# Patient Record
Sex: Female | Born: 1940 | Race: White | Hispanic: No | Marital: Married | Smoking: Never smoker
Health system: Southern US, Community
[De-identification: ages and names within clinical notes are randomized; demographics above are authoritative.]

## PROBLEM LIST (undated history)

## (undated) DIAGNOSIS — R519 Headache, unspecified: Secondary | ICD-10-CM

## (undated) DIAGNOSIS — E7800 Pure hypercholesterolemia, unspecified: Secondary | ICD-10-CM

## (undated) DIAGNOSIS — K219 Gastro-esophageal reflux disease without esophagitis: Secondary | ICD-10-CM

## (undated) DIAGNOSIS — M1990 Unspecified osteoarthritis, unspecified site: Secondary | ICD-10-CM

## (undated) DIAGNOSIS — E039 Hypothyroidism, unspecified: Secondary | ICD-10-CM

## (undated) DIAGNOSIS — E041 Nontoxic single thyroid nodule: Secondary | ICD-10-CM

## (undated) DIAGNOSIS — F419 Anxiety disorder, unspecified: Secondary | ICD-10-CM

## (undated) DIAGNOSIS — I82409 Acute embolism and thrombosis of unspecified deep veins of unspecified lower extremity: Secondary | ICD-10-CM

## (undated) DIAGNOSIS — IMO0001 Reserved for inherently not codable concepts without codable children: Secondary | ICD-10-CM

## (undated) DIAGNOSIS — R51 Headache: Secondary | ICD-10-CM

## (undated) DIAGNOSIS — J45909 Unspecified asthma, uncomplicated: Secondary | ICD-10-CM

## (undated) DIAGNOSIS — I10 Essential (primary) hypertension: Secondary | ICD-10-CM

## (undated) DIAGNOSIS — J439 Emphysema, unspecified: Secondary | ICD-10-CM

## (undated) DIAGNOSIS — G43909 Migraine, unspecified, not intractable, without status migrainosus: Secondary | ICD-10-CM

## (undated) DIAGNOSIS — G4730 Sleep apnea, unspecified: Secondary | ICD-10-CM

## (undated) DIAGNOSIS — R609 Edema, unspecified: Secondary | ICD-10-CM

## (undated) DIAGNOSIS — Z87898 Personal history of other specified conditions: Secondary | ICD-10-CM

## (undated) DIAGNOSIS — I890 Lymphedema, not elsewhere classified: Secondary | ICD-10-CM

## (undated) HISTORY — PX: OTHER SURGICAL HISTORY: SHX169

## (undated) HISTORY — DX: Emphysema, unspecified: J43.9

## (undated) HISTORY — PX: FRACTURE SURGERY: SHX138

## (undated) HISTORY — PX: ABDOMINAL HYSTERECTOMY: SHX81

## (undated) HISTORY — PX: CARDIAC CATHETERIZATION: SHX172

## (undated) HISTORY — DX: Lymphedema, not elsewhere classified: I89.0

## (undated) HISTORY — PX: OOPHORECTOMY: SHX86

## (undated) HISTORY — PX: KNEE ARTHROSCOPY: SHX127

## (undated) HISTORY — PX: BREAST BIOPSY: SHX20

---

## 2004-06-07 HISTORY — PX: ROTATOR CUFF REPAIR: SHX139

## 2004-08-05 ENCOUNTER — Inpatient Hospital Stay: Payer: Self-pay | Admitting: Unknown Physician Specialty

## 2004-08-05 ENCOUNTER — Other Ambulatory Visit: Payer: Self-pay

## 2004-10-19 ENCOUNTER — Ambulatory Visit: Payer: Self-pay | Admitting: Internal Medicine

## 2004-12-28 ENCOUNTER — Ambulatory Visit: Payer: Self-pay | Admitting: Physician Assistant

## 2005-02-01 ENCOUNTER — Ambulatory Visit: Payer: Self-pay | Admitting: Unknown Physician Specialty

## 2005-11-03 ENCOUNTER — Ambulatory Visit: Payer: Self-pay | Admitting: Internal Medicine

## 2005-11-29 ENCOUNTER — Encounter: Payer: Self-pay | Admitting: General Practice

## 2005-12-05 ENCOUNTER — Encounter: Payer: Self-pay | Admitting: General Practice

## 2006-01-05 ENCOUNTER — Encounter: Payer: Self-pay | Admitting: General Practice

## 2006-02-05 ENCOUNTER — Encounter: Payer: Self-pay | Admitting: General Practice

## 2006-03-07 ENCOUNTER — Encounter: Payer: Self-pay | Admitting: General Practice

## 2006-05-19 ENCOUNTER — Ambulatory Visit: Payer: Self-pay | Admitting: Unknown Physician Specialty

## 2006-10-14 ENCOUNTER — Encounter: Payer: Self-pay | Admitting: Internal Medicine

## 2006-11-06 ENCOUNTER — Encounter: Payer: Self-pay | Admitting: Internal Medicine

## 2006-11-22 ENCOUNTER — Ambulatory Visit: Payer: Self-pay | Admitting: Internal Medicine

## 2006-12-06 ENCOUNTER — Encounter: Payer: Self-pay | Admitting: Internal Medicine

## 2007-04-13 ENCOUNTER — Ambulatory Visit: Payer: Self-pay | Admitting: Internal Medicine

## 2007-11-23 ENCOUNTER — Ambulatory Visit: Payer: Self-pay | Admitting: Internal Medicine

## 2007-11-27 ENCOUNTER — Ambulatory Visit: Payer: Self-pay | Admitting: Unknown Physician Specialty

## 2008-03-16 ENCOUNTER — Emergency Department: Payer: Self-pay | Admitting: Internal Medicine

## 2008-03-19 ENCOUNTER — Ambulatory Visit: Payer: Self-pay | Admitting: Orthopedic Surgery

## 2008-03-21 ENCOUNTER — Ambulatory Visit: Payer: Self-pay | Admitting: Orthopedic Surgery

## 2008-03-22 ENCOUNTER — Ambulatory Visit: Payer: Self-pay | Admitting: Orthopedic Surgery

## 2008-10-31 ENCOUNTER — Ambulatory Visit: Payer: Self-pay | Admitting: Internal Medicine

## 2008-11-26 ENCOUNTER — Ambulatory Visit: Payer: Self-pay | Admitting: Internal Medicine

## 2009-11-27 ENCOUNTER — Ambulatory Visit: Payer: Self-pay | Admitting: Internal Medicine

## 2010-11-11 ENCOUNTER — Ambulatory Visit: Payer: Self-pay | Admitting: Internal Medicine

## 2010-12-17 ENCOUNTER — Ambulatory Visit: Payer: Self-pay | Admitting: Internal Medicine

## 2011-05-19 ENCOUNTER — Ambulatory Visit: Payer: Self-pay | Admitting: Internal Medicine

## 2011-07-21 ENCOUNTER — Emergency Department: Payer: Self-pay | Admitting: *Deleted

## 2011-07-21 LAB — COMPREHENSIVE METABOLIC PANEL
Albumin: 3.4 g/dL (ref 3.4–5.0)
Anion Gap: 9 (ref 7–16)
BUN: 14 mg/dL (ref 7–18)
Bilirubin,Total: 0.6 mg/dL (ref 0.2–1.0)
Co2: 28 mmol/L (ref 21–32)
Creatinine: 0.81 mg/dL (ref 0.60–1.30)
EGFR (African American): >60
EGFR (Non-African Amer.): >60
Potassium: 3.8 mmol/L (ref 3.5–5.1)
SGOT(AST): 20 U/L (ref 15–37)
Sodium: 140 mmol/L (ref 136–145)
Total Protein: 6.7 g/dL (ref 6.4–8.2)

## 2011-07-21 LAB — CBC
HGB: 14.8 g/dL (ref 12.0–16.0)
MCV: 89 fL (ref 80–100)
RBC: 4.98 10*6/uL (ref 3.80–5.20)
RDW: 13.5 % (ref 11.5–14.5)
WBC: 3.2 10*3/uL — ABNORMAL LOW (ref 3.6–11.0)

## 2011-07-21 LAB — TROPONIN I: Troponin-I: <0.02 ng/mL

## 2011-07-22 LAB — URINALYSIS, COMPLETE
Bacteria: NONE SEEN
Bilirubin,UR: NEGATIVE
Blood: NEGATIVE
Glucose,UR: NEGATIVE mg/dL (ref 0–75)
Ketone: NEGATIVE
Nitrite: NEGATIVE
Ph: 5 (ref 4.5–8.0)
Protein: NEGATIVE
RBC,UR: 2 /HPF (ref 0–5)
Specific Gravity: 1.016 (ref 1.003–1.030)
Squamous Epithelial: 1
WBC UR: 26 /HPF (ref 0–5)

## 2011-07-23 LAB — URINE CULTURE

## 2011-08-12 ENCOUNTER — Ambulatory Visit: Payer: Self-pay | Admitting: Internal Medicine

## 2011-12-21 ENCOUNTER — Ambulatory Visit: Payer: Self-pay | Admitting: Internal Medicine

## 2012-01-05 ENCOUNTER — Ambulatory Visit: Payer: Self-pay | Admitting: Surgery

## 2012-01-10 ENCOUNTER — Ambulatory Visit: Payer: Self-pay | Admitting: Surgery

## 2012-01-13 LAB — PATHOLOGY REPORT

## 2012-01-21 ENCOUNTER — Ambulatory Visit: Payer: Self-pay | Admitting: Internal Medicine

## 2012-08-01 ENCOUNTER — Ambulatory Visit: Payer: Self-pay | Admitting: Internal Medicine

## 2012-08-01 LAB — CREATININE, SERUM
Creatinine: 0.64 mg/dL (ref 0.60–1.30)
EGFR (African American): >60

## 2012-10-16 ENCOUNTER — Emergency Department: Payer: Self-pay | Admitting: Emergency Medicine

## 2012-10-16 LAB — COMPREHENSIVE METABOLIC PANEL
Albumin: 3.4 g/dL (ref 3.4–5.0)
Alkaline Phosphatase: 111 U/L (ref 50–136)
BUN: 14 mg/dL (ref 7–18)
Co2: 23 mmol/L (ref 21–32)
Creatinine: 0.85 mg/dL (ref 0.60–1.30)
Potassium: 3.6 mmol/L (ref 3.5–5.1)
SGOT(AST): 25 U/L (ref 15–37)
SGPT (ALT): 20 U/L (ref 12–78)
Total Protein: 6.4 g/dL (ref 6.4–8.2)

## 2012-10-16 LAB — TSH: Thyroid Stimulating Horm: 11.8 u[IU]/mL — ABNORMAL HIGH

## 2012-10-16 LAB — CBC
HCT: 46.1 % (ref 35.0–47.0)
MCH: 30.5 pg (ref 26.0–34.0)
MCV: 89 fL (ref 80–100)
Platelet: 210 10*3/uL (ref 150–440)
RBC: 5.21 10*6/uL — ABNORMAL HIGH (ref 3.80–5.20)
WBC: 10.2 10*3/uL (ref 3.6–11.0)

## 2012-10-16 LAB — CK TOTAL AND CKMB (NOT AT ARMC): CK-MB: 1.4 ng/mL (ref 0.5–3.6)

## 2012-10-16 LAB — T4, FREE: Free Thyroxine: 0.98 ng/dL (ref 0.76–1.46)

## 2012-11-20 ENCOUNTER — Ambulatory Visit: Payer: Self-pay | Admitting: Cardiology

## 2012-12-21 ENCOUNTER — Ambulatory Visit: Payer: Self-pay | Admitting: Internal Medicine

## 2013-06-04 ENCOUNTER — Ambulatory Visit: Payer: Self-pay | Admitting: Unknown Physician Specialty

## 2013-09-13 DIAGNOSIS — E7800 Pure hypercholesterolemia, unspecified: Secondary | ICD-10-CM | POA: Insufficient documentation

## 2013-09-13 DIAGNOSIS — M542 Cervicalgia: Secondary | ICD-10-CM | POA: Insufficient documentation

## 2013-09-13 DIAGNOSIS — M719 Bursopathy, unspecified: Secondary | ICD-10-CM | POA: Insufficient documentation

## 2013-09-13 DIAGNOSIS — M23329 Other meniscus derangements, posterior horn of medial meniscus, unspecified knee: Secondary | ICD-10-CM | POA: Insufficient documentation

## 2013-09-25 ENCOUNTER — Ambulatory Visit: Payer: Self-pay | Admitting: Specialist

## 2013-12-24 ENCOUNTER — Ambulatory Visit: Payer: Self-pay | Admitting: Internal Medicine

## 2013-12-31 DIAGNOSIS — R4701 Aphasia: Secondary | ICD-10-CM | POA: Insufficient documentation

## 2013-12-31 DIAGNOSIS — F419 Anxiety disorder, unspecified: Secondary | ICD-10-CM | POA: Insufficient documentation

## 2013-12-31 DIAGNOSIS — E669 Obesity, unspecified: Secondary | ICD-10-CM | POA: Insufficient documentation

## 2014-06-10 DIAGNOSIS — R079 Chest pain, unspecified: Secondary | ICD-10-CM | POA: Diagnosis not present

## 2014-06-14 DIAGNOSIS — I8310 Varicose veins of unspecified lower extremity with inflammation: Secondary | ICD-10-CM | POA: Diagnosis not present

## 2014-06-14 DIAGNOSIS — M79609 Pain in unspecified limb: Secondary | ICD-10-CM | POA: Diagnosis not present

## 2014-06-14 DIAGNOSIS — M7989 Other specified soft tissue disorders: Secondary | ICD-10-CM | POA: Diagnosis not present

## 2014-06-14 DIAGNOSIS — M549 Dorsalgia, unspecified: Secondary | ICD-10-CM | POA: Diagnosis not present

## 2014-06-17 DIAGNOSIS — M65811 Other synovitis and tenosynovitis, right shoulder: Secondary | ICD-10-CM | POA: Diagnosis not present

## 2014-06-21 DIAGNOSIS — M65811 Other synovitis and tenosynovitis, right shoulder: Secondary | ICD-10-CM | POA: Diagnosis not present

## 2014-06-24 DIAGNOSIS — E780 Pure hypercholesterolemia: Secondary | ICD-10-CM | POA: Diagnosis not present

## 2014-06-24 DIAGNOSIS — Z79899 Other long term (current) drug therapy: Secondary | ICD-10-CM | POA: Diagnosis not present

## 2014-06-24 DIAGNOSIS — Z8639 Personal history of other endocrine, nutritional and metabolic disease: Secondary | ICD-10-CM | POA: Diagnosis not present

## 2014-06-24 DIAGNOSIS — E538 Deficiency of other specified B group vitamins: Secondary | ICD-10-CM | POA: Diagnosis not present

## 2014-06-24 DIAGNOSIS — I10 Essential (primary) hypertension: Secondary | ICD-10-CM | POA: Diagnosis not present

## 2014-06-25 DIAGNOSIS — M65811 Other synovitis and tenosynovitis, right shoulder: Secondary | ICD-10-CM | POA: Diagnosis not present

## 2014-06-27 DIAGNOSIS — M65811 Other synovitis and tenosynovitis, right shoulder: Secondary | ICD-10-CM | POA: Diagnosis not present

## 2014-07-04 DIAGNOSIS — M65811 Other synovitis and tenosynovitis, right shoulder: Secondary | ICD-10-CM | POA: Diagnosis not present

## 2014-07-05 DIAGNOSIS — M65811 Other synovitis and tenosynovitis, right shoulder: Secondary | ICD-10-CM | POA: Diagnosis not present

## 2014-07-05 DIAGNOSIS — M7711 Lateral epicondylitis, right elbow: Secondary | ICD-10-CM | POA: Diagnosis not present

## 2014-07-08 DIAGNOSIS — M65811 Other synovitis and tenosynovitis, right shoulder: Secondary | ICD-10-CM | POA: Diagnosis not present

## 2014-07-11 DIAGNOSIS — M65811 Other synovitis and tenosynovitis, right shoulder: Secondary | ICD-10-CM | POA: Diagnosis not present

## 2014-07-16 DIAGNOSIS — M65811 Other synovitis and tenosynovitis, right shoulder: Secondary | ICD-10-CM | POA: Diagnosis not present

## 2014-07-18 DIAGNOSIS — M65811 Other synovitis and tenosynovitis, right shoulder: Secondary | ICD-10-CM | POA: Diagnosis not present

## 2014-07-26 DIAGNOSIS — M65811 Other synovitis and tenosynovitis, right shoulder: Secondary | ICD-10-CM | POA: Diagnosis not present

## 2014-08-14 DIAGNOSIS — G4733 Obstructive sleep apnea (adult) (pediatric): Secondary | ICD-10-CM | POA: Diagnosis not present

## 2014-08-14 DIAGNOSIS — Z8709 Personal history of other diseases of the respiratory system: Secondary | ICD-10-CM | POA: Diagnosis not present

## 2014-08-15 DIAGNOSIS — M65811 Other synovitis and tenosynovitis, right shoulder: Secondary | ICD-10-CM | POA: Diagnosis not present

## 2014-09-03 DIAGNOSIS — I10 Essential (primary) hypertension: Secondary | ICD-10-CM | POA: Diagnosis not present

## 2014-09-03 DIAGNOSIS — E780 Pure hypercholesterolemia: Secondary | ICD-10-CM | POA: Diagnosis not present

## 2014-09-03 DIAGNOSIS — R002 Palpitations: Secondary | ICD-10-CM | POA: Diagnosis not present

## 2014-09-03 DIAGNOSIS — R079 Chest pain, unspecified: Secondary | ICD-10-CM | POA: Diagnosis not present

## 2014-09-12 DIAGNOSIS — R002 Palpitations: Secondary | ICD-10-CM | POA: Diagnosis not present

## 2014-09-24 NOTE — Op Note (Signed)
PATIENT NAME:  Lindsey Thomas, Lindsey Thomas MR#:  [id number] DATE OF BIRTH:  Apr 23, 1941  DATE OF PROCEDURE:  01/10/2012  PREOPERATIVE DIAGNOSIS: Ventral hernia.   POSTOPERATIVE DIAGNOSIS: Abdominal wall lipoma.   PROCEDURE: Excision of abdominal wall lipoma.  SURGEON: Loreli Dollar, MD    ANESTHESIA: General.   INDICATIONS: This 74 year old female had a history of a painful bulge in the epigastrium which has been bothering her for about a year. It particularly hurts when she bends over. She has been able to feel a tender lump in the midepigastrium. On physical exam she did have findings of a palpable mass about 2.5 cm in dimension which was tender and not reducible and appeared that it was an incarcerated ventral hernia.  DESCRIPTION OF PROCEDURE: The patient was placed on the operating table in the supine position under general anesthesia. The abdomen was prepared with ChloraPrep and draped in a sterile manner.   A longitudinally oriented incision was made in the midaspect of the epigastrium and carried down through subcutaneous tissues. This incision was approximately 3 cm in length and encountered a fatty mass which was dissected free from surrounding structures, had somewhat ill-defined margins, and was dissected away from surrounding structures. It did extend down to the deep fascia and was excised. There was no palpable or visible hernia at this site but just appeared that the mass was likely a lipoma. The wound was inspected. Several small bleeding points were cauterized. The skin was then closed with running subcuticular 5-0 Monocryl running suture and Dermabond. The patient tolerated surgery satisfactorily and was then prepared for transfer to the recovery room.   ____________________________ Lenna Sciara. Rochel Brome, MD jws:drc D: 01/10/2012 08:25:36 ET T: 01/10/2012 08:41:50 ET JOB#: [id number] Loreli Dollar MD ELECTRONICALLY SIGNED 01/12/2012 8:57

## 2014-10-14 DIAGNOSIS — I10 Essential (primary) hypertension: Secondary | ICD-10-CM | POA: Diagnosis not present

## 2014-10-14 DIAGNOSIS — E780 Pure hypercholesterolemia: Secondary | ICD-10-CM | POA: Diagnosis not present

## 2014-11-22 DIAGNOSIS — H269 Unspecified cataract: Secondary | ICD-10-CM | POA: Diagnosis not present

## 2014-12-05 ENCOUNTER — Other Ambulatory Visit: Payer: Self-pay | Admitting: Internal Medicine

## 2014-12-05 DIAGNOSIS — E780 Pure hypercholesterolemia: Secondary | ICD-10-CM | POA: Diagnosis not present

## 2014-12-05 DIAGNOSIS — Z1231 Encounter for screening mammogram for malignant neoplasm of breast: Secondary | ICD-10-CM

## 2014-12-05 DIAGNOSIS — I10 Essential (primary) hypertension: Secondary | ICD-10-CM | POA: Diagnosis not present

## 2014-12-05 DIAGNOSIS — M79671 Pain in right foot: Secondary | ICD-10-CM | POA: Diagnosis not present

## 2014-12-05 DIAGNOSIS — Z0000 Encounter for general adult medical examination without abnormal findings: Secondary | ICD-10-CM | POA: Diagnosis not present

## 2014-12-11 DIAGNOSIS — M898X9 Other specified disorders of bone, unspecified site: Secondary | ICD-10-CM | POA: Diagnosis not present

## 2014-12-11 DIAGNOSIS — M216X2 Other acquired deformities of left foot: Secondary | ICD-10-CM | POA: Diagnosis not present

## 2014-12-11 DIAGNOSIS — M65872 Other synovitis and tenosynovitis, left ankle and foot: Secondary | ICD-10-CM | POA: Diagnosis not present

## 2014-12-11 DIAGNOSIS — M2012 Hallux valgus (acquired), left foot: Secondary | ICD-10-CM | POA: Diagnosis not present

## 2014-12-24 DIAGNOSIS — Z872 Personal history of diseases of the skin and subcutaneous tissue: Secondary | ICD-10-CM | POA: Diagnosis not present

## 2014-12-24 DIAGNOSIS — L821 Other seborrheic keratosis: Secondary | ICD-10-CM | POA: Diagnosis not present

## 2014-12-24 DIAGNOSIS — M898X9 Other specified disorders of bone, unspecified site: Secondary | ICD-10-CM | POA: Diagnosis not present

## 2014-12-24 DIAGNOSIS — Z1283 Encounter for screening for malignant neoplasm of skin: Secondary | ICD-10-CM | POA: Diagnosis not present

## 2014-12-24 DIAGNOSIS — M2012 Hallux valgus (acquired), left foot: Secondary | ICD-10-CM | POA: Diagnosis not present

## 2014-12-26 ENCOUNTER — Other Ambulatory Visit: Payer: Self-pay | Admitting: Internal Medicine

## 2014-12-26 ENCOUNTER — Ambulatory Visit
Admission: RE | Admit: 2014-12-26 | Discharge: 2014-12-26 | Disposition: A | Discharge status: Home / Self Care | Payer: Commercial Managed Care - HMO | Source: Ambulatory Visit | Attending: Internal Medicine | Admitting: Internal Medicine

## 2014-12-26 DIAGNOSIS — R922 Inconclusive mammogram: Secondary | ICD-10-CM | POA: Insufficient documentation

## 2014-12-26 DIAGNOSIS — Z1231 Encounter for screening mammogram for malignant neoplasm of breast: Secondary | ICD-10-CM | POA: Diagnosis not present

## 2014-12-26 DIAGNOSIS — Z13820 Encounter for screening for osteoporosis: Secondary | ICD-10-CM | POA: Diagnosis not present

## 2014-12-27 ENCOUNTER — Other Ambulatory Visit: Payer: Self-pay | Admitting: Internal Medicine

## 2014-12-27 DIAGNOSIS — Z1329 Encounter for screening for other suspected endocrine disorder: Secondary | ICD-10-CM | POA: Diagnosis not present

## 2014-12-27 DIAGNOSIS — E538 Deficiency of other specified B group vitamins: Secondary | ICD-10-CM | POA: Diagnosis not present

## 2014-12-27 DIAGNOSIS — Z79899 Other long term (current) drug therapy: Secondary | ICD-10-CM | POA: Diagnosis not present

## 2014-12-27 DIAGNOSIS — R928 Other abnormal and inconclusive findings on diagnostic imaging of breast: Secondary | ICD-10-CM

## 2014-12-27 DIAGNOSIS — N6310 Unspecified lump in the right breast, unspecified quadrant: Secondary | ICD-10-CM

## 2014-12-27 DIAGNOSIS — I10 Essential (primary) hypertension: Secondary | ICD-10-CM | POA: Diagnosis not present

## 2014-12-27 DIAGNOSIS — E559 Vitamin D deficiency, unspecified: Secondary | ICD-10-CM | POA: Diagnosis not present

## 2014-12-27 DIAGNOSIS — E780 Pure hypercholesterolemia: Secondary | ICD-10-CM | POA: Diagnosis not present

## 2015-01-01 ENCOUNTER — Ambulatory Visit
Admission: RE | Admit: 2015-01-01 | Discharge: 2015-01-01 | Disposition: A | Discharge status: Home / Self Care | Payer: Commercial Managed Care - HMO | Source: Ambulatory Visit | Attending: Internal Medicine | Admitting: Internal Medicine

## 2015-01-01 ENCOUNTER — Ambulatory Visit: Payer: Commercial Managed Care - HMO

## 2015-01-01 DIAGNOSIS — N63 Unspecified lump in breast: Secondary | ICD-10-CM | POA: Insufficient documentation

## 2015-01-01 DIAGNOSIS — R928 Other abnormal and inconclusive findings on diagnostic imaging of breast: Secondary | ICD-10-CM

## 2015-01-01 DIAGNOSIS — N6310 Unspecified lump in the right breast, unspecified quadrant: Secondary | ICD-10-CM

## 2015-01-22 DIAGNOSIS — G4733 Obstructive sleep apnea (adult) (pediatric): Secondary | ICD-10-CM | POA: Diagnosis not present

## 2015-01-22 DIAGNOSIS — J4520 Mild intermittent asthma, uncomplicated: Secondary | ICD-10-CM | POA: Diagnosis not present

## 2015-04-14 DIAGNOSIS — E041 Nontoxic single thyroid nodule: Secondary | ICD-10-CM | POA: Diagnosis not present

## 2015-04-16 DIAGNOSIS — I10 Essential (primary) hypertension: Secondary | ICD-10-CM | POA: Diagnosis not present

## 2015-04-16 DIAGNOSIS — E7800 Pure hypercholesterolemia, unspecified: Secondary | ICD-10-CM | POA: Diagnosis not present

## 2015-04-21 DIAGNOSIS — E041 Nontoxic single thyroid nodule: Secondary | ICD-10-CM | POA: Diagnosis not present

## 2015-06-25 DIAGNOSIS — M5442 Lumbago with sciatica, left side: Secondary | ICD-10-CM | POA: Diagnosis not present

## 2015-06-25 DIAGNOSIS — E041 Nontoxic single thyroid nodule: Secondary | ICD-10-CM | POA: Insufficient documentation

## 2015-06-25 DIAGNOSIS — Z79899 Other long term (current) drug therapy: Secondary | ICD-10-CM | POA: Diagnosis not present

## 2015-06-25 DIAGNOSIS — E7800 Pure hypercholesterolemia, unspecified: Secondary | ICD-10-CM | POA: Diagnosis not present

## 2015-06-25 DIAGNOSIS — F419 Anxiety disorder, unspecified: Secondary | ICD-10-CM | POA: Diagnosis not present

## 2015-06-25 DIAGNOSIS — M545 Low back pain: Secondary | ICD-10-CM | POA: Diagnosis not present

## 2015-06-25 DIAGNOSIS — R0602 Shortness of breath: Secondary | ICD-10-CM | POA: Diagnosis not present

## 2015-06-25 DIAGNOSIS — R51 Headache: Secondary | ICD-10-CM | POA: Diagnosis not present

## 2015-06-25 DIAGNOSIS — J019 Acute sinusitis, unspecified: Secondary | ICD-10-CM | POA: Diagnosis not present

## 2015-06-25 DIAGNOSIS — I10 Essential (primary) hypertension: Secondary | ICD-10-CM | POA: Diagnosis not present

## 2015-07-03 DIAGNOSIS — R0602 Shortness of breath: Secondary | ICD-10-CM | POA: Diagnosis not present

## 2015-08-21 DIAGNOSIS — G4733 Obstructive sleep apnea (adult) (pediatric): Secondary | ICD-10-CM | POA: Diagnosis not present

## 2015-08-21 DIAGNOSIS — E669 Obesity, unspecified: Secondary | ICD-10-CM | POA: Diagnosis not present

## 2015-10-13 DIAGNOSIS — F419 Anxiety disorder, unspecified: Secondary | ICD-10-CM | POA: Diagnosis not present

## 2015-10-13 DIAGNOSIS — E7800 Pure hypercholesterolemia, unspecified: Secondary | ICD-10-CM | POA: Diagnosis not present

## 2015-10-13 DIAGNOSIS — I10 Essential (primary) hypertension: Secondary | ICD-10-CM | POA: Diagnosis not present

## 2015-10-14 ENCOUNTER — Other Ambulatory Visit: Payer: Self-pay | Admitting: Internal Medicine

## 2015-10-14 DIAGNOSIS — Z87448 Personal history of other diseases of urinary system: Secondary | ICD-10-CM | POA: Diagnosis not present

## 2015-10-14 DIAGNOSIS — M5030 Other cervical disc degeneration, unspecified cervical region: Secondary | ICD-10-CM | POA: Diagnosis not present

## 2015-10-14 DIAGNOSIS — R600 Localized edema: Secondary | ICD-10-CM | POA: Diagnosis not present

## 2015-10-14 DIAGNOSIS — R109 Unspecified abdominal pain: Secondary | ICD-10-CM

## 2015-10-14 DIAGNOSIS — Z79899 Other long term (current) drug therapy: Secondary | ICD-10-CM | POA: Diagnosis not present

## 2015-10-14 DIAGNOSIS — R200 Anesthesia of skin: Secondary | ICD-10-CM | POA: Diagnosis not present

## 2015-10-16 ENCOUNTER — Other Ambulatory Visit: Payer: Self-pay | Admitting: Internal Medicine

## 2015-10-16 DIAGNOSIS — M5030 Other cervical disc degeneration, unspecified cervical region: Secondary | ICD-10-CM

## 2015-10-23 ENCOUNTER — Ambulatory Visit
Admission: RE | Admit: 2015-10-23 | Discharge: 2015-10-23 | Disposition: A | Discharge status: Home / Self Care | Payer: Commercial Managed Care - HMO | Source: Ambulatory Visit | Attending: Internal Medicine | Admitting: Internal Medicine

## 2015-10-23 DIAGNOSIS — R109 Unspecified abdominal pain: Secondary | ICD-10-CM | POA: Insufficient documentation

## 2015-10-27 DIAGNOSIS — I6523 Occlusion and stenosis of bilateral carotid arteries: Secondary | ICD-10-CM | POA: Diagnosis not present

## 2015-10-27 DIAGNOSIS — R200 Anesthesia of skin: Secondary | ICD-10-CM | POA: Diagnosis not present

## 2015-10-29 ENCOUNTER — Ambulatory Visit
Admission: RE | Admit: 2015-10-29 | Discharge: 2015-10-29 | Disposition: A | Discharge status: Home / Self Care | Payer: Commercial Managed Care - HMO | Source: Ambulatory Visit | Attending: Internal Medicine | Admitting: Internal Medicine

## 2015-10-29 ENCOUNTER — Other Ambulatory Visit: Payer: Self-pay | Admitting: Internal Medicine

## 2015-10-29 DIAGNOSIS — M79605 Pain in left leg: Secondary | ICD-10-CM | POA: Diagnosis not present

## 2015-10-29 DIAGNOSIS — R600 Localized edema: Secondary | ICD-10-CM | POA: Diagnosis not present

## 2015-10-29 DIAGNOSIS — M179 Osteoarthritis of knee, unspecified: Secondary | ICD-10-CM | POA: Diagnosis not present

## 2015-10-29 DIAGNOSIS — M7989 Other specified soft tissue disorders: Secondary | ICD-10-CM | POA: Diagnosis not present

## 2015-11-06 DIAGNOSIS — M79605 Pain in left leg: Secondary | ICD-10-CM | POA: Diagnosis not present

## 2015-11-06 DIAGNOSIS — R600 Localized edema: Secondary | ICD-10-CM | POA: Diagnosis not present

## 2015-11-07 ENCOUNTER — Ambulatory Visit
Admission: RE | Admit: 2015-11-07 | Discharge: 2015-11-07 | Disposition: A | Discharge status: Home / Self Care | Payer: Commercial Managed Care - HMO | Source: Ambulatory Visit | Attending: Internal Medicine | Admitting: Internal Medicine

## 2015-11-07 DIAGNOSIS — M47812 Spondylosis without myelopathy or radiculopathy, cervical region: Secondary | ICD-10-CM | POA: Insufficient documentation

## 2015-11-07 DIAGNOSIS — M5030 Other cervical disc degeneration, unspecified cervical region: Secondary | ICD-10-CM | POA: Diagnosis not present

## 2015-11-07 DIAGNOSIS — M50222 Other cervical disc displacement at C5-C6 level: Secondary | ICD-10-CM | POA: Diagnosis not present

## 2015-11-07 MED ORDER — GADOBENATE DIMEGLUMINE 529 MG/ML IV SOLN
15.0000 mL | Freq: Once | INTRAVENOUS | Status: AC | PRN
  Administered 2015-11-07: 15 mL via INTRAVENOUS

## 2015-11-14 DIAGNOSIS — R600 Localized edema: Secondary | ICD-10-CM | POA: Diagnosis not present

## 2015-11-14 DIAGNOSIS — M79605 Pain in left leg: Secondary | ICD-10-CM | POA: Diagnosis not present

## 2015-11-18 DIAGNOSIS — R600 Localized edema: Secondary | ICD-10-CM | POA: Diagnosis not present

## 2015-11-18 DIAGNOSIS — M79605 Pain in left leg: Secondary | ICD-10-CM | POA: Diagnosis not present

## 2015-12-01 DIAGNOSIS — H2513 Age-related nuclear cataract, bilateral: Secondary | ICD-10-CM | POA: Diagnosis not present

## 2015-12-23 ENCOUNTER — Other Ambulatory Visit: Payer: Self-pay | Admitting: Internal Medicine

## 2015-12-23 DIAGNOSIS — I878 Other specified disorders of veins: Secondary | ICD-10-CM | POA: Insufficient documentation

## 2015-12-23 DIAGNOSIS — Z1239 Encounter for other screening for malignant neoplasm of breast: Secondary | ICD-10-CM | POA: Diagnosis not present

## 2015-12-23 DIAGNOSIS — Z131 Encounter for screening for diabetes mellitus: Secondary | ICD-10-CM | POA: Diagnosis not present

## 2015-12-23 DIAGNOSIS — E041 Nontoxic single thyroid nodule: Secondary | ICD-10-CM | POA: Diagnosis not present

## 2015-12-23 DIAGNOSIS — E6609 Other obesity due to excess calories: Secondary | ICD-10-CM | POA: Diagnosis not present

## 2015-12-23 DIAGNOSIS — Z79899 Other long term (current) drug therapy: Secondary | ICD-10-CM | POA: Diagnosis not present

## 2015-12-23 DIAGNOSIS — Z1231 Encounter for screening mammogram for malignant neoplasm of breast: Secondary | ICD-10-CM

## 2015-12-23 DIAGNOSIS — E7800 Pure hypercholesterolemia, unspecified: Secondary | ICD-10-CM | POA: Diagnosis not present

## 2015-12-23 DIAGNOSIS — I10 Essential (primary) hypertension: Secondary | ICD-10-CM | POA: Diagnosis not present

## 2015-12-23 DIAGNOSIS — Z0000 Encounter for general adult medical examination without abnormal findings: Secondary | ICD-10-CM | POA: Diagnosis not present

## 2015-12-25 DIAGNOSIS — E041 Nontoxic single thyroid nodule: Secondary | ICD-10-CM | POA: Diagnosis not present

## 2015-12-25 DIAGNOSIS — E7800 Pure hypercholesterolemia, unspecified: Secondary | ICD-10-CM | POA: Diagnosis not present

## 2015-12-25 DIAGNOSIS — I10 Essential (primary) hypertension: Secondary | ICD-10-CM | POA: Diagnosis not present

## 2015-12-25 DIAGNOSIS — Z131 Encounter for screening for diabetes mellitus: Secondary | ICD-10-CM | POA: Diagnosis not present

## 2015-12-25 DIAGNOSIS — Z79899 Other long term (current) drug therapy: Secondary | ICD-10-CM | POA: Diagnosis not present

## 2015-12-26 DIAGNOSIS — M549 Dorsalgia, unspecified: Secondary | ICD-10-CM | POA: Diagnosis not present

## 2015-12-26 DIAGNOSIS — M79609 Pain in unspecified limb: Secondary | ICD-10-CM | POA: Diagnosis not present

## 2015-12-26 DIAGNOSIS — M7989 Other specified soft tissue disorders: Secondary | ICD-10-CM | POA: Diagnosis not present

## 2015-12-26 DIAGNOSIS — I8310 Varicose veins of unspecified lower extremity with inflammation: Secondary | ICD-10-CM | POA: Diagnosis not present

## 2015-12-26 DIAGNOSIS — I890 Lymphedema, not elsewhere classified: Secondary | ICD-10-CM | POA: Diagnosis not present

## 2015-12-30 DIAGNOSIS — L821 Other seborrheic keratosis: Secondary | ICD-10-CM | POA: Diagnosis not present

## 2015-12-30 DIAGNOSIS — Z1283 Encounter for screening for malignant neoplasm of skin: Secondary | ICD-10-CM | POA: Diagnosis not present

## 2015-12-30 DIAGNOSIS — Z872 Personal history of diseases of the skin and subcutaneous tissue: Secondary | ICD-10-CM | POA: Diagnosis not present

## 2016-01-06 ENCOUNTER — Ambulatory Visit
Admission: RE | Admit: 2016-01-06 | Discharge: 2016-01-06 | Disposition: A | Discharge status: Home / Self Care | Payer: Commercial Managed Care - HMO | Source: Ambulatory Visit | Attending: Internal Medicine | Admitting: Internal Medicine

## 2016-01-06 ENCOUNTER — Other Ambulatory Visit: Payer: Self-pay | Admitting: Internal Medicine

## 2016-01-06 DIAGNOSIS — Z1231 Encounter for screening mammogram for malignant neoplasm of breast: Secondary | ICD-10-CM

## 2016-01-21 DIAGNOSIS — I872 Venous insufficiency (chronic) (peripheral): Secondary | ICD-10-CM | POA: Diagnosis not present

## 2016-01-21 DIAGNOSIS — I8310 Varicose veins of unspecified lower extremity with inflammation: Secondary | ICD-10-CM | POA: Diagnosis not present

## 2016-01-21 DIAGNOSIS — M79609 Pain in unspecified limb: Secondary | ICD-10-CM | POA: Diagnosis not present

## 2016-01-21 DIAGNOSIS — M7989 Other specified soft tissue disorders: Secondary | ICD-10-CM | POA: Diagnosis not present

## 2016-01-21 DIAGNOSIS — I890 Lymphedema, not elsewhere classified: Secondary | ICD-10-CM | POA: Diagnosis not present

## 2016-01-21 DIAGNOSIS — M549 Dorsalgia, unspecified: Secondary | ICD-10-CM | POA: Diagnosis not present

## 2016-01-23 DIAGNOSIS — H2513 Age-related nuclear cataract, bilateral: Secondary | ICD-10-CM | POA: Diagnosis not present

## 2016-02-03 ENCOUNTER — Encounter: Payer: Self-pay | Admitting: *Deleted

## 2016-02-04 DIAGNOSIS — I890 Lymphedema, not elsewhere classified: Secondary | ICD-10-CM | POA: Diagnosis not present

## 2016-02-12 ENCOUNTER — Ambulatory Visit: Payer: Commercial Managed Care - HMO | Admitting: Anesthesiology

## 2016-02-12 ENCOUNTER — Encounter: Payer: Self-pay | Admitting: Anesthesiology

## 2016-02-12 ENCOUNTER — Ambulatory Visit
Admission: RE | Admit: 2016-02-12 | Discharge: 2016-02-12 | Disposition: A | Discharge status: Home / Self Care | Payer: Commercial Managed Care - HMO | Source: Ambulatory Visit | Attending: Ophthalmology | Admitting: Ophthalmology

## 2016-02-12 ENCOUNTER — Encounter
Admission: RE | Disposition: A | Discharge status: Home / Self Care | Payer: Self-pay | Source: Ambulatory Visit | Attending: Ophthalmology

## 2016-02-12 DIAGNOSIS — F419 Anxiety disorder, unspecified: Secondary | ICD-10-CM | POA: Insufficient documentation

## 2016-02-12 DIAGNOSIS — Z888 Allergy status to other drugs, medicaments and biological substances status: Secondary | ICD-10-CM | POA: Insufficient documentation

## 2016-02-12 DIAGNOSIS — G43909 Migraine, unspecified, not intractable, without status migrainosus: Secondary | ICD-10-CM | POA: Diagnosis not present

## 2016-02-12 DIAGNOSIS — J45909 Unspecified asthma, uncomplicated: Secondary | ICD-10-CM | POA: Insufficient documentation

## 2016-02-12 DIAGNOSIS — K219 Gastro-esophageal reflux disease without esophagitis: Secondary | ICD-10-CM | POA: Insufficient documentation

## 2016-02-12 DIAGNOSIS — H2512 Age-related nuclear cataract, left eye: Secondary | ICD-10-CM | POA: Insufficient documentation

## 2016-02-12 DIAGNOSIS — R0601 Orthopnea: Secondary | ICD-10-CM | POA: Diagnosis not present

## 2016-02-12 DIAGNOSIS — Z90710 Acquired absence of both cervix and uterus: Secondary | ICD-10-CM | POA: Insufficient documentation

## 2016-02-12 DIAGNOSIS — E041 Nontoxic single thyroid nodule: Secondary | ICD-10-CM | POA: Insufficient documentation

## 2016-02-12 DIAGNOSIS — Z881 Allergy status to other antibiotic agents status: Secondary | ICD-10-CM | POA: Insufficient documentation

## 2016-02-12 DIAGNOSIS — M7989 Other specified soft tissue disorders: Secondary | ICD-10-CM | POA: Diagnosis not present

## 2016-02-12 DIAGNOSIS — M1990 Unspecified osteoarthritis, unspecified site: Secondary | ICD-10-CM | POA: Diagnosis not present

## 2016-02-12 DIAGNOSIS — I10 Essential (primary) hypertension: Secondary | ICD-10-CM | POA: Diagnosis not present

## 2016-02-12 DIAGNOSIS — E7800 Pure hypercholesterolemia, unspecified: Secondary | ICD-10-CM | POA: Diagnosis not present

## 2016-02-12 DIAGNOSIS — Z885 Allergy status to narcotic agent status: Secondary | ICD-10-CM | POA: Insufficient documentation

## 2016-02-12 DIAGNOSIS — R0681 Apnea, not elsewhere classified: Secondary | ICD-10-CM | POA: Insufficient documentation

## 2016-02-12 HISTORY — DX: Sleep apnea, unspecified: G47.30

## 2016-02-12 HISTORY — DX: Headache, unspecified: R51.9

## 2016-02-12 HISTORY — DX: Edema, unspecified: R60.9

## 2016-02-12 HISTORY — DX: Hypothyroidism, unspecified: E03.9

## 2016-02-12 HISTORY — DX: Anxiety disorder, unspecified: F41.9

## 2016-02-12 HISTORY — DX: Essential (primary) hypertension: I10

## 2016-02-12 HISTORY — PX: CATARACT EXTRACTION W/PHACO: SHX586

## 2016-02-12 HISTORY — DX: Unspecified asthma, uncomplicated: J45.909

## 2016-02-12 HISTORY — DX: Reserved for inherently not codable concepts without codable children: IMO0001

## 2016-02-12 HISTORY — DX: Gastro-esophageal reflux disease without esophagitis: K21.9

## 2016-02-12 HISTORY — DX: Headache: R51

## 2016-02-12 HISTORY — DX: Unspecified osteoarthritis, unspecified site: M19.90

## 2016-02-12 HISTORY — DX: Personal history of other specified conditions: Z87.898

## 2016-02-12 SURGERY — PHACOEMULSIFICATION, CATARACT, WITH IOL INSERTION
Anesthesia: Monitor Anesthesia Care | Site: Eye | Laterality: Left | Wound class: Clean

## 2016-02-12 MED ORDER — LIDOCAINE HCL (PF) 4 % IJ SOLN
INTRAMUSCULAR | Status: AC
  Filled 2016-02-12: qty 30

## 2016-02-12 MED ORDER — SODIUM HYALURONATE 10 MG/ML IO SOLN
INTRAOCULAR | Status: DC | PRN
  Administered 2016-02-12: .85 mL via INTRAOCULAR

## 2016-02-12 MED ORDER — LIDOCAINE HCL (PF) 4 % IJ SOLN
INTRAOCULAR | Status: DC | PRN
  Administered 2016-02-12: .5 mL via OPHTHALMIC

## 2016-02-12 MED ORDER — ARMC OPHTHALMIC DILATING GEL
OPHTHALMIC | Status: AC
  Administered 2016-02-12: 1 via OPHTHALMIC
  Filled 2016-02-12: qty 0.25

## 2016-02-12 MED ORDER — TETRACAINE HCL 0.5 % OP SOLN
OPHTHALMIC | Status: AC
  Administered 2016-02-12: 1 [drp] via OPHTHALMIC
  Filled 2016-02-12: qty 2

## 2016-02-12 MED ORDER — SODIUM HYALURONATE 23 MG/ML IO SOLN
INTRAOCULAR | Status: AC
Start: 2016-02-12 — End: 2016-02-12
  Filled 2016-02-12: qty 3.6

## 2016-02-12 MED ORDER — SODIUM CHLORIDE 0.9 % IV SOLN
INTRAVENOUS | Status: DC
  Administered 2016-02-12: 07:00:00 via INTRAVENOUS

## 2016-02-12 MED ORDER — SODIUM HYALURONATE 23 MG/ML IO SOLN
INTRAOCULAR | Status: DC | PRN
  Administered 2016-02-12: .6 mL via INTRAOCULAR

## 2016-02-12 MED ORDER — POVIDONE-IODINE 5 % OP SOLN
OPHTHALMIC | Status: AC
  Administered 2016-02-12: 1 via OPHTHALMIC
  Filled 2016-02-12: qty 30

## 2016-02-12 MED ORDER — EPINEPHRINE HCL 1 MG/ML IJ SOLN
INTRAOCULAR | Status: DC | PRN
  Administered 2016-02-12: 1 mL via OPHTHALMIC

## 2016-02-12 MED ORDER — MOXIFLOXACIN HCL 0.5 % OP SOLN
OPHTHALMIC | Status: DC | PRN
  Administered 2016-02-12: 1 [drp] via OPHTHALMIC

## 2016-02-12 MED ORDER — ARMC OPHTHALMIC DILATING GEL
1.0000 "application " | OPHTHALMIC | Status: AC | PRN
  Administered 2016-02-12 (×2): 1 via OPHTHALMIC

## 2016-02-12 MED ORDER — MOXIFLOXACIN HCL 0.5 % OP SOLN: 1.0000 [drp] | OPHTHALMIC | Status: DC | PRN

## 2016-02-12 MED ORDER — EPINEPHRINE HCL 1 MG/ML IJ SOLN
INTRAMUSCULAR | Status: AC
  Filled 2016-02-12: qty 12

## 2016-02-12 MED ORDER — MOXIFLOXACIN HCL 0.5 % OP SOLN
OPHTHALMIC | Status: DC
Start: 2016-02-12 — End: 2016-02-12
  Filled 2016-02-12: qty 3

## 2016-02-12 MED ORDER — POVIDONE-IODINE 5 % OP SOLN
1.0000 "application " | Freq: Once | OPHTHALMIC | Status: AC
  Administered 2016-02-12: 1 via OPHTHALMIC

## 2016-02-12 MED ORDER — TETRACAINE HCL 0.5 % OP SOLN
1.0000 [drp] | Freq: Once | OPHTHALMIC | Status: AC
  Administered 2016-02-12: 1 [drp] via OPHTHALMIC

## 2016-02-12 MED ORDER — CARBACHOL 0.01 % IO SOLN
INTRAOCULAR | Status: DC | PRN
  Administered 2016-02-12: .5 mL via INTRAOCULAR

## 2016-02-12 MED ORDER — MIDAZOLAM HCL 2 MG/2ML IJ SOLN
INTRAMUSCULAR | Status: DC | PRN
  Administered 2016-02-12: 1 mg via INTRAVENOUS

## 2016-02-12 MED ORDER — SODIUM HYALURONATE 10 MG/ML IO SOLN
INTRAOCULAR | Status: AC
  Filled 2016-02-12: qty 5.1

## 2016-02-12 SURGICAL SUPPLY — 22 items
CANNULA ANT/CHMB 27GA (MISCELLANEOUS) ×6 IMPLANT
CUP MEDICINE 2OZ PLAST GRAD ST (MISCELLANEOUS) ×3 IMPLANT
GLOVE BIO SURGEON STRL SZ8 (GLOVE) ×3 IMPLANT
GLOVE BIOGEL M 6.5 STRL (GLOVE) ×3 IMPLANT
GLOVE SURG LX 7.5 STRW (GLOVE) ×2
GLOVE SURG LX STRL 7.5 STRW (GLOVE) ×1 IMPLANT
GOWN STRL REUS W/ TWL LRG LVL3 (GOWN DISPOSABLE) ×2 IMPLANT
GOWN STRL REUS W/TWL LRG LVL3 (GOWN DISPOSABLE) ×4
LENS IOL ACRSF IQ PC 21.5 (Intraocular Lens) ×1 IMPLANT
LENS IOL ACRYSOF IQ POST 21.5 (Intraocular Lens) ×3 IMPLANT
PACK CATARACT (MISCELLANEOUS) ×3 IMPLANT
PACK CATARACT BRASINGTON LX (MISCELLANEOUS) ×3 IMPLANT
PACK EYE AFTER SURG (MISCELLANEOUS) ×3 IMPLANT
SOL BSS BAG (MISCELLANEOUS) ×3
SOL PREP PVP 2OZ (MISCELLANEOUS) ×3
SOLUTION BSS BAG (MISCELLANEOUS) ×1 IMPLANT
SOLUTION PREP PVP 2OZ (MISCELLANEOUS) ×1 IMPLANT
SYR 3ML LL SCALE MARK (SYRINGE) ×6 IMPLANT
SYR 5ML LL (SYRINGE) ×3 IMPLANT
SYR TB 1ML 27GX1/2 LL (SYRINGE) ×3 IMPLANT
WATER STERILE IRR 250ML POUR (IV SOLUTION) ×3 IMPLANT
WIPE NON LINTING 3.25X3.25 (MISCELLANEOUS) ×3 IMPLANT

## 2016-02-12 NOTE — Anesthesia Preprocedure Evaluation (Signed)
Anesthesia Evaluation  Patient identified by MRN, date of birth, ID band Patient awake    Reviewed: Allergy & Precautions, H&P , NPO status , Patient's Chart, lab work & pertinent test results, reviewed documented beta blocker date and time   History of Anesthesia Complications Negative for: history of anesthetic complications  Airway Mallampati: II  TM Distance: >3 FB Neck ROM: full    Dental no notable dental hx. (+) Teeth Intact 2 permanent bridges:   Pulmonary neg pulmonary ROS, shortness of breath, asthma , sleep apnea (mild) , neg COPD, neg recent URI,    Pulmonary exam normal breath sounds clear to auscultation       Cardiovascular Exercise Tolerance: Good negative cardio ROS Normal cardiovascular exam Rhythm:regular Rate:Normal     Neuro/Psych negative neurological ROS  negative psych ROS   GI/Hepatic Neg liver ROS, GERD  ,  Endo/Other  negative endocrine ROS  Renal/GU negative Renal ROS  negative genitourinary   Musculoskeletal   Abdominal   Peds  Hematology negative hematology ROS (+)   Anesthesia Other Findings Past Medical History: No date: Anxiety No date: Arthritis No date: Asthma No date: Edema     Comment: feet No date: GERD (gastroesophageal reflux disease) No date: Headache     Comment: MIGRAINES No date: History of orthopnea No date: Hypertension No date: Hypothyroidism     Comment: NODULES No date: Shortness of breath dyspnea     Comment: WITH EXERTION No date: Sleep apnea     Comment: MILD, DOES NOT USE CPAP   Reproductive/Obstetrics negative OB ROS                             Anesthesia Physical Anesthesia Plan  ASA: II  Anesthesia Plan: MAC   Post-op Pain Management:    Induction:   Airway Management Planned:   Additional Equipment:   Intra-op Plan:   Post-operative Plan:   Informed Consent: I have reviewed the patients History and  Physical, chart, labs and discussed the procedure including the risks, benefits and alternatives for the proposed anesthesia with the patient or authorized representative who has indicated his/her understanding and acceptance.   Dental Advisory Given  Plan Discussed with: Anesthesiologist, CRNA and Surgeon  Anesthesia Plan Comments:         Anesthesia Quick Evaluation

## 2016-02-12 NOTE — Discharge Instructions (Signed)
Eye Surgery Discharge Instructions  Expect mild scratchy sensation or mild soreness. DO NOT RUB YOUR EYE!  The day of surgery:  Minimal physical activity, but bed rest is not required  No reading, computer work, or close hand work  No bending, lifting, or straining.  May watch TV  For 24 hours:  No driving, legal decisions, or alcoholic beverages  Safety precautions  Eat anything you prefer: It is better to start with liquids, then soup then solid foods.  _____ Eye patch should be worn until postoperative exam tomorrow.  ____ Solar shield eyeglasses should be worn for comfort in the sunlight/patch while sleeping  Resume all regular medications including aspirin or Coumadin if these were discontinued prior to surgery. You may shower, bathe, shave, or wash your hair. Tylenol may be taken for mild discomfort.  Call your doctor if you experience significant pain, nausea, or vomiting, fever > 101 or other signs of infection. [phone] or [phone] Specific instructions:  Follow-up Information    Anjanette Clydene Laming, NP .   Specialty:  Obstetrics and Gynecology Contact information: [place] [address] [phone]        Benay Pillow, MD .   Specialty:  Ophthalmology Why:  02-13-16 at 9:20 Contact information: [address] [place] [address] [phone]          Eye Surgery Discharge Instructions  Expect mild scratchy sensation or mild soreness. DO NOT RUB YOUR EYE!  The day of surgery:  Minimal physical activity, but bed rest is not required  No reading, computer work, or close hand work  No bending, lifting, or straining.  May watch TV  For 24 hours:  No driving, legal decisions, or alcoholic beverages  Safety precautions  Eat anything you prefer: It is better to start with liquids, then soup then solid foods.  _____ Eye patch should be worn until postoperative exam tomorrow.  ____ Solar  shield eyeglasses should be worn for comfort in the sunlight/patch while sleeping  Resume all regular medications including aspirin or Coumadin if these were discontinued prior to surgery. You may shower, bathe, shave, or wash your hair. Tylenol may be taken for mild discomfort.  Call your doctor if you experience significant pain, nausea, or vomiting, fever > 101 or other signs of infection. [phone] or [phone] Specific instructions:  Follow-up Information    Anjanette Clydene Laming, NP .   Specialty:  Obstetrics and Gynecology Contact information: [place] [address] [phone]        Benay Pillow, MD .   Specialty:  Ophthalmology Why:  02-13-16 at 9:20 Contact information: [address] [place] [address] [phone]

## 2016-02-12 NOTE — Progress Notes (Signed)
No complaints of pain on discharge 

## 2016-02-12 NOTE — H&P (Signed)
The History and Physical notes are on paper, have been signed, and are to be scanned. The patient remains stable and unchanged from the H&P.   Previous H&P reviewed, patient examined, and there are no changes.  Lindsey Thomas 02/12/2016 7:23 AM

## 2016-02-12 NOTE — Transfer of Care (Signed)
Immediate Anesthesia Transfer of Care Note  Patient: Lindsey Thomas  Procedure(s) Performed: Procedure(s) with comments: CATARACT EXTRACTION PHACO AND INTRAOCULAR LENS PLACEMENT (IOC) (Left) - [place] 01:30 AP% 15.2 CDE 13.71 Fluid pack lot # [id number]  Patient Location: PACU  Anesthesia Type:MAC  Level of Consciousness: awake, alert  and oriented  Airway & Oxygen Therapy: Patient Spontanous Breathing  Post-op Assessment: Report given to RN and Post -op Vital signs reviewed and stable  Post vital signs: stable  Last Vitals:  Vitals:   02/12/16 0800 02/12/16 0805  BP: 135/70 135/60  Pulse: 79   Resp:  12  Temp: 36.9 C 36.9 C    Last Pain:  Vitals:   02/12/16 0805  TempSrc: Tympanic  PainSc:          Complications: No apparent anesthesia complications

## 2016-02-12 NOTE — Op Note (Signed)
OPERATIVE NOTE  Lindsey Thomas [id number] 02/12/2016   PREOPERATIVE DIAGNOSIS:  Nuclear sclerotic cataract left eye.  H25.12   POSTOPERATIVE DIAGNOSIS:    Nuclear sclerotic cataract left eye.     PROCEDURE:  Phacoemusification with posterior chamber intraocular lens placement of the left eye   LENS:   Implant Name Type Inv. Item Serial No. Manufacturer Lot No. LRB No. Used  IMPLANT LENS - [id number] Intraocular Lens IMPLANT LENS [id number] ALCON   Left 1       [id number] 21.5   ULTRASOUND TIME: 1 minutes 30 seconds.  CDE 13.72   SURGEON:  Benay Pillow, MD, MPH   ANESTHESIA:  Topical with tetracaine drops and 2% Xylocaine jelly, augmented with 1% preservative-free intracameral lidocaine.   COMPLICATIONS:  None.   DESCRIPTION OF PROCEDURE:  The patient was identified in the holding room and transported to the operating room and placed in the supine position under the operating microscope.  The left eye was identified as the operative eye and it was prepped and draped in the usual sterile ophthalmic fashion.   A 1.0 millimeter clear-corneal paracentesis was made at the 5:00 position. 0.5 ml of preservative-free 1% lidocaine with epinephrine was injected into the anterior chamber.  The anterior chamber was filled with Healon 5 viscoelastic.  A 2.4 millimeter keratome was used to make a near-clear corneal incision at the 2:00 position.  A curvilinear capsulorrhexis was made with a cystotome and capsulorrhexis forceps.  Balanced salt solution was used to hydrodissect and hydrodelineate the nucleus.   Phacoemulsification was then used in stop and chop fashion to remove the lens nucleus and epinucleus.  The remaining cortex was then removed using the irrigation and aspiration handpiece. Healon was then placed into the capsular bag to distend it for lens placement.  A lens was then injected into the capsular bag.  The remaining viscoelastic was aspirated.    Miostat was injected since the  iris was floppy, to prevent iris prolapse.   Wounds were hydrated with balanced salt solution.  The anterior chamber was inflated to a physiologic pressure with balanced salt solution.  Intracameral vigamox 0.1 mL undiltued was injected into the eye.  No wound leaks were noted.  Topical Vigamox drops were applied to the eye.  The patient was taken to the recovery room in stable condition without complications of anesthesia or surgery  Benay Pillow 02/12/2016, 8:01 AM

## 2016-02-12 NOTE — Anesthesia Postprocedure Evaluation (Signed)
Anesthesia Post Note  Patient: Lindsey Thomas  Procedure(s) Performed: Procedure(s) (LRB): CATARACT EXTRACTION PHACO AND INTRAOCULAR LENS PLACEMENT (IOC) (Left)  Patient location during evaluation: PACU Anesthesia Type: MAC Level of consciousness: awake and alert and oriented Pain management: pain level controlled Vital Signs Assessment: post-procedure vital signs reviewed and stable Respiratory status: spontaneous breathing Cardiovascular status: stable Anesthetic complications: no    Last Vitals:  Vitals:   02/12/16 0800 02/12/16 0805  BP: 135/70 135/60  Pulse: 79   Resp:  12  Temp: 36.9 C 36.9 C    Last Pain:  Vitals:   02/12/16 0805  TempSrc: Tympanic  PainSc:                  Estill Batten

## 2016-03-08 DIAGNOSIS — Z8709 Personal history of other diseases of the respiratory system: Secondary | ICD-10-CM | POA: Diagnosis not present

## 2016-03-08 DIAGNOSIS — R05 Cough: Secondary | ICD-10-CM | POA: Diagnosis not present

## 2016-03-08 DIAGNOSIS — Z23 Encounter for immunization: Secondary | ICD-10-CM | POA: Diagnosis not present

## 2016-03-08 DIAGNOSIS — J310 Chronic rhinitis: Secondary | ICD-10-CM | POA: Diagnosis not present

## 2016-03-12 DIAGNOSIS — H2511 Age-related nuclear cataract, right eye: Secondary | ICD-10-CM | POA: Diagnosis not present

## 2016-03-16 ENCOUNTER — Encounter: Payer: Self-pay | Admitting: *Deleted

## 2016-03-18 ENCOUNTER — Ambulatory Visit: Payer: Commercial Managed Care - HMO | Admitting: Certified Registered Nurse Anesthetist

## 2016-03-18 ENCOUNTER — Encounter
Admission: RE | Disposition: A | Discharge status: Home / Self Care | Payer: Self-pay | Source: Ambulatory Visit | Attending: Ophthalmology

## 2016-03-18 ENCOUNTER — Ambulatory Visit
Admission: RE | Admit: 2016-03-18 | Discharge: 2016-03-18 | Disposition: A | Discharge status: Home / Self Care | Payer: Commercial Managed Care - HMO | Source: Ambulatory Visit | Attending: Ophthalmology | Admitting: Ophthalmology

## 2016-03-18 DIAGNOSIS — I10 Essential (primary) hypertension: Secondary | ICD-10-CM | POA: Diagnosis not present

## 2016-03-18 DIAGNOSIS — F419 Anxiety disorder, unspecified: Secondary | ICD-10-CM | POA: Diagnosis not present

## 2016-03-18 DIAGNOSIS — E039 Hypothyroidism, unspecified: Secondary | ICD-10-CM | POA: Diagnosis not present

## 2016-03-18 DIAGNOSIS — K219 Gastro-esophageal reflux disease without esophagitis: Secondary | ICD-10-CM | POA: Diagnosis not present

## 2016-03-18 DIAGNOSIS — E7800 Pure hypercholesterolemia, unspecified: Secondary | ICD-10-CM | POA: Insufficient documentation

## 2016-03-18 DIAGNOSIS — Z7982 Long term (current) use of aspirin: Secondary | ICD-10-CM | POA: Diagnosis not present

## 2016-03-18 DIAGNOSIS — G4730 Sleep apnea, unspecified: Secondary | ICD-10-CM | POA: Diagnosis not present

## 2016-03-18 DIAGNOSIS — J45909 Unspecified asthma, uncomplicated: Secondary | ICD-10-CM | POA: Insufficient documentation

## 2016-03-18 DIAGNOSIS — Z79899 Other long term (current) drug therapy: Secondary | ICD-10-CM | POA: Insufficient documentation

## 2016-03-18 DIAGNOSIS — M1990 Unspecified osteoarthritis, unspecified site: Secondary | ICD-10-CM | POA: Diagnosis not present

## 2016-03-18 DIAGNOSIS — G43909 Migraine, unspecified, not intractable, without status migrainosus: Secondary | ICD-10-CM | POA: Insufficient documentation

## 2016-03-18 DIAGNOSIS — H2511 Age-related nuclear cataract, right eye: Secondary | ICD-10-CM | POA: Diagnosis not present

## 2016-03-18 HISTORY — DX: Nontoxic single thyroid nodule: E04.1

## 2016-03-18 HISTORY — PX: CATARACT EXTRACTION W/PHACO: SHX586

## 2016-03-18 HISTORY — DX: Migraine, unspecified, not intractable, without status migrainosus: G43.909

## 2016-03-18 HISTORY — DX: Pure hypercholesterolemia, unspecified: E78.00

## 2016-03-18 SURGERY — PHACOEMULSIFICATION, CATARACT, WITH IOL INSERTION
Anesthesia: Monitor Anesthesia Care | Site: Eye | Laterality: Right | Wound class: Clean

## 2016-03-18 MED ORDER — ARMC OPHTHALMIC DILATING DROPS
1.0000 "application " | OPHTHALMIC | Status: AC
  Administered 2016-03-18 (×3): 1 via OPHTHALMIC

## 2016-03-18 MED ORDER — SODIUM HYALURONATE 23 MG/ML IO SOLN
INTRAOCULAR | Status: DC | PRN
  Administered 2016-03-18: 0.6 mL via INTRAOCULAR

## 2016-03-18 MED ORDER — EPINEPHRINE PF 1 MG/ML IJ SOLN
INTRAMUSCULAR | Status: AC
  Filled 2016-03-18: qty 2

## 2016-03-18 MED ORDER — MOXIFLOXACIN HCL 0.5 % OP SOLN
OPHTHALMIC | Status: AC
  Filled 2016-03-18: qty 3

## 2016-03-18 MED ORDER — MOXIFLOXACIN HCL 0.5 % OP SOLN
OPHTHALMIC | Status: DC | PRN
  Administered 2016-03-18: 5 [drp] via OPHTHALMIC

## 2016-03-18 MED ORDER — EPINEPHRINE PF 1 MG/ML IJ SOLN
INTRAOCULAR | Status: DC | PRN
  Administered 2016-03-18: 250 mL via OPHTHALMIC

## 2016-03-18 MED ORDER — SODIUM HYALURONATE 23 MG/ML IO SOLN
INTRAOCULAR | Status: AC
  Filled 2016-03-18: qty 0.6

## 2016-03-18 MED ORDER — SODIUM CHLORIDE 0.9 % IV SOLN
INTRAVENOUS | Status: DC
  Administered 2016-03-18: 11:00:00 via INTRAVENOUS

## 2016-03-18 MED ORDER — LIDOCAINE HCL (PF) 4 % IJ SOLN
INTRAMUSCULAR | Status: AC
  Filled 2016-03-18: qty 5

## 2016-03-18 MED ORDER — MIDAZOLAM HCL 2 MG/2ML IJ SOLN
INTRAMUSCULAR | Status: DC | PRN
Start: 2016-03-18 — End: 2016-03-18
  Administered 2016-03-18: 1 mg via INTRAVENOUS

## 2016-03-18 MED ORDER — SODIUM HYALURONATE 10 MG/ML IO SOLN
INTRAOCULAR | Status: AC
  Filled 2016-03-18: qty 0.85

## 2016-03-18 MED ORDER — LIDOCAINE HCL (PF) 4 % IJ SOLN
INTRAMUSCULAR | Status: DC | PRN
  Administered 2016-03-18: 4 mL via OPHTHALMIC

## 2016-03-18 MED ORDER — MOXIFLOXACIN HCL 0.5 % OP SOLN: 1.0000 [drp] | OPHTHALMIC | Status: DC | PRN

## 2016-03-18 MED ORDER — ARMC OPHTHALMIC DILATING DROPS
OPHTHALMIC | Status: AC
  Administered 2016-03-18: 1 via OPHTHALMIC
  Filled 2016-03-18: qty 0.4

## 2016-03-18 MED ORDER — SODIUM HYALURONATE 10 MG/ML IO SOLN
INTRAOCULAR | Status: DC | PRN
  Administered 2016-03-18: 0.85 mL via INTRAOCULAR

## 2016-03-18 MED ORDER — POVIDONE-IODINE 5 % OP SOLN
OPHTHALMIC | Status: AC
  Filled 2016-03-18: qty 30

## 2016-03-18 SURGICAL SUPPLY — 22 items
CANNULA ANT/CHMB 27GA (MISCELLANEOUS) ×6 IMPLANT
CUP MEDICINE 2OZ PLAST GRAD ST (MISCELLANEOUS) ×3 IMPLANT
GLOVE BIO SURGEON STRL SZ8 (GLOVE) ×3 IMPLANT
GLOVE BIOGEL M 6.5 STRL (GLOVE) ×3 IMPLANT
GLOVE SURG LX 7.5 STRW (GLOVE) ×2
GLOVE SURG LX STRL 7.5 STRW (GLOVE) ×1 IMPLANT
GOWN STRL REUS W/ TWL LRG LVL3 (GOWN DISPOSABLE) ×2 IMPLANT
GOWN STRL REUS W/TWL LRG LVL3 (GOWN DISPOSABLE) ×4
LENS IOL ACRSF IQ PC 21.0 (Intraocular Lens) ×1 IMPLANT
LENS IOL ACRYSOF IQ POST 21.0 (Intraocular Lens) ×3 IMPLANT
PACK CATARACT (MISCELLANEOUS) ×3 IMPLANT
PACK CATARACT BRASINGTON LX (MISCELLANEOUS) ×3 IMPLANT
PACK EYE AFTER SURG (MISCELLANEOUS) ×3 IMPLANT
SOL BSS BAG (MISCELLANEOUS) ×3
SOL PREP PVP 2OZ (MISCELLANEOUS) ×3
SOLUTION BSS BAG (MISCELLANEOUS) ×1 IMPLANT
SOLUTION PREP PVP 2OZ (MISCELLANEOUS) ×1 IMPLANT
SYR 3ML LL SCALE MARK (SYRINGE) ×6 IMPLANT
SYR 5ML LL (SYRINGE) ×3 IMPLANT
SYR TB 1ML 27GX1/2 LL (SYRINGE) ×3 IMPLANT
WATER STERILE IRR 250ML POUR (IV SOLUTION) ×3 IMPLANT
WIPE NON LINTING 3.25X3.25 (MISCELLANEOUS) ×3 IMPLANT

## 2016-03-18 NOTE — Anesthesia Preprocedure Evaluation (Signed)
Anesthesia Evaluation  Patient identified by MRN, date of birth, ID band Patient awake    Reviewed: Allergy & Precautions, NPO status , Patient's Chart, lab work & pertinent test results  Airway Mallampati: III  TM Distance: <3 FB     Dental  (+) Teeth Intact   Pulmonary asthma , sleep apnea ,     + decreased breath sounds      Cardiovascular hypertension, Pt. on medications + Orthopnea   Rhythm:Regular     Neuro/Psych Anxiety    GI/Hepatic Neg liver ROS, GERD  ,  Endo/Other  Hypothyroidism   Renal/GU negative Renal ROS     Musculoskeletal   Abdominal Normal abdominal exam  (+)   Peds  Hematology   Anesthesia Other Findings   Reproductive/Obstetrics                             Anesthesia Physical Anesthesia Plan  ASA: II  Anesthesia Plan: MAC   Post-op Pain Management:    Induction: Intravenous  Airway Management Planned: Natural Airway and Nasal Cannula  Additional Equipment:   Intra-op Plan:   Post-operative Plan:   Informed Consent: I have reviewed the patients History and Physical, chart, labs and discussed the procedure including the risks, benefits and alternatives for the proposed anesthesia with the patient or authorized representative who has indicated his/her understanding and acceptance.     Plan Discussed with: CRNA  Anesthesia Plan Comments:         Anesthesia Quick Evaluation

## 2016-03-18 NOTE — Discharge Instructions (Signed)
Eye Surgery Discharge Instructions  Expect mild scratchy sensation or mild soreness. DO NOT RUB YOUR EYE!  The day of surgery:  Minimal physical activity, but bed rest is not required  No reading, computer work, or close hand work  No bending, lifting, or straining.  May watch TV  For 24 hours:  No driving, legal decisions, or alcoholic beverages  Safety precautions  Eat anything you prefer: It is better to start with liquids, then soup then solid foods.  _____ Eye patch should be worn until postoperative exam tomorrow.  ____ Solar shield eyeglasses should be worn for comfort in the sunlight/patch while sleeping  Resume all regular medications including aspirin or Coumadin if these were discontinued prior to surgery. You may shower, bathe, shave, or wash your hair. Tylenol may be taken for mild discomfort.  Call your doctor if you experience significant pain, nausea, or vomiting, fever > 101 or other signs of infection. [phone] or [phone] Specific instructions:  Follow-up Information    Benay Pillow, MD. Go on 03/19/2016.   Specialty:  Ophthalmology Why:  9:45am Contact information: [address] [place] [address] [phone]

## 2016-03-18 NOTE — H&P (Signed)
The History and Physical notes are on paper, have been signed, and are to be scanned. The patient remains stable and unchanged from the H&P.   Previous H&P reviewed, patient examined, and there are no changes.  Lindsey Thomas 03/18/2016 12:06 PM

## 2016-03-18 NOTE — Anesthesia Postprocedure Evaluation (Signed)
Anesthesia Post Note  Patient: Lindsey Thomas  Procedure(s) Performed: Procedure(s) (LRB): CATARACT EXTRACTION PHACO AND INTRAOCULAR LENS PLACEMENT (IOC) (Right)  Patient location during evaluation: PACU Anesthesia Type: MAC Level of consciousness: awake and alert and oriented Pain management: satisfactory to patient Vital Signs Assessment: post-procedure vital signs reviewed and stable Respiratory status: respiratory function stable Cardiovascular status: stable    Last Vitals:  Vitals:   03/18/16 1023  BP: 132/71  Pulse: 65  Resp: 16  Temp: (!) 35.8 C    Last Pain:  Vitals:   03/18/16 1023  TempSrc: Tympanic                 Blima Singer

## 2016-03-18 NOTE — Op Note (Signed)
OPERATIVE NOTE  Lindsey Thomas [id number] 03/18/2016   PREOPERATIVE DIAGNOSIS:  Nuclear sclerotic cataract right eye.  H25.11   POSTOPERATIVE DIAGNOSIS:    Nuclear sclerotic cataract right eye.     PROCEDURE:  Phacoemusification with posterior chamber intraocular lens placement of the right eye   LENS:   Implant Name Type Inv. Item Serial No. Manufacturer Lot No. LRB No. Used  IMPLANT LENS - [id number] Intraocular Lens IMPLANT LENS [id number] ALCON   Right 1       SN60WF 21.0   ULTRASOUND TIME: 1 minutes 05 seconds.  CDE 9.33   SURGEON:  Benay Pillow, MD, MPH  ANESTHESIOLOGIST: Anesthesiologist: Iver Nestle, MD CRNA: Demetrius Charity, CRNA   ANESTHESIA:  Topical with tetracaine drops and 2% Xylocaine jelly, augmented with 1% preservative-free intracameral lidocaine.  ESTIMATED BLOOD LOSS: less than 1 mL.   COMPLICATIONS:  None.   DESCRIPTION OF PROCEDURE:  The patient was identified in the holding room and transported to the operating room and placed in the supine position under the operating microscope.  The right eye was identified as the operative eye and it was prepped and draped in the usual sterile ophthalmic fashion.   A 1.0 millimeter clear-corneal paracentesis was made at the 10:30 position. 0.5 ml of preservative-free 1% lidocaine with epinephrine was injected into the anterior chamber.  The anterior chamber was filled with Healon 5 viscoelastic.  A 2.4 millimeter keratome was used to make a near-clear corneal incision at the 8:00 position.  A curvilinear capsulorrhexis was made with a cystotome and capsulorrhexis forceps.  Balanced salt solution was used to hydrodissect and hydrodelineate the nucleus.   Phacoemulsification was then used in stop and chop fashion to remove the lens nucleus and epinucleus.  The remaining cortex was then removed using the irrigation and aspiration handpiece. Healon was then placed into the capsular bag to distend it for  lens placement.  A lens was then injected into the capsular bag.  The remaining viscoelastic was aspirated.   Wounds were hydrated with balanced salt solution.  The anterior chamber was inflated to a physiologic pressure with balanced salt solution.    Intracameral vigamox 0.1 mL undiluted was injected into the eye.  Good routine case.  No wound leaks were noted.  Topical Vigamox drops were applied to the eye.  The patient was taken to the recovery room in stable condition without complications of anesthesia or surgery  Benay Pillow 03/18/2016, 12:36 PM

## 2016-03-18 NOTE — Transfer of Care (Signed)
Immediate Anesthesia Transfer of Care Note  Patient: ADDIS BENNIE  Procedure(s) Performed: Procedure(s) with comments: CATARACT EXTRACTION PHACO AND INTRAOCULAR LENS PLACEMENT ([place]) (Right) - Lot # [id number] H [place]: 01:05.5 AP%:14.3 CDE: 9.33  Patient Location: PACU  Anesthesia Type:MAC  Level of Consciousness: awake, alert  and oriented  Airway & Oxygen Therapy: Patient Spontanous Breathing  Post-op Assessment: Report given to RN and Post -op Vital signs reviewed and stable  Post vital signs: Reviewed and stable  Last Vitals:  Vitals:   03/18/16 1023  BP: 132/71  Pulse: 65  Resp: 16  Temp: (!) 35.8 C    Last Pain:  Vitals:   03/18/16 1023  TempSrc: Tympanic         Complications: No apparent anesthesia complications

## 2016-03-18 NOTE — Anesthesia Procedure Notes (Signed)
Procedure Name: MAC Performed by: Izaan Kingbird Pre-anesthesia Checklist: Patient identified, Suction available, Emergency Drugs available, Patient being monitored and Timeout performed Oxygen Delivery Method: Nasal cannula       

## 2016-03-18 NOTE — Anesthesia Procedure Notes (Deleted)
Performed by: Demetrius Charity

## 2016-04-07 DIAGNOSIS — E7800 Pure hypercholesterolemia, unspecified: Secondary | ICD-10-CM | POA: Diagnosis not present

## 2016-04-07 DIAGNOSIS — I10 Essential (primary) hypertension: Secondary | ICD-10-CM | POA: Diagnosis not present

## 2016-04-07 DIAGNOSIS — F419 Anxiety disorder, unspecified: Secondary | ICD-10-CM | POA: Diagnosis not present

## 2016-04-13 DIAGNOSIS — Z961 Presence of intraocular lens: Secondary | ICD-10-CM | POA: Diagnosis not present

## 2016-04-21 DIAGNOSIS — R079 Chest pain, unspecified: Secondary | ICD-10-CM | POA: Diagnosis not present

## 2016-04-21 DIAGNOSIS — G44009 Cluster headache syndrome, unspecified, not intractable: Secondary | ICD-10-CM | POA: Diagnosis not present

## 2016-04-21 DIAGNOSIS — R5381 Other malaise: Secondary | ICD-10-CM | POA: Diagnosis not present

## 2016-04-21 DIAGNOSIS — R0602 Shortness of breath: Secondary | ICD-10-CM | POA: Diagnosis not present

## 2016-04-21 DIAGNOSIS — Z79899 Other long term (current) drug therapy: Secondary | ICD-10-CM | POA: Diagnosis not present

## 2016-04-21 DIAGNOSIS — R5383 Other fatigue: Secondary | ICD-10-CM | POA: Diagnosis not present

## 2016-04-22 DIAGNOSIS — E7800 Pure hypercholesterolemia, unspecified: Secondary | ICD-10-CM | POA: Diagnosis not present

## 2016-04-22 DIAGNOSIS — I10 Essential (primary) hypertension: Secondary | ICD-10-CM | POA: Diagnosis not present

## 2016-04-22 DIAGNOSIS — R0789 Other chest pain: Secondary | ICD-10-CM | POA: Diagnosis not present

## 2016-04-23 ENCOUNTER — Encounter (INDEPENDENT_AMBULATORY_CARE_PROVIDER_SITE_OTHER): Payer: Self-pay | Admitting: Vascular Surgery

## 2016-04-23 ENCOUNTER — Encounter (INDEPENDENT_AMBULATORY_CARE_PROVIDER_SITE_OTHER): Payer: Self-pay

## 2016-04-23 ENCOUNTER — Ambulatory Visit (INDEPENDENT_AMBULATORY_CARE_PROVIDER_SITE_OTHER): Payer: Commercial Managed Care - HMO | Admitting: Vascular Surgery

## 2016-04-23 VITALS — BP 130/72 | HR 77 | Resp 16 | Ht <= 58 in | Wt 168.2 lb

## 2016-04-23 DIAGNOSIS — I83813 Varicose veins of bilateral lower extremities with pain: Secondary | ICD-10-CM | POA: Diagnosis not present

## 2016-04-23 DIAGNOSIS — I890 Lymphedema, not elsewhere classified: Secondary | ICD-10-CM | POA: Diagnosis not present

## 2016-04-23 DIAGNOSIS — M7989 Other specified soft tissue disorders: Secondary | ICD-10-CM

## 2016-04-23 NOTE — Assessment & Plan Note (Signed)
Improved with use of compression stockings and the lymphedema pump. Continue this as well as elevation, exercise, and other conservative measures. Return to clinic in 1 year.

## 2016-04-23 NOTE — Assessment & Plan Note (Signed)
Status post treatment several years ago. Recent venous duplex showed no severe venous disease requiring treatment.

## 2016-04-23 NOTE — Progress Notes (Signed)
MRN : [id number]  Lindsey Thomas is a 75 y.o. ([birth year]) female who presents with chief complaint of  Chief Complaint  Patient presents with  . Follow-up  .  History of Present Illness: Patient returns today in follow up of her lymphedema and leg swelling.  It is much better today with the lymphedema pump and the daily use of compression stockings.  She has no significant improvement. Her legs are less swollen, less heavy and tired. She feels well today and has no other complaints.  Current Outpatient Prescriptions  Medication Sig Dispense Refill  . aspirin EC 81 MG tablet Take 81 mg by mouth daily.    . budesonide-formoterol (SYMBICORT) 160-4.5 MCG/ACT inhaler Inhale 2 puffs into the lungs 2 (two) times daily.    . cetirizine (ZYRTEC) 10 MG tablet Take 10 mg by mouth at bedtime.    . Cholecalciferol (VITAMIN D PO) Take 5,000 Units by mouth daily.    . Cyanocobalamin (VITAMIN B-12 PO) Take 2 tablets by mouth daily.    . fluticasone (VERAMYST) 27.5 MCG/SPRAY nasal spray Place 2 sprays into the nose 2 (two) times daily.     . montelukast (SINGULAIR) 10 MG tablet Take 10 mg by mouth at bedtime.     . Multiple Vitamins-Minerals (PRESERVISION AREDS PO) Take 1 capsule by mouth 2 (two) times daily.    . Naproxen Sodium (ALEVE PO) Take 1 tablet by mouth 2 (two) times daily.    Marland Kitchen omeprazole (PRILOSEC) 20 MG capsule Take 20 mg by mouth 2 (two) times daily before a meal.     No current facility-administered medications for this visit.     Past Medical History:  Diagnosis Date  . Anxiety   . Arthritis   . Asthma   . Edema    feet  . GERD (gastroesophageal reflux disease)   . Headache    MIGRAINES  . History of orthopnea   . Hypercholesterolemia   . Hypertension   . Hypothyroidism    NODULES  . Migraine   . Shortness of breath dyspnea    WITH EXERTION  . Sleep apnea    MILD, DOES NOT USE CPAP  . Thyroid nodule     Past Surgical History:  Procedure Laterality Date  .  ABDOMINAL HYSTERECTOMY    . CARDIAC CATHETERIZATION    . CATARACT EXTRACTION W/PHACO Left 02/12/2016   Procedure: CATARACT EXTRACTION PHACO AND INTRAOCULAR LENS PLACEMENT (IOC);  Surgeon: Eulogio Bear, MD;  Location: [facility] ORS;  Service: Ophthalmology;  Laterality: Left;  [place] 01:30 AP% 15.2 CDE 13.71 Fluid pack lot # [id number]  . CATARACT EXTRACTION W/PHACO Right 03/18/2016   Procedure: CATARACT EXTRACTION PHACO AND INTRAOCULAR LENS PLACEMENT ([place]);  Surgeon: Eulogio Bear, MD;  Location: [facility] ORS;  Service: Ophthalmology;  Laterality: Right;  Lot # [id number] H [place]: 01:05.5 AP%:14.3 CDE: 9.33  . FRACTURE SURGERY Left    arm rod and screw  . KNEE ARTHROSCOPY    . OOPHORECTOMY    . RCR    . ROTATOR CUFF REPAIR Left 2006    Social History Social History  Substance Use Topics  . Smoking status: Never Smoker  . Smokeless tobacco: Never Used  . Alcohol use No     Family History No bleeding or clotting disorders  Allergies  Allergen Reactions  . Augmentin [Amoxicillin-Pot Clavulanate]   . Celecoxib   . Eryc [Erythromycin]   . Percocet [Oxycodone-Acetaminophen]      REVIEW OF SYSTEMS (Negative unless checked)  Constitutional: '[]'$   Weight loss  '[]'$ Fever  '[]'$ Chills Cardiac: '[]'$ Chest pain   '[]'$ Chest pressure   '[]'$ Palpitations   '[]'$ Shortness of breath when laying flat   '[]'$ Shortness of breath at rest   '[]'$ Shortness of breath with exertion. Vascular:  '[]'$ Pain in legs with walking   '[]'$ Pain in legs at rest   '[]'$ Pain in legs when laying flat   '[]'$ Claudication   '[]'$ Pain in feet when walking  '[]'$ Pain in feet at rest  '[]'$ Pain in feet when laying flat   '[]'$ History of DVT   '[]'$ Phlebitis   '[x]'$ Swelling in legs   '[x]'$ Varicose veins   '[]'$ Non-healing ulcers Pulmonary:   '[]'$ Uses home oxygen   '[]'$ Productive cough   '[]'$ Hemoptysis   '[]'$ Wheeze  '[]'$ COPD   '[]'$ Asthma Neurologic:  '[]'$ Dizziness  '[]'$ Blackouts   '[]'$ Seizures   '[]'$ History of stroke   '[]'$ History of TIA  '[]'$ Aphasia   '[]'$ Temporary blindness   '[]'$ Dysphagia   '[]'$ Weakness or numbness  in arms   '[]'$ Weakness or numbness in legs Musculoskeletal:  '[x]'$ Arthritis   '[]'$ Joint swelling   '[]'$ Joint pain   '[]'$ Low back pain Hematologic:  '[]'$ Easy bruising  '[]'$ Easy bleeding   '[]'$ Hypercoagulable state   '[]'$ Anemic   Gastrointestinal:  '[]'$ Blood in stool   '[]'$ Vomiting blood  '[]'$ Gastroesophageal reflux/heartburn   '[]'$ Abdominal pain Genitourinary:  '[]'$ Chronic kidney disease   '[]'$ Difficult urination  '[]'$ Frequent urination  '[]'$ Burning with urination   '[]'$ Hematuria Skin:  '[]'$ Rashes   '[]'$ Ulcers   '[]'$ Wounds Psychological:  '[]'$ History of anxiety   '[]'$  History of major depression.  Physical Examination  BP 130/72   Pulse 77   Resp 16   Ht '4\' 10"'$  (1.473 m)   Wt 168 lb 3.2 oz (76.3 kg)   BMI 35.15 kg/m  Gen:  WD/WN, NAD . Appears younger than stated age Head: [place]/AT, No temporalis wasting. Ear/Nose/Throat: Hearing grossly intact, nares w/o erythema or drainage, trachea midline Eyes: Conjunctiva clear. Sclera non-icteric Neck: Supple.  No JVD.  Pulmonary:  Good air movement, no use of accessory muscles.  Cardiac: RRR, normal S1, S2 Vascular:  Vessel Right Left  Radial Palpable Palpable  Ulnar Palpable Palpable  Brachial Palpable Palpable  Carotid Palpable, without bruit Palpable, without bruit  Aorta Not palpable N/A  Femoral Palpable Palpable  Popliteal Palpable Palpable  PT Palpable Trace Palpable  DP Palpable Palpable   Gastrointestinal: soft, non-tender/non-distended. No guarding/reflex.  Musculoskeletal: M/S 5/5 throughout.  No deformity or atrophy. Trace right lower extremity edema and 1+ left lower extremity edema. This is significantly better than her last visit Neurologic: Sensation grossly intact in extremities.  Symmetrical.  Speech is fluent.  Psychiatric: Judgment intact, Mood & affect appropriate for pt's clinical situation. Dermatologic: No rashes or ulcers noted.  No cellulitis or open wounds. Lymph : No Cervical, Axillary, or Inguinal lymphadenopathy.      Labs No results found for this  or any previous visit (from the past 2160 hour(s)).  Radiology No results found.    Assessment/Plan  Varicose veins of leg with pain, bilateral Status post treatment several years ago. Recent venous duplex showed no severe venous disease requiring treatment.  Leg swelling Improved with use of compression stockings and the lymphedema pump. Continue this as well as elevation, exercise, and other conservative measures. Return to clinic in 1 year.  Lymphedema Improved with use of compression stockings and the lymphedema pump. Continue this as well as elevation, exercise, and other conservative measures. Return to clinic in 1 year.    Leotis Pain, MD  04/23/2016 1:04 PM    This note was  created with Dragon medical transcription system.  Any errors from dictation are purely unintentional

## 2016-04-28 DIAGNOSIS — R0789 Other chest pain: Secondary | ICD-10-CM | POA: Diagnosis not present

## 2016-05-04 DIAGNOSIS — I10 Essential (primary) hypertension: Secondary | ICD-10-CM | POA: Diagnosis not present

## 2016-05-04 DIAGNOSIS — J4521 Mild intermittent asthma with (acute) exacerbation: Secondary | ICD-10-CM | POA: Diagnosis not present

## 2016-05-04 DIAGNOSIS — E7800 Pure hypercholesterolemia, unspecified: Secondary | ICD-10-CM | POA: Diagnosis not present

## 2016-05-04 DIAGNOSIS — G4734 Idiopathic sleep related nonobstructive alveolar hypoventilation: Secondary | ICD-10-CM | POA: Diagnosis not present

## 2016-05-08 ENCOUNTER — Emergency Department
Admission: EM | Admit: 2016-05-08 | Discharge: 2016-05-08 | Disposition: A | Discharge status: Home / Self Care | Payer: Medicare HMO | Attending: Emergency Medicine | Admitting: Emergency Medicine

## 2016-05-08 ENCOUNTER — Emergency Department: Payer: Medicare HMO

## 2016-05-08 DIAGNOSIS — Z79899 Other long term (current) drug therapy: Secondary | ICD-10-CM | POA: Diagnosis not present

## 2016-05-08 DIAGNOSIS — J45909 Unspecified asthma, uncomplicated: Secondary | ICD-10-CM | POA: Diagnosis not present

## 2016-05-08 DIAGNOSIS — R61 Generalized hyperhidrosis: Secondary | ICD-10-CM | POA: Diagnosis not present

## 2016-05-08 DIAGNOSIS — R079 Chest pain, unspecified: Secondary | ICD-10-CM | POA: Diagnosis not present

## 2016-05-08 DIAGNOSIS — E039 Hypothyroidism, unspecified: Secondary | ICD-10-CM | POA: Insufficient documentation

## 2016-05-08 DIAGNOSIS — I10 Essential (primary) hypertension: Secondary | ICD-10-CM | POA: Diagnosis not present

## 2016-05-08 LAB — BASIC METABOLIC PANEL
ANION GAP: 10 (ref 5–15)
BUN: 20 mg/dL (ref 6–20)
CHLORIDE: 101 mmol/L (ref 101–111)
CO2: 23 mmol/L (ref 22–32)
Calcium: 9 mg/dL (ref 8.9–10.3)
Creatinine, Ser: 0.89 mg/dL (ref 0.44–1.00)
GFR calc Af Amer: >60 mL/min (ref >60)
GFR calc non Af Amer: >60 mL/min (ref >60)
GLUCOSE: 144 mg/dL — AB (ref 65–99)
POTASSIUM: 4 mmol/L (ref 3.5–5.1)
Sodium: 134 mmol/L — ABNORMAL LOW (ref 135–145)

## 2016-05-08 LAB — CBC
HEMATOCRIT: 43.6 % (ref 35.0–47.0)
HEMOGLOBIN: 14.8 g/dL (ref 12.0–16.0)
MCH: 29.8 pg (ref 26.0–34.0)
MCHC: 34 g/dL (ref 32.0–36.0)
MCV: 87.7 fL (ref 80.0–100.0)
Platelets: 233 10*3/uL (ref 150–440)
RBC: 4.98 MIL/uL (ref 3.80–5.20)
RDW: 14.4 % (ref 11.5–14.5)
WBC: 14.6 10*3/uL — ABNORMAL HIGH (ref 3.6–11.0)

## 2016-05-08 LAB — TROPONIN I: Troponin I: <0.03 ng/mL (ref <0.03)

## 2016-05-08 NOTE — ED Notes (Signed)
Pt reports episode of left chest pain at 1830 described as an ache that has since resolved. Pt reports she had radiation to back, SOB, diaphoresis, nausea, weakness, lightheadedness/dizziness. Pt denies vomiting.  Pt currently c/o headache and general malaise, weakness.   Pt reports hx of lymphedema

## 2016-05-08 NOTE — ED Triage Notes (Signed)
Pt says she has not been feeling well all day; went to the store and was at home cooking when she became hot and diaphoretic; felt dizzy and began having pain to the center of her chest; felt tired; pt currently being followed by Dr Blanchard Mane had similar episode a few weeks ago but not as bad; EKG and nuclear stress test were negative; this week she has scheduled a pulmonary function test; pt awake and alert; talking in complete coherent sentences

## 2016-05-08 NOTE — ED Notes (Signed)
MD Joni Fears at bedside.

## 2016-05-08 NOTE — ED Provider Notes (Signed)
[facility] Emergency Department Provider Note  ____________________________________________  Time seen: Approximately 11:17 PM  I have reviewed the triage vital signs and the nursing notes.   HISTORY  Chief Complaint Chest Pain    HPI Lindsey Thomas is a 75 y.o. female who reports that today she was in the kitchen doing some cooking when she had a central chest aching that lasted for a few minutes. Radiated to her back associated with shortness of breath and diaphoresis nausea and lightheadedness. No vomiting or syncope. It quickly resolved and she returned back to baseline. She's had a history of this occurring over the past month. She's had extensive previous workup including a nuclear medicine stress test which was unremarkable. She followed up with Dr. Ubaldo Glassing who is considering a cardiac catheterization versus obtaining PFTs this week. Currently she is asymptomatic. Pain occurred about 6:30 PM today and has not reoccurred since then. No other acute complaints.  No aggravating or alleviating factors. Pain was nonexertional. Not pleuritic. Past Medical History:  Diagnosis Date  . Anxiety   . Arthritis   . Asthma   . Edema    feet  . GERD (gastroesophageal reflux disease)   . Headache    MIGRAINES  . History of orthopnea   . Hypercholesterolemia   . Hypertension   . Hypothyroidism    NODULES  . Migraine   . Shortness of breath dyspnea    WITH EXERTION  . Sleep apnea    MILD, DOES NOT USE CPAP  . Thyroid nodule      Patient Active Problem List   Diagnosis Date Noted  . 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 Leg swelling 04/23/2016  . Lymphedema 04/23/2016     Past Surgical History:  Procedure Laterality Date  . ABDOMINAL HYSTERECTOMY    . CARDIAC CATHETERIZATION    . CATARACT EXTRACTION W/PHACO Left 02/12/2016   Procedure: CATARACT EXTRACTION PHACO AND INTRAOCULAR LENS PLACEMENT (IOC);  Surgeon: Eulogio Bear, MD;   Location: [facility] ORS;  Service: Ophthalmology;  Laterality: Left;  [place] 01:30 AP% 15.2 CDE 13.71 Fluid pack lot # [id number]  . CATARACT EXTRACTION W/PHACO Right 03/18/2016   Procedure: CATARACT EXTRACTION PHACO AND INTRAOCULAR LENS PLACEMENT ([place]);  Surgeon: Eulogio Bear, MD;  Location: [facility] ORS;  Service: Ophthalmology;  Laterality: Right;  Lot # [id number] H [place]: 01:05.5 AP%:14.3 CDE: 9.33  . FRACTURE SURGERY Left    arm rod and screw  . KNEE ARTHROSCOPY    . OOPHORECTOMY    . RCR    . ROTATOR CUFF REPAIR Left 2006     Prior to Admission medications   Medication Sig Start Date End Date Taking? Authorizing Provider  aspirin EC 81 MG tablet Take 81 mg by mouth daily.   Yes Historical Provider, MD  budesonide-formoterol (SYMBICORT) 160-4.5 MCG/ACT inhaler Inhale 2 puffs into the lungs 2 (two) times daily.   Yes Historical Provider, MD  Cholecalciferol (VITAMIN D PO) Take 5,000 Units by mouth daily.   Yes Historical Provider, MD  Cyanocobalamin (VITAMIN B-12 PO) Take 2 tablets by mouth daily.   Yes Historical Provider, MD  fluticasone (FLONASE) 50 MCG/ACT nasal spray Place 1 spray into both nostrils daily.   Yes Historical Provider, MD  loratadine (CLARITIN) 10 MG tablet Take 10 mg by mouth daily.   Yes Historical Provider, MD  montelukast (SINGULAIR) 10 MG tablet Take 10 mg by mouth at bedtime.    Yes Historical Provider, MD  Multiple Vitamins-Minerals (PRESERVISION  AREDS PO) Take 1 capsule by mouth 2 (two) times daily.   Yes Historical Provider, MD  Naproxen Sodium (ALEVE PO) Take 1 tablet by mouth 2 (two) times daily.   Yes Historical Provider, MD  omeprazole (PRILOSEC) 20 MG capsule Take 20 mg by mouth 2 (two) times daily before a meal.   Yes Historical Provider, MD     Allergies Augmentin [amoxicillin-pot clavulanate]; Celecoxib; Eryc [erythromycin]; and Percocet [oxycodone-acetaminophen]   No family history on file.  Social History Social History  Substance Use Topics  .  Smoking status: Never Smoker  . Smokeless tobacco: Never Used  . Alcohol use No    Review of Systems  Constitutional:   No fever or chills.  ENT:   No sore throat. No rhinorrhea. Cardiovascular:   Positive as above chest pain. Respiratory:   No dyspnea or cough. Gastrointestinal:   Negative for abdominal pain, vomiting and diarrhea.  Genitourinary:   Negative for dysuria or difficulty urinating. Musculoskeletal:   Negative for focal pain or swelling Neurological:   Negative for headaches 10-point ROS otherwise negative.  ____________________________________________   PHYSICAL EXAM:  VITAL SIGNS: ED Triage Vitals  Enc Vitals Group     BP 05/08/16 1907 (!) 103/59     Pulse Rate 05/08/16 1907 89     Resp 05/08/16 1907 18     Temp 05/08/16 1907 97.8 F (36.6 C)     Temp Source 05/08/16 1907 Oral     SpO2 05/08/16 1907 97 %     Weight 05/08/16 1909 168 lb (76.2 kg)     Height 05/08/16 1909 '4\' 10"'$  (1.473 m)     Head Circumference --      Peak Flow --      Pain Score 05/08/16 1907 2     Pain Loc --      Pain Edu? --      Excl. in [place]? --     Vital signs reviewed, nursing assessments reviewed.   Constitutional:   Alert and oriented. Well appearing and in no distress. Eyes:   No scleral icterus. No conjunctival pallor. PERRL. EOMI.  No nystagmus. ENT   Head:   Normocephalic and atraumatic.   Nose:   No congestion/rhinnorhea. No septal hematoma   Mouth/Throat:   MMM, no pharyngeal erythema. No peritonsillar mass.    Neck:   No stridor. No SubQ emphysema. No meningismus. Hematological/Lymphatic/Immunilogical:   No cervical lymphadenopathy. Cardiovascular:   RRR. Symmetric bilateral radial and DP pulses.  No murmurs.  Respiratory:   Normal respiratory effort without tachypnea nor retractions. Breath sounds are clear and equal bilaterally. No wheezes/rales/rhonchi. Gastrointestinal:   Soft and nontender. Non distended. There is no CVA tenderness.  No rebound,  rigidity, or guarding. Genitourinary:   deferred Musculoskeletal:   Nontender with normal range of motion in all extremities. No joint effusions.  No lower extremity tenderness.  No edema. Neurologic:   Normal speech and language.  CN 2-10 normal. Motor grossly intact. No gross focal neurologic deficits are appreciated.  Skin:    Skin is warm, dry and intact. No rash noted.  No petechiae, purpura, or bullae.  ____________________________________________    LABS (pertinent positives/negatives) (all labs ordered are listed, but only abnormal results are displayed) Labs Reviewed  BASIC METABOLIC PANEL - Abnormal; Notable for the following:       Result Value   Sodium 134 (*)    Glucose, Bld 144 (*)    All other components within normal limits  CBC -  Abnormal; Notable for the following:    WBC 14.6 (*)    All other components within normal limits  TROPONIN I  TROPONIN I   ____________________________________________   EKG  Interpreted by me Normal sinus rhythm rate of 96, normal axis and intervals. Poor R-wave progression in anterior precordial leads. Voltage criteria for LVH in the high lateral leads. Normal ST segments and T waves.  ____________________________________________    RADIOLOGY  Chest x-ray unremarkable  ____________________________________________   PROCEDURES Procedures  ____________________________________________   INITIAL IMPRESSION / ASSESSMENT AND PLAN / ED COURSE  Pertinent labs & imaging results that were available during my care of the patient were reviewed by me and considered in my medical decision making (see chart for details).  Patient presents with nonspecific chest pain, brief episode that started while she was cooking, not associated with any significant exertion. She is recently had a nuclear stress test and evaluation by Dr. Ubaldo Glassing. Plans for PFTs which are to occur in 4 days. Afterward, seems that she'll be considered for repeat  cardiac catheterization to further evaluate these symptoms. At this time due to the chronicity and the nonspecific nature of her symptoms does not seem to be warranted to hospitalize the patient for further observation. Workup is negative with 2 troponins here, chest pain has not reoccurred in the ED and patient is asymptomatic. We'll discharge home to continue her outpatient workup.     Clinical Course    ____________________________________________   FINAL CLINICAL IMPRESSION(S) / ED DIAGNOSES  Final diagnoses:  Nonspecific chest pain       Portions of this note were generated with dragon dictation software. Dictation errors may occur despite best attempts at proofreading.    Carrie Mew, MD 05/08/16 [phone]

## 2016-05-09 DIAGNOSIS — I10 Essential (primary) hypertension: Secondary | ICD-10-CM | POA: Diagnosis not present

## 2016-05-09 DIAGNOSIS — R079 Chest pain, unspecified: Secondary | ICD-10-CM | POA: Diagnosis not present

## 2016-05-09 DIAGNOSIS — J449 Chronic obstructive pulmonary disease, unspecified: Secondary | ICD-10-CM | POA: Diagnosis not present

## 2016-05-09 DIAGNOSIS — R002 Palpitations: Secondary | ICD-10-CM | POA: Diagnosis not present

## 2016-05-09 NOTE — ED Notes (Signed)
Reviewed d/c instructions, follow-up care with pt. Pt verbalized understanding 

## 2016-05-11 ENCOUNTER — Other Ambulatory Visit: Payer: Self-pay | Admitting: Physician Assistant

## 2016-05-11 ENCOUNTER — Ambulatory Visit
Admission: RE | Admit: 2016-05-11 | Discharge: 2016-05-11 | Disposition: A | Discharge status: Home / Self Care | Payer: Medicare HMO | Source: Ambulatory Visit | Attending: Physician Assistant | Admitting: Physician Assistant

## 2016-05-11 DIAGNOSIS — D72829 Elevated white blood cell count, unspecified: Secondary | ICD-10-CM

## 2016-05-11 DIAGNOSIS — R519 Headache, unspecified: Secondary | ICD-10-CM

## 2016-05-11 DIAGNOSIS — R51 Headache: Principal | ICD-10-CM

## 2016-05-11 DIAGNOSIS — R200 Anesthesia of skin: Secondary | ICD-10-CM | POA: Diagnosis not present

## 2016-05-11 DIAGNOSIS — R079 Chest pain, unspecified: Secondary | ICD-10-CM | POA: Diagnosis not present

## 2016-05-12 DIAGNOSIS — R0602 Shortness of breath: Secondary | ICD-10-CM | POA: Diagnosis not present

## 2016-06-03 DIAGNOSIS — R400 Somnolence: Secondary | ICD-10-CM | POA: Diagnosis not present

## 2016-06-03 DIAGNOSIS — I10 Essential (primary) hypertension: Secondary | ICD-10-CM | POA: Diagnosis not present

## 2016-06-03 DIAGNOSIS — E7800 Pure hypercholesterolemia, unspecified: Secondary | ICD-10-CM | POA: Diagnosis not present

## 2016-06-03 DIAGNOSIS — F419 Anxiety disorder, unspecified: Secondary | ICD-10-CM | POA: Diagnosis not present

## 2016-06-22 DIAGNOSIS — I10 Essential (primary) hypertension: Secondary | ICD-10-CM | POA: Diagnosis not present

## 2016-06-22 DIAGNOSIS — E6609 Other obesity due to excess calories: Secondary | ICD-10-CM | POA: Diagnosis not present

## 2016-06-22 DIAGNOSIS — Z6834 Body mass index (BMI) 34.0-34.9, adult: Secondary | ICD-10-CM | POA: Diagnosis not present

## 2016-06-22 DIAGNOSIS — Z79899 Other long term (current) drug therapy: Secondary | ICD-10-CM | POA: Diagnosis not present

## 2016-06-22 DIAGNOSIS — E041 Nontoxic single thyroid nodule: Secondary | ICD-10-CM | POA: Diagnosis not present

## 2016-06-22 DIAGNOSIS — E7800 Pure hypercholesterolemia, unspecified: Secondary | ICD-10-CM | POA: Diagnosis not present

## 2016-07-07 DIAGNOSIS — J45909 Unspecified asthma, uncomplicated: Secondary | ICD-10-CM | POA: Diagnosis not present

## 2016-08-12 ENCOUNTER — Ambulatory Visit: Payer: Medicare HMO | Attending: Cardiology

## 2016-08-12 DIAGNOSIS — G4733 Obstructive sleep apnea (adult) (pediatric): Secondary | ICD-10-CM | POA: Insufficient documentation

## 2016-08-12 DIAGNOSIS — G4730 Sleep apnea, unspecified: Secondary | ICD-10-CM | POA: Diagnosis not present

## 2016-08-12 DIAGNOSIS — R400 Somnolence: Secondary | ICD-10-CM | POA: Insufficient documentation

## 2016-09-06 DIAGNOSIS — G4733 Obstructive sleep apnea (adult) (pediatric): Secondary | ICD-10-CM | POA: Diagnosis not present

## 2016-09-06 DIAGNOSIS — J4520 Mild intermittent asthma, uncomplicated: Secondary | ICD-10-CM | POA: Diagnosis not present

## 2016-09-15 DIAGNOSIS — E041 Nontoxic single thyroid nodule: Secondary | ICD-10-CM | POA: Diagnosis not present

## 2016-09-15 DIAGNOSIS — E7800 Pure hypercholesterolemia, unspecified: Secondary | ICD-10-CM | POA: Diagnosis not present

## 2016-09-15 DIAGNOSIS — I10 Essential (primary) hypertension: Secondary | ICD-10-CM | POA: Diagnosis not present

## 2016-09-15 DIAGNOSIS — Z79899 Other long term (current) drug therapy: Secondary | ICD-10-CM | POA: Diagnosis not present

## 2016-09-16 ENCOUNTER — Ambulatory Visit: Payer: Medicare HMO | Attending: Neurology

## 2016-09-16 DIAGNOSIS — G4733 Obstructive sleep apnea (adult) (pediatric): Secondary | ICD-10-CM | POA: Insufficient documentation

## 2016-09-22 DIAGNOSIS — E7800 Pure hypercholesterolemia, unspecified: Secondary | ICD-10-CM | POA: Diagnosis not present

## 2016-09-22 DIAGNOSIS — R42 Dizziness and giddiness: Secondary | ICD-10-CM | POA: Diagnosis not present

## 2016-09-22 DIAGNOSIS — I878 Other specified disorders of veins: Secondary | ICD-10-CM | POA: Diagnosis not present

## 2016-09-22 DIAGNOSIS — E041 Nontoxic single thyroid nodule: Secondary | ICD-10-CM | POA: Diagnosis not present

## 2016-09-22 DIAGNOSIS — I10 Essential (primary) hypertension: Secondary | ICD-10-CM | POA: Diagnosis not present

## 2016-09-22 DIAGNOSIS — Z0000 Encounter for general adult medical examination without abnormal findings: Secondary | ICD-10-CM | POA: Diagnosis not present

## 2016-09-23 DIAGNOSIS — J301 Allergic rhinitis due to pollen: Secondary | ICD-10-CM | POA: Diagnosis not present

## 2016-09-23 DIAGNOSIS — R42 Dizziness and giddiness: Secondary | ICD-10-CM | POA: Diagnosis not present

## 2016-10-06 DIAGNOSIS — E7800 Pure hypercholesterolemia, unspecified: Secondary | ICD-10-CM | POA: Diagnosis not present

## 2016-10-06 DIAGNOSIS — G4733 Obstructive sleep apnea (adult) (pediatric): Secondary | ICD-10-CM | POA: Diagnosis not present

## 2016-10-06 DIAGNOSIS — I10 Essential (primary) hypertension: Secondary | ICD-10-CM | POA: Diagnosis not present

## 2016-11-04 DIAGNOSIS — G4733 Obstructive sleep apnea (adult) (pediatric): Secondary | ICD-10-CM | POA: Diagnosis not present

## 2016-12-04 DIAGNOSIS — G4733 Obstructive sleep apnea (adult) (pediatric): Secondary | ICD-10-CM | POA: Diagnosis not present

## 2016-12-06 DIAGNOSIS — G4733 Obstructive sleep apnea (adult) (pediatric): Secondary | ICD-10-CM | POA: Diagnosis not present

## 2016-12-10 DIAGNOSIS — J069 Acute upper respiratory infection, unspecified: Secondary | ICD-10-CM | POA: Diagnosis not present

## 2016-12-22 ENCOUNTER — Other Ambulatory Visit: Payer: Self-pay | Admitting: Internal Medicine

## 2016-12-22 DIAGNOSIS — G4733 Obstructive sleep apnea (adult) (pediatric): Secondary | ICD-10-CM | POA: Diagnosis not present

## 2016-12-22 DIAGNOSIS — E7800 Pure hypercholesterolemia, unspecified: Secondary | ICD-10-CM | POA: Diagnosis not present

## 2016-12-22 DIAGNOSIS — R05 Cough: Secondary | ICD-10-CM | POA: Diagnosis not present

## 2016-12-22 DIAGNOSIS — Z1231 Encounter for screening mammogram for malignant neoplasm of breast: Secondary | ICD-10-CM | POA: Diagnosis not present

## 2016-12-22 DIAGNOSIS — Z79899 Other long term (current) drug therapy: Secondary | ICD-10-CM | POA: Diagnosis not present

## 2016-12-22 DIAGNOSIS — F419 Anxiety disorder, unspecified: Secondary | ICD-10-CM | POA: Diagnosis not present

## 2016-12-22 DIAGNOSIS — Z1239 Encounter for other screening for malignant neoplasm of breast: Secondary | ICD-10-CM

## 2016-12-22 DIAGNOSIS — E041 Nontoxic single thyroid nodule: Secondary | ICD-10-CM | POA: Diagnosis not present

## 2016-12-22 DIAGNOSIS — I10 Essential (primary) hypertension: Secondary | ICD-10-CM | POA: Diagnosis not present

## 2016-12-23 DIAGNOSIS — J439 Emphysema, unspecified: Secondary | ICD-10-CM | POA: Diagnosis not present

## 2016-12-23 DIAGNOSIS — J209 Acute bronchitis, unspecified: Secondary | ICD-10-CM | POA: Diagnosis not present

## 2016-12-23 DIAGNOSIS — G4733 Obstructive sleep apnea (adult) (pediatric): Secondary | ICD-10-CM | POA: Diagnosis not present

## 2017-01-03 DIAGNOSIS — Z86018 Personal history of other benign neoplasm: Secondary | ICD-10-CM | POA: Diagnosis not present

## 2017-01-03 DIAGNOSIS — L570 Actinic keratosis: Secondary | ICD-10-CM | POA: Diagnosis not present

## 2017-01-03 DIAGNOSIS — L578 Other skin changes due to chronic exposure to nonionizing radiation: Secondary | ICD-10-CM | POA: Diagnosis not present

## 2017-01-03 DIAGNOSIS — B372 Candidiasis of skin and nail: Secondary | ICD-10-CM | POA: Diagnosis not present

## 2017-01-04 DIAGNOSIS — G4733 Obstructive sleep apnea (adult) (pediatric): Secondary | ICD-10-CM | POA: Diagnosis not present

## 2017-01-10 ENCOUNTER — Ambulatory Visit
Admission: RE | Admit: 2017-01-10 | Discharge: 2017-01-10 | Disposition: A | Discharge status: Home / Self Care | Payer: Medicare HMO | Source: Ambulatory Visit | Attending: Internal Medicine | Admitting: Internal Medicine

## 2017-01-10 DIAGNOSIS — Z1231 Encounter for screening mammogram for malignant neoplasm of breast: Secondary | ICD-10-CM | POA: Insufficient documentation

## 2017-01-10 DIAGNOSIS — Z1239 Encounter for other screening for malignant neoplasm of breast: Secondary | ICD-10-CM

## 2017-01-13 DIAGNOSIS — E7800 Pure hypercholesterolemia, unspecified: Secondary | ICD-10-CM | POA: Diagnosis not present

## 2017-01-13 DIAGNOSIS — I10 Essential (primary) hypertension: Secondary | ICD-10-CM | POA: Diagnosis not present

## 2017-01-13 DIAGNOSIS — G4733 Obstructive sleep apnea (adult) (pediatric): Secondary | ICD-10-CM | POA: Diagnosis not present

## 2017-02-04 DIAGNOSIS — G4733 Obstructive sleep apnea (adult) (pediatric): Secondary | ICD-10-CM | POA: Diagnosis not present

## 2017-03-06 DIAGNOSIS — G4733 Obstructive sleep apnea (adult) (pediatric): Secondary | ICD-10-CM | POA: Diagnosis not present

## 2017-03-16 DIAGNOSIS — E041 Nontoxic single thyroid nodule: Secondary | ICD-10-CM | POA: Diagnosis not present

## 2017-03-16 DIAGNOSIS — E7800 Pure hypercholesterolemia, unspecified: Secondary | ICD-10-CM | POA: Diagnosis not present

## 2017-03-16 DIAGNOSIS — Z79899 Other long term (current) drug therapy: Secondary | ICD-10-CM | POA: Diagnosis not present

## 2017-03-16 DIAGNOSIS — I10 Essential (primary) hypertension: Secondary | ICD-10-CM | POA: Diagnosis not present

## 2017-03-23 ENCOUNTER — Other Ambulatory Visit: Payer: Self-pay | Admitting: Internal Medicine

## 2017-03-23 DIAGNOSIS — M5030 Other cervical disc degeneration, unspecified cervical region: Secondary | ICD-10-CM | POA: Diagnosis not present

## 2017-03-23 DIAGNOSIS — Z0000 Encounter for general adult medical examination without abnormal findings: Secondary | ICD-10-CM | POA: Diagnosis not present

## 2017-03-23 DIAGNOSIS — I10 Essential (primary) hypertension: Secondary | ICD-10-CM | POA: Diagnosis not present

## 2017-03-23 DIAGNOSIS — I878 Other specified disorders of veins: Secondary | ICD-10-CM | POA: Diagnosis not present

## 2017-03-23 DIAGNOSIS — M1611 Unilateral primary osteoarthritis, right hip: Secondary | ICD-10-CM | POA: Diagnosis not present

## 2017-03-23 DIAGNOSIS — Z23 Encounter for immunization: Secondary | ICD-10-CM | POA: Diagnosis not present

## 2017-03-23 DIAGNOSIS — M25551 Pain in right hip: Secondary | ICD-10-CM | POA: Diagnosis not present

## 2017-03-23 DIAGNOSIS — R0609 Other forms of dyspnea: Secondary | ICD-10-CM | POA: Diagnosis not present

## 2017-03-23 DIAGNOSIS — E7800 Pure hypercholesterolemia, unspecified: Secondary | ICD-10-CM | POA: Diagnosis not present

## 2017-03-23 DIAGNOSIS — G4733 Obstructive sleep apnea (adult) (pediatric): Secondary | ICD-10-CM | POA: Diagnosis not present

## 2017-03-23 DIAGNOSIS — E041 Nontoxic single thyroid nodule: Secondary | ICD-10-CM | POA: Diagnosis not present

## 2017-03-29 DIAGNOSIS — E041 Nontoxic single thyroid nodule: Secondary | ICD-10-CM | POA: Diagnosis not present

## 2017-03-29 DIAGNOSIS — R0609 Other forms of dyspnea: Secondary | ICD-10-CM | POA: Diagnosis not present

## 2017-03-30 ENCOUNTER — Ambulatory Visit
Admission: RE | Admit: 2017-03-30 | Discharge: 2017-03-30 | Disposition: A | Discharge status: Home / Self Care | Payer: Medicare HMO | Source: Ambulatory Visit | Attending: Internal Medicine | Admitting: Internal Medicine

## 2017-03-30 DIAGNOSIS — M50322 Other cervical disc degeneration at C5-C6 level: Secondary | ICD-10-CM | POA: Diagnosis not present

## 2017-03-30 DIAGNOSIS — M4802 Spinal stenosis, cervical region: Secondary | ICD-10-CM | POA: Diagnosis not present

## 2017-03-30 DIAGNOSIS — M5030 Other cervical disc degeneration, unspecified cervical region: Secondary | ICD-10-CM

## 2017-03-30 DIAGNOSIS — M542 Cervicalgia: Secondary | ICD-10-CM | POA: Diagnosis not present

## 2017-04-06 DIAGNOSIS — G4733 Obstructive sleep apnea (adult) (pediatric): Secondary | ICD-10-CM | POA: Diagnosis not present

## 2017-04-12 DIAGNOSIS — M47812 Spondylosis without myelopathy or radiculopathy, cervical region: Secondary | ICD-10-CM | POA: Diagnosis not present

## 2017-04-15 DIAGNOSIS — M62838 Other muscle spasm: Secondary | ICD-10-CM | POA: Diagnosis not present

## 2017-04-15 DIAGNOSIS — M5030 Other cervical disc degeneration, unspecified cervical region: Secondary | ICD-10-CM | POA: Diagnosis not present

## 2017-04-15 DIAGNOSIS — M5412 Radiculopathy, cervical region: Secondary | ICD-10-CM | POA: Diagnosis not present

## 2017-04-26 ENCOUNTER — Encounter (INDEPENDENT_AMBULATORY_CARE_PROVIDER_SITE_OTHER): Payer: Self-pay | Admitting: Vascular Surgery

## 2017-04-26 ENCOUNTER — Ambulatory Visit (INDEPENDENT_AMBULATORY_CARE_PROVIDER_SITE_OTHER): Payer: Commercial Managed Care - HMO | Admitting: Vascular Surgery

## 2017-04-26 VITALS — BP 121/67 | HR 62 | Resp 16 | Ht <= 58 in | Wt 165.0 lb

## 2017-04-26 DIAGNOSIS — I10 Essential (primary) hypertension: Secondary | ICD-10-CM

## 2017-04-26 DIAGNOSIS — I890 Lymphedema, not elsewhere classified: Secondary | ICD-10-CM | POA: Diagnosis not present

## 2017-04-26 DIAGNOSIS — M7989 Other specified soft tissue disorders: Secondary | ICD-10-CM | POA: Diagnosis not present

## 2017-04-26 NOTE — Progress Notes (Signed)
MRN : [id number]  Lindsey Thomas is a 76 y.o. (10-14-1940) female who presents with chief complaint of  Chief Complaint  Patient presents with  . Follow-up    5yr follow up  .  History of Present Illness: Patient returns today in follow up of leg swelling and lymphedema.  She has been using compression stockings daily and uses a lymphedema pump pretty much every day on the left leg.  She has not really needed to use the lymphedema pump on the right leg as her swelling is minimal on that side at this point.  Her left leg swelling is mild and has remained well controlled now for several years.  No new ulceration or infection.  Current Outpatient Medications  Medication Sig Dispense Refill  . aspirin EC 81 MG tablet Take 81 mg by mouth daily.    . Azelastine HCl 137 MCG/SPRAY SOLN     . budesonide-formoterol (SYMBICORT) 160-4.5 MCG/ACT inhaler Inhale 2 puffs into the lungs 2 (two) times daily.    . Cholecalciferol (VITAMIN D PO) Take 5,000 Units by mouth daily.    . Cyanocobalamin (VITAMIN B-12 PO) Take 2 tablets by mouth daily.    . fluticasone (FLONASE) 50 MCG/ACT nasal spray Place 1 spray into both nostrils daily.    . metoprolol succinate (TOPROL-XL) 25 MG 24 hr tablet     . montelukast (SINGULAIR) 10 MG tablet Take 10 mg by mouth at bedtime.     . Multiple Vitamins-Minerals (PRESERVISION AREDS PO) Take 1 capsule by mouth 2 (two) times daily.    . Naproxen Sodium (ALEVE PO) Take 1 tablet by mouth as needed.     Marland Kitchen omeprazole (PRILOSEC) 20 MG capsule Take 20 mg by mouth 2 (two) times daily before a meal.    . loratadine (CLARITIN) 10 MG tablet Take 10 mg by mouth daily.     No current facility-administered medications for this visit.     Past Medical History:  Diagnosis Date  . Anxiety   . Arthritis   . Asthma   . Edema    feet  . GERD (gastroesophageal reflux disease)   . Headache    MIGRAINES  . History of orthopnea   . Hypercholesterolemia   . Hypertension   .  Hypothyroidism    NODULES  . Migraine   . Shortness of breath dyspnea    WITH EXERTION  . Sleep apnea    MILD, DOES NOT USE CPAP  . Thyroid nodule     Past Surgical History:  Procedure Laterality Date  . ABDOMINAL HYSTERECTOMY    . CARDIAC CATHETERIZATION    . CATARACT EXTRACTION W/PHACO Left 02/12/2016   Procedure: CATARACT EXTRACTION PHACO AND INTRAOCULAR LENS PLACEMENT (IOC);  Surgeon: Eulogio Bear, MD;  Location: [facility];  Service: Ophthalmology;  Laterality: Left;  [place] 01:30 AP% 15.2 CDE 13.71 Fluid pack lot # [id number]  . CATARACT EXTRACTION W/PHACO Right 03/18/2016   Procedure: CATARACT EXTRACTION PHACO AND INTRAOCULAR LENS PLACEMENT ([place]);  Surgeon: Eulogio Bear, MD;  Location: [facility];  Service: Ophthalmology;  Laterality: Right;  Lot # [id number] H [place]: 01:05.5 AP%:14.3 CDE: 9.33  . FRACTURE SURGERY Left    arm rod and screw  . KNEE ARTHROSCOPY    . OOPHORECTOMY    . RCR    . ROTATOR CUFF REPAIR Left 2006    Social History Social History   Tobacco Use  . Smoking status: Never Smoker  . Smokeless tobacco: Never Used  Substance Use  Topics  . Alcohol use: No  . Drug use: Not on file     Family History Family History  Problem Relation Age of Onset  . Breast cancer Neg Hx      Allergies  Allergen Reactions  . Augmentin [Amoxicillin-Pot Clavulanate]   . Celecoxib   . Eryc [Erythromycin]   . Percocet [Oxycodone-Acetaminophen]      REVIEW OF SYSTEMS (Negative unless checked)  Constitutional: [] Weight loss  [] Fever  [] Chills Cardiac: [] Chest pain   [] Chest pressure   [] Palpitations   [] Shortness of breath when laying flat   [] Shortness of breath at rest   [] Shortness of breath with exertion. 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   [] Non-healing ulcers Pulmonary:   [] Uses home oxygen   [] Productive cough   [] Hemoptysis   [] Wheeze  [] COPD   [] Asthma Neurologic:  [] Dizziness  [] Blackouts   [] Seizures   [] History of stroke   [] History of TIA  [] Aphasia   [] Temporary blindness   [] Dysphagia   [] Weakness or numbness in arms   [] Weakness or numbness in legs Musculoskeletal:  [x] Arthritis   [] Joint swelling   [x] Joint pain   [x] Low back pain Hematologic:  [] Easy bruising  [] Easy bleeding   [] Hypercoagulable state   [] Anemic   Gastrointestinal:  [] Blood in stool   [] Vomiting blood  [] Gastroesophageal reflux/heartburn   [] Abdominal pain Genitourinary:  [] Chronic kidney disease   [] Difficult urination  [] Frequent urination  [] Burning with urination   [] Hematuria Skin:  [] Rashes   [] Ulcers   [] Wounds Psychological:  [] History of anxiety   []  History of major depression.  Physical Examination  BP 121/67 (BP Location: Right Arm)   Pulse 62   Resp 16   Ht 4\' 10"  (1.473 m)   Wt 74.8 kg (165 lb)   BMI 34.49 kg/m  Gen:  WD/WN, NAD.  Appears younger than stated age Head: [place]/AT, No temporalis wasting. Ear/Nose/Throat: Hearing grossly intact, nares w/o erythema or drainage, trachea midline Eyes: Conjunctiva clear. Sclera non-icteric Neck: Supple.  No JVD.  Pulmonary:  Good air movement, no use of accessory muscles.  Cardiac: RRR, normal S1, S2 Vascular:  Vessel Right Left  Radial Palpable Palpable                                    Musculoskeletal: M/S 5/5 throughout.  No deformity or atrophy.  No right lower extremity edema, 1+ left lower extremity edema. Neurologic: Sensation grossly intact in extremities.  Symmetrical.  Speech is fluent.  Psychiatric: Judgment intact, Mood & affect appropriate for pt's clinical situation. Dermatologic: No rashes or ulcers noted.  No cellulitis or open wounds.       Labs No results found for this or any previous visit (from the past 2160  hour(s)).  Radiology Mr Cervical Spine Wo Contrast  Result Date: 03/30/2017 CLINICAL DATA:  Neck pain. Numbness and tingling in the right ring and small fingers. EXAM: MRI CERVICAL SPINE WITHOUT CONTRAST TECHNIQUE: Multiplanar, multisequence MR imaging of the cervical spine was performed. No intravenous contrast was administered. COMPARISON:  11/07/2015 FINDINGS: Alignment: Minimal anterolisthesis of C4 on  C5 and C7 on T1, unchanged. Vertebrae: No fracture, suspicious osseous lesion, or significant marrow edema. Unchanged focal fatty marrow or subcentimeter hemangioma in the C7 vertebral body. Cord: Normal signal and morphology. Posterior Fossa, vertebral arteries, paraspinal tissues: Unremarkable. Disc levels: C2-3: Mild left facet arthrosis without disc herniation or stenosis, unchanged. C3-4: Mild right and mild-to-moderate left facet arthrosis and minimal left uncovertebral spurring without significant stenosis, unchanged. C4-5: Mild disc space narrowing. Disc bulging, asymmetric left uncovertebral spurring, and mild-to-moderate left facet arthrosis result in mild left and borderline right neural foraminal stenosis without spinal stenosis, unchanged. C5-6: Moderate to severe disc space narrowing. Disc bulging, asymmetric right uncovertebral spurring, and mild facet arthrosis result in moderate right neural foraminal stenosis without spinal stenosis, unchanged. C6-7: Moderate disc space narrowing. Mild disc bulging and uncovertebral spurring without significant stenosis, unchanged. C7-T1: Mild left facet arthrosis without disc herniation or stenosis, unchanged. IMPRESSION: Unchanged cervical disc degeneration, greatest at C5-6 where there is moderate right neural foraminal stenosis. No spinal stenosis. Electronically Signed   By: Logan Bores M.D.   On: 03/30/2017 09:47    Assessment/Plan  Leg swelling Well-controlled.  Continue compression stockings, lymphedema pump, and  elevation.  Hypertension blood pressure control important in reducing the progression of atherosclerotic disease. On appropriate oral medications.   Lymphedema Well-controlled.  Continue compression stockings, elevation, and the lymphedema pump.  At this point, she can return on an as-needed basis if her symptoms worsen.    Leotis Pain, MD  04/26/2017 11:56 AM    This note was created with Dragon medical transcription system.  Any errors from dictation are purely unintentional

## 2017-04-26 NOTE — Assessment & Plan Note (Signed)
blood pressure control important in reducing the progression of atherosclerotic disease. On appropriate oral medications.  

## 2017-04-26 NOTE — Assessment & Plan Note (Signed)
Well-controlled.  Continue compression stockings, elevation, and the lymphedema pump.  At this point, she can return on an as-needed basis if her symptoms worsen.

## 2017-04-26 NOTE — Patient Instructions (Signed)

## 2017-04-26 NOTE — Assessment & Plan Note (Signed)
Well-controlled.  Continue compression stockings, lymphedema pump, and elevation.

## 2017-05-06 DIAGNOSIS — G4733 Obstructive sleep apnea (adult) (pediatric): Secondary | ICD-10-CM | POA: Diagnosis not present

## 2017-05-09 DIAGNOSIS — G4733 Obstructive sleep apnea (adult) (pediatric): Secondary | ICD-10-CM | POA: Diagnosis not present

## 2017-05-09 DIAGNOSIS — M436 Torticollis: Secondary | ICD-10-CM | POA: Diagnosis not present

## 2017-05-09 DIAGNOSIS — M6281 Muscle weakness (generalized): Secondary | ICD-10-CM | POA: Diagnosis not present

## 2017-05-09 DIAGNOSIS — M47812 Spondylosis without myelopathy or radiculopathy, cervical region: Secondary | ICD-10-CM | POA: Diagnosis not present

## 2017-05-09 DIAGNOSIS — M542 Cervicalgia: Secondary | ICD-10-CM | POA: Diagnosis not present

## 2017-05-10 DIAGNOSIS — H353131 Nonexudative age-related macular degeneration, bilateral, early dry stage: Secondary | ICD-10-CM | POA: Diagnosis not present

## 2017-05-13 DIAGNOSIS — M47812 Spondylosis without myelopathy or radiculopathy, cervical region: Secondary | ICD-10-CM | POA: Diagnosis not present

## 2017-05-18 DIAGNOSIS — R194 Change in bowel habit: Secondary | ICD-10-CM | POA: Diagnosis not present

## 2017-05-18 DIAGNOSIS — R197 Diarrhea, unspecified: Secondary | ICD-10-CM | POA: Diagnosis not present

## 2017-05-18 DIAGNOSIS — R1084 Generalized abdominal pain: Secondary | ICD-10-CM | POA: Diagnosis not present

## 2017-05-25 DIAGNOSIS — M47812 Spondylosis without myelopathy or radiculopathy, cervical region: Secondary | ICD-10-CM | POA: Diagnosis not present

## 2017-05-26 DIAGNOSIS — G4733 Obstructive sleep apnea (adult) (pediatric): Secondary | ICD-10-CM | POA: Diagnosis not present

## 2017-05-30 ENCOUNTER — Other Ambulatory Visit: Payer: Self-pay | Admitting: Internal Medicine

## 2017-05-30 DIAGNOSIS — R109 Unspecified abdominal pain: Secondary | ICD-10-CM

## 2017-06-06 DIAGNOSIS — G4733 Obstructive sleep apnea (adult) (pediatric): Secondary | ICD-10-CM | POA: Diagnosis not present

## 2017-06-08 DIAGNOSIS — E041 Nontoxic single thyroid nodule: Secondary | ICD-10-CM | POA: Diagnosis not present

## 2017-06-08 DIAGNOSIS — R600 Localized edema: Secondary | ICD-10-CM | POA: Diagnosis not present

## 2017-06-08 DIAGNOSIS — I878 Other specified disorders of veins: Secondary | ICD-10-CM | POA: Diagnosis not present

## 2017-06-08 DIAGNOSIS — R0602 Shortness of breath: Secondary | ICD-10-CM | POA: Diagnosis not present

## 2017-06-08 DIAGNOSIS — G4733 Obstructive sleep apnea (adult) (pediatric): Secondary | ICD-10-CM | POA: Diagnosis not present

## 2017-06-08 DIAGNOSIS — I10 Essential (primary) hypertension: Secondary | ICD-10-CM | POA: Diagnosis not present

## 2017-06-08 DIAGNOSIS — Z6835 Body mass index (BMI) 35.0-35.9, adult: Secondary | ICD-10-CM | POA: Diagnosis not present

## 2017-06-23 ENCOUNTER — Ambulatory Visit
Admission: RE | Admit: 2017-06-23 | Discharge: 2017-06-23 | Disposition: A | Discharge status: Home / Self Care | Payer: Medicare HMO | Source: Ambulatory Visit | Attending: Internal Medicine | Admitting: Internal Medicine

## 2017-06-23 DIAGNOSIS — Z90710 Acquired absence of both cervix and uterus: Secondary | ICD-10-CM | POA: Insufficient documentation

## 2017-06-23 DIAGNOSIS — R109 Unspecified abdominal pain: Secondary | ICD-10-CM | POA: Diagnosis not present

## 2017-06-23 DIAGNOSIS — R197 Diarrhea, unspecified: Secondary | ICD-10-CM | POA: Diagnosis not present

## 2017-06-23 DIAGNOSIS — K5730 Diverticulosis of large intestine without perforation or abscess without bleeding: Secondary | ICD-10-CM | POA: Diagnosis not present

## 2017-06-23 DIAGNOSIS — R1909 Other intra-abdominal and pelvic swelling, mass and lump: Secondary | ICD-10-CM | POA: Insufficient documentation

## 2017-06-23 DIAGNOSIS — K5732 Diverticulitis of large intestine without perforation or abscess without bleeding: Secondary | ICD-10-CM | POA: Insufficient documentation

## 2017-06-23 MED ORDER — IOPAMIDOL (ISOVUE-370) INJECTION 76%
75.0000 mL | Freq: Once | INTRAVENOUS | Status: AC | PRN
  Administered 2017-06-23: 75 mL via INTRAVENOUS

## 2017-06-24 DIAGNOSIS — I10 Essential (primary) hypertension: Secondary | ICD-10-CM | POA: Diagnosis not present

## 2017-06-24 DIAGNOSIS — E7800 Pure hypercholesterolemia, unspecified: Secondary | ICD-10-CM | POA: Diagnosis not present

## 2017-06-24 DIAGNOSIS — K5792 Diverticulitis of intestine, part unspecified, without perforation or abscess without bleeding: Secondary | ICD-10-CM | POA: Diagnosis not present

## 2017-06-24 DIAGNOSIS — Z79899 Other long term (current) drug therapy: Secondary | ICD-10-CM | POA: Diagnosis not present

## 2017-06-29 DIAGNOSIS — H26492 Other secondary cataract, left eye: Secondary | ICD-10-CM | POA: Diagnosis not present

## 2017-06-29 DIAGNOSIS — H26499 Other secondary cataract, unspecified eye: Secondary | ICD-10-CM | POA: Diagnosis not present

## 2017-07-01 DIAGNOSIS — M47812 Spondylosis without myelopathy or radiculopathy, cervical region: Secondary | ICD-10-CM | POA: Diagnosis not present

## 2017-07-01 DIAGNOSIS — M542 Cervicalgia: Secondary | ICD-10-CM | POA: Diagnosis not present

## 2017-07-01 DIAGNOSIS — M6281 Muscle weakness (generalized): Secondary | ICD-10-CM | POA: Diagnosis not present

## 2017-07-01 DIAGNOSIS — M436 Torticollis: Secondary | ICD-10-CM | POA: Diagnosis not present

## 2017-07-04 DIAGNOSIS — M47812 Spondylosis without myelopathy or radiculopathy, cervical region: Secondary | ICD-10-CM | POA: Diagnosis not present

## 2017-07-07 DIAGNOSIS — G4733 Obstructive sleep apnea (adult) (pediatric): Secondary | ICD-10-CM | POA: Diagnosis not present

## 2017-07-08 DIAGNOSIS — M47812 Spondylosis without myelopathy or radiculopathy, cervical region: Secondary | ICD-10-CM | POA: Diagnosis not present

## 2017-07-11 DIAGNOSIS — M47812 Spondylosis without myelopathy or radiculopathy, cervical region: Secondary | ICD-10-CM | POA: Diagnosis not present

## 2017-07-11 DIAGNOSIS — I10 Essential (primary) hypertension: Secondary | ICD-10-CM | POA: Diagnosis not present

## 2017-07-15 DIAGNOSIS — M47812 Spondylosis without myelopathy or radiculopathy, cervical region: Secondary | ICD-10-CM | POA: Diagnosis not present

## 2017-07-21 DIAGNOSIS — E7800 Pure hypercholesterolemia, unspecified: Secondary | ICD-10-CM | POA: Diagnosis not present

## 2017-07-21 DIAGNOSIS — G4733 Obstructive sleep apnea (adult) (pediatric): Secondary | ICD-10-CM | POA: Diagnosis not present

## 2017-07-21 DIAGNOSIS — M47812 Spondylosis without myelopathy or radiculopathy, cervical region: Secondary | ICD-10-CM | POA: Diagnosis not present

## 2017-07-27 DIAGNOSIS — M47812 Spondylosis without myelopathy or radiculopathy, cervical region: Secondary | ICD-10-CM | POA: Diagnosis not present

## 2017-08-02 DIAGNOSIS — M47812 Spondylosis without myelopathy or radiculopathy, cervical region: Secondary | ICD-10-CM | POA: Diagnosis not present

## 2017-08-04 DIAGNOSIS — M47812 Spondylosis without myelopathy or radiculopathy, cervical region: Secondary | ICD-10-CM | POA: Diagnosis not present

## 2017-08-04 DIAGNOSIS — G4733 Obstructive sleep apnea (adult) (pediatric): Secondary | ICD-10-CM | POA: Diagnosis not present

## 2017-08-08 DIAGNOSIS — G4733 Obstructive sleep apnea (adult) (pediatric): Secondary | ICD-10-CM | POA: Diagnosis not present

## 2017-08-08 DIAGNOSIS — M47812 Spondylosis without myelopathy or radiculopathy, cervical region: Secondary | ICD-10-CM | POA: Diagnosis not present

## 2017-08-11 DIAGNOSIS — M47812 Spondylosis without myelopathy or radiculopathy, cervical region: Secondary | ICD-10-CM | POA: Diagnosis not present

## 2017-08-15 DIAGNOSIS — M47812 Spondylosis without myelopathy or radiculopathy, cervical region: Secondary | ICD-10-CM | POA: Diagnosis not present

## 2017-08-17 DIAGNOSIS — M47812 Spondylosis without myelopathy or radiculopathy, cervical region: Secondary | ICD-10-CM | POA: Diagnosis not present

## 2017-08-24 DIAGNOSIS — M47812 Spondylosis without myelopathy or radiculopathy, cervical region: Secondary | ICD-10-CM | POA: Diagnosis not present

## 2017-09-02 DIAGNOSIS — M47812 Spondylosis without myelopathy or radiculopathy, cervical region: Secondary | ICD-10-CM | POA: Diagnosis not present

## 2017-09-04 DIAGNOSIS — G4733 Obstructive sleep apnea (adult) (pediatric): Secondary | ICD-10-CM | POA: Diagnosis not present

## 2017-09-09 DIAGNOSIS — M47812 Spondylosis without myelopathy or radiculopathy, cervical region: Secondary | ICD-10-CM | POA: Diagnosis not present

## 2017-09-14 DIAGNOSIS — I10 Essential (primary) hypertension: Secondary | ICD-10-CM | POA: Diagnosis not present

## 2017-09-14 DIAGNOSIS — E559 Vitamin D deficiency, unspecified: Secondary | ICD-10-CM | POA: Diagnosis not present

## 2017-09-14 DIAGNOSIS — E041 Nontoxic single thyroid nodule: Secondary | ICD-10-CM | POA: Diagnosis not present

## 2017-09-14 DIAGNOSIS — E7800 Pure hypercholesterolemia, unspecified: Secondary | ICD-10-CM | POA: Diagnosis not present

## 2017-09-14 DIAGNOSIS — Z79899 Other long term (current) drug therapy: Secondary | ICD-10-CM | POA: Diagnosis not present

## 2017-09-19 DIAGNOSIS — M47812 Spondylosis without myelopathy or radiculopathy, cervical region: Secondary | ICD-10-CM | POA: Diagnosis not present

## 2017-09-21 DIAGNOSIS — Z0000 Encounter for general adult medical examination without abnormal findings: Secondary | ICD-10-CM | POA: Diagnosis not present

## 2017-09-21 DIAGNOSIS — E7800 Pure hypercholesterolemia, unspecified: Secondary | ICD-10-CM | POA: Diagnosis not present

## 2017-09-21 DIAGNOSIS — G4733 Obstructive sleep apnea (adult) (pediatric): Secondary | ICD-10-CM | POA: Diagnosis not present

## 2017-09-21 DIAGNOSIS — E041 Nontoxic single thyroid nodule: Secondary | ICD-10-CM | POA: Diagnosis not present

## 2017-09-21 DIAGNOSIS — R0602 Shortness of breath: Secondary | ICD-10-CM | POA: Diagnosis not present

## 2017-09-21 DIAGNOSIS — I10 Essential (primary) hypertension: Secondary | ICD-10-CM | POA: Diagnosis not present

## 2017-09-26 DIAGNOSIS — M47812 Spondylosis without myelopathy or radiculopathy, cervical region: Secondary | ICD-10-CM | POA: Diagnosis not present

## 2017-10-04 DIAGNOSIS — G4733 Obstructive sleep apnea (adult) (pediatric): Secondary | ICD-10-CM | POA: Diagnosis not present

## 2017-10-10 DIAGNOSIS — I878 Other specified disorders of veins: Secondary | ICD-10-CM | POA: Diagnosis not present

## 2017-10-10 DIAGNOSIS — I10 Essential (primary) hypertension: Secondary | ICD-10-CM | POA: Diagnosis not present

## 2017-10-10 DIAGNOSIS — R0602 Shortness of breath: Secondary | ICD-10-CM | POA: Diagnosis not present

## 2017-10-12 DIAGNOSIS — M47812 Spondylosis without myelopathy or radiculopathy, cervical region: Secondary | ICD-10-CM | POA: Diagnosis not present

## 2017-10-19 DIAGNOSIS — R0602 Shortness of breath: Secondary | ICD-10-CM | POA: Diagnosis not present

## 2017-10-19 DIAGNOSIS — I878 Other specified disorders of veins: Secondary | ICD-10-CM | POA: Diagnosis not present

## 2017-10-19 DIAGNOSIS — I10 Essential (primary) hypertension: Secondary | ICD-10-CM | POA: Diagnosis not present

## 2017-10-25 DIAGNOSIS — I878 Other specified disorders of veins: Secondary | ICD-10-CM | POA: Diagnosis not present

## 2017-10-25 DIAGNOSIS — I890 Lymphedema, not elsewhere classified: Secondary | ICD-10-CM | POA: Diagnosis not present

## 2017-10-25 DIAGNOSIS — I10 Essential (primary) hypertension: Secondary | ICD-10-CM | POA: Diagnosis not present

## 2017-11-04 DIAGNOSIS — G4733 Obstructive sleep apnea (adult) (pediatric): Secondary | ICD-10-CM | POA: Diagnosis not present

## 2017-11-08 DIAGNOSIS — G4733 Obstructive sleep apnea (adult) (pediatric): Secondary | ICD-10-CM | POA: Diagnosis not present

## 2017-11-18 ENCOUNTER — Encounter (INDEPENDENT_AMBULATORY_CARE_PROVIDER_SITE_OTHER): Payer: Self-pay | Admitting: Vascular Surgery

## 2017-11-18 ENCOUNTER — Ambulatory Visit (INDEPENDENT_AMBULATORY_CARE_PROVIDER_SITE_OTHER): Payer: Medicare HMO | Admitting: Vascular Surgery

## 2017-11-18 VITALS — BP 112/70 | HR 69 | Resp 13 | Ht <= 58 in | Wt 168.0 lb

## 2017-11-18 DIAGNOSIS — I890 Lymphedema, not elsewhere classified: Secondary | ICD-10-CM | POA: Diagnosis not present

## 2017-11-18 DIAGNOSIS — I10 Essential (primary) hypertension: Secondary | ICD-10-CM | POA: Diagnosis not present

## 2017-11-18 DIAGNOSIS — M7989 Other specified soft tissue disorders: Secondary | ICD-10-CM | POA: Diagnosis not present

## 2017-11-18 NOTE — Progress Notes (Signed)
MRN : [id number]  Lindsey Thomas is a 77 y.o. ([birth year]) female who presents with chief complaint of  Chief Complaint  Patient presents with  . Follow-up    Lymphedema consult  .  History of Present Illness: Patient returns today in follow up of lymphedema with worsening left leg swelling on the request of her primary care physician.  I saw her last November and her legs are actually doing quite well.  However, in December, she developed a severe illness and had hyponatremia and multiple other issues at that time.  Following that, her leg swelling worsened.  She says they are doing better now with use of her compression device as well as increasing her Lasix.  Her left leg has the predominant swelling.  Current Outpatient Medications  Medication Sig Dispense Refill  . aspirin EC 81 MG tablet Take 81 mg by mouth daily.    . budesonide-formoterol (SYMBICORT) 160-4.5 MCG/ACT inhaler Inhale 2 puffs into the lungs 2 (two) times daily.    . Cholecalciferol (VITAMIN D PO) Take 5,000 Units by mouth daily.    . Cyanocobalamin (VITAMIN B-12 PO) Take 2 tablets by mouth daily.    . fluticasone (FLONASE) 50 MCG/ACT nasal spray Place 1 spray into both nostrils daily.    . furosemide (LASIX) 20 MG tablet Take by mouth.     . loratadine (CLARITIN) 10 MG tablet Take 10 mg by mouth daily.    . metoprolol succinate (TOPROL-XL) 25 MG 24 hr tablet     . montelukast (SINGULAIR) 10 MG tablet Take 10 mg by mouth at bedtime.     . Multiple Vitamins-Minerals (PRESERVISION AREDS PO) Take 1 capsule by mouth 2 (two) times daily.    . Azelastine HCl 137 MCG/SPRAY SOLN     . Naproxen Sodium (ALEVE PO) Take 1 tablet by mouth as needed.     Marland Kitchen omeprazole (PRILOSEC) 20 MG capsule Take 20 mg by mouth 2 (two) times daily before a meal.     No current facility-administered medications for this visit.     Past Medical History:  Diagnosis Date  . Anxiety   . Arthritis   . Asthma   . Edema    feet  . GERD  (gastroesophageal reflux disease)   . Headache    MIGRAINES  . History of orthopnea   . Hypercholesterolemia   . Hypertension   . Hypothyroidism    NODULES  . Lymphedema   . Migraine   . Shortness of breath dyspnea    WITH EXERTION  . Sleep apnea    MILD, DOES NOT USE CPAP  . Thyroid nodule     Past Surgical History:  Procedure Laterality Date  . ABDOMINAL HYSTERECTOMY    . CARDIAC CATHETERIZATION    . CATARACT EXTRACTION W/PHACO Left 02/12/2016   Procedure: CATARACT EXTRACTION PHACO AND INTRAOCULAR LENS PLACEMENT (IOC);  Surgeon: Eulogio Bear, MD;  Location: [facility];  Service: Ophthalmology;  Laterality: Left;  [place] 01:30 AP% 15.2 CDE 13.71 Fluid pack lot # [id number]  . CATARACT EXTRACTION W/PHACO Right 03/18/2016   Procedure: CATARACT EXTRACTION PHACO AND INTRAOCULAR LENS PLACEMENT ([place]);  Surgeon: Eulogio Bear, MD;  Location: [facility];  Service: Ophthalmology;  Laterality: Right;  Lot # [id number] H [place]: 01:05.5 AP%:14.3 CDE: 9.33  . FRACTURE SURGERY Left    arm rod and screw  . KNEE ARTHROSCOPY    . OOPHORECTOMY    . RCR    . ROTATOR CUFF REPAIR Left 2006  Social History       Tobacco Use  . Smoking status: Never Smoker  . Smokeless tobacco: Never Used  Substance Use Topics  . Alcohol use: No  . Drug use: Not on file     Family History      Family History  Problem Relation Age of Onset  . Breast cancer Neg Hx          Allergies  Allergen Reactions  . Augmentin [Amoxicillin-Pot Clavulanate]   . Celecoxib   . Eryc [Erythromycin]   . Percocet [Oxycodone-Acetaminophen]      REVIEW OF SYSTEMS (Negative unless checked)  Constitutional: [] Weight loss  [] Fever  [] Chills Cardiac: [] Chest pain   [] Chest pressure   [] Palpitations   [] Shortness of breath when laying flat   [] Shortness of breath at rest   [] Shortness of breath with exertion. 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   [] Non-healing ulcers Pulmonary:   [] Uses home oxygen   [] Productive cough   [] Hemoptysis   [] Wheeze  [] COPD   [] Asthma Neurologic:  [] Dizziness  [] Blackouts   [] Seizures   [] History of stroke   [] History of TIA  [] Aphasia   [] Temporary blindness   [] Dysphagia   [] Weakness or numbness in arms   [] Weakness or numbness in legs Musculoskeletal:  [x] Arthritis   [] Joint swelling   [x] Joint pain   [x] Low back pain Hematologic:  [] Easy bruising  [] Easy bleeding   [] Hypercoagulable state   [] Anemic   Gastrointestinal:  [] Blood in stool   [] Vomiting blood  [] Gastroesophageal reflux/heartburn   [] Abdominal pain Genitourinary:  [] Chronic kidney disease   [] Difficult urination  [] Frequent urination  [] Burning with urination   [] Hematuria Skin:  [] Rashes   [] Ulcers   [] Wounds Psychological:  [] History of anxiety   []  History of major depression.    Physical Examination  BP 112/70 (BP Location: Right Arm, Patient Position: Sitting)   Pulse 69   Resp 13   Ht 4\' 10"  (1.473 m)   Wt 168 lb (76.2 kg)   BMI 35.11 kg/m  Gen:  WD/WN, NAD.  Appears younger than stated age Head: [place]/AT, No temporalis wasting. Ear/Nose/Throat: Hearing grossly intact, nares w/o erythema or drainage Eyes: Conjunctiva clear. Sclera non-icteric Neck: Supple.  Trachea midline Pulmonary:  Good air movement, no use of accessory muscles.  Cardiac: RRR, no JVD Vascular:  Vessel Right Left  Radial Palpable Palpable                          PT  1+ palpable  not palpable  DP Palpable  1+ palpable    Musculoskeletal: M/S 5/5 throughout.  No deformity or atrophy.  Trace right lower extremity edema, 1-2+ left lower extremity edema. Neurologic: Sensation grossly intact in extremities.  Symmetrical.  Speech is fluent.  Psychiatric: Judgment intact,  Mood & affect appropriate for pt's clinical situation. Dermatologic: No rashes or ulcers noted.  No cellulitis or open wounds.       Labs No results found for this or any previous visit (from the past 2160 hour(s)).  Radiology No results found.  Assessment/Plan Leg swelling Worsened after an illness last winter.  Her  swelling is currently a little bit better than it was earlier this year.  Continue compression stockings, lymphedema pump, and elevation.  Hypertension blood pressure control important in reducing the progression of atherosclerotic disease. On appropriate oral medications.  Lymphedema Continue using her lymphedema pump, elevating her legs, and using compression stockings.  We discussed exercises that should be of benefit.  She will continue her Lasix for now. RTC in six months    Leotis Pain, MD  11/18/2017 12:30 PM    This note was created with Dragon medical transcription system.  Any errors from dictation are purely unintentional

## 2017-11-18 NOTE — Patient Instructions (Signed)

## 2017-11-18 NOTE — Assessment & Plan Note (Addendum)
Continue using her lymphedema pump, elevating her legs, and using compression stockings.  We discussed exercises that should be of benefit.  She will continue her Lasix for now. RTC in six months

## 2017-11-24 DIAGNOSIS — J4520 Mild intermittent asthma, uncomplicated: Secondary | ICD-10-CM | POA: Diagnosis not present

## 2017-11-24 DIAGNOSIS — R0609 Other forms of dyspnea: Secondary | ICD-10-CM | POA: Diagnosis not present

## 2017-11-24 DIAGNOSIS — G4733 Obstructive sleep apnea (adult) (pediatric): Secondary | ICD-10-CM | POA: Diagnosis not present

## 2017-11-29 ENCOUNTER — Other Ambulatory Visit: Payer: Self-pay | Admitting: Internal Medicine

## 2017-11-29 DIAGNOSIS — Z1231 Encounter for screening mammogram for malignant neoplasm of breast: Secondary | ICD-10-CM

## 2018-01-03 DIAGNOSIS — L2089 Other atopic dermatitis: Secondary | ICD-10-CM | POA: Diagnosis not present

## 2018-01-03 DIAGNOSIS — L821 Other seborrheic keratosis: Secondary | ICD-10-CM | POA: Diagnosis not present

## 2018-01-03 DIAGNOSIS — Z1283 Encounter for screening for malignant neoplasm of skin: Secondary | ICD-10-CM | POA: Diagnosis not present

## 2018-01-03 DIAGNOSIS — L578 Other skin changes due to chronic exposure to nonionizing radiation: Secondary | ICD-10-CM | POA: Diagnosis not present

## 2018-01-03 DIAGNOSIS — Z86018 Personal history of other benign neoplasm: Secondary | ICD-10-CM | POA: Diagnosis not present

## 2018-01-03 DIAGNOSIS — Z872 Personal history of diseases of the skin and subcutaneous tissue: Secondary | ICD-10-CM | POA: Diagnosis not present

## 2018-01-11 ENCOUNTER — Ambulatory Visit
Admission: RE | Admit: 2018-01-11 | Discharge: 2018-01-11 | Disposition: A | Discharge status: Home / Self Care | Payer: Medicare HMO | Source: Ambulatory Visit | Attending: Internal Medicine | Admitting: Internal Medicine

## 2018-01-11 DIAGNOSIS — Z1231 Encounter for screening mammogram for malignant neoplasm of breast: Secondary | ICD-10-CM | POA: Diagnosis not present

## 2018-01-23 DIAGNOSIS — G4733 Obstructive sleep apnea (adult) (pediatric): Secondary | ICD-10-CM | POA: Diagnosis not present

## 2018-01-23 DIAGNOSIS — Z6835 Body mass index (BMI) 35.0-35.9, adult: Secondary | ICD-10-CM | POA: Diagnosis not present

## 2018-01-23 DIAGNOSIS — I10 Essential (primary) hypertension: Secondary | ICD-10-CM | POA: Diagnosis not present

## 2018-01-23 DIAGNOSIS — E7800 Pure hypercholesterolemia, unspecified: Secondary | ICD-10-CM | POA: Diagnosis not present

## 2018-02-07 DIAGNOSIS — E041 Nontoxic single thyroid nodule: Secondary | ICD-10-CM | POA: Diagnosis not present

## 2018-02-07 DIAGNOSIS — I878 Other specified disorders of veins: Secondary | ICD-10-CM | POA: Diagnosis not present

## 2018-02-07 DIAGNOSIS — Z79899 Other long term (current) drug therapy: Secondary | ICD-10-CM | POA: Diagnosis not present

## 2018-02-07 DIAGNOSIS — I10 Essential (primary) hypertension: Secondary | ICD-10-CM | POA: Diagnosis not present

## 2018-02-07 DIAGNOSIS — E7800 Pure hypercholesterolemia, unspecified: Secondary | ICD-10-CM | POA: Diagnosis not present

## 2018-02-08 DIAGNOSIS — G4733 Obstructive sleep apnea (adult) (pediatric): Secondary | ICD-10-CM | POA: Diagnosis not present

## 2018-03-31 DIAGNOSIS — Z23 Encounter for immunization: Secondary | ICD-10-CM | POA: Diagnosis not present

## 2018-05-10 DIAGNOSIS — G4733 Obstructive sleep apnea (adult) (pediatric): Secondary | ICD-10-CM | POA: Diagnosis not present

## 2018-05-19 ENCOUNTER — Ambulatory Visit (INDEPENDENT_AMBULATORY_CARE_PROVIDER_SITE_OTHER): Payer: Medicare HMO | Admitting: Vascular Surgery

## 2018-05-19 ENCOUNTER — Encounter (INDEPENDENT_AMBULATORY_CARE_PROVIDER_SITE_OTHER): Payer: Self-pay | Admitting: Vascular Surgery

## 2018-05-19 VITALS — BP 137/75 | HR 76 | Resp 18 | Ht <= 58 in | Wt 172.2 lb

## 2018-05-19 DIAGNOSIS — I10 Essential (primary) hypertension: Secondary | ICD-10-CM

## 2018-05-19 DIAGNOSIS — I890 Lymphedema, not elsewhere classified: Secondary | ICD-10-CM

## 2018-05-19 DIAGNOSIS — M7989 Other specified soft tissue disorders: Secondary | ICD-10-CM

## 2018-05-19 DIAGNOSIS — R600 Localized edema: Secondary | ICD-10-CM

## 2018-05-19 NOTE — Assessment & Plan Note (Signed)
Overall well controlled and better.  She is actually not using her lymphedema pump as she has not needed it recently.  She is wearing compression stockings and that is working well.  She has had some knee pain, but not a lot of discomfort in her lower legs.  At this point, we will plan on just seeing her back as needed or on an annual basis.  She will contact our office with any worsening symptoms in the interim.

## 2018-05-19 NOTE — Progress Notes (Signed)
MRN : [id number]  Lindsey Thomas is a 77 y.o. (22-Dec-1940) female who presents with chief complaint of  Chief Complaint  Patient presents with  . Follow-up  .  History of Present Illness: Patient returns today in follow up of leg swelling and lymphedema.  This seems to be doing quite well.  She says she is not using her lymphedema pump currently because she has not really needed it.  She has been having some knee pain intermittently which is likely arthritic in nature.  She remains on a diuretic by her primary care physician.  No swelling in the right leg.  The swelling in the left leg remains mild.  No new ulceration or infection.  Current Outpatient Medications  Medication Sig Dispense Refill  . aspirin EC 81 MG tablet Take 81 mg by mouth daily.    . budesonide-formoterol (SYMBICORT) 160-4.5 MCG/ACT inhaler Inhale 2 puffs into the lungs 2 (two) times daily.    . Cholecalciferol (VITAMIN D PO) Take 5,000 Units by mouth daily.    . Cyanocobalamin (VITAMIN B-12 PO) Take 2 tablets by mouth daily.    . furosemide (LASIX) 20 MG tablet Take by mouth.     . loratadine (CLARITIN) 10 MG tablet Take 10 mg by mouth daily.    . montelukast (SINGULAIR) 10 MG tablet Take 10 mg by mouth at bedtime.     . Multiple Vitamins-Minerals (PRESERVISION AREDS PO) Take 1 capsule by mouth 2 (two) times daily.    . Naproxen Sodium (ALEVE PO) Take 1 tablet by mouth as needed.     Marland Kitchen omeprazole (PRILOSEC) 20 MG capsule Take 20 mg by mouth 2 (two) times daily before a meal.     No current facility-administered medications for this visit.     Past Medical History:  Diagnosis Date  . Anxiety   . Arthritis   . Asthma   . Edema    feet  . GERD (gastroesophageal reflux disease)   . Headache    MIGRAINES  . History of orthopnea   . Hypercholesterolemia   . Hypertension   . Hypothyroidism    NODULES  . Lymphedema   . Migraine   . Shortness of breath dyspnea    WITH EXERTION  . Sleep apnea    MILD,  DOES NOT USE CPAP  . Thyroid nodule     Past Surgical History:  Procedure Laterality Date  . ABDOMINAL HYSTERECTOMY    . CARDIAC CATHETERIZATION    . CATARACT EXTRACTION W/PHACO Left 02/12/2016   Procedure: CATARACT EXTRACTION PHACO AND INTRAOCULAR LENS PLACEMENT (IOC);  Surgeon: Eulogio Bear, MD;  Location: [facility];  Service: Ophthalmology;  Laterality: Left;  [place] 01:30 AP% 15.2 CDE 13.71 Fluid pack lot # [id number]  . CATARACT EXTRACTION W/PHACO Right 03/18/2016   Procedure: CATARACT EXTRACTION PHACO AND INTRAOCULAR LENS PLACEMENT ([place]);  Surgeon: Eulogio Bear, MD;  Location: [facility];  Service: Ophthalmology;  Laterality: Right;  Lot # [id number] H [place]: 01:05.5 AP%:14.3 CDE: 9.33  . FRACTURE SURGERY Left    arm rod and screw  . KNEE ARTHROSCOPY    . OOPHORECTOMY    . RCR    . ROTATOR CUFF REPAIR Left 2006    Social History       Tobacco Use  . Smoking status: Never Smoker  . Smokeless tobacco: Never Used  Substance Use Topics  . Alcohol use: No  . Drug use: Not on file     Family History  Family History  Problem Relation Age of Onset  . Breast cancer Neg Hx          Allergies  Allergen Reactions  . Augmentin [Amoxicillin-Pot Clavulanate]   . Celecoxib   . Eryc [Erythromycin]   . Percocet [Oxycodone-Acetaminophen]      REVIEW OF SYSTEMS(Negative unless checked)  Constitutional: [] ?Weight loss[] ?Fever[] ?Chills Cardiac:[] ?Chest pain[] ?Chest pressure[] ?Palpitations [] ?Shortness of breath when laying flat [] ?Shortness of breath at rest [] ?Shortness of breath with exertion. Vascular: [] ?Pain in legs with walking[] ?Pain in legsat rest[] ?Pain in legs when laying flat [] ?Claudication [] ?Pain in feet when walking [] ?Pain in feet at rest [] ?Pain in feet when laying flat [] ?History of DVT [] ?Phlebitis [x] ?Swelling in legs [x] ?Varicose veins [] ?Non-healing ulcers Pulmonary: [] ?Uses home  oxygen [] ?Productive cough[] ?Hemoptysis [] ?Wheeze [] ?COPD [] ?Asthma Neurologic: [] ?Dizziness [] ?Blackouts [] ?Seizures [] ?History of stroke [] ?History of TIA[] ?Aphasia [] ?Temporary blindness[] ?Dysphagia [] ?Weaknessor numbness in arms [] ?Weakness or numbnessin legs Musculoskeletal: [x] ?Arthritis [] ?Joint swelling [x] ?Joint pain [x] ?Low back pain Hematologic:[] ?Easy bruising[] ?Easy bleeding [] ?Hypercoagulable state [] ?Anemic  Gastrointestinal:[] ?Blood in stool[] ?Vomiting blood[] ?Gastroesophageal reflux/heartburn[] ?Abdominal pain Genitourinary: [] ?Chronic kidney disease [] ?Difficulturination [] ?Frequenturination [] ?Burning with urination[] ?Hematuria Skin: [] ?Rashes [] ?Ulcers [] ?Wounds Psychological: [] ?History of anxiety[] ?History of major depression.    Physical Examination  BP 137/75 (BP Location: Right Arm, Patient Position: Sitting)   Pulse 76   Resp 18   Ht 4\' 10"  (1.473 m)   Wt 172 lb 3.2 oz (78.1 kg)   BMI 35.99 kg/m  Gen:  WD/WN, NAD Head: [place]/AT, No temporalis wasting. Ear/Nose/Throat: Hearing grossly intact, nares w/o erythema or drainage Eyes: Conjunctiva clear. Sclera non-icteric Neck: Supple.  Trachea midline Pulmonary:  Good air movement, no use of accessory muscles.  Cardiac: RRR, no JVD Vascular:  Vessel Right Left  Radial Palpable Palpable                          PT Palpable Palpable  DP Palpable Palpable    Musculoskeletal: M/S 5/5 throughout.  No deformity or atrophy.  No right lower extremity edema.  Trace to 1+ left lower extremity edema. Neurologic: Sensation grossly intact in extremities.  Symmetrical.  Speech is fluent.  Psychiatric: Judgment intact, Mood & affect appropriate for pt's clinical situation. Dermatologic: No rashes or ulcers noted.  No cellulitis or open wounds.       Labs No results found for this or any previous visit (from the past 2160  hour(s)).  Radiology No results found.  Assessment/Plan Leg swelling Worsened after an illness last winter.  Her swelling is currently a little bit better than it was earlier this year.  Continue compression stockings, lymphedema pump, and elevation.  Hypertension blood pressure control important in reducing the progression of atherosclerotic disease. On appropriate oral medications.  Lymphedema Overall well controlled and better.  She is actually not using her lymphedema pump as she has not needed it recently.  She is wearing compression stockings and that is working well.  She has had some knee pain, but not a lot of discomfort in her lower legs.  At this point, we will plan on just seeing her back as needed or on an annual basis.  She will contact our office with any worsening symptoms in the interim.    Leotis Pain, MD  05/19/2018 11:34 AM    This note was created with Dragon medical transcription system.  Any errors from dictation are purely unintentional

## 2018-05-23 DIAGNOSIS — Z79899 Other long term (current) drug therapy: Secondary | ICD-10-CM | POA: Diagnosis not present

## 2018-05-23 DIAGNOSIS — R0609 Other forms of dyspnea: Secondary | ICD-10-CM | POA: Diagnosis not present

## 2018-05-23 DIAGNOSIS — E7800 Pure hypercholesterolemia, unspecified: Secondary | ICD-10-CM | POA: Diagnosis not present

## 2018-05-23 DIAGNOSIS — M25551 Pain in right hip: Secondary | ICD-10-CM | POA: Diagnosis not present

## 2018-05-23 DIAGNOSIS — J439 Emphysema, unspecified: Secondary | ICD-10-CM | POA: Diagnosis not present

## 2018-05-23 DIAGNOSIS — G4733 Obstructive sleep apnea (adult) (pediatric): Secondary | ICD-10-CM | POA: Diagnosis not present

## 2018-05-23 DIAGNOSIS — I878 Other specified disorders of veins: Secondary | ICD-10-CM | POA: Diagnosis not present

## 2018-05-23 DIAGNOSIS — E041 Nontoxic single thyroid nodule: Secondary | ICD-10-CM | POA: Diagnosis not present

## 2018-07-24 ENCOUNTER — Encounter: Admission: RE | Payer: Self-pay | Source: Home / Self Care

## 2018-07-24 ENCOUNTER — Ambulatory Visit
Admission: RE | Admit: 2018-07-24 | Payer: Medicare HMO | Source: Home / Self Care | Admitting: Unknown Physician Specialty

## 2018-07-24 SURGERY — COLONOSCOPY WITH PROPOFOL
Anesthesia: General

## 2018-09-26 ENCOUNTER — Other Ambulatory Visit: Payer: Self-pay | Admitting: Internal Medicine

## 2018-09-26 DIAGNOSIS — Z1231 Encounter for screening mammogram for malignant neoplasm of breast: Secondary | ICD-10-CM

## 2018-11-10 DIAGNOSIS — G4733 Obstructive sleep apnea (adult) (pediatric): Secondary | ICD-10-CM | POA: Diagnosis not present

## 2018-11-22 DIAGNOSIS — R0600 Dyspnea, unspecified: Secondary | ICD-10-CM | POA: Diagnosis not present

## 2018-11-22 DIAGNOSIS — J439 Emphysema, unspecified: Secondary | ICD-10-CM | POA: Diagnosis not present

## 2018-11-22 DIAGNOSIS — J4520 Mild intermittent asthma, uncomplicated: Secondary | ICD-10-CM | POA: Diagnosis not present

## 2018-11-22 DIAGNOSIS — G4733 Obstructive sleep apnea (adult) (pediatric): Secondary | ICD-10-CM | POA: Diagnosis not present

## 2018-12-03 DIAGNOSIS — C3490 Malignant neoplasm of unspecified part of unspecified bronchus or lung: Secondary | ICD-10-CM | POA: Insufficient documentation

## 2019-01-17 DIAGNOSIS — Z6835 Body mass index (BMI) 35.0-35.9, adult: Secondary | ICD-10-CM | POA: Diagnosis not present

## 2019-01-17 DIAGNOSIS — G4733 Obstructive sleep apnea (adult) (pediatric): Secondary | ICD-10-CM | POA: Diagnosis not present

## 2019-01-17 DIAGNOSIS — F419 Anxiety disorder, unspecified: Secondary | ICD-10-CM | POA: Diagnosis not present

## 2019-01-18 DIAGNOSIS — L578 Other skin changes due to chronic exposure to nonionizing radiation: Secondary | ICD-10-CM | POA: Diagnosis not present

## 2019-01-18 DIAGNOSIS — Z86018 Personal history of other benign neoplasm: Secondary | ICD-10-CM | POA: Diagnosis not present

## 2019-01-18 DIAGNOSIS — Z872 Personal history of diseases of the skin and subcutaneous tissue: Secondary | ICD-10-CM | POA: Diagnosis not present

## 2019-01-23 ENCOUNTER — Ambulatory Visit
Admission: RE | Admit: 2019-01-23 | Discharge: 2019-01-23 | Disposition: A | Discharge status: Home / Self Care | Payer: Medicare HMO | Source: Ambulatory Visit | Attending: Internal Medicine | Admitting: Internal Medicine

## 2019-01-23 DIAGNOSIS — Z1231 Encounter for screening mammogram for malignant neoplasm of breast: Secondary | ICD-10-CM | POA: Diagnosis not present

## 2019-02-13 DIAGNOSIS — G4733 Obstructive sleep apnea (adult) (pediatric): Secondary | ICD-10-CM | POA: Diagnosis not present

## 2019-02-16 DIAGNOSIS — H353132 Nonexudative age-related macular degeneration, bilateral, intermediate dry stage: Secondary | ICD-10-CM | POA: Diagnosis not present

## 2019-03-21 DIAGNOSIS — Z79899 Other long term (current) drug therapy: Secondary | ICD-10-CM | POA: Diagnosis not present

## 2019-03-21 DIAGNOSIS — E7800 Pure hypercholesterolemia, unspecified: Secondary | ICD-10-CM | POA: Diagnosis not present

## 2019-03-21 DIAGNOSIS — R7309 Other abnormal glucose: Secondary | ICD-10-CM | POA: Diagnosis not present

## 2019-03-28 DIAGNOSIS — G4733 Obstructive sleep apnea (adult) (pediatric): Secondary | ICD-10-CM | POA: Diagnosis not present

## 2019-03-28 DIAGNOSIS — R0600 Dyspnea, unspecified: Secondary | ICD-10-CM | POA: Diagnosis not present

## 2019-03-28 DIAGNOSIS — Z9989 Dependence on other enabling machines and devices: Secondary | ICD-10-CM | POA: Diagnosis not present

## 2019-03-28 DIAGNOSIS — R600 Localized edema: Secondary | ICD-10-CM | POA: Diagnosis not present

## 2019-03-28 DIAGNOSIS — Z23 Encounter for immunization: Secondary | ICD-10-CM | POA: Diagnosis not present

## 2019-03-28 DIAGNOSIS — E7800 Pure hypercholesterolemia, unspecified: Secondary | ICD-10-CM | POA: Diagnosis not present

## 2019-03-28 DIAGNOSIS — R079 Chest pain, unspecified: Secondary | ICD-10-CM | POA: Diagnosis not present

## 2019-03-28 DIAGNOSIS — I890 Lymphedema, not elsewhere classified: Secondary | ICD-10-CM | POA: Diagnosis not present

## 2019-04-09 DIAGNOSIS — E7800 Pure hypercholesterolemia, unspecified: Secondary | ICD-10-CM | POA: Diagnosis not present

## 2019-04-09 DIAGNOSIS — G4733 Obstructive sleep apnea (adult) (pediatric): Secondary | ICD-10-CM | POA: Diagnosis not present

## 2019-04-09 DIAGNOSIS — R918 Other nonspecific abnormal finding of lung field: Secondary | ICD-10-CM | POA: Diagnosis not present

## 2019-04-09 DIAGNOSIS — R0600 Dyspnea, unspecified: Secondary | ICD-10-CM | POA: Diagnosis not present

## 2019-04-11 ENCOUNTER — Other Ambulatory Visit: Payer: Self-pay | Admitting: Cardiology

## 2019-04-11 DIAGNOSIS — R9389 Abnormal findings on diagnostic imaging of other specified body structures: Secondary | ICD-10-CM

## 2019-04-11 DIAGNOSIS — M1711 Unilateral primary osteoarthritis, right knee: Secondary | ICD-10-CM | POA: Diagnosis not present

## 2019-04-11 DIAGNOSIS — R0602 Shortness of breath: Secondary | ICD-10-CM

## 2019-04-11 DIAGNOSIS — M25561 Pain in right knee: Secondary | ICD-10-CM | POA: Diagnosis not present

## 2019-04-16 DIAGNOSIS — G4733 Obstructive sleep apnea (adult) (pediatric): Secondary | ICD-10-CM | POA: Diagnosis not present

## 2019-04-16 DIAGNOSIS — R0600 Dyspnea, unspecified: Secondary | ICD-10-CM | POA: Diagnosis not present

## 2019-04-17 ENCOUNTER — Other Ambulatory Visit: Payer: Self-pay | Admitting: Cardiology

## 2019-04-17 ENCOUNTER — Ambulatory Visit
Admission: RE | Admit: 2019-04-17 | Discharge: 2019-04-17 | Disposition: A | Discharge status: Home / Self Care | Payer: Medicare HMO | Source: Ambulatory Visit | Attending: Cardiology | Admitting: Cardiology

## 2019-04-17 ENCOUNTER — Other Ambulatory Visit: Payer: Self-pay

## 2019-04-17 DIAGNOSIS — R9389 Abnormal findings on diagnostic imaging of other specified body structures: Secondary | ICD-10-CM | POA: Insufficient documentation

## 2019-04-17 DIAGNOSIS — R0602 Shortness of breath: Secondary | ICD-10-CM | POA: Diagnosis not present

## 2019-04-17 DIAGNOSIS — R918 Other nonspecific abnormal finding of lung field: Secondary | ICD-10-CM | POA: Diagnosis not present

## 2019-04-17 DIAGNOSIS — R911 Solitary pulmonary nodule: Secondary | ICD-10-CM

## 2019-04-17 MED ORDER — IOHEXOL 300 MG/ML  SOLN
75.0000 mL | Freq: Once | INTRAMUSCULAR | Status: AC | PRN
  Administered 2019-04-17: 09:00:00 75 mL via INTRAVENOUS

## 2019-04-18 DIAGNOSIS — M6281 Muscle weakness (generalized): Secondary | ICD-10-CM | POA: Diagnosis not present

## 2019-04-18 DIAGNOSIS — G8929 Other chronic pain: Secondary | ICD-10-CM | POA: Diagnosis not present

## 2019-04-18 DIAGNOSIS — M25561 Pain in right knee: Secondary | ICD-10-CM | POA: Diagnosis not present

## 2019-04-18 DIAGNOSIS — M25661 Stiffness of right knee, not elsewhere classified: Secondary | ICD-10-CM | POA: Diagnosis not present

## 2019-04-23 DIAGNOSIS — M25561 Pain in right knee: Secondary | ICD-10-CM | POA: Diagnosis not present

## 2019-04-23 DIAGNOSIS — G8929 Other chronic pain: Secondary | ICD-10-CM | POA: Diagnosis not present

## 2019-04-25 ENCOUNTER — Encounter: Admission: RE | Admit: 2019-04-25 | Payer: Medicare HMO | Source: Ambulatory Visit

## 2019-04-26 DIAGNOSIS — G8929 Other chronic pain: Secondary | ICD-10-CM | POA: Diagnosis not present

## 2019-04-26 DIAGNOSIS — G4733 Obstructive sleep apnea (adult) (pediatric): Secondary | ICD-10-CM | POA: Diagnosis not present

## 2019-04-26 DIAGNOSIS — R918 Other nonspecific abnormal finding of lung field: Secondary | ICD-10-CM | POA: Diagnosis not present

## 2019-04-26 DIAGNOSIS — J449 Chronic obstructive pulmonary disease, unspecified: Secondary | ICD-10-CM | POA: Diagnosis not present

## 2019-04-26 DIAGNOSIS — R0600 Dyspnea, unspecified: Secondary | ICD-10-CM | POA: Diagnosis not present

## 2019-04-26 DIAGNOSIS — M25561 Pain in right knee: Secondary | ICD-10-CM | POA: Diagnosis not present

## 2019-04-30 DIAGNOSIS — F419 Anxiety disorder, unspecified: Secondary | ICD-10-CM | POA: Diagnosis not present

## 2019-04-30 DIAGNOSIS — Z6835 Body mass index (BMI) 35.0-35.9, adult: Secondary | ICD-10-CM | POA: Diagnosis not present

## 2019-05-07 ENCOUNTER — Other Ambulatory Visit: Payer: Self-pay | Admitting: Specialist

## 2019-05-07 DIAGNOSIS — R911 Solitary pulmonary nodule: Secondary | ICD-10-CM

## 2019-05-08 DIAGNOSIS — C3491 Malignant neoplasm of unspecified part of right bronchus or lung: Secondary | ICD-10-CM

## 2019-05-08 DIAGNOSIS — R918 Other nonspecific abnormal finding of lung field: Secondary | ICD-10-CM | POA: Diagnosis not present

## 2019-05-08 DIAGNOSIS — R05 Cough: Secondary | ICD-10-CM | POA: Diagnosis not present

## 2019-05-08 DIAGNOSIS — R0981 Nasal congestion: Secondary | ICD-10-CM | POA: Diagnosis not present

## 2019-05-08 HISTORY — DX: Malignant neoplasm of unspecified part of right bronchus or lung: C34.91

## 2019-05-09 ENCOUNTER — Ambulatory Visit
Admission: RE | Admit: 2019-05-09 | Discharge: 2019-05-09 | Disposition: A | Discharge status: Home / Self Care | Payer: Medicare HMO | Source: Ambulatory Visit | Attending: Cardiology | Admitting: Cardiology

## 2019-05-09 ENCOUNTER — Other Ambulatory Visit: Payer: Self-pay

## 2019-05-09 DIAGNOSIS — E041 Nontoxic single thyroid nodule: Secondary | ICD-10-CM | POA: Insufficient documentation

## 2019-05-09 DIAGNOSIS — K5730 Diverticulosis of large intestine without perforation or abscess without bleeding: Secondary | ICD-10-CM | POA: Diagnosis not present

## 2019-05-09 DIAGNOSIS — J328 Other chronic sinusitis: Secondary | ICD-10-CM | POA: Insufficient documentation

## 2019-05-09 DIAGNOSIS — I728 Aneurysm of other specified arteries: Secondary | ICD-10-CM | POA: Insufficient documentation

## 2019-05-09 DIAGNOSIS — R911 Solitary pulmonary nodule: Secondary | ICD-10-CM

## 2019-05-09 DIAGNOSIS — R918 Other nonspecific abnormal finding of lung field: Secondary | ICD-10-CM | POA: Insufficient documentation

## 2019-05-09 DIAGNOSIS — I700 Atherosclerosis of aorta: Secondary | ICD-10-CM | POA: Insufficient documentation

## 2019-05-09 LAB — GLUCOSE, CAPILLARY: Glucose-Capillary: 91 mg/dL (ref 70–99)

## 2019-05-09 MED ORDER — FLUDEOXYGLUCOSE F - 18 (FDG) INJECTION
8.9000 | Freq: Once | INTRAVENOUS | Status: AC | PRN
  Administered 2019-05-09: 10:00:00 8.89 via INTRAVENOUS

## 2019-05-14 DIAGNOSIS — R0600 Dyspnea, unspecified: Secondary | ICD-10-CM | POA: Diagnosis not present

## 2019-05-14 DIAGNOSIS — R918 Other nonspecific abnormal finding of lung field: Secondary | ICD-10-CM | POA: Diagnosis not present

## 2019-05-14 DIAGNOSIS — J449 Chronic obstructive pulmonary disease, unspecified: Secondary | ICD-10-CM | POA: Diagnosis not present

## 2019-05-15 DIAGNOSIS — G4733 Obstructive sleep apnea (adult) (pediatric): Secondary | ICD-10-CM | POA: Diagnosis not present

## 2019-06-04 ENCOUNTER — Inpatient Hospital Stay: Payer: Medicare HMO | Attending: Oncology | Admitting: Oncology

## 2019-06-04 ENCOUNTER — Encounter: Payer: Self-pay | Admitting: Oncology

## 2019-06-04 ENCOUNTER — Other Ambulatory Visit: Payer: Self-pay

## 2019-06-04 VITALS — BP 117/60 | HR 81 | Temp 98.1°F | Ht <= 58 in | Wt 175.0 lb

## 2019-06-04 DIAGNOSIS — R911 Solitary pulmonary nodule: Secondary | ICD-10-CM | POA: Diagnosis not present

## 2019-06-04 DIAGNOSIS — E039 Hypothyroidism, unspecified: Secondary | ICD-10-CM | POA: Insufficient documentation

## 2019-06-04 DIAGNOSIS — I10 Essential (primary) hypertension: Secondary | ICD-10-CM | POA: Diagnosis not present

## 2019-06-04 DIAGNOSIS — J449 Chronic obstructive pulmonary disease, unspecified: Secondary | ICD-10-CM | POA: Diagnosis not present

## 2019-06-04 DIAGNOSIS — R918 Other nonspecific abnormal finding of lung field: Secondary | ICD-10-CM | POA: Diagnosis not present

## 2019-06-04 DIAGNOSIS — R942 Abnormal results of pulmonary function studies: Secondary | ICD-10-CM | POA: Diagnosis not present

## 2019-06-04 DIAGNOSIS — R0600 Dyspnea, unspecified: Secondary | ICD-10-CM | POA: Diagnosis not present

## 2019-06-04 DIAGNOSIS — J439 Emphysema, unspecified: Secondary | ICD-10-CM | POA: Diagnosis not present

## 2019-06-04 NOTE — Progress Notes (Signed)
Patient is here today to establish care for a lung nodule. Patient stated that she gets SOB on exertion.

## 2019-06-05 ENCOUNTER — Ambulatory Visit (INDEPENDENT_AMBULATORY_CARE_PROVIDER_SITE_OTHER): Payer: Medicare HMO | Admitting: Cardiothoracic Surgery

## 2019-06-05 ENCOUNTER — Encounter: Payer: Self-pay | Admitting: Cardiothoracic Surgery

## 2019-06-05 ENCOUNTER — Other Ambulatory Visit: Payer: Self-pay

## 2019-06-05 VITALS — BP 147/81 | HR 81 | Temp 97.5°F | Resp 14 | Ht <= 58 in | Wt 175.8 lb

## 2019-06-05 DIAGNOSIS — R918 Other nonspecific abnormal finding of lung field: Secondary | ICD-10-CM | POA: Diagnosis not present

## 2019-06-05 NOTE — Patient Instructions (Addendum)
Please keep up with your CT and MRI appointments coming up.    Please call us if you have any questions or concerns.

## 2019-06-05 NOTE — Progress Notes (Signed)
Patient ID: Lindsey Thomas, female   DOB: [birth year], 78 y.o.   MRN: [id number]  Chief Complaint  Patient presents with  . New Patient (Initial Visit)    Lung Mass    Referred By Dr. Raul Del Reason for Referral right upper lobe mass  HPI Location, Quality, Duration, Severity, Timing, Context, Modifying Factors, Associated Signs and Symptoms.  Lindsey Thomas is a 78 y.o. female. Her problems began when she had a work-up for increasing shortness of breath about a month or so ago. During that time she had a chest x-ray made which revealed a possible right upper lobe lesion. She had a CT scan done which revealed a right upper lobe mass that was most consistent with a malignancy and a subsequent PET scan was performed. The PET scan was highly suggestive of carcinoma of the lung without evidence of distant disease. The patient then saw Dr. Janese Banks in oncology who is recommended a percutaneous biopsy as well as an MRI of the brain. Patient comes in today to discuss any role of surgery in the management of her presumed lung cancer. She states that she has been getting quite short of breath with minimal activities. She is able to walk approximately 30 feet to 40 feet before she gets short of breath. She is unable to walk up a flight of stairs because of her shortness of breath and also from her osteoarthritis. She has had no weight loss but has had a few pounds of weight gain. She has no CNS symptoms. She has had no hemoptysis.   Past Medical History:  Diagnosis Date  . Anxiety   . Arthritis   . Asthma   . Edema    feet  . Emphysema of lung ([place])   . GERD (gastroesophageal reflux disease)   . Headache    MIGRAINES  . History of orthopnea   . Hypercholesterolemia   . Hypertension   . Hypothyroidism    NODULES  . Lymphedema   . Migraine   . Shortness of breath dyspnea    WITH EXERTION  . Sleep apnea    MILD, DOES NOT USE CPAP  . Thyroid nodule     Past Surgical History:  Procedure  Laterality Date  . ABDOMINAL HYSTERECTOMY    . BREAST BIOPSY    . CARDIAC CATHETERIZATION    . CATARACT EXTRACTION W/PHACO Left 02/12/2016   Procedure: CATARACT EXTRACTION PHACO AND INTRAOCULAR LENS PLACEMENT (IOC);  Surgeon: Eulogio Bear, MD;  Location: [facility] ORS;  Service: Ophthalmology;  Laterality: Left;  [place] 01:30 AP% 15.2 CDE 13.71 Fluid pack lot # [id number]  . CATARACT EXTRACTION W/PHACO Right 03/18/2016   Procedure: CATARACT EXTRACTION PHACO AND INTRAOCULAR LENS PLACEMENT ([place]);  Surgeon: Eulogio Bear, MD;  Location: [facility] ORS;  Service: Ophthalmology;  Laterality: Right;  Lot # [id number] H [place]: 01:05.5 AP%:14.3 CDE: 9.33  . FRACTURE SURGERY Left    arm rod and screw  . KNEE ARTHROSCOPY    . OOPHORECTOMY    . RCR    . ROTATOR CUFF REPAIR Left 2006    Family History  Problem Relation Age of Onset  . Emphysema Mother   . Heart Problems Mother   . Tuberculosis Father   . Brain cancer Father   . Skin cancer Father   . Breast cancer Sister   . Irritable bowel syndrome Sister   . Lung cancer Brother   . Dementia Sister   . Schizophrenia Sister     Social History Social History  Tobacco Use  . Smoking status: Never Smoker  . Smokeless tobacco: Never Used  Substance Use Topics  . Alcohol use: No  . Drug use: Never    Allergies  Allergen Reactions  . Augmentin [Amoxicillin-Pot Clavulanate]   . Celecoxib   . Eryc [Erythromycin]   . Percocet [Oxycodone-Acetaminophen]     Current Outpatient Medications  Medication Sig Dispense Refill  . aspirin EC 81 MG tablet Take 81 mg by mouth daily.    . cetirizine (ZYRTEC) 10 MG tablet Take 10 mg by mouth at bedtime.    . Cholecalciferol (VITAMIN D PO) Take 5,000 Units by mouth daily.    . Cyanocobalamin (VITAMIN B-12 PO) Take 2 tablets by mouth daily.    . fluticasone (FLONASE) 50 MCG/ACT nasal spray Place 2 sprays into both nostrils daily.    Marland Kitchen loratadine (CLARITIN) 10 MG tablet Take 10 mg by mouth daily.    .  meloxicam (MOBIC) 7.5 MG tablet Take 1 tablet by mouth daily.    . montelukast (SINGULAIR) 10 MG tablet Take 10 mg by mouth at bedtime.     . Multiple Vitamins-Minerals (PRESERVISION AREDS PO) Take 1 capsule by mouth 2 (two) times daily.    Marland Kitchen omeprazole (PRILOSEC) 20 MG capsule Take 20 mg by mouth 2 (two) times daily before a meal.    . Turmeric 500 MG TABS Take 1 tablet by mouth 1 day or 1 dose.    . VENTOLIN HFA 108 (90 Base) MCG/ACT inhaler Inhale 2 puffs into the lungs as needed.    . furosemide (LASIX) 20 MG tablet Take by mouth.      No current facility-administered medications for this visit.      Review of Systems A complete review of systems was asked and was negative except for the following positive findings increasing shortness of breath  Blood pressure (!) 147/81, pulse 81, temperature (!) 97.5 F (36.4 C), temperature source Temporal, resp. rate 14, height 4\' 10"  (1.473 m), weight 175 lb 12.8 oz (79.7 kg), SpO2 93 %.  Physical Exam CONSTITUTIONAL:  Pleasant, well-developed, well-nourished, and in no acute distress. EYES: Pupils equal and reactive to light, Sclera non-icteric EARS, NOSE, MOUTH AND THROAT:  The oropharynx was clear.  Dentition is good repair.  Oral mucosa pink and moist. LYMPH NODES:  Lymph nodes in the neck and axillae were normal RESPIRATORY:  Lungs were clear.  Normal respiratory effort without pathologic use of accessory muscles of respiration CARDIOVASCULAR: Heart was regular without murmurs.  There were no carotid bruits. GI: The abdomen was soft, nontender, and nondistended. There were no palpable masses. There was no hepatosplenomegaly. There were normal bowel sounds in all quadrants. GU:  Rectal deferred.   MUSCULOSKELETAL:  Normal muscle strength and tone.  No clubbing or cyanosis.   SKIN:  There were no pathologic skin lesions.  There were no nodules on palpation. NEUROLOGIC:  Sensation is normal.  Cranial nerves are grossly intact. PSYCH:   Oriented to person, place and time.  Mood and affect are normal.  Data Reviewed CT scans  I have personally reviewed the patient's imaging, laboratory findings and medical records.    Assessment    Right upper lobe mass most consistent with malignancy    Plan    I had a long discussion with her and her daughter regarding the options. 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 I also discussed with her  the underlying etiology of her shortness of breath. Given the fact that she is quite limited in her activities I think that consultation with our radiation therapist is warranted. She is scheduled to meet with Dr. Donella Stade next week. She will also follow-up with Dr. Janese Banks once the CT-guided needle biopsy is performed. I did give her my business card as well as her daughter. I will be happy to see her once these evaluations have taken place and surgery is considered an option for her.        Nestor Lewandowsky, MD 06/05/2019, 11:47 AM

## 2019-06-05 NOTE — Progress Notes (Signed)
Hematology/Oncology Consult note [facility] Telephone:([phone] [phone]  Patient Care Team: Idelle Crouch, MD as PCP - General (Internal Medicine) Telford Nab, RN as Registered Nurse   Name of the patient: Lindsey Thomas  [id number]  12-Apr-1941    Reason for referral-right upper lobe spiculated lung mass   Referring physician-Dr. Raul Del  Date of visit: 06/05/19   History of presenting illness-patient is a 78 year old female with a past medical history significant for COPD hypertension hyperlipidemia and hypothyroidism among other medical problems.  She presented with symptoms of shortness of breath which prompted a CT scan. CT chest on 04/17/2019 showed a spiculated peripheral right upper lobe lung lesion measuring 18 x 13 mm concerning for primary lung cancer.  No evidence of mediastinal or hilar adenopathy.  This was followed by a PET CT scan which again showed uptake with an SUV of 3.1 in the area of the right upper lobe.  Smaller subcentimeter pulmonary nodules with no significant metabolic uptake.  No metabolic adenopathy.  Patient was seen by Dr. Raul Del for possible bronchoscopy.  Patient tells me that Dr. Raul Del also got in touch with radiology who thought that CT-guided lung biopsy did carry a risk of pneumothorax and lung collapse.  As of now patient has not had a biopsy.  She will be seeing Dr. Genevive Bi tomorrow and Dr. Donella Stade shortly as well.  Patient is otherwise independent of her ADLs and IADLs.  She does report chronic fatigue and exertional shortness of breath  ECOG PS- 2  Pain scale- 0   Review of systems- Review of Systems  Constitutional: Positive for malaise/fatigue. Negative for chills, fever and weight loss.  HENT: Negative for congestion, ear discharge and nosebleeds.   Eyes: Negative for blurred vision.  Respiratory: Positive for shortness of breath. Negative for cough, hemoptysis, sputum production and wheezing.     Cardiovascular: Negative for chest pain, palpitations, orthopnea and claudication.  Gastrointestinal: Negative for abdominal pain, blood in stool, constipation, diarrhea, heartburn, melena, nausea and vomiting.  Genitourinary: Negative for dysuria, flank pain, frequency, hematuria and urgency.  Musculoskeletal: Negative for back pain, joint pain and myalgias.  Skin: Negative for rash.  Neurological: Negative for dizziness, tingling, focal weakness, seizures, weakness and headaches.  Endo/Heme/Allergies: Does not bruise/bleed easily.  Psychiatric/Behavioral: Negative for depression and suicidal ideas. The patient does not have insomnia.     Allergies  Allergen Reactions  . Augmentin [Amoxicillin-Pot Clavulanate]   . Celecoxib   . Eryc [Erythromycin]   . Percocet [Oxycodone-Acetaminophen]     Patient Active Problem List   Diagnosis Date Noted  . Hypertension 04/26/2017  . 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 Leg swelling 04/23/2016  . Lymphedema 04/23/2016     Past Medical History:  Diagnosis Date  . Anxiety   . Arthritis   . Asthma   . Edema    feet  . Emphysema of lung ([place])   . GERD (gastroesophageal reflux disease)   . Headache    MIGRAINES  . History of orthopnea   . Hypercholesterolemia   . Hypertension   . Hypothyroidism    NODULES  . Lymphedema   . Migraine   . Shortness of breath dyspnea    WITH EXERTION  . Sleep apnea    MILD, DOES NOT USE CPAP  . Thyroid nodule      Past Surgical History:  Procedure Laterality Date  . ABDOMINAL HYSTERECTOMY    . BREAST BIOPSY    .  CARDIAC CATHETERIZATION    . CATARACT EXTRACTION W/PHACO Left 02/12/2016   Procedure: CATARACT EXTRACTION PHACO AND INTRAOCULAR LENS PLACEMENT (IOC);  Surgeon: Eulogio Bear, MD;  Location: [facility] ORS;  Service: Ophthalmology;  Laterality: Left;  [place] 01:30 AP% 15.2 CDE 13.71 Fluid pack lot # [id number]  . CATARACT EXTRACTION W/PHACO Right 03/18/2016   Procedure:  CATARACT EXTRACTION PHACO AND INTRAOCULAR LENS PLACEMENT ([place]);  Surgeon: Eulogio Bear, MD;  Location: [facility] ORS;  Service: Ophthalmology;  Laterality: Right;  Lot # [id number] H [place]: 01:05.5 AP%:14.3 CDE: 9.33  . FRACTURE SURGERY Left    arm rod and screw  . KNEE ARTHROSCOPY    . OOPHORECTOMY    . RCR    . ROTATOR CUFF REPAIR Left 2006    Social History   Socioeconomic History  . Marital status: Married    Spouse name: Not on file  . Number of children: Not on file  . Years of education: Not on file  . Highest education level: Not on file  Occupational History  . Not on file  Tobacco Use  . Smoking status: Never Smoker  . Smokeless tobacco: Never Used  Substance and Sexual Activity  . Alcohol use: No  . Drug use: Never  . Sexual activity: Not on file  Other Topics Concern  . Not on file  Social History Narrative  . Not on file   Social Determinants of Health   Financial Resource Strain:   . Difficulty of Paying Living Expenses: Not on file  Food Insecurity:   . Worried About Charity fundraiser in the Last Year: Not on file  . Ran Out of Food in the Last Year: Not on file  Transportation Needs:   . Lack of Transportation (Medical): Not on file  . Lack of Transportation (Non-Medical): Not on file  Physical Activity:   . Days of Exercise per Week: Not on file  . Minutes of Exercise per Session: Not on file  Stress:   . Feeling of Stress : Not on file  Social Connections:   . Frequency of Communication with Friends and Family: Not on file  . Frequency of Social Gatherings with Friends and Family: Not on file  . Attends Religious Services: Not on file  . Active Member of Clubs or Organizations: Not on file  . Attends Archivist Meetings: Not on file  . Marital Status: Not on file  Intimate Partner Violence:   . Fear of Current or Ex-Partner: Not on file  . Emotionally Abused: Not on file  . Physically Abused: Not on file  . Sexually Abused: Not on  file     Family History  Problem Relation Age of Onset  . Emphysema Mother   . Heart Problems Mother   . Tuberculosis Father   . Brain cancer Father   . Skin cancer Father   . Breast cancer Sister   . Irritable bowel syndrome Sister   . Lung cancer Brother   . Dementia Sister   . Schizophrenia Sister      Current Outpatient Medications:  .  aspirin EC 81 MG tablet, Take 81 mg by mouth daily., Disp: , Rfl:  .  cetirizine (ZYRTEC) 10 MG tablet, Take 10 mg by mouth at bedtime., Disp: , Rfl:  .  Cholecalciferol (VITAMIN D PO), Take 5,000 Units by mouth daily., Disp: , Rfl:  .  Cyanocobalamin (VITAMIN B-12 PO), Take 2 tablets by mouth daily., Disp: , Rfl:  .  fluticasone (FLONASE)  50 MCG/ACT nasal spray, Place 2 sprays into both nostrils daily., Disp: , Rfl:  .  furosemide (LASIX) 20 MG tablet, Take by mouth. , Disp: , Rfl:  .  loratadine (CLARITIN) 10 MG tablet, Take 10 mg by mouth daily., Disp: , Rfl:  .  meloxicam (MOBIC) 7.5 MG tablet, Take 1 tablet by mouth daily., Disp: , Rfl:  .  montelukast (SINGULAIR) 10 MG tablet, Take 10 mg by mouth at bedtime. , Disp: , Rfl:  .  Multiple Vitamins-Minerals (PRESERVISION AREDS PO), Take 1 capsule by mouth 2 (two) times daily., Disp: , Rfl:  .  omeprazole (PRILOSEC) 20 MG capsule, Take 20 mg by mouth 2 (two) times daily before a meal., Disp: , Rfl:  .  Turmeric 500 MG TABS, Take 1 tablet by mouth 1 day or 1 dose., Disp: , Rfl:  .  VENTOLIN HFA 108 (90 Base) MCG/ACT inhaler, Inhale 2 puffs into the lungs as needed., Disp: , Rfl:    Physical exam:  Vitals:   06/04/19 1100  BP: 117/60  Pulse: 81  Temp: 98.1 F (36.7 C)  TempSrc: Tympanic  SpO2: 97%  Weight: 175 lb (79.4 kg)  Height: 4\' 10"  (1.473 m)   Physical Exam Constitutional:      General: She is not in acute distress. HENT:     Head: Normocephalic and atraumatic.  Eyes:     Pupils: Pupils are equal, round, and reactive to light.  Cardiovascular:     Rate and Rhythm: Normal  rate and regular rhythm.     Heart sounds: Normal heart sounds.  Pulmonary:     Effort: Pulmonary effort is normal.     Breath sounds: Normal breath sounds.  Abdominal:     General: Bowel sounds are normal.     Palpations: Abdomen is soft.  Musculoskeletal:     Cervical back: Normal range of motion.  Skin:    General: Skin is warm and dry.  Neurological:     Mental Status: She is alert and oriented to person, place, and time.        CMP Latest Ref Rng & Units 05/08/2016  Glucose 65 - 99 mg/dL 144(H)  BUN 6 - 20 mg/dL 20  Creatinine 0.44 - 1.00 mg/dL 0.89  Sodium 135 - 145 mmol/L 134(L)  Potassium 3.5 - 5.1 mmol/L 4.0  Chloride 101 - 111 mmol/L 101  CO2 22 - 32 mmol/L 23  Calcium 8.9 - 10.3 mg/dL 9.0  Total Protein 6.4 - 8.2 g/dL -  Total Bilirubin 0.2 - 1.0 mg/dL -  Alkaline Phos 50 - 136 Unit/L -  AST 15 - 37 Unit/L -  ALT 12 - 78 U/L -   CBC Latest Ref Rng & Units 05/08/2016  WBC 3.6 - 11.0 K/uL 14.6(H)  Hemoglobin 12.0 - 16.0 g/dL 14.8  Hematocrit 35.0 - 47.0 % 43.6  Platelets 150 - 440 K/uL 233    No images are attached to the encounter.  NM PET Image Initial (PI) Skull Base To Thigh  Result Date: 05/09/2019 CLINICAL DATA:  Initial treatment strategy for right upper lobe lung nodule. EXAM: NUCLEAR MEDICINE PET SKULL BASE TO THIGH TECHNIQUE: 8.9 mCi F-18 FDG was injected intravenously. Full-ring PET imaging was performed from the skull base to thigh after the radiotracer. CT data was obtained and used for attenuation correction and anatomic localization. Fasting blood glucose: 91 mg/dl COMPARISON:  Multiple exams, including chest CT from 04/17/2019 and abdomen CT from 06/23/2017 FINDINGS: Mediastinal blood pool activity: SUV  max 0.7 Liver activity: SUV max 3.3 NECK: No significant abnormal hypermetabolic activity in this region. Incidental CT findings: Chronic bilateral ethmoid and frontal sinusitis. Hypodense left inferior thyroid nodule measuring 2.3 by 1.8 cm is not  hypermetabolic and is considered benign. CHEST: The irregular right upper lobe nodule measures 1.7 by 1.1 cm and has a maximum SUV of 3.1, most compatible with malignancy. None of the smaller scattered pulmonary nodules are appreciably hypermetabolic, although these nodules are below sensitive PET-CT size thresholds. No hypermetabolic adenopathy or other hypermetabolic findings in the chest. Incidental CT findings: Atherosclerotic calcification of the thoracic aorta. ABDOMEN/PELVIS: No significant abnormal hypermetabolic activity in this region. 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 Aortoiliac atherosclerotic vascular disease. Dependent density in the gallbladder potentially sludge or gallstones. Sigmoid colon diverticulosis. SKELETON: No significant abnormal hypermetabolic activity in this region. Incidental CT findings: Grade 1 anterolisthesis at L3-4. IMPRESSION: 1. The irregular right upper lobe pulmonary nodule has a maximum SUV of 3.1, most compatible with malignancy. None of the other scattered smaller pulmonary nodules are appreciably hypermetabolic, although these are below sensitive PET-CT size thresholds. No hypermetabolic adenopathy. 2. Other imaging findings of potential clinical significance: Chronic ethmoid and frontal sinusitis. Aortic Atherosclerosis (ICD10-I70.0). 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 Sigmoid colon diverticulosis. Benign-appearing left thyroid nodule. Electronically Signed   By: Van Clines M.D.   On: 05/09/2019 14:12    Assessment and plan- Patient is a 78 y.o. female with hypermetabolic 1.8 cm spiculated right upper lobe lung nodule concerning for lung cancer  At this time it remains to be seen if patient can get a biopsy for tissue diagnosis either through bronchoscopy or CT-guided biopsy.  I will get in touch with Dr.  Gust Brooms office regarding this.  Patient will also be seen by Dr. Genevive Bi tomorrow to see if she is a surgical candidate.  Given her age and performance status she will likely best benefit from SBRT.  Dr. Donella Stade will also be seeing the patient shortly to see if he would be comfortable offering empiric SBRT if biopsy cannot be done safely  From a medical oncology standpoint patient does not require any systemic chemotherapy for what appears to be a stage I lung cancer.  Options at this time are surgery versus SBRT.  I will obtain an MRI brain to complete her staging work-up.  I will tentatively see her back in 2 months time with a CT chest prior for a video visit   Thank you for this kind referral and the opportunity to participate in the care of this patient   Visit Diagnosis 1. Abnormal PET of right lung   2. Lung nodule     Dr. Randa Evens, MD, MPH [facility] at [facility] [phone] 06/05/2019 3:17 PM

## 2019-06-06 ENCOUNTER — Other Ambulatory Visit: Payer: Self-pay | Admitting: *Deleted

## 2019-06-06 DIAGNOSIS — R942 Abnormal results of pulmonary function studies: Secondary | ICD-10-CM

## 2019-06-06 DIAGNOSIS — R911 Solitary pulmonary nodule: Secondary | ICD-10-CM

## 2019-06-12 ENCOUNTER — Other Ambulatory Visit: Payer: Self-pay | Admitting: *Deleted

## 2019-06-12 DIAGNOSIS — R942 Abnormal results of pulmonary function studies: Secondary | ICD-10-CM

## 2019-06-12 DIAGNOSIS — R911 Solitary pulmonary nodule: Secondary | ICD-10-CM

## 2019-06-13 ENCOUNTER — Other Ambulatory Visit: Payer: Self-pay

## 2019-06-13 ENCOUNTER — Ambulatory Visit
Admission: RE | Admit: 2019-06-13 | Discharge: 2019-06-13 | Disposition: A | Discharge status: Home / Self Care | Payer: Medicare HMO | Source: Ambulatory Visit | Attending: Radiation Oncology | Admitting: Radiation Oncology

## 2019-06-13 ENCOUNTER — Encounter: Payer: Self-pay | Admitting: *Deleted

## 2019-06-13 ENCOUNTER — Encounter: Payer: Self-pay | Admitting: Radiation Oncology

## 2019-06-13 VITALS — BP 144/77 | HR 95 | Temp 97.0°F | Wt 175.7 lb

## 2019-06-13 DIAGNOSIS — J439 Emphysema, unspecified: Secondary | ICD-10-CM | POA: Insufficient documentation

## 2019-06-13 DIAGNOSIS — Z800 Family history of malignant neoplasm of digestive organs: Secondary | ICD-10-CM | POA: Diagnosis not present

## 2019-06-13 DIAGNOSIS — E7800 Pure hypercholesterolemia, unspecified: Secondary | ICD-10-CM | POA: Insufficient documentation

## 2019-06-13 DIAGNOSIS — R911 Solitary pulmonary nodule: Secondary | ICD-10-CM | POA: Insufficient documentation

## 2019-06-13 DIAGNOSIS — M129 Arthropathy, unspecified: Secondary | ICD-10-CM | POA: Diagnosis not present

## 2019-06-13 DIAGNOSIS — Z79899 Other long term (current) drug therapy: Secondary | ICD-10-CM | POA: Diagnosis not present

## 2019-06-13 DIAGNOSIS — I10 Essential (primary) hypertension: Secondary | ICD-10-CM | POA: Insufficient documentation

## 2019-06-13 DIAGNOSIS — Z7982 Long term (current) use of aspirin: Secondary | ICD-10-CM | POA: Diagnosis not present

## 2019-06-13 DIAGNOSIS — Z803 Family history of malignant neoplasm of breast: Secondary | ICD-10-CM | POA: Diagnosis not present

## 2019-06-13 DIAGNOSIS — J449 Chronic obstructive pulmonary disease, unspecified: Secondary | ICD-10-CM | POA: Insufficient documentation

## 2019-06-13 DIAGNOSIS — E039 Hypothyroidism, unspecified: Secondary | ICD-10-CM | POA: Diagnosis not present

## 2019-06-13 DIAGNOSIS — F419 Anxiety disorder, unspecified: Secondary | ICD-10-CM | POA: Insufficient documentation

## 2019-06-13 NOTE — Progress Notes (Signed)
  Oncology Nurse Navigator Documentation  Navigator Location: [facility]Med Onc (06/13/19 1400) Referral Date to RadOnc/MedOnc: 05/28/19 (06/13/19 1400) )Navigator Encounter Type: Initial RadOnc (06/13/19 1400)   Abnormal Finding Date: 04/17/19 (06/13/19 1400)                   Treatment Phase: Pre-Tx/Tx Discussion (06/13/19 1400) Barriers/Navigation Needs: No Barriers At This Time;No Needs;No Questions (06/13/19 1400)   Interventions: None Required (06/13/19 1400)            Acuity: Level 1-No Barriers (06/13/19 1400)    met with patient during initial consult with Dr. Baruch Gouty to discuss radiation treatments. Pt did not have any questions during visit. Introduced to navigator services. Contact info provided and instructed to call with any questions or needs. Pt verbalized understanding.     Time Spent with Patient: 30 (06/13/19 1400)

## 2019-06-13 NOTE — Consult Note (Signed)
NEW PATIENT EVALUATION  Name: Lindsey Thomas  MRN: [id number]  Date:   06/13/2019     DOB: May 10, 1941   This 79 y.o. 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  REFERRING PHYSICIAN: Idelle Crouch, MD  CHIEF COMPLAINT: No chief complaint on file.   DIAGNOSIS: The encounter diagnosis was Lung nodule.   PREVIOUS INVESTIGATIONS:  CT scan and PET CT scan reviewed Clinical notes reviewed  HPI: Patient is an 79 year old female who presented with increasing shortness of breath prompting a chest x-ray about a month ago showing a right upper lobe lesion.  CT scan confirmed a spiculated peripheral right upper lobe lesion measuring 1.8 cm worrisome for primary lung neoplasm.  No mediastinal or hilar adenopathy was seen.  She underwent a PET CT scan in early December again showing irregular right upper lobe pulmonary nodule a maximum SUV of 3.1 most compatible with malignancy.  She did have some other pulmonary nodules none of them appreciable hypermetabolic although they were below the sensitivity of PET/CT criteria.  No mediastinal hypermetabolic adenopathy was noted.  Patient has marked COPD.  She is short of breath with minimal exertion.  She specifically denies cough hemoptysis or chest tightness.  She has been seen by Dr. Faith Rogue and has been declined for surgical intervention.  She is now referred to radiation oncology for opinion.  PLANNED TREATMENT REGIMEN: SBRT  PAST MEDICAL HISTORY:  has a past medical history of Anxiety, Arthritis, Asthma, Edema, Emphysema of lung ([place]), GERD (gastroesophageal reflux disease), Headache, History of orthopnea, Hypercholesterolemia, Hypertension, Hypothyroidism, Lymphedema, Migraine, Shortness of breath dyspnea, Sleep apnea, and Thyroid nodule.    PAST SURGICAL HISTORY:  Past Surgical History:  Procedure Laterality Date  . ABDOMINAL HYSTERECTOMY    . BREAST BIOPSY     . CARDIAC CATHETERIZATION    . CATARACT EXTRACTION W/PHACO Left 02/12/2016   Procedure: CATARACT EXTRACTION PHACO AND INTRAOCULAR LENS PLACEMENT (IOC);  Surgeon: Eulogio Bear, MD;  Location: [facility] ORS;  Service: Ophthalmology;  Laterality: Left;  [place] 01:30 AP% 15.2 CDE 13.71 Fluid pack lot # [id number]  . CATARACT EXTRACTION W/PHACO Right 03/18/2016   Procedure: CATARACT EXTRACTION PHACO AND INTRAOCULAR LENS PLACEMENT ([place]);  Surgeon: Eulogio Bear, MD;  Location: [facility] ORS;  Service: Ophthalmology;  Laterality: Right;  Lot # [id number] H [place]: 01:05.5 AP%:14.3 CDE: 9.33  . FRACTURE SURGERY Left    arm rod and screw  . KNEE ARTHROSCOPY    . OOPHORECTOMY    . RCR    . ROTATOR CUFF REPAIR Left 2006    FAMILY HISTORY: family history includes Brain cancer in her father; Breast cancer in her sister; Dementia in her sister; Emphysema in her mother; Heart Problems in her mother; Irritable bowel syndrome in her sister; Lung cancer in her brother; Schizophrenia in her sister; Skin cancer in her father; Tuberculosis in her father.  SOCIAL HISTORY:  reports that she has never smoked. She has never used smokeless tobacco. She reports that she does not drink alcohol or use drugs.  ALLERGIES: Augmentin [amoxicillin-pot clavulanate], Celecoxib, Eryc [erythromycin], and Percocet [oxycodone-acetaminophen]  MEDICATIONS:  Current Outpatient Medications  Medication Sig Dispense Refill  . aspirin EC 81 MG tablet Take 81 mg by mouth daily.    . cetirizine (ZYRTEC) 10 MG tablet Take 10 mg by mouth at bedtime.    . Cholecalciferol (VITAMIN D PO) Take 5,000 Units by mouth daily.    Marland Kitchen  Cyanocobalamin (VITAMIN B-12 PO) Take 2 tablets by mouth daily.    . fluticasone (FLONASE) 50 MCG/ACT nasal spray Place 2 sprays into both nostrils daily.    . furosemide (LASIX) 20 MG tablet Take by mouth.     . loratadine (CLARITIN) 10 MG tablet Take 10 mg by mouth daily.    . meloxicam (MOBIC) 7.5 MG tablet Take 1 tablet by  mouth daily.    . montelukast (SINGULAIR) 10 MG tablet Take 10 mg by mouth at bedtime.     . Multiple Vitamins-Minerals (PRESERVISION AREDS PO) Take 1 capsule by mouth 2 (two) times daily.    Marland Kitchen omeprazole (PRILOSEC) 20 MG capsule Take 20 mg by mouth 2 (two) times daily before a meal.    . Turmeric 500 MG TABS Take 1 tablet by mouth 1 day or 1 dose.    . VENTOLIN HFA 108 (90 Base) MCG/ACT inhaler Inhale 2 puffs into the lungs as needed.     No current facility-administered medications for this encounter.    ECOG PERFORMANCE STATUS:  0 - Asymptomatic  REVIEW OF SYSTEMS: Except for the COPD emphysema and dyspnea on exertion Patient denies any weight loss, fatigue, weakness, fever, chills or night sweats. Patient denies any loss of vision, blurred vision. Patient denies any ringing  of the ears or hearing loss. No irregular heartbeat. Patient denies heart murmur or history of fainting. Patient denies any chest pain or pain radiating to her upper extremities. Patient denies any shortness of breath, difficulty breathing at night, cough or hemoptysis. Patient denies any swelling in the lower legs. Patient denies any nausea vomiting, vomiting of blood, or coffee ground material in the vomitus. Patient denies any stomach pain. Patient states has had normal bowel movements no significant constipation or diarrhea. Patient denies any dysuria, hematuria or significant nocturia. Patient denies any problems walking, swelling in the joints or loss of balance. Patient denies any skin changes, loss of hair or loss of weight. Patient denies any excessive worrying or anxiety or significant depression. Patient denies any problems with insomnia. Patient denies excessive thirst, polyuria, polydipsia. Patient denies any swollen glands, patient denies easy bruising or easy bleeding. Patient denies any recent infections, allergies or URI. Patient "s visual fields have not changed significantly in recent time.   PHYSICAL  EXAM: BP (!) 144/77   Pulse 95   Temp (!) 97 F (36.1 C)   Wt 175 lb 11.2 oz (79.7 kg)   BMI 36.72 kg/m  Well-developed slightly obese female in NAD.  Well-developed well-nourished patient in NAD. HEENT reveals PERLA, EOMI, discs not visualized.  Oral cavity is clear. No oral mucosal lesions are identified. Neck is clear without evidence of cervical or supraclavicular adenopathy. Lungs are clear to A&P. Cardiac examination is essentially unremarkable with regular rate and rhythm without murmur rub or thrill. Abdomen is benign with no organomegaly or masses noted. Motor sensory and DTR levels are equal and symmetric in the upper and lower extremities. Cranial nerves II through XII are grossly intact. Proprioception is intact. No peripheral adenopathy or edema is identified. No motor or sensory levels are noted. Crude visual fields are within normal range.  LABORATORY DATA: Labs are reviewed    RADIOLOGY RESULTS: CT scan and PET CT scan reviewed compatible with above-stated findings   IMPRESSION: Stage I non-small cell lung cancer of the right upper lobe in 79 year old female  PLAN: At this time I have recommended SBRT.  Based on the overwhelming evidence of spiculated right upper  lobe lesion which is PET positive feel this is no doubt a stage I non-small cell lung cancer.  I believe the risks in an 79 year old with severe significant COPD emphysema to biopsy at is not necessary.  I would plan on delivering 6000 cGy in 5 fractions using SBRT.  Would use 4-dimensional treatment planning as well as motion restriction for treatment planning.  Risks and benefits of treatment including possible increased cough fatigue loss of normal lung volume all were discussed in detail with the patient and her daughter.  They both seem to comprehend my treatment plan well.  I personally set up and ordered CT simulation for later this week.  I would like to take this opportunity to thank you for allowing me to  participate in the care of your patient.Noreene Filbert, MD

## 2019-06-14 ENCOUNTER — Encounter: Payer: Self-pay | Admitting: *Deleted

## 2019-06-14 ENCOUNTER — Ambulatory Visit
Admission: RE | Admit: 2019-06-14 | Discharge: 2019-06-14 | Disposition: A | Discharge status: Home / Self Care | Payer: Medicare HMO | Source: Ambulatory Visit | Attending: Radiation Oncology | Admitting: Radiation Oncology

## 2019-06-14 ENCOUNTER — Other Ambulatory Visit: Payer: Self-pay

## 2019-06-14 DIAGNOSIS — Z510 Encounter for antineoplastic radiation therapy: Secondary | ICD-10-CM | POA: Diagnosis not present

## 2019-06-14 DIAGNOSIS — R911 Solitary pulmonary nodule: Secondary | ICD-10-CM | POA: Diagnosis not present

## 2019-06-14 DIAGNOSIS — C3411 Malignant neoplasm of upper lobe, right bronchus or lung: Secondary | ICD-10-CM | POA: Diagnosis not present

## 2019-06-18 ENCOUNTER — Other Ambulatory Visit: Payer: Self-pay

## 2019-06-18 ENCOUNTER — Ambulatory Visit
Admission: RE | Admit: 2019-06-18 | Discharge: 2019-06-18 | Disposition: A | Discharge status: Home / Self Care | Payer: Medicare HMO | Source: Ambulatory Visit | Attending: Oncology | Admitting: Oncology

## 2019-06-18 DIAGNOSIS — R911 Solitary pulmonary nodule: Secondary | ICD-10-CM | POA: Diagnosis not present

## 2019-06-18 DIAGNOSIS — Z510 Encounter for antineoplastic radiation therapy: Secondary | ICD-10-CM | POA: Diagnosis not present

## 2019-06-18 DIAGNOSIS — C3411 Malignant neoplasm of upper lobe, right bronchus or lung: Secondary | ICD-10-CM | POA: Diagnosis not present

## 2019-06-18 DIAGNOSIS — C3490 Malignant neoplasm of unspecified part of unspecified bronchus or lung: Secondary | ICD-10-CM | POA: Diagnosis not present

## 2019-06-18 DIAGNOSIS — R942 Abnormal results of pulmonary function studies: Secondary | ICD-10-CM

## 2019-06-18 LAB — POCT I-STAT CREATININE: Creatinine, Ser: 0.7 mg/dL (ref 0.44–1.00)

## 2019-06-18 MED ORDER — GADOBUTROL 1 MMOL/ML IV SOLN
7.0000 mL | Freq: Once | INTRAVENOUS | Status: AC | PRN
  Administered 2019-06-18: 7 mL via INTRAVENOUS

## 2019-06-26 ENCOUNTER — Ambulatory Visit
Admission: RE | Admit: 2019-06-26 | Discharge: 2019-06-26 | Disposition: A | Discharge status: Home / Self Care | Payer: Medicare HMO | Source: Ambulatory Visit | Attending: Radiation Oncology | Admitting: Radiation Oncology

## 2019-06-26 ENCOUNTER — Other Ambulatory Visit: Payer: Self-pay

## 2019-06-26 DIAGNOSIS — Z510 Encounter for antineoplastic radiation therapy: Secondary | ICD-10-CM | POA: Diagnosis not present

## 2019-06-26 DIAGNOSIS — C3411 Malignant neoplasm of upper lobe, right bronchus or lung: Secondary | ICD-10-CM | POA: Diagnosis not present

## 2019-06-28 ENCOUNTER — Other Ambulatory Visit: Payer: Self-pay

## 2019-06-28 ENCOUNTER — Ambulatory Visit
Admission: RE | Admit: 2019-06-28 | Discharge: 2019-06-28 | Disposition: A | Discharge status: Home / Self Care | Payer: Medicare HMO | Source: Ambulatory Visit | Attending: Radiation Oncology | Admitting: Radiation Oncology

## 2019-06-28 DIAGNOSIS — Z510 Encounter for antineoplastic radiation therapy: Secondary | ICD-10-CM | POA: Diagnosis not present

## 2019-06-28 DIAGNOSIS — C3411 Malignant neoplasm of upper lobe, right bronchus or lung: Secondary | ICD-10-CM | POA: Diagnosis not present

## 2019-07-03 ENCOUNTER — Other Ambulatory Visit: Payer: Self-pay

## 2019-07-03 ENCOUNTER — Encounter: Payer: Self-pay | Admitting: *Deleted

## 2019-07-03 ENCOUNTER — Ambulatory Visit
Admission: RE | Admit: 2019-07-03 | Discharge: 2019-07-03 | Disposition: A | Discharge status: Home / Self Care | Payer: Medicare HMO | Source: Ambulatory Visit | Attending: Radiation Oncology | Admitting: Radiation Oncology

## 2019-07-03 DIAGNOSIS — Z510 Encounter for antineoplastic radiation therapy: Secondary | ICD-10-CM | POA: Diagnosis not present

## 2019-07-03 DIAGNOSIS — C3411 Malignant neoplasm of upper lobe, right bronchus or lung: Secondary | ICD-10-CM | POA: Diagnosis not present

## 2019-07-03 NOTE — Progress Notes (Signed)
  Oncology Nurse Navigator Documentation  Navigator Location: [facility] (07/03/19 0900)   )Navigator Encounter Type: Treatment (07/03/19 0900)                     Patient Visit Type: RadOnc (07/03/19 0900) Treatment Phase: Active Tx (07/03/19 0900) Barriers/Navigation Needs: No Barriers At This Time (07/03/19 0900)   Interventions: None Required (07/03/19 0900)                 met with patient after radiation treatment today. Pt did not have any questions or needs at this time. Instructed to call if has any future questions or needs. Nothing further needed.      Time Spent with Patient: 15 (07/03/19 0900)

## 2019-07-05 ENCOUNTER — Ambulatory Visit
Admission: RE | Admit: 2019-07-05 | Discharge: 2019-07-05 | Disposition: A | Discharge status: Home / Self Care | Payer: Medicare HMO | Source: Ambulatory Visit | Attending: Radiation Oncology | Admitting: Radiation Oncology

## 2019-07-05 ENCOUNTER — Other Ambulatory Visit: Payer: Self-pay

## 2019-07-05 DIAGNOSIS — Z510 Encounter for antineoplastic radiation therapy: Secondary | ICD-10-CM | POA: Diagnosis not present

## 2019-07-05 DIAGNOSIS — C3411 Malignant neoplasm of upper lobe, right bronchus or lung: Secondary | ICD-10-CM | POA: Diagnosis not present

## 2019-07-10 ENCOUNTER — Other Ambulatory Visit: Payer: Self-pay

## 2019-07-10 ENCOUNTER — Ambulatory Visit
Admission: RE | Admit: 2019-07-10 | Discharge: 2019-07-10 | Disposition: A | Discharge status: Home / Self Care | Payer: Medicare HMO | Source: Ambulatory Visit | Attending: Radiation Oncology | Admitting: Radiation Oncology

## 2019-07-10 ENCOUNTER — Encounter: Payer: Self-pay | Admitting: *Deleted

## 2019-07-10 DIAGNOSIS — Z510 Encounter for antineoplastic radiation therapy: Secondary | ICD-10-CM | POA: Diagnosis not present

## 2019-07-10 DIAGNOSIS — C3411 Malignant neoplasm of upper lobe, right bronchus or lung: Secondary | ICD-10-CM | POA: Diagnosis not present

## 2019-07-10 DIAGNOSIS — R911 Solitary pulmonary nodule: Secondary | ICD-10-CM | POA: Diagnosis not present

## 2019-07-24 ENCOUNTER — Other Ambulatory Visit: Payer: Self-pay

## 2019-07-25 ENCOUNTER — Ambulatory Visit: Admission: RE | Admit: 2019-07-25 | Payer: Medicare HMO | Source: Ambulatory Visit

## 2019-07-25 ENCOUNTER — Other Ambulatory Visit: Payer: Self-pay

## 2019-07-25 ENCOUNTER — Inpatient Hospital Stay: Payer: Medicare HMO | Attending: Oncology

## 2019-07-25 DIAGNOSIS — C3411 Malignant neoplasm of upper lobe, right bronchus or lung: Secondary | ICD-10-CM | POA: Diagnosis not present

## 2019-07-25 DIAGNOSIS — I10 Essential (primary) hypertension: Secondary | ICD-10-CM | POA: Insufficient documentation

## 2019-07-25 DIAGNOSIS — R911 Solitary pulmonary nodule: Secondary | ICD-10-CM

## 2019-07-25 DIAGNOSIS — Z79899 Other long term (current) drug therapy: Secondary | ICD-10-CM | POA: Diagnosis not present

## 2019-07-25 DIAGNOSIS — E039 Hypothyroidism, unspecified: Secondary | ICD-10-CM | POA: Insufficient documentation

## 2019-07-25 LAB — CBC WITH DIFFERENTIAL/PLATELET
Abs Immature Granulocytes: 0.01 10*3/uL (ref 0.00–0.07)
Basophils Absolute: 0 10*3/uL (ref 0.0–0.1)
Basophils Relative: 1 %
Eosinophils Absolute: 0.3 10*3/uL (ref 0.0–0.5)
Eosinophils Relative: 7 %
HCT: 40.2 % (ref 36.0–46.0)
Hemoglobin: 13.1 g/dL (ref 12.0–15.0)
Immature Granulocytes: 0 %
Lymphocytes Relative: 17 %
Lymphs Abs: 0.6 10*3/uL — ABNORMAL LOW (ref 0.7–4.0)
MCH: 29 pg (ref 26.0–34.0)
MCHC: 32.6 g/dL (ref 30.0–36.0)
MCV: 88.9 fL (ref 80.0–100.0)
Monocytes Absolute: 0.4 10*3/uL (ref 0.1–1.0)
Monocytes Relative: 12 %
Neutro Abs: 2.3 10*3/uL (ref 1.7–7.7)
Neutrophils Relative %: 63 %
Platelets: 186 10*3/uL (ref 150–400)
RBC: 4.52 MIL/uL (ref 3.87–5.11)
RDW: 13.4 % (ref 11.5–15.5)
WBC: 3.7 10*3/uL — ABNORMAL LOW (ref 4.0–10.5)
nRBC: 0 % (ref 0.0–0.2)

## 2019-07-25 LAB — COMPREHENSIVE METABOLIC PANEL
ALT: 14 U/L (ref 0–44)
AST: 19 U/L (ref 15–41)
Albumin: 3.8 g/dL (ref 3.5–5.0)
Alkaline Phosphatase: 110 U/L (ref 38–126)
Anion gap: 9 (ref 5–15)
BUN: 11 mg/dL (ref 8–23)
CO2: 27 mmol/L (ref 22–32)
Calcium: 8.9 mg/dL (ref 8.9–10.3)
Chloride: 98 mmol/L (ref 98–111)
Creatinine, Ser: 0.54 mg/dL (ref 0.44–1.00)
GFR calc Af Amer: >60 mL/min (ref >60)
GFR calc non Af Amer: >60 mL/min (ref >60)
Glucose, Bld: 102 mg/dL — ABNORMAL HIGH (ref 70–99)
Potassium: 4.3 mmol/L (ref 3.5–5.1)
Sodium: 134 mmol/L — ABNORMAL LOW (ref 135–145)
Total Bilirubin: 0.6 mg/dL (ref 0.3–1.2)
Total Protein: 6.6 g/dL (ref 6.5–8.1)

## 2019-07-26 ENCOUNTER — Encounter: Payer: Self-pay | Admitting: Oncology

## 2019-07-26 ENCOUNTER — Other Ambulatory Visit: Payer: Self-pay

## 2019-07-26 ENCOUNTER — Ambulatory Visit
Admission: RE | Admit: 2019-07-26 | Discharge: 2019-07-26 | Disposition: A | Discharge status: Home / Self Care | Payer: Medicare HMO | Source: Ambulatory Visit | Attending: Oncology | Admitting: Oncology

## 2019-07-26 DIAGNOSIS — R911 Solitary pulmonary nodule: Secondary | ICD-10-CM | POA: Insufficient documentation

## 2019-07-26 DIAGNOSIS — C3490 Malignant neoplasm of unspecified part of unspecified bronchus or lung: Secondary | ICD-10-CM | POA: Diagnosis not present

## 2019-07-26 DIAGNOSIS — R942 Abnormal results of pulmonary function studies: Secondary | ICD-10-CM | POA: Insufficient documentation

## 2019-07-26 MED ORDER — IOHEXOL 300 MG/ML  SOLN
75.0000 mL | Freq: Once | INTRAMUSCULAR | Status: AC | PRN
  Administered 2019-07-26: 14:00:00 75 mL via INTRAVENOUS

## 2019-07-26 NOTE — Progress Notes (Signed)
Patient would like her CT Scan results. Patient stated that she had been having pain on her right arm since December but will go and see somebody about it until she has "this" taken care of. Patient stated that she stays SOB but she knows that she will stay this way due to her COPD.

## 2019-07-27 ENCOUNTER — Inpatient Hospital Stay (HOSPITAL_BASED_OUTPATIENT_CLINIC_OR_DEPARTMENT_OTHER): Payer: Medicare HMO | Admitting: Oncology

## 2019-07-27 DIAGNOSIS — R911 Solitary pulmonary nodule: Secondary | ICD-10-CM | POA: Diagnosis not present

## 2019-07-27 DIAGNOSIS — Z08 Encounter for follow-up examination after completed treatment for malignant neoplasm: Secondary | ICD-10-CM | POA: Diagnosis not present

## 2019-07-27 DIAGNOSIS — Z85118 Personal history of other malignant neoplasm of bronchus and lung: Secondary | ICD-10-CM | POA: Diagnosis not present

## 2019-07-30 NOTE — Progress Notes (Signed)
I connected with Lindsey Thomas on 07/30/19 at 11:15 AM EST by video enabled telemedicine visit and verified that I am speaking with the correct person using two identifiers.   I discussed the limitations, risks, security and privacy concerns of performing an evaluation and management service by telemedicine and the availability of in-person appointments. I also discussed with the patient that there may be a patient responsible charge related to this service. The patient expressed understanding and agreed to proceed.  Other persons participating in the visit and their role in the encounter:  Patients daughter  Patient's location:  home Provider's location:  work  Risk analyst Complaint:  Discuss ct chest results and further management  History of present illness: patient is a 79 year old female with a past medical history significant for COPD hypertension hyperlipidemia and hypothyroidism among other medical problems.  She presented with symptoms of shortness of breath which prompted a CT scan. CT chest on 04/17/2019 showed a spiculated peripheral right upper lobe lung lesion measuring 18 x 13 mm concerning for primary lung cancer.  No evidence of mediastinal or hilar adenopathy.  This was followed by a PET CT scan which again showed uptake with an SUV of 3.1 in the area of the right upper lobe.  Smaller subcentimeter pulmonary nodules with no significant metabolic uptake.  No metabolic adenopathy.  Patient was seen by Dr. Raul Del for possible bronchoscopy.  Patient tells me that Dr. Raul Del also got in touch with radiology who thought that CT-guided lung biopsy did carry a risk of pneumothorax and lung collapse.  biopsy was not possible safely and she underwent SBRT to RUL without biopsy  Interval history: she feels well. Denies any complaints   Review of Systems  Constitutional: Negative for chills, fever, malaise/fatigue and weight loss.  HENT: Negative for congestion, ear discharge and nosebleeds.    Eyes: Negative for blurred vision.  Respiratory: Negative for cough, hemoptysis, sputum production, shortness of breath and wheezing.   Cardiovascular: Negative for chest pain, palpitations, orthopnea and claudication.  Gastrointestinal: Negative for abdominal pain, blood in stool, constipation, diarrhea, heartburn, melena, nausea and vomiting.  Genitourinary: Negative for dysuria, flank pain, frequency, hematuria and urgency.  Musculoskeletal: Negative for back pain, joint pain and myalgias.  Skin: Negative for rash.  Neurological: Negative for dizziness, tingling, focal weakness, seizures, weakness and headaches.  Endo/Heme/Allergies: Does not bruise/bleed easily.  Psychiatric/Behavioral: Negative for depression and suicidal ideas. The patient does not have insomnia.     Allergies  Allergen Reactions  . Augmentin [Amoxicillin-Pot Clavulanate]   . Celecoxib   . Eryc [Erythromycin]   . Percocet [Oxycodone-Acetaminophen]     Past Medical History:  Diagnosis Date  . Anxiety   . Arthritis   . Asthma   . Edema    feet  . Emphysema of lung ([place])   . GERD (gastroesophageal reflux disease)   . Headache    MIGRAINES  . History of orthopnea   . Hypercholesterolemia   . Hypertension   . Hypothyroidism    NODULES  . Lymphedema   . Migraine   . Shortness of breath dyspnea    WITH EXERTION  . Sleep apnea    MILD, DOES NOT USE CPAP  . Thyroid nodule     Past Surgical History:  Procedure Laterality Date  . ABDOMINAL HYSTERECTOMY    . BREAST BIOPSY    . CARDIAC CATHETERIZATION    . CATARACT EXTRACTION W/PHACO Left 02/12/2016   Procedure: CATARACT EXTRACTION PHACO AND INTRAOCULAR LENS PLACEMENT (IOC);  Surgeon: Eulogio Bear, MD;  Location: [facility] ORS;  Service: Ophthalmology;  Laterality: Left;  [place] 01:30 AP% 15.2 CDE 13.71 Fluid pack lot # [id number]  . CATARACT EXTRACTION W/PHACO Right 03/18/2016   Procedure: CATARACT EXTRACTION PHACO AND INTRAOCULAR LENS PLACEMENT ([place]);   Surgeon: Eulogio Bear, MD;  Location: [facility] ORS;  Service: Ophthalmology;  Laterality: Right;  Lot # [id number] H [place]: 01:05.5 AP%:14.3 CDE: 9.33  . FRACTURE SURGERY Left    arm rod and screw  . KNEE ARTHROSCOPY    . OOPHORECTOMY    . RCR    . ROTATOR CUFF REPAIR Left 2006    Social History   Socioeconomic History  . Marital status: Married    Spouse name: Not on file  . Number of children: Not on file  . Years of education: Not on file  . Highest education level: Not on file  Occupational History  . Not on file  Tobacco Use  . Smoking status: Never Smoker  . Smokeless tobacco: Never Used  Substance and Sexual Activity  . Alcohol use: No  . Drug use: Never  . Sexual activity: Not on file  Other Topics Concern  . Not on file  Social History Narrative  . Not on file   Social Determinants of Health   Financial Resource Strain:   . Difficulty of Paying Living Expenses: Not on file  Food Insecurity:   . Worried About Charity fundraiser in the Last Year: Not on file  . Ran Out of Food in the Last Year: Not on file  Transportation Needs:   . Lack of Transportation (Medical): Not on file  . Lack of Transportation (Non-Medical): Not on file  Physical Activity:   . Days of Exercise per Week: Not on file  . Minutes of Exercise per Session: Not on file  Stress:   . Feeling of Stress : Not on file  Social Connections:   . Frequency of Communication with Friends and Family: Not on file  . Frequency of Social Gatherings with Friends and Family: Not on file  . Attends Religious Services: Not on file  . Active Member of Clubs or Organizations: Not on file  . Attends Archivist Meetings: Not on file  . Marital Status: Not on file  Intimate Partner Violence:   . Fear of Current or Ex-Partner: Not on file  . Emotionally Abused: Not on file  . Physically Abused: Not on file  . Sexually Abused: Not on file    Family History  Problem Relation Age of Onset  .  Emphysema Mother   . Heart Problems Mother   . Tuberculosis Father   . Brain cancer Father   . Skin cancer Father   . Breast cancer Sister   . Irritable bowel syndrome Sister   . Lung cancer Brother   . Dementia Sister   . Schizophrenia Sister      Current Outpatient Medications:  .  aspirin EC 81 MG tablet, Take 81 mg by mouth daily., Disp: , Rfl:  .  cetirizine (ZYRTEC) 10 MG tablet, Take 10 mg by mouth at bedtime., Disp: , Rfl:  .  Cholecalciferol (VITAMIN D PO), Take 5,000 Units by mouth daily., Disp: , Rfl:  .  Cyanocobalamin (VITAMIN B-12 PO), Take 2 tablets by mouth daily., Disp: , Rfl:  .  fluticasone (FLONASE) 50 MCG/ACT nasal spray, Place 2 sprays into both nostrils daily., Disp: , Rfl:  .  furosemide (LASIX) 20 MG tablet, Take by  mouth. , Disp: , Rfl:  .  loratadine (CLARITIN) 10 MG tablet, Take 10 mg by mouth daily., Disp: , Rfl:  .  meloxicam (MOBIC) 7.5 MG tablet, Take 1 tablet by mouth daily., Disp: , Rfl:  .  montelukast (SINGULAIR) 10 MG tablet, Take 10 mg by mouth at bedtime. , Disp: , Rfl:  .  Multiple Vitamins-Minerals (PRESERVISION AREDS PO), Take 1 capsule by mouth 2 (two) times daily., Disp: , Rfl:  .  omeprazole (PRILOSEC) 20 MG capsule, Take 20 mg by mouth 2 (two) times daily before a meal., Disp: , Rfl:  .  Turmeric 500 MG TABS, Take 1 tablet by mouth 1 day or 1 dose., Disp: , Rfl:  .  VENTOLIN HFA 108 (90 Base) MCG/ACT inhaler, Inhale 2 puffs into the lungs as needed., Disp: , Rfl:   CT Chest W Contrast  Result Date: 07/26/2019 CLINICAL DATA:  Restaging lung cancer. EXAM: CT CHEST WITH CONTRAST TECHNIQUE: Multidetector CT imaging of the chest was performed during intravenous contrast administration. CONTRAST:  68mL OMNIPAQUE IOHEXOL 300 MG/ML  SOLN COMPARISON:  05/09/2019 FINDINGS: Cardiovascular: Normal heart size. No pericardial effusion. Aortic atherosclerosis. Mediastinum/Nodes: 2.3 cm left lobe of thyroid gland nodule noted, image 21/2. The trachea  appears patent and is midline. Normal appearance of the esophagus. No mediastinal or hilar adenopathy. No axillary or supraclavicular adenopathy. Lungs/Pleura: No pleural effusion, airspace consolidation or atelectasis. Treated lesion within the lateral right upper lobe measures 1.0 x 0.7 cm, image 60/3. Previously 1.8 x 1.3 cm. Additional small scattered lung nodules are identified bilaterally which appear unchanged. This includes a left upper lobe lung nodule measuring 3 mm, image 33/3. Upper Abdomen: No acute abnormality. Musculoskeletal: No chest wall abnormality. No acute or significant osseous findings. IMPRESSION: 1. Treated lesion within the lateral right upper lobe has decreased in size in the interval. 2. No new or progressive disease identified. 3. Left lobe of thyroid gland nodule. Recommend thyroid US (ref: J Am Coll Radiol. 2015 Feb;12(2): 143-50). Aortic Atherosclerosis (ICD10-I70.0). Electronically Signed   By: Kerby Moors M.D.   On: 07/26/2019 14:44    No images are attached to the encounter.   CMP Latest Ref Rng & Units 07/25/2019  Glucose 70 - 99 mg/dL 102(H)  BUN 8 - 23 mg/dL 11  Creatinine 0.44 - 1.00 mg/dL 0.54  Sodium 135 - 145 mmol/L 134(L)  Potassium 3.5 - 5.1 mmol/L 4.3  Chloride 98 - 111 mmol/L 98  CO2 22 - 32 mmol/L 27  Calcium 8.9 - 10.3 mg/dL 8.9  Total Protein 6.5 - 8.1 g/dL 6.6  Total Bilirubin 0.3 - 1.2 mg/dL 0.6  Alkaline Phos 38 - 126 U/L 110  AST 15 - 41 U/L 19  ALT 0 - 44 U/L 14   CBC Latest Ref Rng & Units 07/25/2019  WBC 4.0 - 10.5 K/uL 3.7(L)  Hemoglobin 12.0 - 15.0 g/dL 13.1  Hematocrit 36.0 - 46.0 % 40.2  Platelets 150 - 400 K/uL 186     Observation/objective:appears in no acute distress over video visit today. Breathing is non labored  Assessment and plan: Patient is a 79 year old female with RUL hypermetabolic nodule presumptively treated as stage I lung cancer s/p SBRT  I have reviewed CT chest images independently and discussed findings  with the patient. RUL lesion appears smaller after RT. No other new lesions seen. Continue active surveillance.    Follow-up instructions:ct chest with contrast in 6 months followed by video visit  I discussed the assessment  and treatment plan with the patient. The patient was provided an opportunity to ask questions and all were answered. The patient agreed with the plan and demonstrated an understanding of the instructions.   The patient was advised to call back or seek an in-person evaluation if the symptoms worsen or if the condition fails to improve as anticipated.    Visit Diagnosis: 1. Lung nodule   2. Encounter for follow-up surveillance of lung cancer     Dr. Randa Evens, MD, MPH [facility] at [facility] Tel- [phone] 07/30/2019 12:30 PM

## 2019-08-07 DIAGNOSIS — Z6835 Body mass index (BMI) 35.0-35.9, adult: Secondary | ICD-10-CM | POA: Diagnosis not present

## 2019-08-07 DIAGNOSIS — R0602 Shortness of breath: Secondary | ICD-10-CM | POA: Diagnosis not present

## 2019-08-07 DIAGNOSIS — J439 Emphysema, unspecified: Secondary | ICD-10-CM | POA: Diagnosis not present

## 2019-08-13 ENCOUNTER — Ambulatory Visit: Payer: Medicare HMO | Admitting: Radiation Oncology

## 2019-08-13 DIAGNOSIS — G4733 Obstructive sleep apnea (adult) (pediatric): Secondary | ICD-10-CM | POA: Diagnosis not present

## 2019-08-14 ENCOUNTER — Other Ambulatory Visit: Payer: Self-pay

## 2019-08-14 ENCOUNTER — Encounter: Payer: Self-pay | Admitting: Radiation Oncology

## 2019-08-15 ENCOUNTER — Ambulatory Visit
Admission: RE | Admit: 2019-08-15 | Discharge: 2019-08-15 | Disposition: A | Discharge status: Home / Self Care | Payer: Medicare HMO | Source: Ambulatory Visit | Attending: Radiation Oncology | Admitting: Radiation Oncology

## 2019-08-15 ENCOUNTER — Other Ambulatory Visit: Payer: Self-pay | Admitting: *Deleted

## 2019-08-15 VITALS — BP 124/78 | HR 81 | Temp 98.0°F | Resp 16 | Wt 179.1 lb

## 2019-08-15 DIAGNOSIS — H353132 Nonexudative age-related macular degeneration, bilateral, intermediate dry stage: Secondary | ICD-10-CM | POA: Diagnosis not present

## 2019-08-15 DIAGNOSIS — R911 Solitary pulmonary nodule: Secondary | ICD-10-CM

## 2019-08-15 NOTE — Progress Notes (Signed)
Radiation Oncology Follow up Note  Name: Lindsey Thomas   Date:   08/15/2019 MRN:  [id number] DOB: 03-10-1941    This 79 y.o. 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  REFERRING PROVIDER: Idelle Crouch, MD  HPI: Patient is an 79 year old female now about 1 month having completed SBRT to her right upper lobe for stage I non-small cell lung cancer.  Seen today in routine follow-up she is doing well she states she feels little more fatigued than she did before treatment.  She also states she is having some mild shortness of breath although on interview she states this is pretty stable as compared to prior to her treatments.  She specifically denies cough dysphagia..  COMPLICATIONS OF TREATMENT: none  FOLLOW UP COMPLIANCE: keeps appointments   PHYSICAL EXAM:  BP 124/78   Pulse 81   Temp 98 F (36.7 C)   Resp 16   Wt 179 lb 1.6 oz (81.2 kg)   SpO2 95%   BMI 37.43 kg/m  Well-developed well-nourished patient in NAD. HEENT reveals PERLA, EOMI, discs not visualized.  Oral cavity is clear. No oral mucosal lesions are identified. Neck is clear without evidence of cervical or supraclavicular adenopathy. Lungs are clear to A&P. Cardiac examination is essentially unremarkable with regular rate and rhythm without murmur rub or thrill. Abdomen is benign with no organomegaly or masses noted. Motor sensory and DTR levels are equal and symmetric in the upper and lower extremities. Cranial nerves II through XII are grossly intact. Proprioception is intact. No peripheral adenopathy or edema is identified. No motor or sensory levels are noted. Crude visual fields are within normal range.  RADIOLOGY RESULTS: No current films for review CT scan in 3 months ordered  PLAN: Present time patient is doing well with extremely low side effect profile from SBRT.  I have asked to see her back in 3 months with a CT  scan of the chest prior to that visit.  Patient continues follow-up care with Dr. Janese Banks.  Patient knows to call with any concerns.  I would like to take this opportunity to thank you for allowing me to participate in the care of your patient.Noreene Filbert, MD

## 2019-08-20 DIAGNOSIS — M7521 Bicipital tendinitis, right shoulder: Secondary | ICD-10-CM | POA: Diagnosis not present

## 2019-08-20 DIAGNOSIS — M25511 Pain in right shoulder: Secondary | ICD-10-CM | POA: Diagnosis not present

## 2019-08-20 DIAGNOSIS — M7541 Impingement syndrome of right shoulder: Secondary | ICD-10-CM | POA: Diagnosis not present

## 2019-08-20 DIAGNOSIS — M7581 Other shoulder lesions, right shoulder: Secondary | ICD-10-CM | POA: Diagnosis not present

## 2019-09-03 DIAGNOSIS — R918 Other nonspecific abnormal finding of lung field: Secondary | ICD-10-CM | POA: Diagnosis not present

## 2019-09-03 DIAGNOSIS — G4733 Obstructive sleep apnea (adult) (pediatric): Secondary | ICD-10-CM | POA: Diagnosis not present

## 2019-09-03 DIAGNOSIS — J439 Emphysema, unspecified: Secondary | ICD-10-CM | POA: Diagnosis not present

## 2019-09-03 DIAGNOSIS — R0600 Dyspnea, unspecified: Secondary | ICD-10-CM | POA: Diagnosis not present

## 2019-09-03 DIAGNOSIS — E663 Overweight: Secondary | ICD-10-CM | POA: Diagnosis not present

## 2019-09-21 DIAGNOSIS — Z79899 Other long term (current) drug therapy: Secondary | ICD-10-CM | POA: Diagnosis not present

## 2019-09-21 DIAGNOSIS — E041 Nontoxic single thyroid nodule: Secondary | ICD-10-CM | POA: Diagnosis not present

## 2019-09-21 DIAGNOSIS — E7800 Pure hypercholesterolemia, unspecified: Secondary | ICD-10-CM | POA: Diagnosis not present

## 2019-10-08 ENCOUNTER — Other Ambulatory Visit: Payer: Self-pay | Admitting: Internal Medicine

## 2019-10-08 DIAGNOSIS — E669 Obesity, unspecified: Secondary | ICD-10-CM | POA: Diagnosis not present

## 2019-10-08 DIAGNOSIS — R911 Solitary pulmonary nodule: Secondary | ICD-10-CM | POA: Diagnosis not present

## 2019-10-08 DIAGNOSIS — Z1231 Encounter for screening mammogram for malignant neoplasm of breast: Secondary | ICD-10-CM

## 2019-10-08 DIAGNOSIS — Z6838 Body mass index (BMI) 38.0-38.9, adult: Secondary | ICD-10-CM | POA: Diagnosis not present

## 2019-10-08 DIAGNOSIS — E041 Nontoxic single thyroid nodule: Secondary | ICD-10-CM | POA: Diagnosis not present

## 2019-10-08 DIAGNOSIS — Z1211 Encounter for screening for malignant neoplasm of colon: Secondary | ICD-10-CM | POA: Diagnosis not present

## 2019-10-08 DIAGNOSIS — Z0000 Encounter for general adult medical examination without abnormal findings: Secondary | ICD-10-CM | POA: Diagnosis not present

## 2019-10-08 DIAGNOSIS — I10 Essential (primary) hypertension: Secondary | ICD-10-CM | POA: Diagnosis not present

## 2019-10-12 DIAGNOSIS — E041 Nontoxic single thyroid nodule: Secondary | ICD-10-CM | POA: Diagnosis not present

## 2019-10-12 DIAGNOSIS — I10 Essential (primary) hypertension: Secondary | ICD-10-CM | POA: Diagnosis not present

## 2019-10-12 DIAGNOSIS — R911 Solitary pulmonary nodule: Secondary | ICD-10-CM | POA: Diagnosis not present

## 2019-10-12 DIAGNOSIS — Z6838 Body mass index (BMI) 38.0-38.9, adult: Secondary | ICD-10-CM | POA: Diagnosis not present

## 2019-10-12 DIAGNOSIS — E669 Obesity, unspecified: Secondary | ICD-10-CM | POA: Diagnosis not present

## 2019-10-12 DIAGNOSIS — Z1211 Encounter for screening for malignant neoplasm of colon: Secondary | ICD-10-CM | POA: Diagnosis not present

## 2019-10-12 DIAGNOSIS — Z0000 Encounter for general adult medical examination without abnormal findings: Secondary | ICD-10-CM | POA: Diagnosis not present

## 2019-10-24 DIAGNOSIS — M79674 Pain in right toe(s): Secondary | ICD-10-CM | POA: Diagnosis not present

## 2019-10-24 DIAGNOSIS — B351 Tinea unguium: Secondary | ICD-10-CM | POA: Diagnosis not present

## 2019-10-24 DIAGNOSIS — L600 Ingrowing nail: Secondary | ICD-10-CM | POA: Diagnosis not present

## 2019-10-24 DIAGNOSIS — M79675 Pain in left toe(s): Secondary | ICD-10-CM | POA: Diagnosis not present

## 2019-10-24 DIAGNOSIS — M2012 Hallux valgus (acquired), left foot: Secondary | ICD-10-CM | POA: Diagnosis not present

## 2019-10-30 DIAGNOSIS — E041 Nontoxic single thyroid nodule: Secondary | ICD-10-CM | POA: Diagnosis not present

## 2019-11-13 DIAGNOSIS — G4733 Obstructive sleep apnea (adult) (pediatric): Secondary | ICD-10-CM | POA: Diagnosis not present

## 2019-11-15 ENCOUNTER — Other Ambulatory Visit: Payer: Self-pay

## 2019-11-15 ENCOUNTER — Ambulatory Visit
Admission: RE | Admit: 2019-11-15 | Discharge: 2019-11-15 | Disposition: A | Discharge status: Home / Self Care | Payer: Medicare HMO | Source: Ambulatory Visit | Attending: Radiation Oncology | Admitting: Radiation Oncology

## 2019-11-15 DIAGNOSIS — R911 Solitary pulmonary nodule: Secondary | ICD-10-CM | POA: Diagnosis not present

## 2019-11-15 DIAGNOSIS — C3490 Malignant neoplasm of unspecified part of unspecified bronchus or lung: Secondary | ICD-10-CM | POA: Diagnosis not present

## 2019-11-15 LAB — POCT I-STAT CREATININE: Creatinine, Ser: 0.6 mg/dL (ref 0.44–1.00)

## 2019-11-15 MED ORDER — IOHEXOL 300 MG/ML  SOLN
75.0000 mL | Freq: Once | INTRAMUSCULAR | Status: AC | PRN
  Administered 2019-11-15: 75 mL via INTRAVENOUS

## 2019-11-19 ENCOUNTER — Encounter: Payer: Self-pay | Admitting: Radiation Oncology

## 2019-11-19 ENCOUNTER — Other Ambulatory Visit: Payer: Self-pay

## 2019-11-19 DIAGNOSIS — K5790 Diverticulosis of intestine, part unspecified, without perforation or abscess without bleeding: Secondary | ICD-10-CM | POA: Insufficient documentation

## 2019-11-21 ENCOUNTER — Other Ambulatory Visit: Payer: Self-pay

## 2019-11-21 ENCOUNTER — Ambulatory Visit
Admission: RE | Admit: 2019-11-21 | Discharge: 2019-11-21 | Disposition: A | Discharge status: Home / Self Care | Payer: Medicare HMO | Source: Ambulatory Visit | Attending: Radiation Oncology | Admitting: Radiation Oncology

## 2019-11-21 ENCOUNTER — Encounter: Payer: Self-pay | Admitting: Radiation Oncology

## 2019-11-21 VITALS — BP 130/72 | HR 70 | Temp 97.9°F | Resp 12 | Wt 176.2 lb

## 2019-11-21 DIAGNOSIS — C3411 Malignant neoplasm of upper lobe, right bronchus or lung: Secondary | ICD-10-CM | POA: Diagnosis not present

## 2019-11-21 DIAGNOSIS — R911 Solitary pulmonary nodule: Secondary | ICD-10-CM

## 2019-11-21 NOTE — Progress Notes (Signed)
Radiation Oncology Follow up Note  Name: Lindsey Thomas   Date:   11/21/2019 MRN:  [id number] DOB: 1940-06-17    This 79 y.o. 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  REFERRING PROVIDER: Idelle Crouch, MD  HPI: Patient is an 79 year old female now at 4 months having completed SBRT to her right upper lobe for stage I non-small cell lung cancer seen today in routine follow-up she is doing well she specifically denies cough hemoptysis chest tightness or fatigue.  She had a recent CT scan.  Showing bandlike crescentic groundglass nodularity along the peripheral right chest most likely secondary to radiation therapy.  She has bilateral small pulmonary nodules which have been stable over time.  COMPLICATIONS OF TREATMENT: none  FOLLOW UP COMPLIANCE: keeps appointments   PHYSICAL EXAM:  BP 130/72 (BP Location: Right Arm, Patient Position: Sitting, Cuff Size: Large)   Pulse 70   Temp 97.9 F (36.6 C) (Tympanic)   Resp 12   Wt 176 lb 3.2 oz (79.9 kg)   BMI 36.83 kg/m  Well-developed well-nourished patient in NAD. HEENT reveals PERLA, EOMI, discs not visualized.  Oral cavity is clear. No oral mucosal lesions are identified. Neck is clear without evidence of cervical or supraclavicular adenopathy. Lungs are clear to A&P. Cardiac examination is essentially unremarkable with regular rate and rhythm without murmur rub or thrill. Abdomen is benign with no organomegaly or masses noted. Motor sensory and DTR levels are equal and symmetric in the upper and lower extremities. Cranial nerves II through XII are grossly intact. Proprioception is intact. No peripheral adenopathy or edema is identified. No motor or sensory levels are noted. Crude visual fields are within normal range.  RADIOLOGY RESULTS: CT scan reviewed compatible with above-stated findings  PLAN: Present time patient is doing well with  apparent excellent result by CT criteria.  I am pleased with her overall progress.  I have asked to see her back in 6 months for follow-up with a CT scan prior to that visit.  Patient knows to call with any concerns.  She is concerned that her cancer was a small cell which I have cleared up for her since her brother is recently expired from progressive small cell lung cancer.  Patient knows to call with any concerns.  I would like to take this opportunity to thank you for allowing me to participate in the care of your patient.Noreene Filbert, MD

## 2019-12-06 ENCOUNTER — Telehealth: Payer: Self-pay

## 2019-12-06 DIAGNOSIS — R911 Solitary pulmonary nodule: Secondary | ICD-10-CM

## 2019-12-06 NOTE — Telephone Encounter (Signed)
Survivorship Care Plan visit completed.  Treatment summary reviewed and mailed to patient.  ASCO answers booklet reviewed and mailed to patient.  CARE program and Cancer Transitions discussed with patient along with other resources cancer center offers to patients and caregivers.  Patient verbalized understanding.  SCP packet mailed.  Patient in agreement for APP to have a Virtual visit to introduce them to the Survivorship Clinic.  Encouraged patient to call for any questions or concerns. 

## 2019-12-13 ENCOUNTER — Inpatient Hospital Stay: Payer: Medicare HMO | Admitting: Oncology

## 2019-12-13 ENCOUNTER — Inpatient Hospital Stay: Payer: Medicare HMO | Attending: Oncology | Admitting: Oncology

## 2019-12-13 ENCOUNTER — Other Ambulatory Visit: Payer: Self-pay

## 2019-12-13 DIAGNOSIS — C3490 Malignant neoplasm of unspecified part of unspecified bronchus or lung: Secondary | ICD-10-CM | POA: Diagnosis not present

## 2019-12-13 NOTE — Progress Notes (Addendum)
Virtual Visit Progress Note  Survivorship Clinic Consult note [facility]  Telephone:([phone] [phone]  Patient Care Team: Idelle Crouch, MD as PCP - General (Internal Medicine) Noreene Filbert, MD as Referring Physician (Radiation Oncology) Sindy Guadeloupe, MD as Consulting Physician (Oncology) Erby Pian, MD as Referring Physician (Specialist) Telford Nab, RN as Registered Nurse   Name of the patient: Lindsey Thomas  [id number]  1941/02/18   Date of visit: 12/31/19  CLINIC:  Survivorship  I connected with Laureen Abrahams on 12/31/19 at 10:30 AM EDT by telephone visit and verified that I am speaking with the correct person using two identifiers.   I discussed the limitations, risks, security and privacy concerns of performing an evaluation and management service by telemedicine and the availability of in-person appointments. I also discussed with the patient that there may be a patient responsible charge related to this service. The patient expressed understanding and agreed to proceed.   Other persons participating in the visit and their role in the encounter: Magdalene Patricia, RN (review Survivorship Care Plan) and daughter Cory Roughen  Patient's location: home Provider's location: clinic  Chief Complaint: Review Survivorship Care Plan and address acute survivorship needs  REASON FOR VISIT:  Survivorship Care Plan visit & to address acute survivorship needs   BRIEF ONCOLOGY HISTORY: Oncology History   No history exists.   INTERVAL HISTORY: Patient presents to the survivorship clinic today for initial meeting to review survivorship care plan detailing treatment course for lung cancer, as well as monitoring long-term side effects of that treatment, education regarding health maintenance, screening, and overall wellness and health promotion.  Overall, she reports feeling well since completing treatment with SBRT on February 2021.   Daughter notes breathing has become worse since radiation.  She believes her COPD is also worsened.  CT chest on 11/15/2019 showed radiation-like changes.  Repeat imaging in 6 months.  Reviewed past medical, surgical, social history were reviewed and updated as below.  We also updated patient's current medications and allergies.  ADDITIONAL REVIEW OF SYSTEMS:  Review of Systems  Constitutional: Positive for malaise/fatigue. Negative for chills, fever and weight loss.  HENT: Negative for congestion, ear pain and tinnitus.   Eyes: Negative.  Negative for blurred vision and double vision.  Respiratory: Positive for shortness of breath. Negative for cough and sputum production.   Cardiovascular: Negative.  Negative for chest pain, palpitations and leg swelling.  Gastrointestinal: Negative.  Negative for abdominal pain, constipation, diarrhea, nausea and vomiting.  Genitourinary: Negative for dysuria, frequency and urgency.  Musculoskeletal: Negative for back pain and falls.  Skin: Negative.  Negative for rash.  Neurological: Positive for weakness. Negative for headaches.  Endo/Heme/Allergies: Negative.  Does not bruise/bleed easily.  Psychiatric/Behavioral: Negative.  Negative for depression. The patient is not nervous/anxious and does not have insomnia.     PAST MEDICAL & SURGICAL HISTORY:  Past Medical History:  Diagnosis Date  . Anxiety   . Arthritis   . Asthma   . Edema    feet  . Emphysema of lung ([place])   . GERD (gastroesophageal reflux disease)   . Headache    MIGRAINES  . History of orthopnea   . Hypercholesterolemia   . Hypothyroidism    NODULES  . Lymphedema   . Migraine   . Non-small cell lung cancer, right ([place]) 05/2019   Rad tx's  . Shortness of breath dyspnea    WITH EXERTION  . Sleep apnea  MILD, DOES NOT USE CPAP  . Thyroid nodule    Past Surgical History:  Procedure Laterality Date  . ABDOMINAL HYSTERECTOMY    . BREAST BIOPSY    . CARDIAC CATHETERIZATION     . CATARACT EXTRACTION W/PHACO Left 02/12/2016   Procedure: CATARACT EXTRACTION PHACO AND INTRAOCULAR LENS PLACEMENT (IOC);  Surgeon: Eulogio Bear, MD;  Location: [facility] ORS;  Service: Ophthalmology;  Laterality: Left;  [place] 01:30 AP% 15.2 CDE 13.71 Fluid pack lot # [id number]  . CATARACT EXTRACTION W/PHACO Right 03/18/2016   Procedure: CATARACT EXTRACTION PHACO AND INTRAOCULAR LENS PLACEMENT ([place]);  Surgeon: Eulogio Bear, MD;  Location: [facility] ORS;  Service: Ophthalmology;  Laterality: Right;  Lot # [id number] H [place]: 01:05.5 AP%:14.3 CDE: 9.33  . FRACTURE SURGERY Left    arm rod and screw  . KNEE ARTHROSCOPY    . OOPHORECTOMY    . RCR    . ROTATOR CUFF REPAIR Left 2006    SOCIAL HISTORY: None  CURRENT MEDICATIONS:  Current Outpatient Medications on File Prior to Visit  Medication Sig Dispense Refill  . aspirin EC 81 MG tablet Take 81 mg by mouth daily.    . cetirizine (ZYRTEC) 10 MG tablet Take 10 mg by mouth at bedtime.    . Cholecalciferol (VITAMIN D PO) Take 5,000 Units by mouth daily.    . Cyanocobalamin (VITAMIN B-12 PO) Take 2 tablets by mouth daily.    . fluticasone (FLONASE) 50 MCG/ACT nasal spray Place 2 sprays into both nostrils daily.    . furosemide (LASIX) 20 MG tablet Take by mouth.     . loratadine (CLARITIN) 10 MG tablet Take 10 mg by mouth daily.    . meloxicam (MOBIC) 7.5 MG tablet Take 1 tablet by mouth daily.    . montelukast (SINGULAIR) 10 MG tablet Take 10 mg by mouth at bedtime.     . Multiple Vitamins-Minerals (PRESERVISION AREDS PO) Take 1 capsule by mouth 2 (two) times daily.    Marland Kitchen omeprazole (PRILOSEC) 20 MG capsule Take 20 mg by mouth 2 (two) times daily before a meal.    . TRELEGY ELLIPTA 100-62.5-25 MCG/INH AEPB Inhale 1 puff into the lungs daily.    . Turmeric 500 MG TABS Take 1 tablet by mouth 1 day or 1 dose.    . VENTOLIN HFA 108 (90 Base) MCG/ACT inhaler Inhale 2 puffs into the lungs as needed.     No current facility-administered medications on  file prior to visit.    ALLERGIES: Allergies  Allergen Reactions  . Augmentin [Amoxicillin-Pot Clavulanate]   . Celecoxib   . Eryc [Erythromycin]   . Percocet [Oxycodone-Acetaminophen]      PHYSICAL EXAM:  There were no vitals filed for this visit. There were no vitals filed for this visit.  Exam limited due to telemedicine  General: well appearing patient in in no acute distress.  Respiratory: Breathing non-labored.    Neuro: Alert and oriented Psych: Normal mood and affect for situation.  LABORATORY DATA:  CBC    Component Value Date/Time   WBC 3.7 (L) 07/25/2019 1020   RBC 4.52 07/25/2019 1020   HGB 13.1 07/25/2019 1020   HGB 15.9 10/16/2012 1600   HCT 40.2 07/25/2019 1020   HCT 46.1 10/16/2012 1600   PLT 186 07/25/2019 1020   PLT 210 10/16/2012 1600   MCV 88.9 07/25/2019 1020   MCV 89 10/16/2012 1600   MCH 29.0 07/25/2019 1020   MCHC 32.6 07/25/2019 1020   RDW 13.4 07/25/2019 1020  RDW 13.7 10/16/2012 1600   LYMPHSABS 0.6 (L) 07/25/2019 1020   MONOABS 0.4 07/25/2019 1020   EOSABS 0.3 07/25/2019 1020   BASOSABS 0.0 07/25/2019 1020      Chemistry      Component Value Date/Time   NA 134 (L) 07/25/2019 1020   NA 136 10/16/2012 1600   K 4.3 07/25/2019 1020   K 3.6 10/16/2012 1600   CL 98 07/25/2019 1020   CL 107 10/16/2012 1600   CO2 27 07/25/2019 1020   CO2 23 10/16/2012 1600   BUN 11 07/25/2019 1020   BUN 14 10/16/2012 1600   CREATININE 0.60 11/15/2019 0917   CREATININE 0.85 10/16/2012 1600      Component Value Date/Time   CALCIUM 8.9 07/25/2019 1020   CALCIUM 8.6 10/16/2012 1600   ALKPHOS 110 07/25/2019 1020   ALKPHOS 111 10/16/2012 1600   AST 19 07/25/2019 1020   AST 25 10/16/2012 1600   ALT 14 07/25/2019 1020   ALT 20 10/16/2012 1600   BILITOT 0.6 07/25/2019 1020   BILITOT 0.4 10/16/2012 1600      DIAGNOSTIC IMAGING:  CT chest from 11/15/2019-  IMPRESSION: 1. Bandlike crescentic ground-glass and nodularity along the peripheral  RIGHT chest obscures the area of nodularity seen on the previous exam there is no dense consolidation. Findings are likely related to interval radiotherapy. Correlate with any respiratory symptoms. 2. Numerous small bilateral pulmonary nodules. 3. 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 Recommend thyroid US (ref: J Am Coll Radiol. 2015 Feb;12(2): 143-50). 4. Aortic atherosclerosis.  ASSESSMENT & PLAN:  Ms. Graf is a pleasant 79 y.o. 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  Treatment was completed and February 2021.  She received 5 treatments.  She presents to survivorship clinic today for her care plan and to address any acute survivorship concerns after completing her treatment.  1.  Stage I lung cancer: Today, she received a copy of his [facility]) document, which was reviewed with him in detail.  The SCP details his cancer treatment history and potential late/long-term side effects of those treatments.  We discussed the follow-up schedule he can anticipate with interval imaging for surveillance of his cancer.  I have also shared a copy of his treatment summary/SCP with his PCP.  We discussed the NCCN guidelines for surveillance after completion of definitive therapy.  We discussed that these guidelines are followed once there is no evidence of clinical or radiographic disease. Stage I-II (primary treatment included RT) for stage III or stage IV (oligometastatic with all sites treated with definitive intent)-history and physical and chest CT +/- contrast every 3-6 months for 3 years then history and physical and chest CT +/-contrast every 6 months for 2 years then history and physical and low-dose noncontrast enhanced chest CT annually thereafter.  I advised that patients with residual or new radiographic abnormalities may require more frequent imaging  We discussed that if findings suggestive of recurrence PET/CT and/or brain  MRI with contrast may be indicated.  I discussed that PET/CT is generally not warranted for routine surveillance however, many benign conditions such as atelectasis, consolidation, and radiation fibrosis are difficult to differentiate from neoplasm on standard CT imaging and therefore PET CT may be needed to differentiate true malignancy in the settings.  I did advise that areas previously treated with radiation therapy can remain FDG avid for up to 2 years and if findings concerning for recurrent or progressive disease  histopathologic confirmation is recommended.  #. Problem at visit:   1.  Weakness and fatigue: Secondary to treatment and age.  Has underlying COPD.  Is interested in the care program.  Referral sent.  2.  Exertional shortness of breath: Secondary to radiation treatment.  Most recent imaging from June shows radiation like changes.  COPD appears stable.  3.  Lymphedema and lower extremity: Left leg-has lymphedema pump and on oral diuretic.  Followed by Dr. Lucky Cowboy.  #. Smoking cessation: I commended continued efforts to remain tobacco-free.  We discussed that one of the most important risk reduction strategies in preventing cancer recurrence in lung cancer patients is smoking cessation.  Patient is committed to abstaining from tobacco.    #. Physical activity/Healthy eating: Getting adequate physical activity and maintaining a healthy diet as a cancer survivor is important for overall wellness and reduces the risk of cancer recurrence. We discussed the CARE program, which is a fitness program that is offered to cancer survivors free of charge.  We also reviewed "The Nutrition Rainbow" handout, as well as the American Cancer Society's booklet with recommendations for nutrition and physical activity.    #. Health promotion/Cancer screening:  Ms. Harpham is reportedly up-to-date on her cancer screening tests and vaccinations. I encouraged her to talk with PCP about arranging appropriate cancer  screening tests, as appropriate.   #. Support services/Counseling: It is not uncommon for this period of the patient's cancer care trajectory to be one of many emotions and stressors.  Ms. Hargett was encouraged to take advantage of our many support services programs, support groups, and/or counseling in coping with her new life as a cancer survivor after completing anti-cancer treatment. We specifically discussed the Cancer Transitions program. He was given a calendar of the cancer center's support services events, as well as brochures for our spiritual care and free counseling resources.    Non-Small Cell Lung Cancer Follow-up Schedule and Recommendations    Symptom Review and Examination 0-3 years 3 years and beyond  Stage I-II (primary treatment includes surgery +/- chemotherapy) CT scan with oncologist follow-up every 6 months CT scan with oncologist follow-up annually  Symptom Review and Examination 0-3 years 3 years and beyond  Stage I-II (primary treatment included RT) or stage III (oligometastastic with all sites treated with definitive intent) CT scan with oncologist follow-up every 3-6 months CT scan with oncologist follow-up every -6 months   Stage IV  CT scan with oncologist follow up every 3 months  *Residual or new abnormalities seen on imaging may require more frequent imaging and follow up.    Radiology Tests  PET scan or Brain MRI is not routinely indicated in routine surveillance and follow-up of patients with Non-Small Cell Lung Cancer   Please continue to see your primary care provider for all your general health care needs, including cancer screening tests.  It is recommended that if you experience anything that represents a new or persistent symptom, that you have it evaluated by your primary care provider promptly.  If you are concerned about a new or persistent symptom being related to the cancer coming back, please contact us.      General Health & Cancer Screening  Recommendations   Physical with your PCP At least annually.  This visit should include labs as indicated by your history and lifestyle.    Colon Cancer Screening  Beginning at age 13; may be done sooner, if clinically indicated.  Generally, a colonoscopy is due every 10 years (  unless prior exam warrants more frequent testing).   Pap Smear Due every 3-5 years depending on type of testing done.  Discuss with your PCP or Gynecology Specialist.      Lung Cancer Screening Recommended in people who have a 30 pack year tobacco history, are between ages 31 and 63, and who smoke now, or have quit within the past 15 years.   Skin Cancer Screening and Prevention You should have your skin examined annually either by PCP or dermatology.  Use sunscreen of at least SPF 30 and reapply per label directions, avoid tanning beds, and avoid direct sunlight during peak hours.    Vaccinations (Flu, Shingles, Pneumonia, TDaP, etc.)  Discuss with your PCP     Diet & Exercise Eat plenty of fresh fruits & vegetables.  Exercise daily per your doctor's orders. Exercise can reduce the risk of some cancers.  Tobacco & Alcohol If you use tobacco products, you should stop. Let us know how we can help you!  Limit how much alcohol you drink, if you drink at all.            Resources, Interventions, and Education:   Survivors of cancer often report some of the following needs or concerns after they have completed treatment.  Below are some suggested interventions and resources to help guide you.  Just because your cancer treatment has ended, it does not mean that we stop helping you manage your needs or concerns.  Please let us know how we can best help you in your new life post-cancer and your return to health and wellness!           Disposition/Return to clinic  -Referral sent to the care program -RTC on 01/24/2020 for CT chest -RTC on 01/25/2020 for lab work and MD assessment. -Return to survivorship clinic as needed; no  additional follow-up needed at this time.  -Consider transitioning the patient to long-term survivorship, when clinically appropriate.    I discussed the assessment and treatment plan with the patient. The patient was provided an opportunity to ask questions and all were answered. The patient agreed with the plan and demonstrated an understanding of the instructions.   The patient was advised to call back or seek an in-person evaluation if the symptoms worsen or if the condition fails to improve as anticipated.   I provided 15 minutes of non face-to-face telephone visit time during this encounter, and > 50% was spent counseling as documented under my assessment & plan.  Rulon Abide, NP, AGNP-C [place] [facility] [phone] (clinic)  CC: Dr. Janese Banks

## 2019-12-31 NOTE — Addendum Note (Signed)
Addended by: [name] E on: 12/31/2019 12:53 PM   Modules accepted: Orders, Level of Service

## 2020-01-18 DIAGNOSIS — Z20822 Contact with and (suspected) exposure to covid-19: Secondary | ICD-10-CM | POA: Diagnosis not present

## 2020-01-22 DIAGNOSIS — L578 Other skin changes due to chronic exposure to nonionizing radiation: Secondary | ICD-10-CM | POA: Diagnosis not present

## 2020-01-22 DIAGNOSIS — Z86018 Personal history of other benign neoplasm: Secondary | ICD-10-CM | POA: Diagnosis not present

## 2020-01-22 DIAGNOSIS — Z872 Personal history of diseases of the skin and subcutaneous tissue: Secondary | ICD-10-CM | POA: Diagnosis not present

## 2020-01-24 ENCOUNTER — Ambulatory Visit
Admission: RE | Admit: 2020-01-24 | Discharge: 2020-01-24 | Disposition: A | Discharge status: Home / Self Care | Payer: Medicare HMO | Source: Ambulatory Visit | Attending: Oncology | Admitting: Oncology

## 2020-01-24 ENCOUNTER — Other Ambulatory Visit: Payer: Self-pay

## 2020-01-24 DIAGNOSIS — R911 Solitary pulmonary nodule: Secondary | ICD-10-CM | POA: Insufficient documentation

## 2020-01-24 DIAGNOSIS — C3490 Malignant neoplasm of unspecified part of unspecified bronchus or lung: Secondary | ICD-10-CM | POA: Diagnosis not present

## 2020-01-24 LAB — POCT I-STAT CREATININE: Creatinine, Ser: 0.6 mg/dL (ref 0.44–1.00)

## 2020-01-24 MED ORDER — IOHEXOL 300 MG/ML  SOLN
75.0000 mL | Freq: Once | INTRAMUSCULAR | Status: AC | PRN
  Administered 2020-01-24: 75 mL via INTRAVENOUS

## 2020-01-25 ENCOUNTER — Inpatient Hospital Stay: Payer: Medicare HMO | Attending: Oncology | Admitting: Oncology

## 2020-01-25 DIAGNOSIS — C3490 Malignant neoplasm of unspecified part of unspecified bronchus or lung: Secondary | ICD-10-CM | POA: Diagnosis not present

## 2020-01-25 DIAGNOSIS — Z85118 Personal history of other malignant neoplasm of bronchus and lung: Secondary | ICD-10-CM | POA: Diagnosis not present

## 2020-01-25 DIAGNOSIS — Z08 Encounter for follow-up examination after completed treatment for malignant neoplasm: Secondary | ICD-10-CM

## 2020-01-25 NOTE — Progress Notes (Signed)
Pt complains  Of shortness of breat and is concerned about her ct results.

## 2020-01-28 DIAGNOSIS — I288 Other diseases of pulmonary vessels: Secondary | ICD-10-CM | POA: Diagnosis not present

## 2020-01-28 DIAGNOSIS — G4733 Obstructive sleep apnea (adult) (pediatric): Secondary | ICD-10-CM | POA: Diagnosis not present

## 2020-01-28 DIAGNOSIS — R0600 Dyspnea, unspecified: Secondary | ICD-10-CM | POA: Diagnosis not present

## 2020-01-28 DIAGNOSIS — R911 Solitary pulmonary nodule: Secondary | ICD-10-CM | POA: Diagnosis not present

## 2020-01-28 DIAGNOSIS — I700 Atherosclerosis of aorta: Secondary | ICD-10-CM | POA: Diagnosis not present

## 2020-01-28 NOTE — Progress Notes (Signed)
I connected with Lindsey Thomas on 01/28/20 at  1:15 PM EDT by video enabled telemedicine visit and verified that I am speaking with the correct person using two identifiers.   I discussed the limitations, risks, security and privacy concerns of performing an evaluation and management service by telemedicine and the availability of in-person appointments. I also discussed with the patient that there may be a patient responsible charge related to this service. The patient expressed understanding and agreed to proceed.  Other persons participating in the visit and their role in the encounter:  none  Patient's location:  home Provider's location:  work  Diagnosis-history of lung cancer s/p SBRT  Chief Complaint:  Discuss CT scan results and further management  History of present illness: patient is a 79 year old female with a past medical history significant for COPD hypertension hyperlipidemia and hypothyroidism among other medical problems. She presented with symptoms of shortness of breath which prompted a CT scan.CT chest on 04/17/2019 showed a spiculated peripheral right upper lobe lung lesion measuring 18 x 13 mm concerning for primary lung cancer. No evidence of mediastinal or hilar adenopathy. This was followed by a PET CT scan which again showed uptake with an SUV of 3.1 in the area of the right upper lobe. Smaller subcentimeter pulmonary nodules with no significant metabolic uptake. No metabolic adenopathy. Patient was seen by Dr. Raul Del for possible bronchoscopy. Patient tells me that Dr. Raul Del also got in touch with radiology who thought that CT-guided lung biopsy did carry a risk of pneumothorax and lung collapse. biopsy was not possible safely and she underwent SBRT to RUL without biopsy  Interval history: Patient is doing well for her age.  Denies any worsening cough fever or shortness of breath.  She has received her Covid immunizations.   Review of Systems   Constitutional: Negative for chills, fever, malaise/fatigue and weight loss.  HENT: Negative for congestion, ear discharge and nosebleeds.   Eyes: Negative for blurred vision.  Respiratory: Negative for cough, hemoptysis, sputum production, shortness of breath and wheezing.   Cardiovascular: Negative for chest pain, palpitations, orthopnea and claudication.  Gastrointestinal: Negative for abdominal pain, blood in stool, constipation, diarrhea, heartburn, melena, nausea and vomiting.  Genitourinary: Negative for dysuria, flank pain, frequency, hematuria and urgency.  Musculoskeletal: Negative for back pain, joint pain and myalgias.  Skin: Negative for rash.  Neurological: Negative for dizziness, tingling, focal weakness, seizures, weakness and headaches.  Endo/Heme/Allergies: Does not bruise/bleed easily.  Psychiatric/Behavioral: Negative for depression and suicidal ideas. The patient does not have insomnia.     Allergies  Allergen Reactions  . Augmentin [Amoxicillin-Pot Clavulanate]   . Celecoxib   . Eryc [Erythromycin]   . Percocet [Oxycodone-Acetaminophen]     Past Medical History:  Diagnosis Date  . Anxiety   . Arthritis   . Asthma   . Edema    feet  . Emphysema of lung ([place])   . GERD (gastroesophageal reflux disease)   . Headache    MIGRAINES  . History of orthopnea   . Hypercholesterolemia   . Hypothyroidism    NODULES  . Lymphedema   . Migraine   . 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 Shortness of breath dyspnea    WITH EXERTION  . Sleep apnea    MILD, DOES NOT USE CPAP  . Thyroid nodule     Past Surgical History:  Procedure Laterality Date  . ABDOMINAL HYSTERECTOMY    . BREAST BIOPSY    .  CARDIAC CATHETERIZATION    . CATARACT EXTRACTION W/PHACO Left 02/12/2016   Procedure: CATARACT EXTRACTION PHACO AND INTRAOCULAR LENS PLACEMENT (IOC);  Surgeon: Eulogio Bear, MD;  Location: [facility] ORS;  Service: Ophthalmology;  Laterality: Left;   [place] 01:30 AP% 15.2 CDE 13.71 Fluid pack lot # [id number]  . CATARACT EXTRACTION W/PHACO Right 03/18/2016   Procedure: CATARACT EXTRACTION PHACO AND INTRAOCULAR LENS PLACEMENT ([place]);  Surgeon: Eulogio Bear, MD;  Location: [facility] ORS;  Service: Ophthalmology;  Laterality: Right;  Lot # [id number] H [place]: 01:05.5 AP%:14.3 CDE: 9.33  . FRACTURE SURGERY Left    arm rod and screw  . KNEE ARTHROSCOPY    . OOPHORECTOMY    . RCR    . ROTATOR CUFF REPAIR Left 2006    Social History   Socioeconomic History  . Marital status: Married    Spouse name: Not on file  . Number of children: Not on file  . Years of education: Not on file  . Highest education level: Not on file  Occupational History  . Not on file  Tobacco Use  . Smoking status: Never Smoker  . Smokeless tobacco: Never Used  Substance and Sexual Activity  . Alcohol use: No  . Drug use: Never  . Sexual activity: Not on file  Other Topics Concern  . Not on file  Social History Narrative  . Not on file   Social Determinants of Health   Financial Resource Strain:   . Difficulty of Paying Living Expenses: Not on file  Food Insecurity:   . Worried About Charity fundraiser in the Last Year: Not on file  . Ran Out of Food in the Last Year: Not on file  Transportation Needs:   . Lack of Transportation (Medical): Not on file  . Lack of Transportation (Non-Medical): Not on file  Physical Activity:   . Days of Exercise per Week: Not on file  . Minutes of Exercise per Session: Not on file  Stress:   . Feeling of Stress : Not on file  Social Connections:   . Frequency of Communication with Friends and Family: Not on file  . Frequency of Social Gatherings with Friends and Family: Not on file  . Attends Religious Services: Not on file  . Active Member of Clubs or Organizations: Not on file  . Attends Archivist Meetings: Not on file  . Marital Status: Not on file  Intimate Partner Violence:   . Fear of Current or  Ex-Partner: Not on file  . Emotionally Abused: Not on file  . Physically Abused: Not on file  . Sexually Abused: Not on file    Family History  Problem Relation Age of Onset  . Emphysema Mother   . Heart Problems Mother   . Tuberculosis Father   . Brain cancer Father   . Skin cancer Father   . Breast cancer Sister   . Irritable bowel syndrome Sister   . Lung cancer Brother   . Dementia Sister   . Schizophrenia Sister      Current Outpatient Medications:  .  aspirin EC 81 MG tablet, Take 81 mg by mouth daily., Disp: , Rfl:  .  Cholecalciferol (VITAMIN D PO), Take 5,000 Units by mouth daily., Disp: , Rfl:  .  Cyanocobalamin (VITAMIN B-12 PO), Take 2 tablets by mouth daily., Disp: , Rfl:  .  fluticasone (FLONASE) 50 MCG/ACT nasal spray, Place 2 sprays into both nostrils daily., Disp: , Rfl:  .  loratadine (CLARITIN)  10 MG tablet, Take 10 mg by mouth daily., Disp: , Rfl:  .  meloxicam (MOBIC) 7.5 MG tablet, Take 1 tablet by mouth daily., Disp: , Rfl:  .  montelukast (SINGULAIR) 10 MG tablet, Take 10 mg by mouth at bedtime. , Disp: , Rfl:  .  Multiple Vitamins-Minerals (PRESERVISION AREDS PO), Take 1 capsule by mouth 2 (two) times daily., Disp: , Rfl:  .  omeprazole (PRILOSEC) 20 MG capsule, Take 20 mg by mouth 2 (two) times daily before a meal., Disp: , Rfl:  .  TRELEGY ELLIPTA 100-62.5-25 MCG/INH AEPB, Inhale 1 puff into the lungs daily., Disp: , Rfl:  .  Turmeric 500 MG TABS, Take 1 tablet by mouth 1 day or 1 dose., Disp: , Rfl:  .  VENTOLIN HFA 108 (90 Base) MCG/ACT inhaler, Inhale 2 puffs into the lungs as needed., Disp: , Rfl:  .  cetirizine (ZYRTEC) 10 MG tablet, Take 10 mg by mouth at bedtime. (Patient not taking: Reported on 01/25/2020), Disp: , Rfl:  .  furosemide (LASIX) 20 MG tablet, Take by mouth. , Disp: , Rfl:   CT Chest W Contrast  Result Date: 01/24/2020 CLINICAL DATA:  Non-small cell lung cancer. Radiation therapy completed February 2021. Shortness of breath on  exertion. EXAM: CT CHEST WITH CONTRAST TECHNIQUE: Multidetector CT imaging of the chest was performed during intravenous contrast administration. CONTRAST:  38mL OMNIPAQUE IOHEXOL 300 MG/ML  SOLN COMPARISON:  11/15/2019. FINDINGS: Cardiovascular: Atherosclerotic calcification of the aorta. Right and left pulmonary arteries and heart are enlarged. No pericardial effusion. Mediastinum/Nodes: Low-attenuation left thyroid nodule measures 2.3 cm. No pathologically enlarged mediastinal, hilar or axillary lymph nodes. Esophagus is unremarkable. Lungs/Pleura: Increased patchy ground-glass and consolidation in the lateral right upper lobe. Scattered pulmonary nodules measure 4 mm or less in size, stable. Image quality is degraded by respiratory motion. No pleural fluid. Airway is unremarkable. Upper Abdomen: Visualized portions of the liver, gallbladder, adrenal glands, kidneys, spleen, pancreas stomach and bowel are grossly unremarkable. Musculoskeletal: Degenerative changes in the spine. No worrisome lytic or sclerotic lesions IMPRESSION: 1. Expected evolutionary changes of radiation therapy in the lateral right upper lobe. 2. Pulmonary nodules measure 4 mm or less in size, stable. 3. 2.3 cm left thyroid nodule. Recommend thyroid US (ref: J Am Coll Radiol. 2015 Feb;12(2): 143-50). 4.  Aortic atherosclerosis (ICD10-I70.0). 5. Enlarged pulmonary arteries, indicative pulmonary arterial hypertension. Electronically Signed   By: Lorin Picket M.D.   On: 01/24/2020 13:52    No images are attached to the encounter.   CMP Latest Ref Rng & Units 01/24/2020  Glucose 70 - 99 mg/dL -  BUN 8 - 23 mg/dL -  Creatinine 0.44 - 1.00 mg/dL 0.60  Sodium 135 - 145 mmol/L -  Potassium 3.5 - 5.1 mmol/L -  Chloride 98 - 111 mmol/L -  CO2 22 - 32 mmol/L -  Calcium 8.9 - 10.3 mg/dL -  Total Protein 6.5 - 8.1 g/dL -  Total Bilirubin 0.3 - 1.2 mg/dL -  Alkaline Phos 38 - 126 U/L -  AST 15 - 41 U/L -  ALT 0 - 44 U/L -   CBC  Latest Ref Rng & Units 07/25/2019  WBC 4.0 - 10.5 K/uL 3.7(L)  Hemoglobin 12.0 - 15.0 g/dL 13.1  Hematocrit 36 - 46 % 40.2  Platelets 150 - 400 K/uL 186     Observation/objective: Appears in no acute distress over video visit today.  Breathing is non labored  Assessment and plan: Patient is a 79 year old  female with right upper lobe hypermetabolic nodule presumptively treated of stage I lung cancer s/p SBRT and this is a routine follow-up visit  CT chest with contrast done on 01/24/2020 did not reveal any evidence of recurrence or progressive disease.  Expected changes of radiation treatment involving the right upper lobe.  Patient also noted to have a 2.3 cm left thyroid nodule but she will have this at least over the last 8 years and was also seen on CT chest back in 2013.  Continue to monitor  Follow-up instructions: I will see her back in 6 months with a CT chest prior  I discussed the assessment and treatment plan with the patient. The patient was provided an opportunity to ask questions and all were answered. The patient agreed with the plan and demonstrated an understanding of the instructions.   The patient was advised to call back or seek an in-person evaluation if the symptoms worsen or if the condition fails to improve as anticipated.   Visit Diagnosis: 1. Malignant neoplasm of lung, unspecified laterality, unspecified part of lung ([place])   2. Encounter for follow-up surveillance of lung cancer     Dr. Randa Evens, MD, MPH [facility] at [facility] Tel- [phone] 01/28/2020 8:14 AM

## 2020-01-31 ENCOUNTER — Ambulatory Visit
Admission: RE | Admit: 2020-01-31 | Discharge: 2020-01-31 | Disposition: A | Discharge status: Home / Self Care | Payer: Medicare HMO | Source: Ambulatory Visit | Attending: Internal Medicine | Admitting: Internal Medicine

## 2020-01-31 ENCOUNTER — Other Ambulatory Visit: Payer: Self-pay

## 2020-01-31 DIAGNOSIS — Z1231 Encounter for screening mammogram for malignant neoplasm of breast: Secondary | ICD-10-CM | POA: Diagnosis not present

## 2020-02-13 DIAGNOSIS — G4733 Obstructive sleep apnea (adult) (pediatric): Secondary | ICD-10-CM | POA: Diagnosis not present

## 2020-02-18 DIAGNOSIS — I878 Other specified disorders of veins: Secondary | ICD-10-CM | POA: Diagnosis not present

## 2020-02-18 DIAGNOSIS — R0689 Other abnormalities of breathing: Secondary | ICD-10-CM | POA: Diagnosis not present

## 2020-02-18 DIAGNOSIS — I890 Lymphedema, not elsewhere classified: Secondary | ICD-10-CM | POA: Diagnosis not present

## 2020-02-18 DIAGNOSIS — R0600 Dyspnea, unspecified: Secondary | ICD-10-CM | POA: Insufficient documentation

## 2020-02-18 DIAGNOSIS — F419 Anxiety disorder, unspecified: Secondary | ICD-10-CM | POA: Diagnosis not present

## 2020-02-18 DIAGNOSIS — E7800 Pure hypercholesterolemia, unspecified: Secondary | ICD-10-CM | POA: Diagnosis not present

## 2020-02-19 ENCOUNTER — Ambulatory Visit: Payer: Self-pay | Attending: Internal Medicine

## 2020-02-19 DIAGNOSIS — Z23 Encounter for immunization: Secondary | ICD-10-CM

## 2020-02-19 NOTE — Progress Notes (Signed)
   Covid-19 Vaccination Clinic  Name:  ANNALICIA RENFREW    MRN: [id number] DOB: Apr 25, 1941  02/19/2020  Ms. Flury was observed post Covid-19 immunization for 15 minutes without incident. She was provided with Vaccine Information Sheet and instruction to access the V-Safe system.   Ms. Goonan was instructed to call 911 with any severe reactions post vaccine: Marland Kitchen Difficulty breathing  . Swelling of face and throat  . A fast heartbeat  . A bad rash all over body  . Dizziness and weakness

## 2020-02-21 ENCOUNTER — Other Ambulatory Visit: Payer: Self-pay

## 2020-02-21 ENCOUNTER — Encounter: Payer: Medicare HMO | Attending: Specialist

## 2020-02-21 VITALS — Ht 59.0 in | Wt 176.0 lb

## 2020-02-21 DIAGNOSIS — H353132 Nonexudative age-related macular degeneration, bilateral, intermediate dry stage: Secondary | ICD-10-CM | POA: Diagnosis not present

## 2020-02-21 DIAGNOSIS — C3490 Malignant neoplasm of unspecified part of unspecified bronchus or lung: Secondary | ICD-10-CM

## 2020-02-21 NOTE — Progress Notes (Signed)
Daily Session Note  Patient Details  Name: Lindsey Thomas MRN: [id number] Date of Birth: July 23, 1940 Referring Provider:     Pulmonary Rehab from 02/21/2020 in [facility] Cardiac and Pulmonary Rehab  Referring Provider [name], NP      Encounter Date: 02/21/2020  Check In:  Session Check In - 02/21/20 1340      Check-In   Supervising physician immediately available to respond to emergencies See telemetry face sheet for immediately available ER MD    Location [facility]-Cardiac & Pulmonary Rehab    Staff Present Coralie Keens, MS Exercise Physiologist;Amanda Oletta Darter, BA, ACSM CEP, Exercise Physiologist    Virtual Visit No    Medication changes reported     No    Fall or balance concerns reported    No    Warm-up and Cool-down Not performed (comment)   6MWT and Gym Orientation   Resistance Training Performed Yes    VAD Patient? No    PAD/SET Patient? No      Pain Assessment   Currently in Pain? No/denies             Exercise Prescription Changes - 02/21/20 1300      Response to Exercise   Blood Pressure (Admit) 120/74    Blood Pressure (Exercise) 142/74    Blood Pressure (Exit) 128/76    Heart Rate (Admit) 82 bpm    Heart Rate (Exercise) 100 bpm    Heart Rate (Exit) 80 bpm    Oxygen Saturation (Admit) 95 %    Oxygen Saturation (Exercise) 89 %    Oxygen Saturation (Exit) 96 %    Rating of Perceived Exertion (Exercise) 13    Perceived Dyspnea (Exercise) 2    Symptoms SOB, resolved with rest    Comments walk test results    Duration Progress to 30 minutes of  aerobic without signs/symptoms of physical distress      Resistance Training   Weight 3 lb    Reps 10-15           6 Minute Walk    Row Name 02/21/20 1340         6 Minute Walk   Phase Initial     Distance 905 feet     Walk Time 5.2 minutes     # of Rest Breaks 1  3:28-4:16     MPH 1.97     METS 1.16     RPE 13     Perceived Dyspnea  2     VO2 Peak 4.07     Symptoms Yes (comment)     Comments SOB,  resolved with rest     Resting HR 82 bpm     Resting BP 120/74     Resting Oxygen Saturation  95 %     Exercise Oxygen Saturation  during 6 min walk 89 %     Max Ex. HR 100 bpm     Max Ex. BP 142/74     2 Minute Post BP 128/76             Social History   Tobacco Use  Smoking Status Never Smoker  Smokeless Tobacco Never Used    Goals Met:  Proper associated with RPD/PD & O2 Sat Exercise tolerated well Queuing for purse lip breathing No report of cardiac concerns or symptoms Strength training completed today  Goals Unmet:  Not Applicable  Comments: First full day of exercise!  Patient was oriented to gym and equipment including functions, settings, policies,  and procedures.  Patient's individual exercise prescription and treatment plan were reviewed.  All starting workloads were established based on the results of the 6 minute walk test done at initial orientation visit.  The plan for exercise progression was also introduced and progression will be customized based on patient's performance and goals.   Dr. Emily Filbert is Medical Director for [place] and [facility].

## 2020-02-26 ENCOUNTER — Other Ambulatory Visit: Payer: Self-pay

## 2020-02-26 ENCOUNTER — Encounter: Payer: Medicare HMO | Admitting: *Deleted

## 2020-02-26 DIAGNOSIS — C3490 Malignant neoplasm of unspecified part of unspecified bronchus or lung: Secondary | ICD-10-CM

## 2020-02-26 NOTE — Progress Notes (Signed)
Daily Session Note  Patient Details  Name: HELENA SARDO MRN: [id number] Date of Birth: 1940/10/01 Referring Provider:     Pulmonary Rehab from 02/21/2020 in [facility] Cardiac and Pulmonary Rehab  Referring Provider [name], NP      Encounter Date: 02/26/2020  Check In:  Session Check In - 02/26/20 1226      Check-In   Supervising physician immediately available to respond to emergencies See telemetry face sheet for immediately available ER MD    Location [facility]-Cardiac & Pulmonary Rehab    Staff Present Coralie Keens, MS Exercise Physiologist;Hunner Garcon Luan Pulling, MA, RCEP, CCRP, CCET    Virtual Visit No    Medication changes reported     No    Fall or balance concerns reported    No    Warm-up and Cool-down Performed on first and last piece of equipment    VAD Patient? No    PAD/SET Patient? No      Pain Assessment   Currently in Pain? No/denies              Social History   Tobacco Use  Smoking Status Never Smoker  Smokeless Tobacco Never Used    Goals Met:  Exercise tolerated well No report of cardiac concerns or symptoms Strength training completed today  Goals Unmet:  Not Applicable  Comments: Pt able to follow exercise prescription today without complaint.  Will continue to monitor for progression.    Dr. Emily Filbert is Medical Director for [place] and [facility].

## 2020-02-27 DIAGNOSIS — R0600 Dyspnea, unspecified: Secondary | ICD-10-CM | POA: Diagnosis not present

## 2020-02-27 DIAGNOSIS — R0689 Other abnormalities of breathing: Secondary | ICD-10-CM | POA: Diagnosis not present

## 2020-02-28 ENCOUNTER — Other Ambulatory Visit: Payer: Self-pay

## 2020-02-28 DIAGNOSIS — C3490 Malignant neoplasm of unspecified part of unspecified bronchus or lung: Secondary | ICD-10-CM

## 2020-02-28 NOTE — Progress Notes (Signed)
Daily Session Note  Patient Details  Name: Lindsey Thomas MRN: [id number] Date of Birth: [birth year] Referring Provider:     Pulmonary Rehab from 02/21/2020 in [facility] Cardiac and Pulmonary Rehab  Referring Provider [name], NP      Encounter Date: 02/28/2020  Check In:  Session Check In - 02/28/20 1239      Check-In   Supervising physician immediately available to respond to emergencies See telemetry face sheet for immediately available ER MD    Location [facility]-Cardiac & Pulmonary Rehab    Staff Present Coralie Keens, MS Exercise Physiologist;Amanda Oletta Darter, [place], ACSM CEP, Exercise Physiologist    Virtual Visit No    Medication changes reported     No    Fall or balance concerns reported    No    Warm-up and Cool-down Performed on first and last piece of equipment    Resistance Training Performed Yes    VAD Patient? No    PAD/SET Patient? No              Social History   Tobacco Use  Smoking Status Never Smoker  Smokeless Tobacco Never Used    Goals Met:  Independence with exercise equipment Exercise tolerated well No report of cardiac concerns or symptoms Strength training completed today  Goals Unmet:  Not Applicable  Comments: Pt able to follow exercise prescription today without complaint.  Will continue to monitor for progression.   Dr. Emily Filbert is Medical Director for [place] and [facility].

## 2020-03-04 ENCOUNTER — Other Ambulatory Visit: Payer: Self-pay

## 2020-03-04 DIAGNOSIS — C3490 Malignant neoplasm of unspecified part of unspecified bronchus or lung: Secondary | ICD-10-CM

## 2020-03-04 NOTE — Progress Notes (Signed)
Daily Session Note  Patient Details  Name: Lindsey Thomas MRN: [id number] Date of Birth: 05/30/1941 Referring Provider:     Pulmonary Rehab from 02/21/2020 in [facility] Cardiac and Pulmonary Rehab  Referring Provider [name], NP      Encounter Date: 03/04/2020  Check In:  Session Check In - 03/04/20 1341      Check-In   Supervising physician immediately available to respond to emergencies See telemetry face sheet for immediately available ER MD    Location [facility]-Cardiac & Pulmonary Rehab    Staff Present Coralie Keens, MS Exercise Physiologist;Joseph Flavia Shipper    Virtual Visit No    Medication changes reported     No    Fall or balance concerns reported    No    Warm-up and Cool-down Performed on first and last piece of equipment    Resistance Training Performed Yes    VAD Patient? No              Social History   Tobacco Use  Smoking Status Never Smoker  Smokeless Tobacco Never Used    Goals Met:  Independence with exercise equipment Exercise tolerated well No report of cardiac concerns or symptoms Strength training completed today  Goals Unmet:  Not Applicable  Comments: Pt able to follow exercise prescription today without complaint.  Will continue to monitor for progression.   Dr. Emily Filbert is Medical Director for [place] and [facility].

## 2020-03-07 DIAGNOSIS — Z20822 Contact with and (suspected) exposure to covid-19: Secondary | ICD-10-CM | POA: Diagnosis not present

## 2020-03-18 ENCOUNTER — Other Ambulatory Visit: Payer: Self-pay

## 2020-03-18 ENCOUNTER — Encounter: Payer: Medicare HMO | Attending: Oncology | Admitting: *Deleted

## 2020-03-18 DIAGNOSIS — C3490 Malignant neoplasm of unspecified part of unspecified bronchus or lung: Secondary | ICD-10-CM | POA: Insufficient documentation

## 2020-03-18 NOTE — Progress Notes (Signed)
Daily Session Note  Patient Details  Name: Lindsey Thomas MRN: [id number] Date of Birth: 12/26/1940 Referring Provider:     Pulmonary Rehab from 02/21/2020 in [facility] Cardiac and Pulmonary Rehab  Referring Provider [name], NP      Encounter Date: 03/18/2020  Check In:  Session Check In - 03/18/20 1227      Check-In   Supervising physician immediately available to respond to emergencies See telemetry face sheet for immediately available ER MD    Location [facility]-Cardiac & Pulmonary Rehab    Staff Present Alberteen Sam, MA, RCEP, CCRP, Marylynn Pearson, MS Exercise Physiologist    Virtual Visit No    Medication changes reported     No    Fall or balance concerns reported    No    Warm-up and Cool-down Performed on first and last piece of equipment    Resistance Training Performed Yes    VAD Patient? No    PAD/SET Patient? No      Pain Assessment   Currently in Pain? No/denies              Social History   Tobacco Use  Smoking Status Never Smoker  Smokeless Tobacco Never Used    Goals Met:  Independence with exercise equipment Exercise tolerated well No report of cardiac concerns or symptoms Strength training completed today  Goals Unmet:  Not Applicable  Comments: Pt able to follow exercise prescription today without complaint.  Will continue to monitor for progression.    Dr. Emily Filbert is Medical Director for [place] and [facility].

## 2020-03-20 ENCOUNTER — Other Ambulatory Visit: Payer: Self-pay

## 2020-03-20 DIAGNOSIS — C3490 Malignant neoplasm of unspecified part of unspecified bronchus or lung: Secondary | ICD-10-CM

## 2020-03-20 NOTE — Progress Notes (Signed)
Daily Session Note  Patient Details  Name: Lindsey Thomas MRN: [id number] Date of Birth: 1940-11-17 Referring Provider:     Pulmonary Rehab from 02/21/2020 in [facility] Cardiac and Pulmonary Rehab  Referring Provider [name], NP      Encounter Date: 03/20/2020  Check In:  Session Check In - 03/20/20 1234      Check-In   Supervising physician immediately available to respond to emergencies See telemetry face sheet for immediately available ER MD    Location [facility]-Cardiac & Pulmonary Rehab    Staff Present Coralie Keens, MS Exercise Physiologist;Nayib Remer Oletta Darter, [place], ACSM CEP, Exercise Physiologist    Virtual Visit No    Medication changes reported     No    Fall or balance concerns reported    No    Warm-up and Cool-down Performed on first and last piece of equipment    Resistance Training Performed Yes    VAD Patient? No    PAD/SET Patient? No      Pain Assessment   Currently in Pain? No/denies    Multiple Pain Sites No              Social History   Tobacco Use  Smoking Status Never Smoker  Smokeless Tobacco Never Used    Goals Met:  Independence with exercise equipment Exercise tolerated well Strength training completed today  Goals Unmet:  Not Applicable  Comments: Pt able to follow exercise prescription today without complaint.  Will continue to monitor for progression.   Dr. Emily Filbert is Medical Director for [place] and [facility].

## 2020-03-25 ENCOUNTER — Encounter: Payer: Medicare HMO | Admitting: *Deleted

## 2020-03-25 ENCOUNTER — Other Ambulatory Visit: Payer: Self-pay

## 2020-03-25 DIAGNOSIS — C3490 Malignant neoplasm of unspecified part of unspecified bronchus or lung: Secondary | ICD-10-CM

## 2020-03-25 NOTE — Progress Notes (Signed)
Daily Session Note  Patient Details  Name: Lindsey Thomas MRN: [id number] Date of Birth: Apr 13, 1941 Referring Provider:     Pulmonary Rehab from 02/21/2020 in [facility] Cardiac and Pulmonary Rehab  Referring Provider [name], NP      Encounter Date: 03/25/2020  Check In:  Session Check In - 03/25/20 1242      Check-In   Supervising physician immediately available to respond to emergencies See telemetry face sheet for immediately available ER MD    Location [facility]-Cardiac & Pulmonary Rehab    Staff Present Alberteen Sam, MA, RCEP, CCRP, Marylynn Pearson, MS Exercise Physiologist    Virtual Visit No    Medication changes reported     No    Fall or balance concerns reported    No    Warm-up and Cool-down Performed on first and last piece of equipment    Resistance Training Performed Yes    VAD Patient? No    PAD/SET Patient? No      Pain Assessment   Currently in Pain? No/denies              Social History   Tobacco Use  Smoking Status Never Smoker  Smokeless Tobacco Never Used    Goals Met:  Independence with exercise equipment Exercise tolerated well No report of cardiac concerns or symptoms Strength training completed today  Goals Unmet:  Not Applicable  Comments: Pt able to follow exercise prescription today without complaint.  Will continue to monitor for progression.    Dr. Emily Filbert is Medical Director for [name] and [facility].

## 2020-03-27 ENCOUNTER — Other Ambulatory Visit: Payer: Self-pay

## 2020-03-27 DIAGNOSIS — C3490 Malignant neoplasm of unspecified part of unspecified bronchus or lung: Secondary | ICD-10-CM

## 2020-03-27 NOTE — Progress Notes (Signed)
Daily Session Note  Patient Details  Name: Lindsey Thomas MRN: [id number] Date of Birth: 30-Oct-1940 Referring Provider:     Pulmonary Rehab from 02/21/2020 in [facility] Cardiac and Pulmonary Rehab  Referring Provider [name], NP      Encounter Date: 03/27/2020  Check In:  Session Check In - 03/27/20 1239      Check-In   Supervising physician immediately available to respond to emergencies See telemetry face sheet for immediately available ER MD    Location [facility]-Cardiac & Pulmonary Rehab    Staff Present Coralie Keens, MS Exercise Physiologist;Amanda Oletta Darter, [place], ACSM CEP, Exercise Physiologist    Virtual Visit No    Medication changes reported     No    Fall or balance concerns reported    No    Warm-up and Cool-down Performed on first and last piece of equipment    Resistance Training Performed Yes    VAD Patient? No              Social History   Tobacco Use  Smoking Status Never Smoker  Smokeless Tobacco Never Used    Goals Met:  Independence with exercise equipment Exercise tolerated well No report of cardiac concerns or symptoms Strength training completed today  Goals Unmet:  Not Applicable  Comments: Pt able to follow exercise prescription today without complaint.  Will continue to monitor for progression.   Dr. Emily Filbert is Medical Director for [place] and [facility].

## 2020-04-02 DIAGNOSIS — Z79899 Other long term (current) drug therapy: Secondary | ICD-10-CM | POA: Diagnosis not present

## 2020-04-02 DIAGNOSIS — E7800 Pure hypercholesterolemia, unspecified: Secondary | ICD-10-CM | POA: Diagnosis not present

## 2020-04-02 DIAGNOSIS — E041 Nontoxic single thyroid nodule: Secondary | ICD-10-CM | POA: Diagnosis not present

## 2020-04-08 ENCOUNTER — Other Ambulatory Visit: Payer: Self-pay

## 2020-04-08 ENCOUNTER — Encounter: Payer: Medicare HMO | Attending: Oncology

## 2020-04-08 DIAGNOSIS — C3490 Malignant neoplasm of unspecified part of unspecified bronchus or lung: Secondary | ICD-10-CM | POA: Insufficient documentation

## 2020-04-08 NOTE — Progress Notes (Signed)
Daily Session Note  Patient Details  Name: Lindsey Thomas MRN: [id number] Date of Birth: Sep 11, 1940 Referring Provider:     Pulmonary Rehab from 02/21/2020 in [facility] Cardiac and Pulmonary Rehab  Referring Provider [name], NP      Encounter Date: 04/08/2020  Check In:  Session Check In - 04/08/20 1300      Check-In   Supervising physician immediately available to respond to emergencies See telemetry face sheet for immediately available ER MD    Location [facility]-Cardiac & Pulmonary Rehab    Staff Present Coralie Keens, MS Exercise Physiologist;Billey Wojciak Oletta Darter, [place], ACSM CEP, Exercise Physiologist    Virtual Visit No    Medication changes reported     No    Fall or balance concerns reported    No    Warm-up and Cool-down Performed on first and last piece of equipment    Resistance Training Performed Yes    VAD Patient? No    PAD/SET Patient? No      Pain Assessment   Currently in Pain? No/denies    Multiple Pain Sites No              Social History   Tobacco Use  Smoking Status Never Smoker  Smokeless Tobacco Never Used    Goals Met:  Independence with exercise equipment Exercise tolerated well No report of cardiac concerns or symptoms Strength training completed today  Goals Unmet:  Not Applicable  Comments: Pt able to follow exercise prescription today without complaint.  Will continue to monitor for progression.    Dr. Emily Filbert is Medical Director for [place] and [facility].

## 2020-04-09 ENCOUNTER — Other Ambulatory Visit: Payer: Self-pay | Admitting: Internal Medicine

## 2020-04-09 DIAGNOSIS — Z13820 Encounter for screening for osteoporosis: Secondary | ICD-10-CM | POA: Diagnosis not present

## 2020-04-09 DIAGNOSIS — I890 Lymphedema, not elsewhere classified: Secondary | ICD-10-CM | POA: Diagnosis not present

## 2020-04-09 DIAGNOSIS — Z23 Encounter for immunization: Secondary | ICD-10-CM | POA: Diagnosis not present

## 2020-04-09 DIAGNOSIS — R55 Syncope and collapse: Secondary | ICD-10-CM

## 2020-04-09 DIAGNOSIS — E7800 Pure hypercholesterolemia, unspecified: Secondary | ICD-10-CM | POA: Diagnosis not present

## 2020-04-15 ENCOUNTER — Other Ambulatory Visit: Payer: Self-pay

## 2020-04-15 ENCOUNTER — Ambulatory Visit (INDEPENDENT_AMBULATORY_CARE_PROVIDER_SITE_OTHER): Payer: Medicare HMO | Admitting: Nurse Practitioner

## 2020-04-15 ENCOUNTER — Encounter (INDEPENDENT_AMBULATORY_CARE_PROVIDER_SITE_OTHER): Payer: Self-pay | Admitting: Nurse Practitioner

## 2020-04-15 ENCOUNTER — Encounter: Payer: Medicare HMO | Admitting: *Deleted

## 2020-04-15 VITALS — BP 141/78 | HR 71 | Ht <= 58 in | Wt 176.0 lb

## 2020-04-15 DIAGNOSIS — I890 Lymphedema, not elsewhere classified: Secondary | ICD-10-CM | POA: Diagnosis not present

## 2020-04-15 DIAGNOSIS — I10 Essential (primary) hypertension: Secondary | ICD-10-CM

## 2020-04-15 DIAGNOSIS — E7800 Pure hypercholesterolemia, unspecified: Secondary | ICD-10-CM

## 2020-04-15 DIAGNOSIS — M79604 Pain in right leg: Secondary | ICD-10-CM

## 2020-04-15 DIAGNOSIS — C3490 Malignant neoplasm of unspecified part of unspecified bronchus or lung: Secondary | ICD-10-CM

## 2020-04-15 DIAGNOSIS — M79605 Pain in left leg: Secondary | ICD-10-CM

## 2020-04-15 NOTE — Progress Notes (Signed)
Daily Session Note  Patient Details  Name: Lindsey Thomas MRN: [id number] Date of Birth: [birth year] Referring Provider:     Pulmonary Rehab from 02/21/2020 in [facility] Cardiac and Pulmonary Rehab  Referring Provider [name], NP      Encounter Date: 04/15/2020  Check In:  Session Check In - 04/15/20 1233      Check-In   Supervising physician immediately available to respond to emergencies See telemetry face sheet for immediately available ER MD    Location [facility]-Cardiac & Pulmonary Rehab    Staff Present Alberteen Sam, MA, RCEP, CCRP, Marylynn Pearson, MS Exercise Physiologist    Virtual Visit No    Medication changes reported     No    Fall or balance concerns reported    No    Warm-up and Cool-down Performed on first and last piece of equipment    Resistance Training Performed Yes    VAD Patient? No    PAD/SET Patient? No      Pain Assessment   Currently in Pain? No/denies              Social History   Tobacco Use  Smoking Status Never Smoker  Smokeless Tobacco Never Used    Goals Met:  Proper associated with RPD/PD & O2 Sat Independence with exercise equipment Exercise tolerated well No report of cardiac concerns or symptoms Strength training completed today  Goals Unmet:  Not Applicable  Comments: Pt able to follow exercise prescription today without complaint.  Will continue to monitor for progression.    Dr. Emily Filbert is Medical Director for [place] and [facility].

## 2020-04-17 ENCOUNTER — Other Ambulatory Visit: Payer: Self-pay

## 2020-04-17 DIAGNOSIS — C3490 Malignant neoplasm of unspecified part of unspecified bronchus or lung: Secondary | ICD-10-CM

## 2020-04-17 DIAGNOSIS — M1711 Unilateral primary osteoarthritis, right knee: Secondary | ICD-10-CM | POA: Diagnosis not present

## 2020-04-17 DIAGNOSIS — M810 Age-related osteoporosis without current pathological fracture: Secondary | ICD-10-CM | POA: Diagnosis not present

## 2020-04-17 NOTE — Progress Notes (Signed)
Daily Session Note  Patient Details  Name: Lindsey Thomas MRN: [id number] Date of Birth: [birth year] Referring Provider:     Pulmonary Rehab from 02/21/2020 in [facility] Cardiac and Pulmonary Rehab  Referring Provider [name], NP      Encounter Date: 04/17/2020  Check In:  Session Check In - 04/17/20 1225      Check-In   Supervising physician immediately available to respond to emergencies See telemetry face sheet for immediately available ER MD    Location [facility]-Cardiac & Pulmonary Rehab    Staff Present Nada Maclachlan, BA, ACSM CEP, Exercise Physiologist;Stepfon Rawles Eliezer Bottom, MS Exercise Physiologist    Virtual Visit No    Medication changes reported     No    Fall or balance concerns reported    No    Warm-up and Cool-down Performed on first and last piece of equipment    Resistance Training Performed Yes    VAD Patient? No    PAD/SET Patient? No              Social History   Tobacco Use  Smoking Status Never Smoker  Smokeless Tobacco Never Used    Goals Met:  Independence with exercise equipment Exercise tolerated well Queuing for purse lip breathing No report of cardiac concerns or symptoms Strength training completed today  Goals Unmet:  Not Applicable  Comments: Pt able to follow exercise prescription today without complaint.  Will continue to monitor for progression.   Dr. Emily Filbert is Medical Director for [place] and [facility].

## 2020-04-20 ENCOUNTER — Encounter (INDEPENDENT_AMBULATORY_CARE_PROVIDER_SITE_OTHER): Payer: Self-pay | Admitting: Nurse Practitioner

## 2020-04-20 NOTE — Progress Notes (Signed)
Subjective:    Patient ID: Lindsey Thomas, female    DOB: 26-Apr-1941, 79 y.o.   MRN: [id number] Chief Complaint  Patient presents with  . Follow-up    [place]. lymphedema     The patient presents today for follow-up in regards to her lymphedema.  Previously the patient had better control of her lower extremity edema.  She recently visited her primary care physician who noticed a worsening of her lower extremity edema.  The patient notes that her legs feel heavy within the last 2 to 3 weeks.  They feel like they want to get out.  The patient notes that a few weeks ago she had a fall and she landed on her right side.  She was at a very awkward angle and she stayed on her right side for approximately 15 to 20 minutes until help arrived and was able to get her to a upright position.  The patient also does have some back pain with lifting.  The patient also notes that she stands for long periods and has not been as consistent with lymphedema pump usage.  Patient has also had a recent lung cancer diagnosis as well.  She denies any fever, chills, nausea, vomiting or diarrhea.  She denies chest pain or shortness of breath.   Review of Systems  Cardiovascular: Positive for leg swelling.  Musculoskeletal: Positive for gait problem.  Neurological: Positive for weakness.  All other systems reviewed and are negative.      Objective:   Physical Exam Vitals reviewed.  HENT:     Head: Normocephalic.  Cardiovascular:     Rate and Rhythm: Normal rate and regular rhythm.     Pulses: Decreased pulses.  Pulmonary:     Effort: Pulmonary effort is normal.  Musculoskeletal:     Right lower leg: Edema present.     Left lower leg: Edema present.  Neurological:     Mental Status: She is alert and oriented to person, place, and time.  Psychiatric:        Mood and Affect: Mood normal.        Behavior: Behavior normal.        Thought Content: Thought content normal.        Judgment: Judgment normal.      BP (!) 141/78   Pulse 71   Ht 4\' 10"  (1.473 m)   Wt 176 lb (79.8 kg)   BMI 36.78 kg/m   Past Medical History:  Diagnosis Date  . Anxiety   . Arthritis   . Asthma   . Edema    feet  . Emphysema of lung ([place])   . GERD (gastroesophageal reflux disease)   . Headache    MIGRAINES  . History of orthopnea   . Hypercholesterolemia   . Hypothyroidism    NODULES  . Lymphedema   . Migraine   . Non-small cell lung cancer, right ([place]) 05/2019   Rad tx's  . Shortness of breath dyspnea    WITH EXERTION  . Sleep apnea    MILD, DOES NOT USE CPAP  . Thyroid nodule     Social History   Socioeconomic History  . Marital status: Married    Spouse name: Not on file  . Number of children: Not on file  . Years of education: Not on file  . Highest education level: Not on file  Occupational History  . Not on file  Tobacco Use  . Smoking status: Never Smoker  . Smokeless tobacco:  Never Used  Substance and Sexual Activity  . Alcohol use: No  . Drug use: Never  . Sexual activity: Not on file  Other Topics Concern  . Not on file  Social History Narrative  . Not on file   Social Determinants of Health   Financial Resource Strain:   . Difficulty of Paying Living Expenses: Not on file  Food Insecurity:   . Worried About Charity fundraiser in the Last Year: Not on file  . Ran Out of Food in the Last Year: Not on file  Transportation Needs:   . Lack of Transportation (Medical): Not on file  . Lack of Transportation (Non-Medical): Not on file  Physical Activity:   . Days of Exercise per Week: Not on file  . Minutes of Exercise per Session: Not on file  Stress:   . Feeling of Stress : Not on file  Social Connections:   . Frequency of Communication with Friends and Family: Not on file  . Frequency of Social Gatherings with Friends and Family: Not on file  . Attends Religious Services: Not on file  . Active Member of Clubs or Organizations: Not on file  . Attends Theatre manager Meetings: Not on file  . Marital Status: Not on file  Intimate Partner Violence:   . Fear of Current or Ex-Partner: Not on file  . Emotionally Abused: Not on file  . Physically Abused: Not on file  . Sexually Abused: Not on file    Past Surgical History:  Procedure Laterality Date  . ABDOMINAL HYSTERECTOMY    . BREAST BIOPSY    . CARDIAC CATHETERIZATION    . CATARACT EXTRACTION W/PHACO Left 02/12/2016   Procedure: CATARACT EXTRACTION PHACO AND INTRAOCULAR LENS PLACEMENT (IOC);  Surgeon: Eulogio Bear, MD;  Location: [facility] ORS;  Service: Ophthalmology;  Laterality: Left;  [place] 01:30 AP% 15.2 CDE 13.71 Fluid pack lot # [id number]  . CATARACT EXTRACTION W/PHACO Right 03/18/2016   Procedure: CATARACT EXTRACTION PHACO AND INTRAOCULAR LENS PLACEMENT ([place]);  Surgeon: Eulogio Bear, MD;  Location: [facility] ORS;  Service: Ophthalmology;  Laterality: Right;  Lot # [id number] H [place]: 01:05.5 AP%:14.3 CDE: 9.33  . FRACTURE SURGERY Left    arm rod and screw  . KNEE ARTHROSCOPY    . OOPHORECTOMY    . RCR    . ROTATOR CUFF REPAIR Left 2006    Family History  Problem Relation Age of Onset  . Emphysema Mother   . Heart Problems Mother   . Tuberculosis Father   . Brain cancer Father   . Skin cancer Father   . Breast cancer Sister   . Irritable bowel syndrome Sister   . Lung cancer Brother   . Dementia Sister   . Schizophrenia Sister     Allergies  Allergen Reactions  . Augmentin [Amoxicillin-Pot Clavulanate]   . Celecoxib   . Eryc [Erythromycin]   . Percocet [Oxycodone-Acetaminophen]     CBC Latest Ref Rng & Units 07/25/2019 05/08/2016 10/16/2012  WBC 4.0 - 10.5 K/uL 3.7(L) 14.6(H) 10.2  Hemoglobin 12.0 - 15.0 g/dL 13.1 14.8 15.9  Hematocrit 36 - 46 % 40.2 43.6 46.1  Platelets 150 - 400 K/uL 186 233 210      CMP     Component Value Date/Time   NA 134 (L) 07/25/2019 1020   NA 136 10/16/2012 1600   K 4.3 07/25/2019 1020   K 3.6 10/16/2012 1600   CL 98 07/25/2019  1020   CL 107  10/16/2012 1600   CO2 27 07/25/2019 1020   CO2 23 10/16/2012 1600   GLUCOSE 102 (H) 07/25/2019 1020   GLUCOSE 111 (H) 10/16/2012 1600   BUN 11 07/25/2019 1020   BUN 14 10/16/2012 1600   CREATININE 0.60 01/24/2020 0959   CREATININE 0.85 10/16/2012 1600   CALCIUM 8.9 07/25/2019 1020   CALCIUM 8.6 10/16/2012 1600   PROT 6.6 07/25/2019 1020   PROT 6.4 10/16/2012 1600   ALBUMIN 3.8 07/25/2019 1020   ALBUMIN 3.4 10/16/2012 1600   AST 19 07/25/2019 1020   AST 25 10/16/2012 1600   ALT 14 07/25/2019 1020   ALT 20 10/16/2012 1600   ALKPHOS 110 07/25/2019 1020   ALKPHOS 111 10/16/2012 1600   BILITOT 0.6 07/25/2019 1020   BILITOT 0.4 10/16/2012 1600   GFRNONAA >60 07/25/2019 1020   GFRNONAA >60 10/16/2012 1600   GFRAA >60 07/25/2019 1020   GFRAA >60 10/16/2012 1600     No results found.     Assessment & Plan:   1. Lymphedema I have had a long discussion with the patient regarding swelling and why it  causes symptoms.  Patient will begin wearing graduated compression stockings class 1 (20-30 mmHg) on a daily basis a prescription was given. The patient will  beginning wearing the stockings first thing in the morning and removing them in the evening. The patient is instructed specifically not to sleep in the stockings.   In addition, behavioral modification will be initiated.  This will include frequent elevation, use of over the counter pain medications and exercise such as walking.  I have reviewed systemic causes for chronic edema such as liver, kidney and cardiac etiologies.  The patient denies problems with these organ systems.    Consideration for a lymph pump will also be made based upon the effectiveness of conservative therapy.  This would help to improve the edema control and prevent sequela such as ulcers and infections   Patient should undergo duplex ultrasound of the venous system to ensure that DVT or reflux is not present.  The patient will follow-up with  me after the ultrasound.   - VAS [place] LOWER EXTREMITY VENOUS REFLUX; Future  2. Pain in both lower extremities  Recommend:  The patient has atypical pain symptoms for pure atherosclerotic disease. 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  Noninvasive studies including ABI's and venous ultrasound of the legs will be obtained and the patient will follow up with me to review these studies.  I suspect the patient is c/o pseudoclaudication.  Patient should have an evaluation of his LS spine which I defer to the primary service.  The patient should continue walking and begin a more formal exercise program. The patient should continue his antiplatelet therapy and aggressive treatment of the lipid abnormalities.  The patient should begin wearing graduated compression socks 15-20 mmHg strength to control edema.  - VAS [place] ABI WITH/WO TBI; Future  3. Primary hypertension Continue antihypertensive medications as already ordered, these medications have been reviewed and there are no changes at this time.   4. Pure hypercholesterolemia Continue statin as ordered and reviewed, no changes at this time    Current Outpatient Medications on File Prior to Visit  Medication Sig Dispense Refill  . aspirin EC 81 MG tablet Take 81 mg by mouth daily.    . cetirizine (ZYRTEC) 10 MG tablet Take  10 mg by mouth at bedtime.     . Cholecalciferol (VITAMIN D PO) Take 5,000 Units by mouth daily.    . Cyanocobalamin (VITAMIN B-12 PO) Take 2 tablets by mouth daily.    . fluticasone (FLONASE) 50 MCG/ACT nasal spray Place 2 sprays into both nostrils daily.    Marland Kitchen loratadine (CLARITIN) 10 MG tablet Take 10 mg by mouth daily.    . montelukast (SINGULAIR) 10 MG tablet Take 10 mg by mouth at bedtime.     . Multiple Vitamins-Minerals (PRESERVISION AREDS PO) Take 1 capsule by mouth 2 (two) times  daily.    Marland Kitchen omeprazole (PRILOSEC) 20 MG capsule Take 20 mg by mouth 2 (two) times daily before a meal.    . TRELEGY ELLIPTA 100-62.5-25 MCG/INH AEPB Inhale 1 puff into the lungs daily.    . Turmeric 500 MG TABS Take 1 tablet by mouth 1 day or 1 dose.    . VENTOLIN HFA 108 (90 Base) MCG/ACT inhaler Inhale 2 puffs into the lungs as needed.    . furosemide (LASIX) 20 MG tablet Take by mouth.      No current facility-administered medications on file prior to visit.    There are no Patient Instructions on file for this visit. No follow-ups on file.   Kris Hartmann, NP

## 2020-04-21 ENCOUNTER — Other Ambulatory Visit: Payer: Self-pay

## 2020-04-21 ENCOUNTER — Ambulatory Visit
Admission: RE | Admit: 2020-04-21 | Discharge: 2020-04-21 | Disposition: A | Discharge status: Home / Self Care | Payer: Medicare HMO | Source: Ambulatory Visit | Attending: Internal Medicine | Admitting: Internal Medicine

## 2020-04-21 DIAGNOSIS — R55 Syncope and collapse: Secondary | ICD-10-CM | POA: Insufficient documentation

## 2020-04-22 ENCOUNTER — Other Ambulatory Visit: Payer: Self-pay

## 2020-04-22 ENCOUNTER — Encounter: Payer: Medicare HMO | Admitting: *Deleted

## 2020-04-22 DIAGNOSIS — C3490 Malignant neoplasm of unspecified part of unspecified bronchus or lung: Secondary | ICD-10-CM

## 2020-04-22 NOTE — Progress Notes (Signed)
Daily Session Note  Patient Details  Name: Lindsey Thomas MRN: [id number] Date of Birth: [birth year] Referring Provider:     Pulmonary Rehab from 02/21/2020 in [facility] Cardiac and Pulmonary Rehab  Referring Provider [name], NP      Encounter Date: 04/22/2020  Check In:  Session Check In - 04/22/20 1230      Check-In   Supervising physician immediately available to respond to emergencies See telemetry face sheet for immediately available ER MD    Location [facility]-Cardiac & Pulmonary Rehab    Staff Present Alberteen Sam, MA, RCEP, CCRP, Marylynn Pearson, MS Exercise Physiologist    Virtual Visit No    Medication changes reported     No    Fall or balance concerns reported    No    Warm-up and Cool-down Performed on first and last piece of equipment    Resistance Training Performed Yes    VAD Patient? No    PAD/SET Patient? No      Pain Assessment   Currently in Pain? No/denies              Social History   Tobacco Use  Smoking Status Never Smoker  Smokeless Tobacco Never Used    Goals Met:  Proper associated with RPD/PD & O2 Sat Independence with exercise equipment Exercise tolerated well No report of cardiac concerns or symptoms Strength training completed today  Goals Unmet:  Not Applicable  Comments: Pt able to follow exercise prescription today without complaint.  Will continue to monitor for progression.    Dr. Emily Filbert is Medical Director for [place] and [facility].

## 2020-04-29 ENCOUNTER — Other Ambulatory Visit: Payer: Self-pay

## 2020-04-29 DIAGNOSIS — C3490 Malignant neoplasm of unspecified part of unspecified bronchus or lung: Secondary | ICD-10-CM

## 2020-04-29 NOTE — Progress Notes (Signed)
Daily Session Note  Patient Details  Name: Lindsey Thomas MRN: [id number] Date of Birth: Apr 09, 1941 Referring Provider:     Pulmonary Rehab from 02/21/2020 in [facility] Cardiac and Pulmonary Rehab  Referring Provider [name], NP      Encounter Date: 04/29/2020  Check In:  Session Check In - 04/29/20 1242      Check-In   Supervising physician immediately available to respond to emergencies See telemetry face sheet for immediately available ER MD    Location [facility]-Cardiac & Pulmonary Rehab    Staff Present Alberteen Sam, MA, RCEP, CCRP, Marylynn Pearson, MS Exercise Physiologist    Virtual Visit No    Medication changes reported     No    Fall or balance concerns reported    No    Warm-up and Cool-down Performed on first and last piece of equipment    Resistance Training Performed Yes    VAD Patient? No    PAD/SET Patient? No      Pain Assessment   Currently in Pain? No/denies              Social History   Tobacco Use  Smoking Status Never Smoker  Smokeless Tobacco Never Used    Goals Met:  Independence with exercise equipment Exercise tolerated well No report of cardiac concerns or symptoms Strength training completed today  Goals Unmet:  Not Applicable  Comments: Pt able to follow exercise prescription today without complaint.  Will continue to monitor for progression.    Dr. Emily Filbert is Medical Director for [place] and [facility].

## 2020-05-06 ENCOUNTER — Other Ambulatory Visit: Payer: Self-pay

## 2020-05-06 DIAGNOSIS — C3490 Malignant neoplasm of unspecified part of unspecified bronchus or lung: Secondary | ICD-10-CM

## 2020-05-06 NOTE — Progress Notes (Signed)
Daily Session Note  Patient Details  Name: Lindsey Thomas MRN: [id number] Date of Birth: Mar 09, 1941 Referring Provider:     Pulmonary Rehab from 02/21/2020 in [facility] Cardiac and Pulmonary Rehab  Referring Provider [name], NP      Encounter Date: 05/06/2020  Check In:  Session Check In - 05/06/20 1301      Check-In   Supervising physician immediately available to respond to emergencies See telemetry face sheet for immediately available ER MD    Location [facility]-Cardiac & Pulmonary Rehab    Staff Present Basilia Jumbo, RN, Margurite Auerbach, MS Exercise Physiologist;Amanda Oletta Darter, BA, ACSM CEP, Exercise Physiologist    Virtual Visit No    Medication changes reported     No    Fall or balance concerns reported    No    Tobacco Cessation No Change    Warm-up and Cool-down Performed on first and last piece of equipment    Resistance Training Performed Yes    VAD Patient? No    PAD/SET Patient? No      Pain Assessment   Currently in Pain? No/denies              Social History   Tobacco Use  Smoking Status Never Smoker  Smokeless Tobacco Never Used    Goals Met:  Independence with exercise equipment Exercise tolerated well No report of cardiac concerns or symptoms  Goals Unmet:  Not Applicable  Comments: Pt able to follow exercise prescription today without complaint.  Will continue to monitor for progression.    Dr. Emily Filbert is Medical Director for [place] and [facility].

## 2020-05-07 ENCOUNTER — Encounter (INDEPENDENT_AMBULATORY_CARE_PROVIDER_SITE_OTHER): Payer: Self-pay | Admitting: Nurse Practitioner

## 2020-05-07 ENCOUNTER — Ambulatory Visit (INDEPENDENT_AMBULATORY_CARE_PROVIDER_SITE_OTHER): Payer: Medicare HMO

## 2020-05-07 ENCOUNTER — Ambulatory Visit (INDEPENDENT_AMBULATORY_CARE_PROVIDER_SITE_OTHER): Payer: Medicare HMO | Admitting: Nurse Practitioner

## 2020-05-07 VITALS — BP 124/73 | HR 71 | Resp 16 | Wt 175.8 lb

## 2020-05-07 DIAGNOSIS — I890 Lymphedema, not elsewhere classified: Secondary | ICD-10-CM

## 2020-05-07 DIAGNOSIS — M79604 Pain in right leg: Secondary | ICD-10-CM

## 2020-05-07 DIAGNOSIS — M79605 Pain in left leg: Secondary | ICD-10-CM

## 2020-05-07 DIAGNOSIS — I10 Essential (primary) hypertension: Secondary | ICD-10-CM

## 2020-05-09 DIAGNOSIS — I771 Stricture of artery: Secondary | ICD-10-CM | POA: Diagnosis not present

## 2020-05-09 DIAGNOSIS — I6523 Occlusion and stenosis of bilateral carotid arteries: Secondary | ICD-10-CM | POA: Diagnosis not present

## 2020-05-09 DIAGNOSIS — R55 Syncope and collapse: Secondary | ICD-10-CM | POA: Diagnosis not present

## 2020-05-13 ENCOUNTER — Other Ambulatory Visit: Payer: Self-pay

## 2020-05-13 ENCOUNTER — Encounter: Payer: Medicare HMO | Attending: Oncology | Admitting: *Deleted

## 2020-05-13 ENCOUNTER — Encounter (INDEPENDENT_AMBULATORY_CARE_PROVIDER_SITE_OTHER): Payer: Self-pay | Admitting: Nurse Practitioner

## 2020-05-13 DIAGNOSIS — C3490 Malignant neoplasm of unspecified part of unspecified bronchus or lung: Secondary | ICD-10-CM | POA: Insufficient documentation

## 2020-05-13 NOTE — Progress Notes (Signed)
Daily Session Note  Patient Details  Name: MAGDALA BRAHMBHATT MRN: [id number] Date of Birth: [birth year] Referring Provider:     Pulmonary Rehab from 02/21/2020 in [facility] Cardiac and Pulmonary Rehab  Referring Provider [name], NP      Encounter Date: 05/13/2020  Check In:  Session Check In - 05/13/20 1231      Check-In   Supervising physician immediately available to respond to emergencies See telemetry face sheet for immediately available ER MD    Location [facility]-Cardiac & Pulmonary Rehab    Staff Present Alberteen Sam, MA, RCEP, CCRP, Marylynn Pearson, MS Exercise Physiologist    Virtual Visit No    Medication changes reported     No    Fall or balance concerns reported    No    Warm-up and Cool-down Performed on first and last piece of equipment    Resistance Training Performed Yes    VAD Patient? No    PAD/SET Patient? No      Pain Assessment   Currently in Pain? No/denies              Social History   Tobacco Use  Smoking Status Never Smoker  Smokeless Tobacco Never Used    Goals Met:  Proper associated with RPD/PD & O2 Sat Independence with exercise equipment Exercise tolerated well No report of cardiac concerns or symptoms Strength training completed today  Goals Unmet:  Not Applicable  Comments: Pt able to follow exercise prescription today without complaint.  Will continue to monitor for progression.    Dr. Emily Filbert is Medical Director for [place] and [facility].

## 2020-05-13 NOTE — Progress Notes (Signed)
Subjective:    Patient ID: Lindsey Thomas, female    DOB: [birth year], 79 y.o.   MRN: [id number] Chief Complaint  Patient presents with  . Follow-up    ultrasound follow up    The patient presents today for follow-up in regards to her lymphedema.  Previously the patient had better control of her lower extremity edema.  She recently visited her primary care physician who noticed a worsening of her lower extremity edema.  The patient notes that her legs feel heavy within the last 2 to 3 weeks.  They feel like they want to give out.  The patient notes that a few weeks ago she had a fall and she landed on her right side.  She was at a very awkward angle and she stayed on her right side for approximately 15 to 20 minutes until help arrived and was able to get her to a upright position.  The patient also does have some back pain with lifting.  The patient also notes that she stands for long periods and has not been as consistent with lymphedema pump usage.  Patient has also had a recent lung cancer diagnosis as well.  The patient notes that the pain is worse in her left leg than her right.  She denies any fever, chills, nausea, vomiting or diarrhea.  She denies chest pain or shortness of breath.  Today noninvasive studies show no evidence of DVT or superficial thrombosis bilaterally.  There is evidence of superficial venous reflux in the right lower extremity in the great saphenous vein at the mid thigh extending to the proximal calf.  There is no evidence of superficial venous reflux in the left lower extremity.  There is no evidence of deep venous insufficiency seen bilaterally.  It is noted that the patient has 2 Baker's cyst behind her right knee as well as one behind her left.  Today noninvasive studies show an ABI of 1.18 on the right and 1.19 on the left.  The patient has strong triphasic tibial artery waveforms bilaterally with good toe waveforms.  The patient also had her perfusion tested post  exercise and there was no decrease in perfusion.  He remained essentially unchanged.   Review of Systems  Cardiovascular: Positive for leg swelling.  Musculoskeletal: Positive for gait problem.  Neurological: Positive for weakness.  All other systems reviewed and are negative.      Objective:   Physical Exam Vitals reviewed.  HENT:     Head: Normocephalic.  Cardiovascular:     Rate and Rhythm: Normal rate.     Pulses: Normal pulses.  Pulmonary:     Effort: Pulmonary effort is normal.  Musculoskeletal:     Right lower leg: Edema present.     Left lower leg: Edema present.  Skin:    General: Skin is warm and dry.  Neurological:     Mental Status: She is alert and oriented to person, place, and time.  Psychiatric:        Mood and Affect: Mood normal.        Behavior: Behavior normal.        Thought Content: Thought content normal.        Judgment: Judgment normal.     BP 124/73 (BP Location: Right Arm)   Pulse 71   Resp 16   Wt 175 lb 12.8 oz (79.7 kg)   BMI 36.74 kg/m   Past Medical History:  Diagnosis Date  . Anxiety   . Arthritis   .  Asthma   . Edema    feet  . Emphysema of lung ([place])   . GERD (gastroesophageal reflux disease)   . Headache    MIGRAINES  . History of orthopnea   . Hypercholesterolemia   . Hypothyroidism    NODULES  . Lymphedema   . Migraine   . Non-small cell lung cancer, right ([place]) 05/2019   Rad tx's  . Shortness of breath dyspnea    WITH EXERTION  . Sleep apnea    MILD, DOES NOT USE CPAP  . Thyroid nodule     Social History   Socioeconomic History  . Marital status: Married    Spouse name: Not on file  . Number of children: Not on file  . Years of education: Not on file  . Highest education level: Not on file  Occupational History  . Not on file  Tobacco Use  . Smoking status: Never Smoker  . Smokeless tobacco: Never Used  Substance and Sexual Activity  . Alcohol use: No  . Drug use: Never  . Sexual activity: Not  on file  Other Topics Concern  . Not on file  Social History Narrative  . Not on file   Social Determinants of Health   Financial Resource Strain:   . Difficulty of Paying Living Expenses: Not on file  Food Insecurity:   . Worried About Charity fundraiser in the Last Year: Not on file  . Ran Out of Food in the Last Year: Not on file  Transportation Needs:   . Lack of Transportation (Medical): Not on file  . Lack of Transportation (Non-Medical): Not on file  Physical Activity:   . Days of Exercise per Week: Not on file  . Minutes of Exercise per Session: Not on file  Stress:   . Feeling of Stress : Not on file  Social Connections:   . Frequency of Communication with Friends and Family: Not on file  . Frequency of Social Gatherings with Friends and Family: Not on file  . Attends Religious Services: Not on file  . Active Member of Clubs or Organizations: Not on file  . Attends Archivist Meetings: Not on file  . Marital Status: Not on file  Intimate Partner Violence:   . Fear of Current or Ex-Partner: Not on file  . Emotionally Abused: Not on file  . Physically Abused: Not on file  . Sexually Abused: Not on file    Past Surgical History:  Procedure Laterality Date  . ABDOMINAL HYSTERECTOMY    . BREAST BIOPSY    . CARDIAC CATHETERIZATION    . CATARACT EXTRACTION W/PHACO Left 02/12/2016   Procedure: CATARACT EXTRACTION PHACO AND INTRAOCULAR LENS PLACEMENT (IOC);  Surgeon: Eulogio Bear, MD;  Location: [facility] ORS;  Service: Ophthalmology;  Laterality: Left;  [place] 01:30 AP% 15.2 CDE 13.71 Fluid pack lot # [id number]  . CATARACT EXTRACTION W/PHACO Right 03/18/2016   Procedure: CATARACT EXTRACTION PHACO AND INTRAOCULAR LENS PLACEMENT ([place]);  Surgeon: Eulogio Bear, MD;  Location: [facility] ORS;  Service: Ophthalmology;  Laterality: Right;  Lot # [id number] H [place]: 01:05.5 AP%:14.3 CDE: 9.33  . FRACTURE SURGERY Left    arm rod and screw  . KNEE ARTHROSCOPY    . OOPHORECTOMY     . RCR    . ROTATOR CUFF REPAIR Left 2006    Family History  Problem Relation Age of Onset  . Emphysema Mother   . Heart Problems Mother   . Tuberculosis Father   .  Brain cancer Father   . Skin cancer Father   . Breast cancer Sister   . Irritable bowel syndrome Sister   . Lung cancer Brother   . Dementia Sister   . Schizophrenia Sister     Allergies  Allergen Reactions  . Augmentin [Amoxicillin-Pot Clavulanate]   . Celecoxib   . Eryc [Erythromycin]   . Percocet [Oxycodone-Acetaminophen]     CBC Latest Ref Rng & Units 07/25/2019 05/08/2016 10/16/2012  WBC 4.0 - 10.5 K/uL 3.7(L) 14.6(H) 10.2  Hemoglobin 12.0 - 15.0 g/dL 13.1 14.8 15.9  Hematocrit 36 - 46 % 40.2 43.6 46.1  Platelets 150 - 400 K/uL 186 233 210      CMP     Component Value Date/Time   NA 134 (L) 07/25/2019 1020   NA 136 10/16/2012 1600   K 4.3 07/25/2019 1020   K 3.6 10/16/2012 1600   CL 98 07/25/2019 1020   CL 107 10/16/2012 1600   CO2 27 07/25/2019 1020   CO2 23 10/16/2012 1600   GLUCOSE 102 (H) 07/25/2019 1020   GLUCOSE 111 (H) 10/16/2012 1600   BUN 11 07/25/2019 1020   BUN 14 10/16/2012 1600   CREATININE 0.60 01/24/2020 0959   CREATININE 0.85 10/16/2012 1600   CALCIUM 8.9 07/25/2019 1020   CALCIUM 8.6 10/16/2012 1600   PROT 6.6 07/25/2019 1020   PROT 6.4 10/16/2012 1600   ALBUMIN 3.8 07/25/2019 1020   ALBUMIN 3.4 10/16/2012 1600   AST 19 07/25/2019 1020   AST 25 10/16/2012 1600   ALT 14 07/25/2019 1020   ALT 20 10/16/2012 1600   ALKPHOS 110 07/25/2019 1020   ALKPHOS 111 10/16/2012 1600   BILITOT 0.6 07/25/2019 1020   BILITOT 0.4 10/16/2012 1600   GFRNONAA >60 07/25/2019 1020   GFRNONAA >60 10/16/2012 1600   GFRAA >60 07/25/2019 1020   GFRAA >60 10/16/2012 1600     No results found.     Assessment & Plan:   1. Pain in both lower extremities Recommend:  I do not find evidence of Vascular pathology that would explain the patient's symptoms  The patient has atypical pain  symptoms for vascular disease  I do not find evidence of Vascular pathology that would explain the patient's symptoms and I suspect the patient is c/o pseudoclaudication.  Patient should have an evaluation of his LS spine which I defer to the primary service.  Noninvasive studies including venous ultrasound of the legs do not identify vascular problems  The patient should continue walking and begin a more formal exercise program. The patient should continue his antiplatelet therapy and aggressive treatment of the lipid abnormalities.  Patient will follow-up with me on a PRN basis  Further work-up of her lower extremity pain is deferred to the primary service     2. Lymphedema  No surgery or intervention at this point in time.    I have reviewed my discussion with the patient regarding lymphedema and why it  causes symptoms.  Patient will continue wearing graduated compression stockings class 1 (20-30 mmHg) on a daily basis a prescription was given. The patient is reminded to put the stockings on first thing in the morning and removing them in the evening. The patient is instructed specifically not to sleep in the stockings.   In addition, behavioral modification throughout the day will be continued.  This will include frequent elevation (such as in a recliner), use of over the counter pain medications as needed and exercise such as walking.  I have reviewed systemic causes for chronic edema such as liver, kidney and cardiac etiologies and there does not appear to be any significant changes in these organ systems over the past year.  The patient is under the impression that these organ systems are all stable and unchanged.    The patient will follow-up with me on an annual basis.    3. Primary hypertension Continue antihypertensive medications as already ordered, these medications have been reviewed and there are no changes at this time.    Current Outpatient Medications on File Prior  to Visit  Medication Sig Dispense Refill  . aspirin EC 81 MG tablet Take 81 mg by mouth daily.    . cetirizine (ZYRTEC) 10 MG tablet Take 10 mg by mouth at bedtime.     . Cholecalciferol (VITAMIN D PO) Take 5,000 Units by mouth daily.    . Cyanocobalamin (VITAMIN B-12 PO) Take 2 tablets by mouth daily.    . fluticasone (FLONASE) 50 MCG/ACT nasal spray Place 2 sprays into both nostrils daily.    Marland Kitchen ibandronate (BONIVA) 150 MG tablet 150 mg.     . loratadine (CLARITIN) 10 MG tablet Take 10 mg by mouth daily.    . montelukast (SINGULAIR) 10 MG tablet Take 10 mg by mouth at bedtime.     . Multiple Vitamins-Minerals (PRESERVISION AREDS PO) Take 1 capsule by mouth 2 (two) times daily.    Marland Kitchen omeprazole (PRILOSEC) 20 MG capsule Take 20 mg by mouth 2 (two) times daily before a meal.    . TRELEGY ELLIPTA 100-62.5-25 MCG/INH AEPB Inhale 1 puff into the lungs daily.    . Turmeric 500 MG TABS Take 1 tablet by mouth 1 day or 1 dose.    . VENTOLIN HFA 108 (90 Base) MCG/ACT inhaler Inhale 2 puffs into the lungs as needed.    . furosemide (LASIX) 20 MG tablet Take by mouth.      No current facility-administered medications on file prior to visit.    There are no Patient Instructions on file for this visit. No follow-ups on file.   Kris Hartmann, NP

## 2020-05-14 DIAGNOSIS — G4733 Obstructive sleep apnea (adult) (pediatric): Secondary | ICD-10-CM | POA: Diagnosis not present

## 2020-05-15 ENCOUNTER — Encounter: Payer: Self-pay | Admitting: Radiation Oncology

## 2020-05-15 ENCOUNTER — Ambulatory Visit
Admission: RE | Admit: 2020-05-15 | Discharge: 2020-05-15 | Disposition: A | Discharge status: Home / Self Care | Payer: Medicare HMO | Source: Ambulatory Visit | Attending: Radiation Oncology | Admitting: Radiation Oncology

## 2020-05-15 ENCOUNTER — Other Ambulatory Visit: Payer: Self-pay

## 2020-05-15 VITALS — BP 132/62 | HR 84 | Temp 97.2°F | Resp 16 | Wt 178.2 lb

## 2020-05-15 DIAGNOSIS — M8580 Other specified disorders of bone density and structure, unspecified site: Secondary | ICD-10-CM | POA: Insufficient documentation

## 2020-05-15 DIAGNOSIS — C3411 Malignant neoplasm of upper lobe, right bronchus or lung: Secondary | ICD-10-CM | POA: Diagnosis not present

## 2020-05-15 DIAGNOSIS — Z923 Personal history of irradiation: Secondary | ICD-10-CM | POA: Diagnosis not present

## 2020-05-15 DIAGNOSIS — R911 Solitary pulmonary nodule: Secondary | ICD-10-CM

## 2020-05-15 NOTE — Progress Notes (Signed)
Radiation Oncology Follow up Note  Name: Lindsey Thomas   Date:   05/15/2020 MRN:  [id number] DOB: Dec 06, 1940    This 79 y.o. female presents to the clinic today for 19-month follow-up status post SBRT to her right upper lobe for stage I non-small cell lung cancer.  REFERRING PROVIDER: Idelle Crouch, MD  HPI: Patient is a 79 year old female now at 10 months having completed SBRT to her right upper lobe for stage I non-small cell lung cancer.  She is seen today in routine follow-up she states her pulmonary function seem to be declining somewhat she is under supervision by Dr. Raul Del and will see him early next week..  She also had a bone density study showing osteopenia and has been recommended Boniva which she has not started yet.  She specifically denies cough hemoptysis or chest tightness.  Recent CT scan which I have reviewed showed radiation changes in the lateral right upper lobe consistent with radiation.  She has stable pulmonary nodules measuring 4 mm or less.  She also had a CT scan of the head which showed no evidence of pathology.  COMPLICATIONS OF TREATMENT: none  FOLLOW UP COMPLIANCE: keeps appointments   PHYSICAL EXAM:  BP 132/62 (BP Location: Left Arm, Patient Position: Sitting)   Pulse 84   Temp (!) 97.2 F (36.2 C) (Tympanic)   Resp 16   Wt 178 lb 3.2 oz (80.8 kg)   BMI 37.24 kg/m  Well-developed well-nourished patient in NAD. HEENT reveals PERLA, EOMI, discs not visualized.  Oral cavity is clear. No oral mucosal lesions are identified. Neck is clear without evidence of cervical or supraclavicular adenopathy. Lungs are clear to A&P. Cardiac examination is essentially unremarkable with regular rate and rhythm without murmur rub or thrill. Abdomen is benign with no organomegaly or masses noted. Motor sensory and DTR levels are equal and symmetric in the upper and lower extremities. Cranial nerves II through XII are grossly intact. Proprioception is intact. No peripheral  adenopathy or edema is identified. No motor or sensory levels are noted. Crude visual fields are within normal range.  RADIOLOGY RESULTS: CT scan of chest and brain both reviewed compatible with above-stated findings  PLAN: Present time patient is doing well with no evidence of disease now 10 months out from SBRT.  And pleased with her overall progress.  I have asked to see her back in 6 months for follow-up.  Expect a CT scan prior to that visit.  She continues close follow-up care with Dr. Raul Del and pulmonology as well as Dr. Janese Banks.  Patient knows to call with any concerns.  I would like to take this opportunity to thank you for allowing me to participate in the care of your patient.Noreene Filbert, MD

## 2020-05-19 DIAGNOSIS — Z85118 Personal history of other malignant neoplasm of bronchus and lung: Secondary | ICD-10-CM | POA: Diagnosis not present

## 2020-05-19 DIAGNOSIS — G4733 Obstructive sleep apnea (adult) (pediatric): Secondary | ICD-10-CM | POA: Diagnosis not present

## 2020-05-19 DIAGNOSIS — R059 Cough, unspecified: Secondary | ICD-10-CM | POA: Diagnosis not present

## 2020-05-19 DIAGNOSIS — R0600 Dyspnea, unspecified: Secondary | ICD-10-CM | POA: Diagnosis not present

## 2020-05-19 DIAGNOSIS — J439 Emphysema, unspecified: Secondary | ICD-10-CM | POA: Diagnosis not present

## 2020-05-20 ENCOUNTER — Encounter: Payer: Medicare HMO | Admitting: *Deleted

## 2020-05-20 ENCOUNTER — Other Ambulatory Visit: Payer: Self-pay

## 2020-05-20 DIAGNOSIS — C3490 Malignant neoplasm of unspecified part of unspecified bronchus or lung: Secondary | ICD-10-CM

## 2020-05-20 NOTE — Progress Notes (Signed)
Daily Session Note  Patient Details  Name: Lindsey Thomas MRN: [id number] Date of Birth: 06-28-1940 Referring Provider:   [facility] from 02/21/2020 in [facility]  Referring Provider [name], NP      Encounter Date: 05/20/2020  Check In:  Session Check In - 05/20/20 1233      Check-In   Supervising physician immediately available to respond to emergencies See telemetry face sheet for immediately available ER MD    Location [facility]    Staff Present Alberteen Sam, MA, RCEP, CCRP, Marylynn Pearson, MS Exercise Physiologist    Virtual Visit No    Medication changes reported     Yes    Comments Off Trelegy, On Cymbalta    Fall or balance concerns reported    No    Warm-up and Cool-down Performed on first and last piece of equipment    Resistance Training Performed Yes    VAD Patient? No    PAD/SET Patient? No      Pain Assessment   Currently in Pain? No/denies              Social History   Tobacco Use  Smoking Status Never Smoker  Smokeless Tobacco Never Used    Goals Met:  Proper associated with RPD/PD & O2 Sat Independence with exercise equipment Exercise tolerated well No report of cardiac concerns or symptoms Strength training completed today  Goals Unmet:  Not Applicable  Comments: Pt able to follow exercise prescription today without complaint.  Will continue to monitor for progression.    Dr. Emily Filbert is Medical Director for [place] and [facility].

## 2020-05-23 ENCOUNTER — Other Ambulatory Visit: Payer: Self-pay

## 2020-05-23 ENCOUNTER — Emergency Department
Admission: EM | Admit: 2020-05-23 | Discharge: 2020-05-23 | Disposition: A | Discharge status: Home / Self Care | Payer: Medicare HMO | Attending: Emergency Medicine | Admitting: Emergency Medicine

## 2020-05-23 ENCOUNTER — Emergency Department: Payer: Medicare HMO

## 2020-05-23 DIAGNOSIS — R519 Headache, unspecified: Secondary | ICD-10-CM | POA: Insufficient documentation

## 2020-05-23 DIAGNOSIS — J45909 Unspecified asthma, uncomplicated: Secondary | ICD-10-CM | POA: Diagnosis not present

## 2020-05-23 DIAGNOSIS — Y92410 Unspecified street and highway as the place of occurrence of the external cause: Secondary | ICD-10-CM | POA: Insufficient documentation

## 2020-05-23 DIAGNOSIS — I10 Essential (primary) hypertension: Secondary | ICD-10-CM | POA: Insufficient documentation

## 2020-05-23 DIAGNOSIS — S2001XA Contusion of right breast, initial encounter: Secondary | ICD-10-CM | POA: Insufficient documentation

## 2020-05-23 DIAGNOSIS — S2000XA Contusion of breast, unspecified breast, initial encounter: Secondary | ICD-10-CM

## 2020-05-23 DIAGNOSIS — S5012XA Contusion of left forearm, initial encounter: Secondary | ICD-10-CM | POA: Insufficient documentation

## 2020-05-23 DIAGNOSIS — S20211A Contusion of right front wall of thorax, initial encounter: Secondary | ICD-10-CM | POA: Diagnosis not present

## 2020-05-23 DIAGNOSIS — R079 Chest pain, unspecified: Secondary | ICD-10-CM | POA: Diagnosis not present

## 2020-05-23 DIAGNOSIS — Z7982 Long term (current) use of aspirin: Secondary | ICD-10-CM | POA: Diagnosis not present

## 2020-05-23 DIAGNOSIS — R0789 Other chest pain: Secondary | ICD-10-CM | POA: Diagnosis not present

## 2020-05-23 DIAGNOSIS — E039 Hypothyroidism, unspecified: Secondary | ICD-10-CM | POA: Diagnosis not present

## 2020-05-23 DIAGNOSIS — Z79899 Other long term (current) drug therapy: Secondary | ICD-10-CM | POA: Insufficient documentation

## 2020-05-23 DIAGNOSIS — M542 Cervicalgia: Secondary | ICD-10-CM | POA: Diagnosis not present

## 2020-05-23 DIAGNOSIS — I2510 Atherosclerotic heart disease of native coronary artery without angina pectoris: Secondary | ICD-10-CM | POA: Diagnosis not present

## 2020-05-23 DIAGNOSIS — S161XXA Strain of muscle, fascia and tendon at neck level, initial encounter: Secondary | ICD-10-CM | POA: Diagnosis not present

## 2020-05-23 DIAGNOSIS — S199XXA Unspecified injury of neck, initial encounter: Secondary | ICD-10-CM | POA: Diagnosis present

## 2020-05-23 DIAGNOSIS — I728 Aneurysm of other specified arteries: Secondary | ICD-10-CM | POA: Diagnosis not present

## 2020-05-23 MED ORDER — TRAMADOL HCL 50 MG PO TABS: 50.0000 mg | ORAL_TABLET | Freq: Four times a day (QID) | ORAL | 0 refills | Status: DC | PRN
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
## 2020-05-23 NOTE — Discharge Instructions (Addendum)
Follow-up with your regular doctor if not improving in 3 to 4 days.  Return emergency department worsening. Take Tylenol and tramadol for pain as needed.  Apply ice to all areas that hurt.
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
## 2020-06-04 DIAGNOSIS — M25562 Pain in left knee: Secondary | ICD-10-CM | POA: Diagnosis not present

## 2020-06-04 DIAGNOSIS — M170 Bilateral primary osteoarthritis of knee: Secondary | ICD-10-CM | POA: Diagnosis not present

## 2020-06-12 ENCOUNTER — Encounter: Payer: Medicare HMO | Attending: Oncology

## 2020-06-12 DIAGNOSIS — C3490 Malignant neoplasm of unspecified part of unspecified bronchus or lung: Secondary | ICD-10-CM | POA: Insufficient documentation

## 2020-06-13 DIAGNOSIS — J019 Acute sinusitis, unspecified: Secondary | ICD-10-CM | POA: Diagnosis not present

## 2020-06-16 ENCOUNTER — Emergency Department
Admission: EM | Admit: 2020-06-16 | Discharge: 2020-06-16 | Disposition: A | Discharge status: Home / Self Care | Payer: Medicare HMO | Attending: Emergency Medicine | Admitting: Emergency Medicine

## 2020-06-16 ENCOUNTER — Emergency Department: Payer: Medicare HMO

## 2020-06-16 ENCOUNTER — Encounter: Payer: Self-pay | Admitting: Emergency Medicine

## 2020-06-16 ENCOUNTER — Other Ambulatory Visit: Payer: Self-pay

## 2020-06-16 DIAGNOSIS — G44201 Tension-type headache, unspecified, intractable: Secondary | ICD-10-CM | POA: Insufficient documentation

## 2020-06-16 DIAGNOSIS — Z79899 Other long term (current) drug therapy: Secondary | ICD-10-CM | POA: Insufficient documentation

## 2020-06-16 DIAGNOSIS — Z923 Personal history of irradiation: Secondary | ICD-10-CM | POA: Diagnosis not present

## 2020-06-16 DIAGNOSIS — R519 Headache, unspecified: Secondary | ICD-10-CM | POA: Diagnosis not present

## 2020-06-16 DIAGNOSIS — M542 Cervicalgia: Secondary | ICD-10-CM | POA: Insufficient documentation

## 2020-06-16 DIAGNOSIS — E039 Hypothyroidism, unspecified: Secondary | ICD-10-CM | POA: Insufficient documentation

## 2020-06-16 DIAGNOSIS — Z85118 Personal history of other malignant neoplasm of bronchus and lung: Secondary | ICD-10-CM | POA: Diagnosis not present

## 2020-06-16 MED ORDER — METHOCARBAMOL 500 MG PO TABS
1000.0000 mg | ORAL_TABLET | Freq: Once | ORAL | Status: AC
  Administered 2020-06-16: 1000 mg via ORAL
  Filled 2020-06-16: qty 2

## 2020-06-16 MED ORDER — PROMETHAZINE HCL 25 MG PO TABS
12.5000 mg | ORAL_TABLET | Freq: Once | ORAL | Status: AC
  Administered 2020-06-16: 12.5 mg via ORAL
  Filled 2020-06-16: qty 1

## 2020-06-16 MED ORDER — PREDNISONE 20 MG PO TABS
60.0000 mg | ORAL_TABLET | Freq: Once | ORAL | Status: AC
  Administered 2020-06-16: 60 mg via ORAL
  Filled 2020-06-16: qty 3

## 2020-06-16 MED ORDER — BUTALBITAL-APAP-CAFFEINE 50-325-40 MG PO TABS: 1.0000 | ORAL_TABLET | Freq: Four times a day (QID) | ORAL | 0 refills | Status: AC | PRN

## 2020-06-16 MED ORDER — METHOCARBAMOL 500 MG PO TABS: 500.0000 mg | ORAL_TABLET | Freq: Four times a day (QID) | ORAL | 0 refills | Status: DC

## 2020-06-16 MED ORDER — PREDNISONE 50 MG PO TABS: 50.0000 mg | ORAL_TABLET | Freq: Every day | ORAL | 0 refills | Status: DC
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
## 2020-06-16 NOTE — ED Notes (Signed)
Follow up pcp info provided all questions answered
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
## 2020-06-25 DIAGNOSIS — M4802 Spinal stenosis, cervical region: Secondary | ICD-10-CM | POA: Diagnosis not present

## 2020-06-25 DIAGNOSIS — R519 Headache, unspecified: Secondary | ICD-10-CM | POA: Diagnosis not present

## 2020-06-26 ENCOUNTER — Telehealth: Payer: Self-pay

## 2020-06-26 NOTE — Telephone Encounter (Signed)
Patient called to CARE program, has been seen for neck pain and headaches. She is due for PT for her neck starting next Monday. Patient will plan to finish PT for about a month and will call back to keep [place] updated how she is doing. May need clearance to come back to verify she is stable to continue CARE. Patient agreed with plan.

## 2020-06-30 DIAGNOSIS — M6281 Muscle weakness (generalized): Secondary | ICD-10-CM | POA: Diagnosis not present

## 2020-06-30 DIAGNOSIS — M436 Torticollis: Secondary | ICD-10-CM | POA: Diagnosis not present

## 2020-06-30 DIAGNOSIS — M542 Cervicalgia: Secondary | ICD-10-CM | POA: Diagnosis not present

## 2020-06-30 DIAGNOSIS — M4802 Spinal stenosis, cervical region: Secondary | ICD-10-CM | POA: Diagnosis not present

## 2020-07-02 DIAGNOSIS — M4802 Spinal stenosis, cervical region: Secondary | ICD-10-CM | POA: Diagnosis not present

## 2020-07-07 DIAGNOSIS — M4802 Spinal stenosis, cervical region: Secondary | ICD-10-CM | POA: Diagnosis not present

## 2020-07-10 DIAGNOSIS — M4802 Spinal stenosis, cervical region: Secondary | ICD-10-CM | POA: Diagnosis not present

## 2020-07-14 DIAGNOSIS — M4802 Spinal stenosis, cervical region: Secondary | ICD-10-CM | POA: Diagnosis not present

## 2020-07-16 DIAGNOSIS — M4802 Spinal stenosis, cervical region: Secondary | ICD-10-CM | POA: Diagnosis not present

## 2020-07-21 DIAGNOSIS — M4802 Spinal stenosis, cervical region: Secondary | ICD-10-CM | POA: Diagnosis not present

## 2020-07-24 DIAGNOSIS — M4802 Spinal stenosis, cervical region: Secondary | ICD-10-CM | POA: Diagnosis not present

## 2020-07-25 ENCOUNTER — Other Ambulatory Visit: Payer: Self-pay

## 2020-07-25 ENCOUNTER — Inpatient Hospital Stay: Payer: Medicare HMO | Attending: Oncology

## 2020-07-25 ENCOUNTER — Ambulatory Visit
Admission: RE | Admit: 2020-07-25 | Discharge: 2020-07-25 | Disposition: A | Discharge status: Home / Self Care | Payer: Medicare HMO | Source: Ambulatory Visit | Attending: Oncology | Admitting: Oncology

## 2020-07-25 DIAGNOSIS — C3490 Malignant neoplasm of unspecified part of unspecified bronchus or lung: Secondary | ICD-10-CM

## 2020-07-25 DIAGNOSIS — C3411 Malignant neoplasm of upper lobe, right bronchus or lung: Secondary | ICD-10-CM | POA: Diagnosis not present

## 2020-07-25 DIAGNOSIS — J984 Other disorders of lung: Secondary | ICD-10-CM | POA: Diagnosis not present

## 2020-07-25 DIAGNOSIS — Z08 Encounter for follow-up examination after completed treatment for malignant neoplasm: Secondary | ICD-10-CM | POA: Diagnosis not present

## 2020-07-25 DIAGNOSIS — Z85118 Personal history of other malignant neoplasm of bronchus and lung: Secondary | ICD-10-CM | POA: Diagnosis not present

## 2020-07-25 DIAGNOSIS — I728 Aneurysm of other specified arteries: Secondary | ICD-10-CM | POA: Diagnosis not present

## 2020-07-25 DIAGNOSIS — E039 Hypothyroidism, unspecified: Secondary | ICD-10-CM | POA: Diagnosis not present

## 2020-07-25 DIAGNOSIS — I2510 Atherosclerotic heart disease of native coronary artery without angina pectoris: Secondary | ICD-10-CM | POA: Diagnosis not present

## 2020-07-25 LAB — COMPREHENSIVE METABOLIC PANEL
ALT: 15 U/L (ref 0–44)
AST: 22 U/L (ref 15–41)
Albumin: 3.9 g/dL (ref 3.5–5.0)
Alkaline Phosphatase: 114 U/L (ref 38–126)
Anion gap: 12 (ref 5–15)
BUN: 21 mg/dL (ref 8–23)
CO2: 23 mmol/L (ref 22–32)
Calcium: 8.7 mg/dL — ABNORMAL LOW (ref 8.9–10.3)
Chloride: 96 mmol/L — ABNORMAL LOW (ref 98–111)
Creatinine, Ser: 0.63 mg/dL (ref 0.44–1.00)
GFR, Estimated: >60 mL/min (ref >60)
Glucose, Bld: 95 mg/dL (ref 70–99)
Potassium: 4.5 mmol/L (ref 3.5–5.1)
Sodium: 131 mmol/L — ABNORMAL LOW (ref 135–145)
Total Bilirubin: 0.7 mg/dL (ref 0.3–1.2)
Total Protein: 6.5 g/dL (ref 6.5–8.1)

## 2020-07-25 LAB — CBC WITH DIFFERENTIAL/PLATELET
Abs Immature Granulocytes: 0.02 10*3/uL (ref 0.00–0.07)
Basophils Absolute: 0 10*3/uL (ref 0.0–0.1)
Basophils Relative: 0 %
Eosinophils Absolute: 0.1 10*3/uL (ref 0.0–0.5)
Eosinophils Relative: 1 %
HCT: 39.8 % (ref 36.0–46.0)
Hemoglobin: 13.7 g/dL (ref 12.0–15.0)
Immature Granulocytes: 0 %
Lymphocytes Relative: 16 %
Lymphs Abs: 0.8 10*3/uL (ref 0.7–4.0)
MCH: 30.6 pg (ref 26.0–34.0)
MCHC: 34.4 g/dL (ref 30.0–36.0)
MCV: 88.8 fL (ref 80.0–100.0)
Monocytes Absolute: 0.5 10*3/uL (ref 0.1–1.0)
Monocytes Relative: 9 %
Neutro Abs: 3.7 10*3/uL (ref 1.7–7.7)
Neutrophils Relative %: 74 %
Platelets: 219 10*3/uL (ref 150–400)
RBC: 4.48 MIL/uL (ref 3.87–5.11)
RDW: 14 % (ref 11.5–15.5)
WBC: 5.1 10*3/uL (ref 4.0–10.5)
nRBC: 0 % (ref 0.0–0.2)

## 2020-07-28 DIAGNOSIS — M4802 Spinal stenosis, cervical region: Secondary | ICD-10-CM | POA: Diagnosis not present

## 2020-07-29 ENCOUNTER — Encounter: Payer: Medicare HMO | Attending: Oncology | Admitting: *Deleted

## 2020-07-29 ENCOUNTER — Encounter: Payer: Self-pay | Admitting: Oncology

## 2020-07-29 ENCOUNTER — Inpatient Hospital Stay (HOSPITAL_BASED_OUTPATIENT_CLINIC_OR_DEPARTMENT_OTHER): Payer: Medicare HMO | Admitting: Oncology

## 2020-07-29 ENCOUNTER — Other Ambulatory Visit: Payer: Self-pay

## 2020-07-29 VITALS — BP 131/75 | HR 75 | Temp 97.1°F | Resp 20 | Wt 179.1 lb

## 2020-07-29 DIAGNOSIS — Z85118 Personal history of other malignant neoplasm of bronchus and lung: Secondary | ICD-10-CM

## 2020-07-29 DIAGNOSIS — E039 Hypothyroidism, unspecified: Secondary | ICD-10-CM | POA: Diagnosis not present

## 2020-07-29 DIAGNOSIS — C3411 Malignant neoplasm of upper lobe, right bronchus or lung: Secondary | ICD-10-CM | POA: Diagnosis not present

## 2020-07-29 DIAGNOSIS — Z08 Encounter for follow-up examination after completed treatment for malignant neoplasm: Secondary | ICD-10-CM

## 2020-07-29 DIAGNOSIS — C3490 Malignant neoplasm of unspecified part of unspecified bronchus or lung: Secondary | ICD-10-CM | POA: Insufficient documentation

## 2020-07-29 NOTE — Progress Notes (Signed)
Hematology/Oncology Consult note [facility]  [phone] [phone]  Patient Care Team: Idelle Crouch, MD as PCP - General (Internal Medicine) Noreene Filbert, MD as Referring Physician (Radiation Oncology) Sindy Guadeloupe, MD as Consulting Physician (Oncology) Erby Pian, MD as Referring Physician (Specialist) Telford Nab, RN as Registered Nurse   Name of the patient: Lindsey Thomas  [id number]  29-Oct-1940   Date of visit: 07/29/20  Diagnosis-history of lung cancer s/p SBRT  Chief complaint/ Reason for visit-routine follow-up of lung cancer  Heme/Onc history: patient is a 80 year old female with a past medical history significant for COPD hypertension hyperlipidemia and hypothyroidism among other medical problems. She presented with symptoms of shortness of breath which prompted a CT scan.CT chest on 04/17/2019 showed a spiculated peripheral right upper lobe lung lesion measuring 18 x 13 mm concerning for primary lung cancer. No evidence of mediastinal or hilar adenopathy. This was followed by a PET CT scan which again showed uptake with an SUV of 3.1 in the area of the right upper lobe. Smaller subcentimeter pulmonary nodules with no significant metabolic uptake. No metabolic adenopathy. Patient was seen by Dr. Raul Del for possible bronchoscopy. Patient tells me that Dr. Raul Del also got in touch with radiology who thought that CT-guided lung biopsy did carry a risk of pneumothorax and lung collapse.biopsy was not possible safely and she underwent SBRT to RUL without biopsy  Interval history-patient reports baseline exertional shortness of breath which has remained unchanged.  Overall she is doing well and reports no recent changes in appetite.  Denies any cough  ECOG PS- 1 Pain scale- 0   Review of systems- Review of Systems  Constitutional: Positive for malaise/fatigue. Negative for chills, fever and weight loss.   HENT: Negative for congestion, ear discharge and nosebleeds.   Eyes: Negative for blurred vision.  Respiratory: Positive for shortness of breath. Negative for cough, hemoptysis, sputum production and wheezing.   Cardiovascular: Negative for chest pain, palpitations, orthopnea and claudication.  Gastrointestinal: Negative for abdominal pain, blood in stool, constipation, diarrhea, heartburn, melena, nausea and vomiting.  Genitourinary: Negative for dysuria, flank pain, frequency, hematuria and urgency.  Musculoskeletal: Negative for back pain, joint pain and myalgias.  Skin: Negative for rash.  Neurological: Negative for dizziness, tingling, focal weakness, seizures, weakness and headaches.  Endo/Heme/Allergies: Does not bruise/bleed easily.  Psychiatric/Behavioral: Negative for depression and suicidal ideas. The patient does not have insomnia.       Allergies  Allergen Reactions  . Augmentin [Amoxicillin-Pot Clavulanate]   . Celecoxib   . Eryc [Erythromycin]   . Percocet [Oxycodone-Acetaminophen]      Past Medical History:  Diagnosis Date  . Anxiety   . Arthritis   . Asthma   . Edema    feet  . Emphysema of lung ([place])   . GERD (gastroesophageal reflux disease)   . Headache    MIGRAINES  . History of orthopnea   . Hypercholesterolemia   . Hypothyroidism    NODULES  . Lymphedema   . Migraine   . 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 Shortness of breath dyspnea    WITH EXERTION  . Sleep apnea    MILD, DOES NOT USE CPAP  . Thyroid nodule      Past Surgical History:  Procedure Laterality Date  . ABDOMINAL HYSTERECTOMY    . BREAST BIOPSY    . CARDIAC CATHETERIZATION    . CATARACT EXTRACTION W/PHACO Left  02/12/2016   Procedure: CATARACT EXTRACTION PHACO AND INTRAOCULAR LENS PLACEMENT (IOC);  Surgeon: Eulogio Bear, MD;  Location: [facility] ORS;  Service: Ophthalmology;  Laterality: Left;  [place] 01:30 AP% 15.2 CDE 13.71 Fluid pack lot # [id number]   . CATARACT EXTRACTION W/PHACO Right 03/18/2016   Procedure: CATARACT EXTRACTION PHACO AND INTRAOCULAR LENS PLACEMENT ([place]);  Surgeon: Eulogio Bear, MD;  Location: [facility] ORS;  Service: Ophthalmology;  Laterality: Right;  Lot # [id number] H [place]: 01:05.5 AP%:14.3 CDE: 9.33  . FRACTURE SURGERY Left    arm rod and screw  . KNEE ARTHROSCOPY    . OOPHORECTOMY    . RCR    . ROTATOR CUFF REPAIR Left 2006    Social History   Socioeconomic History  . Marital status: Married    Spouse name: Not on file  . Number of children: Not on file  . Years of education: Not on file  . Highest education level: Not on file  Occupational History  . Not on file  Tobacco Use  . Smoking status: Never Smoker  . Smokeless tobacco: Never Used  Substance and Sexual Activity  . Alcohol use: No  . Drug use: Never  . Sexual activity: Not on file  Other Topics Concern  . Not on file  Social History Narrative  . Not on file   Social Determinants of Health   Financial Resource Strain: Not on file  Food Insecurity: Not on file  Transportation Needs: Not on file  Physical Activity: Not on file  Stress: Not on file  Social Connections: Not on file  Intimate Partner Violence: Not on file    Family History  Problem Relation Age of Onset  . Emphysema Mother   . Heart Problems Mother   . Tuberculosis Father   . Brain cancer Father   . Skin cancer Father   . Breast cancer Sister   . Irritable bowel syndrome Sister   . Lung cancer Brother   . Dementia Sister   . Schizophrenia Sister      Current Outpatient Medications:  .  aspirin EC 81 MG tablet, Take 81 mg by mouth daily., Disp: , Rfl:  .  butalbital-acetaminophen-caffeine (FIORICET) 50-325-40 MG tablet, Take 1-2 tablets by mouth every 6 (six) hours as needed for headache., Disp: 12 tablet, Rfl: 0 .  cetirizine (ZYRTEC) 10 MG tablet, Take 10 mg by mouth at bedtime. , Disp: , Rfl:  .  Cholecalciferol (VITAMIN D PO), Take 5,000 Units by mouth  daily., Disp: , Rfl:  .  Cyanocobalamin (VITAMIN B-12 PO), Take 2 tablets by mouth daily., Disp: , Rfl:  .  fluticasone (FLONASE) 50 MCG/ACT nasal spray, Place 2 sprays into both nostrils daily., Disp: , Rfl:  .  loratadine (CLARITIN) 10 MG tablet, Take 10 mg by mouth daily., Disp: , Rfl:  .  methocarbamol (ROBAXIN) 500 MG tablet, Take 1 tablet (500 mg total) by mouth 4 (four) times daily., Disp: 16 tablet, Rfl: 0 .  montelukast (SINGULAIR) 10 MG tablet, Take 10 mg by mouth at bedtime. , Disp: , Rfl:  .  Multiple Vitamins-Minerals (PRESERVISION AREDS PO), Take 1 capsule by mouth 2 (two) times daily., Disp: , Rfl:  .  omeprazole (PRILOSEC) 20 MG capsule, Take 20 mg by mouth 2 (two) times daily before a meal., Disp: , Rfl:  .  predniSONE (DELTASONE) 50 MG tablet, Take 1 tablet (50 mg total) by mouth daily with breakfast., Disp: 5 tablet, Rfl: 0 .  traMADol (ULTRAM) 50 MG tablet,  Take 1 tablet (50 mg total) by mouth every 6 (six) hours as needed., Disp: 15 tablet, Rfl: 0 .  TRELEGY ELLIPTA 100-62.5-25 MCG/INH AEPB, Inhale 1 puff into the lungs daily., Disp: , Rfl:  .  Turmeric 500 MG TABS, Take 1 tablet by mouth 1 day or 1 dose., Disp: , Rfl:  .  VENTOLIN HFA 108 (90 Base) MCG/ACT inhaler, Inhale 2 puffs into the lungs as needed., Disp: , Rfl:  .  furosemide (LASIX) 20 MG tablet, Take by mouth. , Disp: , Rfl:  .  ibandronate (BONIVA) 150 MG tablet, 150 mg.  (Patient not taking: Reported on 07/29/2020), Disp: , Rfl:   Physical exam:  Vitals:   07/29/20 1042  BP: 131/75  Pulse: 75  Resp: 20  Temp: (!) 97.1 F (36.2 C)  TempSrc: Tympanic  SpO2: 99%  Weight: 179 lb 1.6 oz (81.2 kg)   Physical Exam Constitutional:      General: She is not in acute distress. Eyes:     Extraocular Movements: EOM normal.  Cardiovascular:     Rate and Rhythm: Normal rate and regular rhythm.     Heart sounds: Normal heart sounds.  Pulmonary:     Effort: Pulmonary effort is normal.     Breath sounds: Normal  breath sounds.  Abdominal:     General: Bowel sounds are normal.     Palpations: Abdomen is soft.  Skin:    General: Skin is warm and dry.  Neurological:     Mental Status: She is alert and oriented to person, place, and time.      CMP Latest Ref Rng & Units 07/25/2020  Glucose 70 - 99 mg/dL 95  BUN 8 - 23 mg/dL 21  Creatinine 0.44 - 1.00 mg/dL 0.63  Sodium 135 - 145 mmol/L 131(L)  Potassium 3.5 - 5.1 mmol/L 4.5  Chloride 98 - 111 mmol/L 96(L)  CO2 22 - 32 mmol/L 23  Calcium 8.9 - 10.3 mg/dL 8.7(L)  Total Protein 6.5 - 8.1 g/dL 6.5  Total Bilirubin 0.3 - 1.2 mg/dL 0.7  Alkaline Phos 38 - 126 U/L 114  AST 15 - 41 U/L 22  ALT 0 - 44 U/L 15   CBC Latest Ref Rng & Units 07/25/2020  WBC 4.0 - 10.5 K/uL 5.1  Hemoglobin 12.0 - 15.0 g/dL 13.7  Hematocrit 36.0 - 46.0 % 39.8  Platelets 150 - 400 K/uL 219    No images are attached to the encounter.  CT Chest Wo Contrast  Result Date: 07/25/2020 CLINICAL DATA:  Non-small cell lung cancer, radiation completed February 2021. Restaging assessment. EXAM: CT CHEST WITHOUT CONTRAST TECHNIQUE: Multidetector CT imaging of the chest was performed following the standard protocol without IV contrast. COMPARISON:  05/23/2020 FINDINGS: Cardiovascular: Coronary, aortic arch, and branch vessel atherosclerotic vascular disease. Mediastinum/Nodes: Hypodense 2.3 by 1.8 cm left thyroid nodule appears stable. As best I can tell this is not been worked up with ultrasound. Recommend thyroid US (ref: J Am Coll Radiol. 2015 Feb;12(2): 143-50). No pathologic adenopathy identified. Lungs/Pleura: Stable right upper lobe scarring along the minor fissure. Scattered small nodules in the 2-4 mm range bilaterally are unchanged. 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 Adrenal glands normal. Musculoskeletal: Thoracic spondylosis. The previous right upper breast contusion has resolved. IMPRESSION: 1. No specific  findings of active malignancy. Small bilateral nodules in the 2-4 mm range are unchanged. 2. Stable appearance of the hypodense left thyroid nodule. Recommend thyroid  US (ref: J Am Coll Radiol. 2015 Feb;12(2): 143-50). 3. Coronary, aortic arch, and branch vessel atherosclerotic vascular disease. 4. 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 5. Stable right upper lobe scarring along the minor fissure. 6. Aortic atherosclerosis. Aortic Atherosclerosis (ICD10-I70.0). Electronically Signed   By: Van Clines M.D.   On: 07/25/2020 12:58     Assessment and plan- Patient is a 80 y.o. female   with right upper lobe hypermetabolic nodule presumptively treated of stage I lung cancer s/p SBRT.  This is a routine follow-up visit of lung cancer  Recent CT scanning From February 2022 revealed no evidence of recurrent malignancy.  Nonspecific pulmonary nodules 2 to 4 mm in size overall stable.  Stable size of left thyroid nodule.  Patient reports she has had an ultrasound of the thyroid nodule in the past and does not desire any work-up at this time.  Continue to monitor and I will see her back in 6 months with a repeat CT chest without contrast   Visit Diagnosis 1. Encounter for follow-up surveillance of lung cancer      Dr. Randa Evens, MD, MPH [facility] at [facility] [phone] 07/29/2020 2:30 PM

## 2020-07-29 NOTE — Progress Notes (Signed)
Daily Session Note  Patient Details  Name: Lindsey Thomas MRN: [id number] Date of Birth: Oct 10, 1940 Referring Provider:   April Manson Pulmonary Rehab from 02/21/2020 in [facility] Cardiac and Pulmonary Rehab  Referring Provider [name], NP      Encounter Date: 07/29/2020  Check In:  Session Check In - 07/29/20 1238      Check-In   Supervising physician immediately available to respond to emergencies See telemetry face sheet for immediately available ER MD    Location [facility]-Cardiac & Pulmonary Rehab    Staff Present Alberteen Sam, MA, RCEP, CCRP, Marylynn Pearson, MS Exercise Physiologist    Virtual Visit No    Medication changes reported     No    Fall or balance concerns reported    No    Warm-up and Cool-down Performed on first and last piece of equipment    Resistance Training Performed Yes    VAD Patient? No    PAD/SET Patient? No      Pain Assessment   Currently in Pain? No/denies              Social History   Tobacco Use  Smoking Status Never Smoker  Smokeless Tobacco Never Used    Goals Met:  Proper associated with RPD/PD & O2 Sat Independence with exercise equipment Exercise tolerated well Strength training completed today  Goals Unmet:  Not Applicable  Comments: Pt able to follow exercise prescription today without complaint.  Will continue to monitor for progression.  Returned today   Dr. Emily Filbert is Medical Director for [place] and [facility].

## 2020-08-04 DIAGNOSIS — M4802 Spinal stenosis, cervical region: Secondary | ICD-10-CM | POA: Diagnosis not present

## 2020-08-05 ENCOUNTER — Encounter: Payer: Medicare HMO | Attending: Oncology | Admitting: *Deleted

## 2020-08-05 ENCOUNTER — Other Ambulatory Visit: Payer: Self-pay

## 2020-08-05 DIAGNOSIS — C3490 Malignant neoplasm of unspecified part of unspecified bronchus or lung: Secondary | ICD-10-CM | POA: Insufficient documentation

## 2020-08-05 NOTE — Progress Notes (Signed)
Daily Session Note  Patient Details  Name: EBONEE STOBER MRN: [id number] Date of Birth: 1940/10/19 Referring Provider:   April Manson Pulmonary Rehab from 02/21/2020 in [facility] Cardiac and Pulmonary Rehab  Referring Provider [name], NP      Encounter Date: 08/05/2020  Check In:  Session Check In - 08/05/20 1259      Check-In   Supervising physician immediately available to respond to emergencies See telemetry face sheet for immediately available ER MD    Staff Present Alberteen Sam, MA, RCEP, CCRP, Marylynn Pearson, MS Exercise Physiologist    Virtual Visit No    Medication changes reported     No    Fall or balance concerns reported    No    Warm-up and Cool-down Performed on first and last piece of equipment    Resistance Training Performed Yes    VAD Patient? No    PAD/SET Patient? No      Pain Assessment   Currently in Pain? No/denies              Social History   Tobacco Use  Smoking Status Never Smoker  Smokeless Tobacco Never Used    Goals Met:  Proper associated with RPD/PD & O2 Sat Independence with exercise equipment Exercise tolerated well No report of cardiac concerns or symptoms Strength training completed today  Goals Unmet:  Not Applicable  Comments: Pt able to follow exercise prescription today without complaint.  Will continue to monitor for progression.    Dr. Emily Filbert is Medical Director for [place] and [facility].

## 2020-08-07 ENCOUNTER — Other Ambulatory Visit: Payer: Self-pay

## 2020-08-07 DIAGNOSIS — M4802 Spinal stenosis, cervical region: Secondary | ICD-10-CM | POA: Diagnosis not present

## 2020-08-07 DIAGNOSIS — C3490 Malignant neoplasm of unspecified part of unspecified bronchus or lung: Secondary | ICD-10-CM

## 2020-08-07 NOTE — Progress Notes (Signed)
Daily Session Note  Patient Details  Name: Lindsey Thomas MRN: [id number] Date of Birth: 11/11/1940 Referring Provider:   Flowsheet Row Pulmonary Rehab from 02/21/2020 in [facility] Cardiac and Pulmonary Rehab  Referring Provider Jennifer Burns, NP      Encounter Date: 08/07/2020  Check In:  Session Check In - 08/07/20 1354      Check-In   Supervising physician immediately available to respond to emergencies See telemetry face sheet for immediately available ER MD    Location [facility]-Cardiac & Pulmonary Rehab    Staff Present Amanda Sommer, BA, ACSM CEP, Exercise Physiologist;Kara Langdon, MS Exercise Physiologist    Virtual Visit No    Medication changes reported     No    Fall or balance concerns reported    No    Warm-up and Cool-down Performed on first and last piece of equipment    Resistance Training Performed Yes    VAD Patient? No    PAD/SET Patient? No              Social History   Tobacco Use  Smoking Status Never Smoker  Smokeless Tobacco Never Used    Goals Met:  Independence with exercise equipment Exercise tolerated well No report of cardiac concerns or symptoms Strength training completed today  Goals Unmet:  Not Applicable  Comments: Pt able to follow exercise prescription today without complaint.  Will continue to monitor for progression.   Dr. Mark Miller is Medical Director for [facility] and [facility]. 

## 2020-08-08 DIAGNOSIS — Z0000 Encounter for general adult medical examination without abnormal findings: Secondary | ICD-10-CM | POA: Diagnosis not present

## 2020-08-08 DIAGNOSIS — I890 Lymphedema, not elsewhere classified: Secondary | ICD-10-CM | POA: Diagnosis not present

## 2020-08-08 DIAGNOSIS — Z79899 Other long term (current) drug therapy: Secondary | ICD-10-CM | POA: Diagnosis not present

## 2020-08-08 DIAGNOSIS — E785 Hyperlipidemia, unspecified: Secondary | ICD-10-CM | POA: Diagnosis not present

## 2020-08-12 ENCOUNTER — Other Ambulatory Visit: Payer: Self-pay

## 2020-08-12 DIAGNOSIS — G4733 Obstructive sleep apnea (adult) (pediatric): Secondary | ICD-10-CM | POA: Diagnosis not present

## 2020-08-12 DIAGNOSIS — C3490 Malignant neoplasm of unspecified part of unspecified bronchus or lung: Secondary | ICD-10-CM

## 2020-08-12 NOTE — Progress Notes (Signed)
Daily Session Note  Patient Details  Name: DOMINIGUE GELLNER MRN: [id number] Date of Birth: [birth year] Referring Provider:   April Manson Pulmonary Rehab from 02/21/2020 in [facility] Cardiac and Pulmonary Rehab  Referring Provider [name], NP      Encounter Date: 08/12/2020  Check In:  Session Check In - 08/12/20 1243      Check-In   Supervising physician immediately available to respond to emergencies See telemetry face sheet for immediately available ER MD    Location [facility]-Cardiac & Pulmonary Rehab    Staff Present Birdie Sons, MPA, RN;Jessica Luan Pulling, MA, RCEP, CCRP, Marylynn Pearson, MS Exercise Physiologist    Virtual Visit No    Medication changes reported     No    Fall or balance concerns reported    No    Tobacco Cessation No Change    Warm-up and Cool-down Performed on first and last piece of equipment    Resistance Training Performed Yes    VAD Patient? No    PAD/SET Patient? No      Pain Assessment   Currently in Pain? No/denies              Social History   Tobacco Use  Smoking Status Never Smoker  Smokeless Tobacco Never Used    Goals Met:  Independence with exercise equipment Exercise tolerated well No report of cardiac concerns or symptoms Strength training completed today  Goals Unmet:  Not Applicable  Comments: Pt able to follow exercise prescription today without complaint.  Will continue to monitor for progression.    Dr. Emily Filbert is Medical Director for [place] and [facility].

## 2020-08-13 DIAGNOSIS — M4802 Spinal stenosis, cervical region: Secondary | ICD-10-CM | POA: Diagnosis not present

## 2020-08-15 DIAGNOSIS — B351 Tinea unguium: Secondary | ICD-10-CM | POA: Diagnosis not present

## 2020-08-15 DIAGNOSIS — M79674 Pain in right toe(s): Secondary | ICD-10-CM | POA: Diagnosis not present

## 2020-08-15 DIAGNOSIS — M79675 Pain in left toe(s): Secondary | ICD-10-CM | POA: Diagnosis not present

## 2020-08-15 DIAGNOSIS — M778 Other enthesopathies, not elsewhere classified: Secondary | ICD-10-CM | POA: Diagnosis not present

## 2020-08-15 DIAGNOSIS — L600 Ingrowing nail: Secondary | ICD-10-CM | POA: Diagnosis not present

## 2020-08-18 DIAGNOSIS — M4802 Spinal stenosis, cervical region: Secondary | ICD-10-CM | POA: Diagnosis not present

## 2020-08-19 ENCOUNTER — Other Ambulatory Visit: Payer: Self-pay

## 2020-08-19 DIAGNOSIS — E7800 Pure hypercholesterolemia, unspecified: Secondary | ICD-10-CM | POA: Diagnosis not present

## 2020-08-19 DIAGNOSIS — R0600 Dyspnea, unspecified: Secondary | ICD-10-CM | POA: Diagnosis not present

## 2020-08-19 DIAGNOSIS — J4520 Mild intermittent asthma, uncomplicated: Secondary | ICD-10-CM | POA: Diagnosis not present

## 2020-08-19 DIAGNOSIS — C3490 Malignant neoplasm of unspecified part of unspecified bronchus or lung: Secondary | ICD-10-CM

## 2020-08-19 DIAGNOSIS — R4701 Aphasia: Secondary | ICD-10-CM | POA: Diagnosis not present

## 2020-08-19 DIAGNOSIS — I890 Lymphedema, not elsewhere classified: Secondary | ICD-10-CM | POA: Diagnosis not present

## 2020-08-19 DIAGNOSIS — G4733 Obstructive sleep apnea (adult) (pediatric): Secondary | ICD-10-CM | POA: Diagnosis not present

## 2020-08-19 DIAGNOSIS — R918 Other nonspecific abnormal finding of lung field: Secondary | ICD-10-CM | POA: Diagnosis not present

## 2020-08-19 NOTE — Progress Notes (Signed)
Daily Session Note  Patient Details  Name: Lindsey Thomas MRN: [id number] Date of Birth: July 21, 1940 Referring Provider:   April Manson Pulmonary Rehab from 02/21/2020 in [facility] Cardiac and Pulmonary Rehab  Referring Provider [name], NP      Encounter Date: 08/19/2020  Check In:  Session Check In - 08/19/20 1247      Check-In   Supervising physician immediately available to respond to emergencies See telemetry face sheet for immediately available ER MD    Location [facility] Pulmonary Re[facility]    Staff Present Birdie Sons, MPA, RN;Jessica Luan Pulling, MA, RCEP, CCRP, CCET    Virtual Visit No    Medication changes reported     No    Fall or balance concerns reported    No    Tobacco Cessation No Change    Warm-up and Cool-down Performed on first and last piece of equipment    Resistance Training Performed Yes    VAD Patient? No    PAD/SET Patient? No      Pain Assessment   Currently in Pain? No/denies              Social History   Tobacco Use  Smoking Status Never Smoker  Smokeless Tobacco Never Used    Goals Met:  Independence with exercise equipment Exercise tolerated well No report of cardiac concerns or symptoms Strength training completed today  Goals Unmet:  Not Applicable  Comments: Pt able to follow exercise prescription today without complaint.  Will continue to monitor for progression.    Dr. Emily Filbert is Medical Director for [place] and [facility].

## 2020-08-22 DIAGNOSIS — M4802 Spinal stenosis, cervical region: Secondary | ICD-10-CM | POA: Diagnosis not present

## 2020-08-25 DIAGNOSIS — M4802 Spinal stenosis, cervical region: Secondary | ICD-10-CM | POA: Diagnosis not present

## 2020-08-26 ENCOUNTER — Other Ambulatory Visit: Payer: Self-pay

## 2020-08-26 DIAGNOSIS — C3490 Malignant neoplasm of unspecified part of unspecified bronchus or lung: Secondary | ICD-10-CM

## 2020-08-26 NOTE — Progress Notes (Signed)
Daily Session Note  Patient Details  Name: Lindsey Thomas MRN: [id number] Date of Birth: 01/08/1941 Referring Provider:   April Manson Pulmonary Rehab from 02/21/2020 in [facility] Cardiac and Pulmonary Rehab  Referring Provider [name], NP      Encounter Date: 08/26/2020  Check In:  Session Check In - 08/26/20 1246      Check-In   Supervising physician immediately available to respond to emergencies See telemetry face sheet for immediately available ER MD    Location [facility]-Cardiac & Pulmonary Rehab    Staff Present Birdie Sons, MPA, RN;Jessica Luan Pulling, MA, RCEP, CCRP, Marylynn Pearson, MS Exercise Physiologist    Virtual Visit No    Medication changes reported     No    Fall or balance concerns reported    No    Tobacco Cessation No Change    Warm-up and Cool-down Performed on first and last piece of equipment    Resistance Training Performed Yes    VAD Patient? No    PAD/SET Patient? No      Pain Assessment   Currently in Pain? No/denies              Social History   Tobacco Use  Smoking Status Never Smoker  Smokeless Tobacco Never Used    Goals Met:  Independence with exercise equipment Exercise tolerated well No report of cardiac concerns or symptoms Strength training completed today  Goals Unmet:  Not Applicable  Comments: Pt able to follow exercise prescription today without complaint.  Will continue to monitor for progression.    Dr. Emily Filbert is Medical Director for [place] and [facility].

## 2020-08-27 DIAGNOSIS — M4802 Spinal stenosis, cervical region: Secondary | ICD-10-CM | POA: Diagnosis not present

## 2020-08-28 ENCOUNTER — Other Ambulatory Visit: Payer: Self-pay

## 2020-08-28 DIAGNOSIS — C3490 Malignant neoplasm of unspecified part of unspecified bronchus or lung: Secondary | ICD-10-CM

## 2020-08-28 NOTE — Progress Notes (Signed)
Daily Session Note  Patient Details  Name: Lindsey Thomas MRN: [id number] Date of Birth: 1940-11-04 Referring Provider:   April Manson Pulmonary Rehab from 02/21/2020 in [facility] Cardiac and Pulmonary Rehab  Referring Provider [name], NP      Encounter Date: 08/28/2020  Check In:  Session Check In - 08/28/20 1240      Check-In   Supervising physician immediately available to respond to emergencies See telemetry face sheet for immediately available ER MD    Location [facility]-Card[facility] & Pulmonary Rehab    Staff Present Birdie Sons, MPA, Elveria Rising, BA, ACSM CEP, Exercise Physiologist;Kara Eliezer Bottom, MS Exercise Physiologist    Virtual Visit No    Medication changes reported     No    Fall or balance concerns reported    No    Tobacco Cessation No Change    Warm-up and Cool-down Performed on first and last piece of equipment    Resistance Training Performed Yes    VAD Patient? No    PAD/SET Patient? No      Pain Assessment   Currently in Pain? No/denies              Social History   Tobacco Use  Smoking Status Never Smoker  Smokeless Tobacco Never Used    Goals Met:  Yoga completed Independence with exercise equipment Exercise tolerated well No report of cardiac concerns or symptoms Strength training completed today  Goals Unmet:  Not Applicable  Comments: Pt able to follow exercise prescription today without complaint.  Will continue to monitor for progression.    Dr. Emily Filbert is Medical Director for [place] and [facility].

## 2020-09-01 DIAGNOSIS — M4802 Spinal stenosis, cervical region: Secondary | ICD-10-CM | POA: Diagnosis not present

## 2020-09-08 DIAGNOSIS — M4802 Spinal stenosis, cervical region: Secondary | ICD-10-CM | POA: Diagnosis not present

## 2020-09-10 DIAGNOSIS — M4802 Spinal stenosis, cervical region: Secondary | ICD-10-CM | POA: Diagnosis not present

## 2020-09-15 DIAGNOSIS — M4802 Spinal stenosis, cervical region: Secondary | ICD-10-CM | POA: Diagnosis not present

## 2020-09-16 ENCOUNTER — Other Ambulatory Visit: Payer: Self-pay

## 2020-09-16 ENCOUNTER — Encounter: Payer: Medicare HMO | Attending: Oncology

## 2020-09-16 DIAGNOSIS — C3490 Malignant neoplasm of unspecified part of unspecified bronchus or lung: Secondary | ICD-10-CM | POA: Insufficient documentation

## 2020-09-16 NOTE — Progress Notes (Signed)
Daily Session Note  Patient Details  Name: Lindsey Thomas MRN: [id number] Date of Birth: 1940/12/29 Referring Provider:   April Manson Pulmonary Rehab from 02/21/2020 in [facility] Cardiac and Pulmonary Rehab  Referring Provider [name], NP      Encounter Date: 09/16/2020  Check In:  Session Check In - 09/16/20 1238      Check-In   Supervising physician immediately available to respond to emergencies See telemetry face sheet for immediately available ER MD    Location [facility]-Cardiac & Pulmonary Rehab    Staff Present Birdie Sons, MPA, RN;Jessica Derry, MA, RCEP, CCRP, Marylynn Pearson, MS Exercise Physiologist    Virtual Visit No    Medication changes reported     No    Fall or balance concerns reported    No    Tobacco Cessation No Change    Warm-up and Cool-down Performed on first and last piece of equipment    Resistance Training Performed Yes    VAD Patient? No    PAD/SET Patient? No      Pain Assessment   Currently in Pain? No/denies              Social History   Tobacco Use  Smoking Status Never Smoker  Smokeless Tobacco Never Used    Goals Met:  Independence with exercise equipment Exercise tolerated well No report of cardiac concerns or symptoms Strength training completed today  Goals Unmet:  Not Applicable  Comments: Pt able to follow exercise prescription today without complaint.  Will continue to monitor for progression.    Dr. Emily Filbert is Medical Director for [place] and [facility].

## 2020-09-22 DIAGNOSIS — M4802 Spinal stenosis, cervical region: Secondary | ICD-10-CM | POA: Diagnosis not present

## 2020-09-25 ENCOUNTER — Other Ambulatory Visit: Payer: Self-pay

## 2020-09-25 DIAGNOSIS — C3490 Malignant neoplasm of unspecified part of unspecified bronchus or lung: Secondary | ICD-10-CM

## 2020-09-25 NOTE — Progress Notes (Signed)
Daily Session Note  Patient Details  Name: Lindsey Thomas MRN: [id number] Date of Birth: Jul 03, 1940 Referring Provider:   April Thomas Pulmonary Rehab from 02/21/2020 in [facility] Card[facility]monary Rehab  Referring Provider Lindsey Casa, NP      Encounter Date: 09/25/2020  Check In:  Session Check In - 09/25/20 1238      Check-In   Supervising physician immediately available to respond to emergencies See telemetry face sheet for immediately available ER MD    Location [facility]-Cardiac & Pulmonary Rehab    Staff Present Birdie Sons, MPA, Elveria Rising, BA, ACSM CEP, Exercise Physiologist;Kara Eliezer Bottom, MS Exercise Physiologist    Virtual Visit No    Medication changes reported     No    Fall or balance concerns reported    No    Tobacco Cessation No Change    Warm-up and Cool-down Performed on first and last piece of equipment    Resistance Training Performed Yes    VAD Patient? No    PAD/SET Patient? No      Pain Assessment   Currently in Pain? No/denies              Social History   Tobacco Use  Smoking Status Never Smoker  Smokeless Tobacco Never Used    Goals Met:  Independence with exercise equipment Exercise tolerated well No report of cardiac concerns or symptoms Strength training completed today  Goals Unmet:  Not Applicable  Comments: Pt able to follow exercise prescription today without complaint.  Will continue to monitor for progression.    Dr. Emily Filbert is Medical Director for [name] and [facility].

## 2020-09-30 ENCOUNTER — Other Ambulatory Visit: Payer: Self-pay

## 2020-09-30 DIAGNOSIS — C3490 Malignant neoplasm of unspecified part of unspecified bronchus or lung: Secondary | ICD-10-CM

## 2020-09-30 NOTE — Progress Notes (Signed)
Daily Session Note  Patient Details  Name: Lindsey Thomas MRN: [id number] Date of Birth: 01/11/1941 Referring Provider:   Flowsheet Row Pulmonary Rehab from 02/21/2020 in [facility] Card[facility] and Pulmonary Rehab  Referring Provider Jennifer Burns, NP      Encounter Date: 09/30/2020  Check In:  Session Check In - 09/30/20 1243      Check-In   Supervising physician immediately available to respond to emergencies See telemetry face sheet for immediately available ER MD    Location [facility]    Staff Present Kelly Bollinger, MPA, RN;Kara Langdon, MS Exercise Physiologist;Jessica Hawkins, MA, RCEP, CCRP, CCET    Virtual Visit No    Medication changes reported     No    Fall or balance concerns reported    No    Tobacco Cessation No Change    Warm-up and Cool-down Performed on first and last piece of equipment    Resistance Training Performed Yes    VAD Patient? No    PAD/SET Patient? No      Pain Assessment   Currently in Pain? No/denies              Social History   Tobacco Use  Smoking Status Never Smoker  Smokeless Tobacco Never Used    Goals Met:  Independence with exercise equipment Exercise tolerated well Personal goals reviewed No report of cardiac concerns or symptoms Strength training completed today  Went over aerobic exercise education & mindfulness.  Goals Unmet:  Not Applicable  Comments: Pt able to follow exercise prescription today without complaint.  Will continue to monitor for progression.    Dr. Mark Miller is Medical Director for [facility] and [facility]. 

## 2020-10-06 DIAGNOSIS — E042 Nontoxic multinodular goiter: Secondary | ICD-10-CM | POA: Diagnosis not present

## 2020-10-21 ENCOUNTER — Other Ambulatory Visit: Payer: Self-pay

## 2020-10-21 ENCOUNTER — Encounter: Payer: Medicare HMO | Attending: Oncology

## 2020-10-21 DIAGNOSIS — C3490 Malignant neoplasm of unspecified part of unspecified bronchus or lung: Secondary | ICD-10-CM | POA: Insufficient documentation

## 2020-10-21 NOTE — Progress Notes (Signed)
Daily Session Note  Patient Details  Name: Lindsey Thomas MRN: [id number] Date of Birth: [birth year] Referring Provider:   April Thomas Pulmonary Rehab from 02/21/2020 in [facility] Cardiac and Pulmonary Rehab  Referring Provider Lindsey Casa, NP      Encounter Date: 10/21/2020  Check In:  Session Check In - 10/21/20 1232      Check-In   Supervising physician immediately available to respond to emergencies See telemetry face sheet for immediately available ER MD    Location [facility]    Staff Present Lindsey Thomas, MPA, RN;Lindsey Thomas, BA, ACSM CEP, Exercise Physiologist;Lindsey Veneta, MA, RCEP, CCRP, CCET    Virtual Visit No    Medication changes reported     No    Fall or balance concerns reported    No    Tobacco Cessation No Change    Warm-up and Cool-down Performed on first and last piece of equipment    Resistance Training Performed Yes    VAD Patient? No    PAD/SET Patient? No      Pain Assessment   Currently in Pain? No/denies              Social History   Tobacco Use  Smoking Status Never Smoker  Smokeless Tobacco Never Used    Goals Met:  Independence with exercise equipment Exercise tolerated well No report of cardiac concerns or symptoms Strength training completed today  Goals Unmet:  Not Applicable  Comments:  Lindsey Thomas graduated today from  rehab with 24 sessions completed.  Details of the patient's exercise prescription and what She needs to do in order to continue the prescription and progress were discussed with patient.  Patient was given a copy of prescription and goals.  Patient verbalized understanding.  Lindsey Thomas plans to continue to exercise by joining a gym.    Dr. Emily Thomas is Medical Director for [place] and [facility].

## 2020-10-22 DIAGNOSIS — C3490 Malignant neoplasm of unspecified part of unspecified bronchus or lung: Secondary | ICD-10-CM

## 2020-10-22 NOTE — Progress Notes (Addendum)
CARE Discharge Progress Report  Patient Details  Name: Lindsey Thomas MRN: [id number] Date of Birth: 07-23-1940 Referring Provider:   Flowsheet Row Pulmonary Rehab from 02/21/2020 in [facility] Cardiac and Pulmonary Rehab  Referring Provider [name], NP       Number of Visits: 28  Reason for Discharge:  Early Exit:  Personal/ Lack of attendance  Patient started CARE September 2021, had a break in between due to accident and medical issues. Patient OK to graduate early.  Smoking History:  Social History   Tobacco Use  Smoking Status Never Smoker  Smokeless Tobacco Never Used    Diagnosis:  Malignant neoplasm of lung, unspecified laterality, unspecified part of lung [facility])    Discharge Exercise Prescription (Final Exercise Prescription Changes):   Functional Capacity:  6 Minute Walk    Row Name 10/21/20 1245         6 Minute Walk   Phase Initial     Distance 1005 feet     Distance % Change 100 %     Distance Feet Change 11 ft     Walk Time 6 minutes     # of Rest Breaks 0     MPH 1.9     METS 1.13     RPE 15     Perceived Dyspnea  2     VO2 Peak 3.96     Symptoms Yes (comment)     Comments SOB (normal)     Resting HR 77 bpm     Resting BP 104/72     Resting Oxygen Saturation  97 %     Exercise Oxygen Saturation  during 6 min walk 95 %     Max Ex. HR 80 bpm     Max Ex. BP 142/70              Personal Goals: Goals established at orientation with interventions provided to work toward goal.      Goals reviewed with patient; copy given to patient.

## 2020-10-29 DIAGNOSIS — H353132 Nonexudative age-related macular degeneration, bilateral, intermediate dry stage: Secondary | ICD-10-CM | POA: Diagnosis not present

## 2020-10-29 DIAGNOSIS — Z0100 Encounter for examination of eyes and vision without abnormal findings: Secondary | ICD-10-CM | POA: Diagnosis not present

## 2020-11-07 DIAGNOSIS — E7800 Pure hypercholesterolemia, unspecified: Secondary | ICD-10-CM | POA: Diagnosis not present

## 2020-11-07 DIAGNOSIS — E041 Nontoxic single thyroid nodule: Secondary | ICD-10-CM | POA: Diagnosis not present

## 2020-11-07 DIAGNOSIS — Z79899 Other long term (current) drug therapy: Secondary | ICD-10-CM | POA: Diagnosis not present

## 2020-11-12 ENCOUNTER — Other Ambulatory Visit: Payer: Self-pay

## 2020-11-12 ENCOUNTER — Encounter: Payer: Self-pay | Admitting: Radiation Oncology

## 2020-11-12 ENCOUNTER — Ambulatory Visit
Admission: RE | Admit: 2020-11-12 | Discharge: 2020-11-12 | Disposition: A | Discharge status: Home / Self Care | Payer: Medicare HMO | Source: Ambulatory Visit | Attending: Radiation Oncology | Admitting: Radiation Oncology

## 2020-11-12 VITALS — BP 133/65 | HR 80 | Temp 97.2°F | Wt 177.0 lb

## 2020-11-12 DIAGNOSIS — Z923 Personal history of irradiation: Secondary | ICD-10-CM | POA: Insufficient documentation

## 2020-11-12 DIAGNOSIS — Z1231 Encounter for screening mammogram for malignant neoplasm of breast: Secondary | ICD-10-CM | POA: Diagnosis not present

## 2020-11-12 DIAGNOSIS — C3411 Malignant neoplasm of upper lobe, right bronchus or lung: Secondary | ICD-10-CM | POA: Insufficient documentation

## 2020-11-12 DIAGNOSIS — M25511 Pain in right shoulder: Secondary | ICD-10-CM | POA: Diagnosis not present

## 2020-11-12 DIAGNOSIS — J439 Emphysema, unspecified: Secondary | ICD-10-CM | POA: Insufficient documentation

## 2020-11-12 DIAGNOSIS — R911 Solitary pulmonary nodule: Secondary | ICD-10-CM

## 2020-11-12 DIAGNOSIS — Z0000 Encounter for general adult medical examination without abnormal findings: Secondary | ICD-10-CM | POA: Diagnosis not present

## 2020-11-12 DIAGNOSIS — G4733 Obstructive sleep apnea (adult) (pediatric): Secondary | ICD-10-CM | POA: Diagnosis not present

## 2020-11-12 DIAGNOSIS — E7800 Pure hypercholesterolemia, unspecified: Secondary | ICD-10-CM | POA: Diagnosis not present

## 2020-11-12 NOTE — Progress Notes (Signed)
Radiation Oncology Follow up Note  Name: Lindsey Thomas   Date:   11/12/2020 MRN:  [id number] DOB: [birth year]    This 80 y.o. female presents to the clinic today for 62-month follow-up status post SBRT to right upper lobe for stage I non-small cell lung cancer.  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  She specifically [place] cough hemoptysis or chest tightness..  She had a CT scan back in February which I have reviewed showed no findings to suggest active malignancy.  COMPLICATIONS OF TREATMENT: none  FOLLOW UP COMPLIANCE: keeps appointments   PHYSICAL EXAM:  BP 133/65   Pulse 80   Temp (!) 97.2 F (36.2 C) (Tympanic)   Wt 177 lb (80.3 kg)   BMI 38.30 kg/m  Well-developed well-nourished patient in NAD. HEENT reveals PERLA, EOMI, discs not visualized.  Oral cavity is clear. No oral mucosal lesions are identified. Neck is clear without evidence of cervical or supraclavicular adenopathy. Lungs are clear to A&P. Cardiac examination is essentially unremarkable with regular rate and rhythm without murmur rub or thrill. Abdomen is benign with no organomegaly or masses noted. Motor sensory and DTR levels are equal and symmetric in the upper and lower extremities. Cranial nerves II through XII are grossly intact. Proprioception is intact. No peripheral adenopathy or edema is identified. No motor or sensory levels are noted. Crude visual fields are within normal range.  RADIOLOGY RESULTS: CT scan reviewed compatible with above-stated findings  PLAN: Present time patient is doing well stable CT findings after SBRT.  She has a 6 repeat CT scan in August which have asked for her to copy me when she has that performed.  I  have otherwise asked to see her back in 1 year for follow-up.  Patient is to call with any concerns.  I would like to take this opportunity to thank you for allowing me to participate in the care of your patient.Noreene Filbert, MD

## 2020-11-13 ENCOUNTER — Other Ambulatory Visit: Payer: Self-pay | Admitting: Internal Medicine

## 2020-11-13 DIAGNOSIS — Z1231 Encounter for screening mammogram for malignant neoplasm of breast: Secondary | ICD-10-CM

## 2020-11-20 ENCOUNTER — Emergency Department: Payer: Medicare HMO

## 2020-11-20 ENCOUNTER — Emergency Department
Admission: EM | Admit: 2020-11-20 | Discharge: 2020-11-20 | Disposition: A | Discharge status: Home / Self Care | Payer: Medicare HMO | Attending: Emergency Medicine | Admitting: Emergency Medicine

## 2020-11-20 ENCOUNTER — Other Ambulatory Visit: Payer: Self-pay

## 2020-11-20 DIAGNOSIS — S0083XA Contusion of other part of head, initial encounter: Secondary | ICD-10-CM | POA: Insufficient documentation

## 2020-11-20 DIAGNOSIS — M25561 Pain in right knee: Secondary | ICD-10-CM | POA: Diagnosis not present

## 2020-11-20 DIAGNOSIS — M4312 Spondylolisthesis, cervical region: Secondary | ICD-10-CM | POA: Diagnosis not present

## 2020-11-20 DIAGNOSIS — M47812 Spondylosis without myelopathy or radiculopathy, cervical region: Secondary | ICD-10-CM | POA: Diagnosis not present

## 2020-11-20 DIAGNOSIS — E039 Hypothyroidism, unspecified: Secondary | ICD-10-CM | POA: Diagnosis not present

## 2020-11-20 DIAGNOSIS — S0990XA Unspecified injury of head, initial encounter: Secondary | ICD-10-CM | POA: Diagnosis present

## 2020-11-20 DIAGNOSIS — Z043 Encounter for examination and observation following other accident: Secondary | ICD-10-CM | POA: Diagnosis not present

## 2020-11-20 DIAGNOSIS — Z85118 Personal history of other malignant neoplasm of bronchus and lung: Secondary | ICD-10-CM | POA: Insufficient documentation

## 2020-11-20 DIAGNOSIS — Z7982 Long term (current) use of aspirin: Secondary | ICD-10-CM | POA: Diagnosis not present

## 2020-11-20 DIAGNOSIS — S8001XA Contusion of right knee, initial encounter: Secondary | ICD-10-CM | POA: Diagnosis not present

## 2020-11-20 DIAGNOSIS — Z7951 Long term (current) use of inhaled steroids: Secondary | ICD-10-CM | POA: Diagnosis not present

## 2020-11-20 DIAGNOSIS — W010XXA Fall on same level from slipping, tripping and stumbling without subsequent striking against object, initial encounter: Secondary | ICD-10-CM | POA: Diagnosis not present

## 2020-11-20 DIAGNOSIS — S8002XA Contusion of left knee, initial encounter: Secondary | ICD-10-CM | POA: Diagnosis not present

## 2020-11-20 DIAGNOSIS — M25562 Pain in left knee: Secondary | ICD-10-CM | POA: Diagnosis not present

## 2020-11-20 DIAGNOSIS — J45909 Unspecified asthma, uncomplicated: Secondary | ICD-10-CM | POA: Diagnosis not present

## 2020-11-20 DIAGNOSIS — S0003XA Contusion of scalp, initial encounter: Secondary | ICD-10-CM | POA: Diagnosis not present

## 2020-11-20 DIAGNOSIS — W19XXXA Unspecified fall, initial encounter: Secondary | ICD-10-CM

## 2020-11-20 NOTE — ED Provider Notes (Signed)
[facility]EMERGENCY DEPARTMENT  ____________________________________________  Time seen: Approximately 8:59 PM  I have reviewed the triage vital signs and the nursing notes.   HISTORY  Chief Complaint Fall   Historian Patient     HPI Lindsey Thomas is a 80 y.o. female presents to the emergency department after a mechanical fall.  Patient tripped over a rock.  She did hit her head and has bruising noted to forehead.  Patient also had some left knee pain.  Patient has bruising along the anterior aspect of the bilateral knees.  She denies chest pain, chest tightness or abdominal pain.  She has been able to ambulate since fall occurred.   Past Medical History:  Diagnosis Date   Anxiety    Arthritis    Asthma    Edema    feet   Emphysema of lung (HCC)    GERD (gastroesophageal reflux disease)    Headache    MIGRAINES   History of orthopnea    Hypercholesterolemia    Hypothyroidism    NODULES   Lymphedema    Migraine    Non-small cell lung cancer, right ([place]) 05/2019   Rad tx's   Shortness of breath dyspnea    WITH EXERTION   Sleep apnea    MILD, DOES NOT USE CPAP   Thyroid nodule      Immunizations up to date:  Yes.     Past Medical History:  Diagnosis Date   Anxiety    Arthritis    Asthma    Edema    feet   Emphysema of lung ([place])    GERD (gastroesophageal reflux disease)    Headache    MIGRAINES   History of orthopnea    Hypercholesterolemia    Hypothyroidism    NODULES   Lymphedema    Migraine    Non-small cell lung cancer, right ([place]) 05/2019   Rad tx's   Shortness of breath dyspnea    WITH EXERTION   Sleep apnea    MILD, DOES NOT USE CPAP   Thyroid nodule     Patient Active Problem List   Diagnosis Date Noted   Dyspnea and respiratory abnormalities 02/18/2020   Diverticulosis 11/19/2019   Lung cancer ([place]) 12/03/2018   Hypertension 04/26/2017   Varicose veins of leg with pain, 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   Leg swelling 04/23/2016    Lymphedema 04/23/2016   Venous stasis 12/23/2015   Thyroid nodule 06/25/2015   Anxiety 12/31/2013   Aphasia 12/31/2013   Obesity 12/31/2013   Derangement of posterior horn of medial meniscus 09/13/2013   Neck pain 09/13/2013   Pure hypercholesterolemia 09/13/2013   Disorder of bursae and tendons in shoulder region 09/13/2013    Past Surgical History:  Procedure Laterality Date   ABDOMINAL HYSTERECTOMY     BREAST BIOPSY     CARDIAC CATHETERIZATION     CATARACT EXTRACTION W/PHACO Left 02/12/2016   Procedure: CATARACT EXTRACTION PHACO AND INTRAOCULAR LENS PLACEMENT ([place]);  Surgeon: Eulogio Bear, MD;  Location: [facility] ORS;  Service: Ophthalmology;  Laterality: Left;  [place] 01:30 AP% 15.2 CDE 13.71 Fluid pack lot # [id number]   CATARACT EXTRACTION W/PHACO Right 03/18/2016   Procedure: CATARACT EXTRACTION PHACO AND INTRAOCULAR LENS PLACEMENT ([facility]);  Surgeon: Eulogio Bear, MD;  Location: [facility] ORS;  Service: Ophthalmology;  Laterality: Right;  Lot # [id number] H [place]: 01:05.5 AP%:14.3 CDE: 9.33   FRACTURE SURGERY Left    arm rod and screw   KNEE ARTHROSCOPY     OOPHORECTOMY  RCR     ROTATOR CUFF REPAIR Left 2006    Prior to Admission medications   Medication Sig Start Date End Date Taking? Authorizing Provider  aspirin EC 81 MG tablet Take 81 mg by mouth daily.    [provider]  butalbital-acetaminophen-caffeine (FIORICET) [phone] MG tablet Take 1-2 tablets by mouth every 6 (six) hours as needed for headache. 06/16/20 06/16/21  Cuthriell, Charline Bills, PA-C  cetirizine (ZYRTEC) 10 MG tablet Take 10 mg by mouth at bedtime.     [provider]  Cholecalciferol (VITAMIN D PO) Take 5,000 Units by mouth daily.    [provider]  Cyanocobalamin (VITAMIN B-12 PO) Take 2 tablets by mouth daily.    [provider]  fluticasone (FLONASE) 50 MCG/ACT nasal spray Place 2 sprays into both nostrils daily. 07/26/18   [provider]  furosemide (LASIX) 20  MG tablet Take by mouth.  10/10/17 05/15/20  [provider]  ibandronate (BONIVA) 150 MG tablet 150 mg. 04/17/20   [provider]  loratadine (CLARITIN) 10 MG tablet Take 10 mg by mouth daily.    [provider]  methocarbamol (ROBAXIN) 500 MG tablet Take 1 tablet (500 mg total) by mouth 4 (four) times daily. 06/16/20   Cuthriell, Charline Bills, PA-C  montelukast (SINGULAIR) 10 MG tablet Take 10 mg by mouth at bedtime.     [provider]  Multiple Vitamins-Minerals (PRESERVISION AREDS PO) Take 1 capsule by mouth 2 (two) times daily.    [provider]  omeprazole (PRILOSEC) 20 MG capsule Take 20 mg by mouth 2 (two) times daily before a meal.    [provider]  predniSONE (DELTASONE) 50 MG tablet Take 1 tablet (50 mg total) by mouth daily with breakfast. 06/16/20   Cuthriell, Charline Bills, PA-C  traMADol (ULTRAM) 50 MG tablet Take 1 tablet (50 mg total) by mouth every 6 (six) hours as needed. 05/23/20   Caryn Section, Linden Dolin, PA-C  TRELEGY ELLIPTA 100-62.5-25 MCG/INH AEPB Inhale 1 puff into the lungs daily. 11/13/19   [provider]  Turmeric 500 MG TABS Take 1 tablet by mouth 1 day or 1 dose.    [provider]  VENTOLIN HFA 108 (90 Base) MCG/ACT inhaler Inhale 2 puffs into the lungs as needed. 02/16/19   [provider]    Allergies Augmentin [amoxicillin-pot clavulanate], Celecoxib, Eryc [erythromycin], and Percocet [oxycodone-acetaminophen]  Family History  Problem Relation Age of Onset   Emphysema Mother    Heart Problems Mother    Tuberculosis Father    Brain cancer Father    Skin cancer Father    Breast cancer Sister    Irritable bowel syndrome Sister    Lung cancer Brother    Dementia Sister    Schizophrenia Sister     Social History Social History   Tobacco Use   Smoking status: Never   Smokeless tobacco: Never  Substance Use Topics   Alcohol use: No   Drug use: Never     Review of Systems   Constitutional: No fever/chills Eyes:  No discharge ENT: No upper respiratory complaints. Respiratory: no cough. No SOB/ use of accessory muscles to breath Gastrointestinal:   No nausea, no vomiting.  No diarrhea.  No constipation. Musculoskeletal: Patient has left knee pain.  Skin: Negative for rash, abrasions, lacerations, ecchymosis.    ____________________________________________   PHYSICAL EXAM:  VITAL SIGNS: ED Triage Vitals  Enc Vitals Group     BP 11/20/20 1844 137/87  Pulse Rate 11/20/20 1844 91     Resp 11/20/20 1844 18     Temp 11/20/20 1844 98.3 F (36.8 C)     Temp Source 11/20/20 1844 Oral     SpO2 11/20/20 1844 100 %     Weight 11/20/20 1845 167 lb (75.8 kg)     Height 11/20/20 1845 4' 9.5" (1.461 m)     Head Circumference --      Peak Flow --      Pain Score 11/20/20 1847 3     Pain Loc --      Pain Edu? --      Excl. in [place]? --      Constitutional: Alert and oriented. Well appearing and in no acute distress. Eyes: Conjunctivae are normal. PERRL. EOMI. Head: No palpable hematomas. ENT: Cardiovascular: Normal rate, regular rhythm. Normal S1 and S2.  Good peripheral circulation. Respiratory: Normal respiratory effort without tachypnea or retractions. Lungs CTAB. Good air entry to the bases with no decreased or absent breath sounds Gastrointestinal: Bowel sounds x 4 quadrants. Soft and nontender to palpation. No guarding or rigidity. No distention. Musculoskeletal: Full range of motion to all extremities. No obvious deformities noted Neurologic:  Normal for age. No gross focal neurologic deficits are appreciated.  Skin: Patient has bruising to forehead and anterior aspect of bilateral knees. Psychiatric: Mood and affect are normal for age. Speech and behavior are normal.   ____________________________________________   LABS (all labs ordered are listed, but only abnormal results are displayed)  Labs Reviewed - No data to  display ____________________________________________  EKG   ____________________________________________  RADIOLOGY Unk Pinto, personally viewed and evaluated these images (plain radiographs) as part of my medical decision making, as well as reviewing the written report by the radiologist.  CT Head Wo Contrast  Result Date: 11/20/2020 CLINICAL DATA:  Fall with forehead laceration EXAM: CT HEAD WITHOUT CONTRAST CT CERVICAL SPINE WITHOUT CONTRAST TECHNIQUE: Multidetector CT imaging of the head and cervical spine was performed following the standard protocol without intravenous contrast. Multiplanar CT image reconstructions of the cervical spine were also generated. COMPARISON:  CT brain and cervical spine 06/16/2020, 05/23/2020, CT chest 08/12/2011 FINDINGS: CT HEAD FINDINGS Brain: No acute territorial infarction, hemorrhage or intracranial mass. The ventricles are nonenlarged. Vascular: No hyperdense vessels.  Carotid vascular calcification Skull: Normal. Negative for fracture or focal lesion. Sinuses/Orbits: No acute finding. Other: Left forehead scalp hematoma CT CERVICAL SPINE FINDINGS Alignment: Trace anterolisthesis C3 on C4 and C4 on C5. Facet alignment maintained. Skull base and vertebrae: No acute fracture. No primary bone lesion or focal pathologic process. Soft tissues and spinal canal: No prevertebral fluid or swelling. No visible canal hematoma. Disc levels: Moderate disc space narrowing C4-C5, C5-C6 and C6-C7. Facet degenerative changes at multiple levels. Upper chest: Negative. Other: 2.5 cm hypodense nodule in the left lobe of thyroid. Noted to be PET negative 05/09/2019. present on 2013 comparison. IMPRESSION: 1. No CT evidence for acute intracranial abnormality. Moderate left forehead scalp hematoma 2. No acute osseous abnormality of the cervical spine 3. 2.5 cm hypodense nodule left lobe of thyroid. Stability for greater than 5 years implies benignity; no biopsy or followup  indicated (ref: J Am Coll Radiol. 2015 Feb;12(2): 143-50). Electronically Signed   By: Donavan Foil M.D.   On: 11/20/2020 19:30   CT Cervical Spine Wo Contrast  Result Date: 11/20/2020 CLINICAL DATA:  Fall with forehead laceration EXAM: CT HEAD WITHOUT CONTRAST CT CERVICAL SPINE WITHOUT CONTRAST TECHNIQUE: Multidetector  CT imaging of the head and cervical spine was performed following the standard protocol without intravenous contrast. Multiplanar CT image reconstructions of the cervical spine were also generated. COMPARISON:  CT brain and cervical spine 06/16/2020, 05/23/2020, CT chest 08/12/2011 FINDINGS: CT HEAD FINDINGS Brain: No acute territorial infarction, hemorrhage or intracranial mass. The ventricles are nonenlarged. Vascular: No hyperdense vessels.  Carotid vascular calcification Skull: Normal. Negative for fracture or focal lesion. Sinuses/Orbits: No acute finding. Other: Left forehead scalp hematoma CT CERVICAL SPINE FINDINGS Alignment: Trace anterolisthesis C3 on C4 and C4 on C5. Facet alignment maintained. Skull base and vertebrae: No acute fracture. No primary bone lesion or focal pathologic process. Soft tissues and spinal canal: No prevertebral fluid or swelling. No visible canal hematoma. Disc levels: Moderate disc space narrowing C4-C5, C5-C6 and C6-C7. Facet degenerative changes at multiple levels. Upper chest: Negative. Other: 2.5 cm hypodense nodule in the left lobe of thyroid. Noted to be PET negative 05/09/2019. present on 2013 comparison. IMPRESSION: 1. No CT evidence for acute intracranial abnormality. Moderate left forehead scalp hematoma 2. No acute osseous abnormality of the cervical spine 3. 2.5 cm hypodense nodule left lobe of thyroid. Stability for greater than 5 years implies benignity; no biopsy or followup indicated (ref: J Am Coll Radiol. 2015 Feb;12(2): 143-50). Electronically Signed   By: Donavan Foil M.D.   On: 11/20/2020 19:30   DG Knee Complete 4 Views Left  Result  Date: 11/20/2020 CLINICAL DATA:  Bruising and swelling EXAM: LEFT KNEE - COMPLETE 4+ VIEW COMPARISON:  03/16/2008 . FINDINGS: no definitive fracture or malalignment. Mild tricompartment arthritis of the knee. No sizable effusion. Edema within the prepatellar and infrapatellar soft tissues IMPRESSION: No acute osseous abnormality Electronically Signed   By: Donavan Foil M.D.   On: 11/20/2020 19:17    ____________________________________________    PROCEDURES  Procedure(s) performed:     Procedures     Medications - No data to display   ____________________________________________   INITIAL IMPRESSION / ASSESSMENT AND PLAN / ED COURSE  Pertinent labs & imaging results that were available during my care of the patient were reviewed by me and considered in my medical decision making (see chart for details).    Assessment and plan Fall 80 year old female presents to the emergency department after a mechanical, nonsyncopal fall.  CT head and CT cervical spine showed no evidence of intracranial bleed, skull fracture or C-spine fracture.  No bony abnormality was visualized on x-ray of the left knee.  Patient states that she can take Tylenol at home for discomfort and declined prescriptions for stronger pain medicine.  All patient questions were answered.      ____________________________________________  FINAL CLINICAL IMPRESSION(S) / ED DIAGNOSES  Final diagnoses:  Fall, initial encounter      NEW MEDICATIONS STARTED DURING THIS VISIT:  ED Discharge Orders     None           This chart was dictated using voice recognition software/Dragon. Despite best efforts to proofread, errors can occur which can change the meaning. Any change was purely unintentional.     Karren Cobble 11/20/20 2102    Vladimir Crofts, MD 11/20/20 2330

## 2020-11-20 NOTE — Discharge Instructions (Addendum)
You can continue to take Tylenol at home for pain.

## 2020-11-20 NOTE — ED Triage Notes (Signed)
Pt to ED for fall d/t tripping over rock.  Bruising and swelling noted to left knee Bruising and swelling noted to left forehead Denies LOC

## 2020-11-27 DIAGNOSIS — M25562 Pain in left knee: Secondary | ICD-10-CM | POA: Diagnosis not present

## 2020-11-27 DIAGNOSIS — W010XXD Fall on same level from slipping, tripping and stumbling without subsequent striking against object, subsequent encounter: Secondary | ICD-10-CM | POA: Diagnosis not present

## 2020-11-27 DIAGNOSIS — M25561 Pain in right knee: Secondary | ICD-10-CM | POA: Diagnosis not present

## 2020-11-28 ENCOUNTER — Telehealth (INDEPENDENT_AMBULATORY_CARE_PROVIDER_SITE_OTHER): Payer: Self-pay | Admitting: Vascular Surgery

## 2020-11-28 NOTE — Telephone Encounter (Signed)
Called stating that she fell 11/20/2020. Patient states she went to ED to get checked out for broken bones etc. Patient states that she was still having issues after the fact so she made ana ppt with PA at her PCP's office to check out her lle swelling, red and hot to touch. Patient states she is concerned and would like to come in to be seen. Patient was last seen 05/2020 with le ven reflux/abi studies and is due to come back in Jan 2023. Please advise.

## 2020-11-28 NOTE — Telephone Encounter (Signed)
Dvt study ..due to provider unavailability she probably will not be able to be scheduled until after next week so if issues arise before then she should go to urgent care to be seen

## 2020-11-29 ENCOUNTER — Emergency Department: Payer: Medicare HMO

## 2020-11-29 ENCOUNTER — Emergency Department
Admission: EM | Admit: 2020-11-29 | Discharge: 2020-11-29 | Disposition: A | Discharge status: Home / Self Care | Payer: Medicare HMO | Attending: Emergency Medicine | Admitting: Emergency Medicine

## 2020-11-29 ENCOUNTER — Other Ambulatory Visit
Admission: RE | Admit: 2020-11-29 | Discharge: 2020-11-29 | Disposition: A | Discharge status: Home / Self Care | Payer: Medicare HMO | Source: Ambulatory Visit | Attending: Internal Medicine | Admitting: Internal Medicine

## 2020-11-29 ENCOUNTER — Other Ambulatory Visit: Payer: Self-pay

## 2020-11-29 ENCOUNTER — Encounter: Payer: Self-pay | Admitting: Intensive Care

## 2020-11-29 DIAGNOSIS — Z79899 Other long term (current) drug therapy: Secondary | ICD-10-CM | POA: Insufficient documentation

## 2020-11-29 DIAGNOSIS — Z7982 Long term (current) use of aspirin: Secondary | ICD-10-CM | POA: Diagnosis not present

## 2020-11-29 DIAGNOSIS — R911 Solitary pulmonary nodule: Secondary | ICD-10-CM | POA: Diagnosis not present

## 2020-11-29 DIAGNOSIS — Z7901 Long term (current) use of anticoagulants: Secondary | ICD-10-CM | POA: Diagnosis not present

## 2020-11-29 DIAGNOSIS — Z85118 Personal history of other malignant neoplasm of bronchus and lung: Secondary | ICD-10-CM | POA: Insufficient documentation

## 2020-11-29 DIAGNOSIS — J45909 Unspecified asthma, uncomplicated: Secondary | ICD-10-CM | POA: Insufficient documentation

## 2020-11-29 DIAGNOSIS — I82442 Acute embolism and thrombosis of left tibial vein: Secondary | ICD-10-CM | POA: Insufficient documentation

## 2020-11-29 DIAGNOSIS — Z8739 Personal history of other diseases of the musculoskeletal system and connective tissue: Secondary | ICD-10-CM | POA: Diagnosis not present

## 2020-11-29 DIAGNOSIS — I728 Aneurysm of other specified arteries: Secondary | ICD-10-CM | POA: Diagnosis not present

## 2020-11-29 DIAGNOSIS — R600 Localized edema: Secondary | ICD-10-CM | POA: Diagnosis present

## 2020-11-29 DIAGNOSIS — Z87828 Personal history of other (healed) physical injury and trauma: Secondary | ICD-10-CM | POA: Diagnosis not present

## 2020-11-29 DIAGNOSIS — E039 Hypothyroidism, unspecified: Secondary | ICD-10-CM | POA: Insufficient documentation

## 2020-11-29 DIAGNOSIS — R0602 Shortness of breath: Secondary | ICD-10-CM | POA: Diagnosis not present

## 2020-11-29 DIAGNOSIS — M79662 Pain in left lower leg: Secondary | ICD-10-CM | POA: Insufficient documentation

## 2020-11-29 DIAGNOSIS — R0789 Other chest pain: Secondary | ICD-10-CM | POA: Insufficient documentation

## 2020-11-29 DIAGNOSIS — I10 Essential (primary) hypertension: Secondary | ICD-10-CM | POA: Diagnosis not present

## 2020-11-29 DIAGNOSIS — Z9181 History of falling: Secondary | ICD-10-CM | POA: Diagnosis not present

## 2020-11-29 DIAGNOSIS — L03116 Cellulitis of left lower limb: Secondary | ICD-10-CM | POA: Diagnosis not present

## 2020-11-29 DIAGNOSIS — M7989 Other specified soft tissue disorders: Secondary | ICD-10-CM | POA: Insufficient documentation

## 2020-11-29 DIAGNOSIS — M79605 Pain in left leg: Secondary | ICD-10-CM | POA: Diagnosis not present

## 2020-11-29 LAB — BASIC METABOLIC PANEL
Anion gap: 8 (ref 5–15)
BUN: 17 mg/dL (ref 8–23)
CO2: 27 mmol/L (ref 22–32)
Calcium: 8.7 mg/dL — ABNORMAL LOW (ref 8.9–10.3)
Chloride: 99 mmol/L (ref 98–111)
Creatinine, Ser: 0.57 mg/dL (ref 0.44–1.00)
GFR, Estimated: >60 mL/min (ref >60)
Glucose, Bld: 99 mg/dL (ref 70–99)
Potassium: 3.8 mmol/L (ref 3.5–5.1)
Sodium: 134 mmol/L — ABNORMAL LOW (ref 135–145)

## 2020-11-29 LAB — CBC
HCT: 39.5 % (ref 36.0–46.0)
Hemoglobin: 13.1 g/dL (ref 12.0–15.0)
MCH: 29.6 pg (ref 26.0–34.0)
MCHC: 33.2 g/dL (ref 30.0–36.0)
MCV: 89.2 fL (ref 80.0–100.0)
Platelets: 244 10*3/uL (ref 150–400)
RBC: 4.43 MIL/uL (ref 3.87–5.11)
RDW: 13.8 % (ref 11.5–15.5)
WBC: 6.3 10*3/uL (ref 4.0–10.5)
nRBC: 0 % (ref 0.0–0.2)

## 2020-11-29 LAB — TROPONIN I (HIGH SENSITIVITY)
Troponin I (High Sensitivity): 3 ng/L (ref <18)
Troponin I (High Sensitivity): 3 ng/L (ref <18)

## 2020-11-29 LAB — D-DIMER, QUANTITATIVE: D-Dimer, Quant: 1.62 ug/mL-FEU — ABNORMAL HIGH (ref 0.00–0.50)

## 2020-11-29 MED ORDER — IOHEXOL 350 MG/ML SOLN
75.0000 mL | Freq: Once | INTRAVENOUS | Status: AC | PRN
  Administered 2020-11-29: 75 mL via INTRAVENOUS

## 2020-11-29 MED ORDER — RIVAROXABAN (XARELTO) VTE STARTER PACK (15 & 20 MG)
ORAL_TABLET | ORAL | 0 refills | Status: DC
  Filled 2020-12-01: qty 51, 30d supply, fill #0

## 2020-11-29 MED ORDER — RIVAROXABAN 15 MG PO TABS
15.0000 mg | ORAL_TABLET | Freq: Once | ORAL | Status: AC
  Administered 2020-11-29: 15 mg via ORAL
  Filled 2020-11-29: qty 1

## 2020-11-29 NOTE — ED Provider Notes (Signed)
[facility] Emergency Department Provider Note  ____________________________________________  Time seen: Approximately 7:40 PM  I have reviewed the triage vital signs and the nursing notes.   HISTORY  Chief Complaint Shortness of Breath, Chest Pain, and Leg Swelling    HPI Lindsey Thomas is a 80 y.o. female who presents to the ED for evaluation of left lower extremity edema, erythema, warmth as well as chest tightness.  Patient had sustained a fall and had extensive bruising to both lower extremities and across the face.  Patient was concerned that she may have a DVT given the progression of edema and erythema following this localized trauma.  No history of DVT or PE.  No medication for this complaint prior to arrival.  She had been followed by her primary care after the initial injury and given the increasing edema to left lower extremity there was concern that she may have a DVT and was referred to the emergency department for evaluation.  Medical history as described below.       Past Medical History:  Diagnosis Date   Anxiety    Arthritis    Asthma    Edema    feet   Emphysema of lung ([place])    GERD (gastroesophageal reflux disease)    Headache    MIGRAINES   History of orthopnea    Hypercholesterolemia    Hypothyroidism    NODULES   Lymphedema    Migraine    Non-small cell lung cancer, right ([place]) 05/2019   Rad tx's   Shortness of breath dyspnea    WITH EXERTION   Sleep apnea    MILD, DOES NOT USE CPAP   Thyroid nodule     Patient Active Problem List   Diagnosis Date Noted   Dyspnea and respiratory abnormalities 02/18/2020   Diverticulosis 11/19/2019   Lung cancer ([place]) 12/03/2018   Hypertension 04/26/2017   Varicose veins of leg with pain, 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   Leg swelling 04/23/2016   Lymphedema 04/23/2016   Venous stasis 12/23/2015   Thyroid nodule 06/25/2015   Anxiety 12/31/2013   Aphasia 12/31/2013   Obesity 12/31/2013    Derangement of posterior horn of medial meniscus 09/13/2013   Neck pain 09/13/2013   Pure hypercholesterolemia 09/13/2013   Disorder of bursae and tendons in shoulder region 09/13/2013    Past Surgical History:  Procedure Laterality Date   ABDOMINAL HYSTERECTOMY     BREAST BIOPSY     CARDIAC CATHETERIZATION     CATARACT EXTRACTION W/PHACO Left 02/12/2016   Procedure: CATARACT EXTRACTION PHACO AND INTRAOCULAR LENS PLACEMENT ([place]);  Surgeon: Eulogio Bear, MD;  Location: [facility] ORS;  Service: Ophthalmology;  Laterality: Left;  [place] 01:30 AP% 15.2 CDE 13.71 Fluid pack lot # [id number]   CATARACT EXTRACTION W/PHACO Right 03/18/2016   Procedure: CATARACT EXTRACTION PHACO AND INTRAOCULAR LENS PLACEMENT (IOC);  Surgeon: Eulogio Bear, MD;  Location: [facility] ORS;  Service: Ophthalmology;  Laterality: Right;  Lot # [id number] [place]: 01:05.5 AP%:14.3 CDE: 9.33   FRACTURE SURGERY Left    arm rod and screw   KNEE ARTHROSCOPY     OOPHORECTOMY     RCR     ROTATOR CUFF REPAIR Left 2006    Prior to Admission medications   Medication Sig Start Date End Date Taking? Authorizing Provider  RIVAROXABAN Alveda Reasons) VTE STARTER PACK (15 & 20 MG) Follow package directions: Take one 15mg  tablet by mouth twice a day. On day 22, switch to one 20mg  tablet once a  day. Take with food. 11/29/20  Yes Norvin Ohlin, Charline Bills, PA-C  aspirin EC 81 MG tablet Take 81 mg by mouth daily.    [provider]  butalbital-acetaminophen-caffeine (FIORICET) [phone] MG tablet Take 1-2 tablets by mouth every 6 (six) hours as needed for headache. 06/16/20 06/16/21  Mayjor Ager, Charline Bills, PA-C  cetirizine (ZYRTEC) 10 MG tablet Take 10 mg by mouth at bedtime.     [provider]  Cholecalciferol (VITAMIN D PO) Take 5,000 Units by mouth daily.    [provider]  Cyanocobalamin (VITAMIN B-12 PO) Take 2 tablets by mouth daily.    [provider]  fluticasone (FLONASE) 50 MCG/ACT nasal spray Place 2 sprays  into both nostrils daily. 07/26/18   [provider]  furosemide (LASIX) 20 MG tablet Take by mouth.  10/10/17 05/15/20  [provider]  ibandronate (BONIVA) 150 MG tablet 150 mg. 04/17/20   [provider]  loratadine (CLARITIN) 10 MG tablet Take 10 mg by mouth daily.    [provider]  methocarbamol (ROBAXIN) 500 MG tablet Take 1 tablet (500 mg total) by mouth 4 (four) times daily. 06/16/20   Almin Livingstone, Charline Bills, PA-C  montelukast (SINGULAIR) 10 MG tablet Take 10 mg by mouth at bedtime.     [provider]  Multiple Vitamins-Minerals (PRESERVISION AREDS PO) Take 1 capsule by mouth 2 (two) times daily.    [provider]  omeprazole (PRILOSEC) 20 MG capsule Take 20 mg by mouth 2 (two) times daily before a meal.    [provider]  predniSONE (DELTASONE) 50 MG tablet Take 1 tablet (50 mg total) by mouth daily with breakfast. 06/16/20   Javaughn Opdahl, Charline Bills, PA-C  traMADol (ULTRAM) 50 MG tablet Take 1 tablet (50 mg total) by mouth every 6 (six) hours as needed. 05/23/20   Caryn Section, Linden Dolin, PA-C  TRELEGY ELLIPTA 100-62.5-25 MCG/INH AEPB Inhale 1 puff into the lungs daily. 11/13/19   [provider]  Turmeric 500 MG TABS Take 1 tablet by mouth 1 day or 1 dose.    [provider]  VENTOLIN HFA 108 (90 Base) MCG/ACT inhaler Inhale 2 puffs into the lungs as needed. 02/16/19   [provider]    Allergies Augmentin [amoxicillin-pot clavulanate], Celecoxib, Eryc [erythromycin], and Percocet [oxycodone-acetaminophen]  Family History  Problem Relation Age of Onset   Emphysema Mother    Heart Problems Mother    Tuberculosis Father    Brain cancer Father    Skin cancer Father    Breast cancer Sister    Irritable bowel syndrome Sister    Lung cancer Brother    Dementia Sister    Schizophrenia Sister     Social History Social History   Tobacco Use   Smoking status: Never   Smokeless tobacco: Never  Substance  Use Topics   Alcohol use: No   Drug use: Never     Review of Systems  Constitutional: No fever/chills Eyes: No visual changes. No discharge ENT: No upper respiratory complaints. Cardiovascular: no chest pain. Respiratory: no cough. No SOB. Gastrointestinal: No abdominal pain.  No nausea, no vomiting.  Musculoskeletal: Increased edema and erythema of the left lower extremity following trauma Skin: Negative for rash, abrasions, lacerations, ecchymosis. Neurological: Negative for headaches, focal weakness or numbness.  10 System ROS otherwise negative.  ____________________________________________   PHYSICAL EXAM:  VITAL SIGNS: ED Triage Vitals  Enc Vitals Group     BP 11/29/20 1354 (!) 153/68     Pulse  Rate 11/29/20 1354 76     Resp 11/29/20 1354 18     Temp 11/29/20 1354 98.2 F (36.8 C)     Temp Source 11/29/20 1354 Oral     SpO2 11/29/20 1354 99 %     Weight 11/29/20 1345 176 lb (79.8 kg)     Height 11/29/20 1345 4' 9.5" (1.461 m)     Head Circumference --      Peak Flow --      Pain Score 11/29/20 1345 4     Pain Loc --      Pain Edu? --      Excl. in [place]? --      Constitutional: Alert and oriented. Well appearing and in no acute distress. Eyes: Conjunctivae are normal. PERRL. EOMI. Head: Atraumatic. ENT:      Ears:       Nose: No congestion/rhinnorhea.      Mouth/Throat: Mucous membranes are moist.  Neck: No stridor.    Cardiovascular: Normal rate, regular rhythm. Normal S1 and S2.  Good peripheral circulation. Respiratory: Normal respiratory effort without tachypnea or retractions. Lungs CTAB. Good air entry to the bases with no decreased or absent breath sounds. Gastrointestinal: Bowel sounds 4 quadrants. Soft and nontender to palpation. No guarding or rigidity. No palpable masses. No distention. No CVA tenderness. Musculoskeletal: Full range of motion to all extremities. No gross deformities appreciated.  Visualization of the left lower extremity revealed  edema with some erythema with stages of healing ecchymosis along the left lower extremity.  Patient has multiple areas of ecchymosis bilaterally secondary to a fall.  Of concern is the left lower extremity between the knee and the ankle that is erythematous and edematous and tender over the calf.  Results pedis pulses sensation intact distally. Neurologic:  Normal speech and language. No gross focal neurologic deficits are appreciated.  Skin:  Skin is warm, dry and intact. No rash noted. Psychiatric: Mood and affect are normal. Speech and behavior are normal. Patient exhibits appropriate insight and judgement.   ____________________________________________   LABS (all labs ordered are listed, but only abnormal results are displayed)  Labs Reviewed  BASIC METABOLIC PANEL - Abnormal; Notable for the following components:      Result Value   Sodium 134 (*)    Calcium 8.7 (*)    All other components within normal limits  CBC  TROPONIN I (HIGH SENSITIVITY)  TROPONIN I (HIGH SENSITIVITY)   ____________________________________________  EKG  ED ECG REPORT I, Charline Bills Verenise Moulin,  personally viewed and interpreted this ECG.   Date: 11/29/2020  EKG Time: 1403 hrs.  Rate: 72 bpm  Rhythm: unchanged from previous tracings, normal sinus rhythm  Axis: Left axis deviation  Intervals:none  ST&T Change: No concerning ST elevation or depression noted  No STEMI.  Normal sinus rhythm. ____________________________________________  RADIOLOGY I personally viewed and evaluated these images as part of my medical decision making, as well as reviewing the written report by the radiologist.  ED Provider Interpretation: Chest x-ray without acute finding.  CT PE chest reveals no evidence of PE.  Patient's ultrasound of the left lower extremity reveals occlusive DVT consistent with acute DVT of the left tibial vein  DG Chest 2 View  Result Date: 11/29/2020 CLINICAL DATA:  80 year old female with a  history of shortness of breath EXAM: CHEST - 2 VIEW COMPARISON:  05/23/2020 FINDINGS: Cardiomediastinal silhouette unchanged in size and contour. No evidence of central vascular congestion. No interlobular septal thickening. Similar appearance of linear  scarring on the lateral view, which is improved on the frontal view in the right mid lung. No pneumothorax or pleural effusion. Coarsened interstitial markings, with no confluent airspace disease. No acute displaced fracture. Degenerative changes of the spine. IMPRESSION: Chronic lung changes without evidence of acute cardiopulmonary disease Electronically Signed   By: Corrie Mckusick D.O.   On: 11/29/2020 14:20   CT Angio Chest PE W and/or Wo Contrast  Result Date: 11/29/2020 CLINICAL DATA:  Suspected pulmonary embolism in an 80 year old female with LEFT leg swelling and shortness of breath. Patient was found to have DVT in the calf. EXAM: CT ANGIOGRAPHY CHEST WITH CONTRAST TECHNIQUE: Multidetector CT imaging of the chest was performed using the standard protocol during bolus administration of intravenous contrast. Multiplanar CT image reconstructions and MIPs were obtained to evaluate the vascular anatomy. CONTRAST:  57mL OMNIPAQUE IOHEXOL 350 MG/ML SOLN COMPARISON:  Comparison made with July 25, 2020. FINDINGS: Cardiovascular: Calcified atheromatous plaque of the thoracic aorta. Normal caliber. Heart size is stable and un enlarged. Central pulmonary vascular enhancement at 4 in 35 Hounsfield units. Vascular bed well opacified with adequate evaluation of the pulmonary arterial bed. No sign of pulmonary embolism. Mediastinum/Nodes: Esophagus grossly normal by CT. LEFT thyroid lesion unchanged approximately 2.3 cm greatest axial dimension. No thoracic inlet lymphadenopathy. No axillary lymphadenopathy. No mediastinal or hilar lymphadenopathy. Lungs/Pleura: Stable pulmonary nodule in the LEFT upper lobe 6 mm. Unchanged since at least February of 2021 compatible  with small benign pulmonary nodule. Post treatment changes in the RIGHT chest as before. Tiny pulmonary nodules in the RIGHT upper lobe are unchanged. No new suspicious pulmonary nodule. The airways are patent. No effusion or acute consolidative process. Upper Abdomen: Incidental imaging of upper abdominal contents shows no acute process. 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 Musculoskeletal: No acute bone finding or destructive bone process. Review of the MIP images confirms the above findings. IMPRESSION: 1. No evidence of pulmonary embolism. 2. Stable pulmonary nodule in the LEFT upper lobe since at least February of 2021 compatible with small benign pulmonary nodule. 3. Post treatment changes in the RIGHT chest as before. 4. Stable LEFT thyroid lesion. This measures greater than 2 cm. Recommend thyroid US if not yet performed(ref: J Am Coll Radiol. 2015 Feb;12(2): 143-50). 5. Stable 9 mm splenic artery aneurysm. 6. Aortic atherosclerosis. Aortic Atherosclerosis (ICD10-I70.0). Electronically Signed   By: Zetta Bills M.D.   On: 11/29/2020 17:33   US Venous Img Lower Unilateral Left  Result Date: 11/29/2020 CLINICAL DATA:  Left leg pain, bruising, and swelling for the past 3 days. Recent fall. EXAM: LEFT LOWER EXTREMITY VENOUS DOPPLER ULTRASOUND TECHNIQUE: Gray-scale sonography with compression, as well as color and duplex ultrasound, were performed to evaluate the deep venous system(s) from the level of the common femoral vein through the popliteal and proximal calf veins. COMPARISON:  Oct 29, 2015. FINDINGS: VENOUS Acute occlusive thrombus within the posterior tibial vein. Normal compressibility of the common femoral, superficial femoral, and popliteal veins, as well as the visualized peroneal vein. Visualized portions of profunda femoral vein and great saphenous vein unremarkable. Doppler waveforms show normal direction of venous flow, normal respiratory plasticity and response to augmentation.  Limited views of the contralateral common femoral vein are unremarkable. OTHER None. Limitations: None. IMPRESSION: Acute occlusive DVT within the left posterior tibial vein. Electronically Signed   By: Titus Dubin M.D.   On: 11/29/2020 14:54    ____________________________________________    PROCEDURES  Procedure(s) performed:  Procedures    Medications  iohexol (OMNIPAQUE) 350 MG/ML injection 75 mL (75 mLs Intravenous Contrast Given 11/29/20 1653)  Rivaroxaban (XARELTO) tablet 15 mg (15 mg Oral Given 11/29/20 2036)     ____________________________________________   INITIAL IMPRESSION / ASSESSMENT AND PLAN / ED COURSE  Pertinent labs & imaging results that were available during my care of the patient were reviewed by me and considered in my medical decision making (see chart for details).  Review of the [place] CSRS was performed in accordance of the [place] prior to dispensing any controlled drugs.  Clinical Course as of 11/29/20 2117  Sat Nov 29, 2020  1823 RBC: 4.43 [[place]]    Clinical Course User Index [[place]] Franco Collet, Talitha Givens          Patient's diagnosis is consistent with DVT.  Patient presented to the emergency department complaining of left lower extremity pain and chest tightness.  Patient's labs, EKG and imaging of the chest is reassuring.  Patient does have findings consistent with DVT of the left lower extremity.  Differential included pneumonia, COVID, PE, ACS/STEMI, DVT, cellulitis, resolving ecchymosis from previous trauma.  Patient will be started on Xarelto and referred back to primary care for follow-up.  Return precautions are discussed with the patient and her daughter.  I have provided extensive education regarding anticoagulation to the patient and need for reassessment.  Patient verbalizes understanding same.  Patient is given ED precautions to return to the ED for any worsening or new  symptoms.     ____________________________________________  FINAL CLINICAL IMPRESSION(S) / ED DIAGNOSES  Final diagnoses:  Acute deep vein thrombosis (DVT) of tibial vein of left lower extremity (HCC)      NEW MEDICATIONS STARTED DURING THIS VISIT:  ED Discharge Orders          Ordered    RIVAROXABAN (XARELTO) VTE STARTER PACK (15 & 20 MG)        11/29/20 2021                This chart was dictated using voice recognition software/Dragon. Despite best efforts to proofread, errors can occur which can change the meaning. Any change was purely unintentional.    Darletta Moll, PA-C 11/29/20 2117    Lucrezia Starch, MD 11/30/20 [phone]

## 2020-11-29 NOTE — ED Provider Notes (Signed)
Emergency Medicine Provider Triage Evaluation Note  Lindsey Thomas , a 80 y.o. female  was evaluated in triage.  Pt complains of left leg swelling x 3 days with associated shortness of breath. Patient reports hx of fall about 10 days ago, was seen here. 3 days ago, developed swelling in leg and now has shortness of breath. Reports she takes aspirin, but no other blood thinners. Denies any associated cough, fever, chills or other symptoms  Review of Systems  Positive: Left leg swelling, shortness of breath Negative: Cough, fever  Physical Exam  Ht 4' 9.5" (1.461 m)   Wt 79.8 kg   BMI 37.43 kg/m  Gen:   Awake, no distress  Resp:  Normal effort  MSK:   Notable erythema and swelling to the left lower extremity with associated ecchymosis over the proximal tibia/fibula region. Tenderness through lower leg noted. Right LE with associated ecchymosis reported from fall but without any swelling. Other:    Medical Decision Making  Medically screening exam initiated at 1:52 PM.  Appropriate orders placed.  Lindsey Thomas was informed that the remainder of the evaluation will be completed by another provider, this initial triage assessment does not replace that evaluation, and the importance of remaining in the ED until their evaluation is complete.     Lindsey Salvage, PA 11/29/20 1354    Lindsey Starch, MD 11/29/20 [phone]

## 2020-11-29 NOTE — ED Triage Notes (Signed)
Patient seen on 11/20/20 for fall, evaluated and sent home. Patient ambulatory from [facility] with c/o sob and chest tightness that started yesterday and swollen left leg that [place] is concerned about blood clot. Reports pain in leg worse with ambulation. Swelling noted to left leg.

## 2020-12-01 ENCOUNTER — Other Ambulatory Visit: Payer: Self-pay

## 2020-12-02 DIAGNOSIS — Z86718 Personal history of other venous thrombosis and embolism: Secondary | ICD-10-CM | POA: Diagnosis not present

## 2020-12-15 DIAGNOSIS — M7521 Bicipital tendinitis, right shoulder: Secondary | ICD-10-CM | POA: Insufficient documentation

## 2020-12-15 DIAGNOSIS — M7581 Other shoulder lesions, right shoulder: Secondary | ICD-10-CM | POA: Diagnosis not present

## 2020-12-15 DIAGNOSIS — M25511 Pain in right shoulder: Secondary | ICD-10-CM | POA: Diagnosis not present

## 2021-01-01 ENCOUNTER — Other Ambulatory Visit: Payer: Self-pay | Admitting: Internal Medicine

## 2021-01-01 DIAGNOSIS — Z86718 Personal history of other venous thrombosis and embolism: Secondary | ICD-10-CM | POA: Diagnosis not present

## 2021-01-01 DIAGNOSIS — N644 Mastodynia: Secondary | ICD-10-CM

## 2021-01-01 DIAGNOSIS — I824Y2 Acute embolism and thrombosis of unspecified deep veins of left proximal lower extremity: Secondary | ICD-10-CM | POA: Insufficient documentation

## 2021-01-01 NOTE — Telephone Encounter (Signed)
Called and scheduled patient

## 2021-01-06 ENCOUNTER — Ambulatory Visit (INDEPENDENT_AMBULATORY_CARE_PROVIDER_SITE_OTHER): Payer: Medicare HMO | Admitting: Nurse Practitioner

## 2021-01-06 ENCOUNTER — Encounter (INDEPENDENT_AMBULATORY_CARE_PROVIDER_SITE_OTHER): Payer: Medicare HMO

## 2021-01-06 DIAGNOSIS — M7581 Other shoulder lesions, right shoulder: Secondary | ICD-10-CM | POA: Diagnosis not present

## 2021-01-06 DIAGNOSIS — M25611 Stiffness of right shoulder, not elsewhere classified: Secondary | ICD-10-CM | POA: Diagnosis not present

## 2021-01-06 DIAGNOSIS — M25511 Pain in right shoulder: Secondary | ICD-10-CM | POA: Diagnosis not present

## 2021-01-06 DIAGNOSIS — M6281 Muscle weakness (generalized): Secondary | ICD-10-CM | POA: Diagnosis not present

## 2021-01-08 ENCOUNTER — Telehealth: Payer: Self-pay | Admitting: Oncology

## 2021-01-08 NOTE — Telephone Encounter (Signed)
I called the patient and she states that on 6/25 she had a LLE that u/s showed to be thrombosis.Because of her having SOB the ER md ordered a CT angio to rule out a PE.  And this angio was negative. Since she had this scan the pt wanted to know doe she still have to have ct of chest 8/22 or could  we cancel it for you. Sending it to Dr. Janese Banks to respond and will call her back. Pt agreeable for [place] to call lack

## 2021-01-08 NOTE — Telephone Encounter (Signed)
Patient called and would like to know if she still needs her CT scan on 8/22. She had another CT scan in the ED on 6/25.

## 2021-01-13 ENCOUNTER — Other Ambulatory Visit: Payer: Self-pay

## 2021-01-13 ENCOUNTER — Ambulatory Visit
Admission: RE | Admit: 2021-01-13 | Discharge: 2021-01-13 | Disposition: A | Discharge status: Home / Self Care | Payer: Medicare HMO | Source: Ambulatory Visit | Attending: Internal Medicine | Admitting: Internal Medicine

## 2021-01-13 DIAGNOSIS — N644 Mastodynia: Secondary | ICD-10-CM

## 2021-01-13 DIAGNOSIS — R928 Other abnormal and inconclusive findings on diagnostic imaging of breast: Secondary | ICD-10-CM | POA: Diagnosis not present

## 2021-01-13 DIAGNOSIS — R922 Inconclusive mammogram: Secondary | ICD-10-CM | POA: Diagnosis not present

## 2021-01-16 ENCOUNTER — Telehealth: Payer: Self-pay | Admitting: *Deleted

## 2021-01-16 NOTE — Telephone Encounter (Signed)
Informed pt that Dr Janese Banks did view her CT Angio on 11/29/20 . Dr Janese Banks states it would be okay to cancel the labs and CT scan on 01/26/21 and the Virtual visit with Dr Janese Banks on 02/03/21. Patient very pleased with this news as she has a lot going on. We will reschedule CT chest for December and see Dr Janese Banks after CT scan. We will inform her of new appts.

## 2021-01-21 ENCOUNTER — Emergency Department
Admission: EM | Admit: 2021-01-21 | Discharge: 2021-01-21 | Disposition: A | Discharge status: Home / Self Care | Payer: Medicare HMO | Attending: Emergency Medicine | Admitting: Emergency Medicine

## 2021-01-21 ENCOUNTER — Encounter: Payer: Self-pay | Admitting: *Deleted

## 2021-01-21 ENCOUNTER — Other Ambulatory Visit: Payer: Self-pay

## 2021-01-21 DIAGNOSIS — Z85118 Personal history of other malignant neoplasm of bronchus and lung: Secondary | ICD-10-CM | POA: Diagnosis not present

## 2021-01-21 DIAGNOSIS — D485 Neoplasm of uncertain behavior of skin: Secondary | ICD-10-CM | POA: Diagnosis not present

## 2021-01-21 DIAGNOSIS — Z23 Encounter for immunization: Secondary | ICD-10-CM | POA: Diagnosis not present

## 2021-01-21 DIAGNOSIS — L578 Other skin changes due to chronic exposure to nonionizing radiation: Secondary | ICD-10-CM | POA: Diagnosis not present

## 2021-01-21 DIAGNOSIS — M7581 Other shoulder lesions, right shoulder: Secondary | ICD-10-CM | POA: Diagnosis not present

## 2021-01-21 DIAGNOSIS — Z7982 Long term (current) use of aspirin: Secondary | ICD-10-CM | POA: Diagnosis not present

## 2021-01-21 DIAGNOSIS — Z7901 Long term (current) use of anticoagulants: Secondary | ICD-10-CM | POA: Insufficient documentation

## 2021-01-21 DIAGNOSIS — J45909 Unspecified asthma, uncomplicated: Secondary | ICD-10-CM | POA: Insufficient documentation

## 2021-01-21 DIAGNOSIS — Y92094 Garage of other non-institutional residence as the place of occurrence of the external cause: Secondary | ICD-10-CM | POA: Insufficient documentation

## 2021-01-21 DIAGNOSIS — S81812A Laceration without foreign body, left lower leg, initial encounter: Secondary | ICD-10-CM | POA: Diagnosis not present

## 2021-01-21 DIAGNOSIS — S8992XA Unspecified injury of left lower leg, initial encounter: Secondary | ICD-10-CM | POA: Diagnosis present

## 2021-01-21 DIAGNOSIS — Z7951 Long term (current) use of inhaled steroids: Secondary | ICD-10-CM | POA: Diagnosis not present

## 2021-01-21 DIAGNOSIS — W268XXA Contact with other sharp object(s), not elsewhere classified, initial encounter: Secondary | ICD-10-CM | POA: Diagnosis not present

## 2021-01-21 DIAGNOSIS — E039 Hypothyroidism, unspecified: Secondary | ICD-10-CM | POA: Insufficient documentation

## 2021-01-21 DIAGNOSIS — Z872 Personal history of diseases of the skin and subcutaneous tissue: Secondary | ICD-10-CM | POA: Diagnosis not present

## 2021-01-21 DIAGNOSIS — Z86018 Personal history of other benign neoplasm: Secondary | ICD-10-CM | POA: Diagnosis not present

## 2021-01-21 DIAGNOSIS — D2271 Melanocytic nevi of right lower limb, including hip: Secondary | ICD-10-CM | POA: Diagnosis not present

## 2021-01-21 HISTORY — DX: Acute embolism and thrombosis of unspecified deep veins of unspecified lower extremity: I82.409

## 2021-01-21 MED ORDER — TETANUS-DIPHTH-ACELL PERTUSSIS 5-2.5-18.5 LF-MCG/0.5 IM SUSY
0.5000 mL | PREFILLED_SYRINGE | Freq: Once | INTRAMUSCULAR | Status: AC
  Administered 2021-01-21: 0.5 mL via INTRAMUSCULAR
  Filled 2021-01-21: qty 0.5

## 2021-01-21 MED ORDER — CEPHALEXIN 500 MG PO CAPS: 500.0000 mg | ORAL_CAPSULE | Freq: Two times a day (BID) | ORAL | 0 refills | Status: AC

## 2021-01-21 NOTE — ED Triage Notes (Signed)
Pt tripped and hit her left lower leg on a piece of metal. Laceration to the left lower leg, small amount of blood drainage toward the edge, dressing applied in triage. Pt is on a blood thinner for DVT.

## 2021-01-21 NOTE — Discharge Instructions (Signed)
Keep wound clean and dry for the next 24 hours.

## 2021-01-21 NOTE — ED Provider Notes (Signed)
[facility]EMERGENCY DEPARTMENT  ____________________________________________  Time seen: Approximately 11:41 PM  I have reviewed the triage vital signs and the nursing notes.   HISTORY  Chief Complaint Laceration   Historian Patient     HPI Lindsey Thomas is a 80 y.o. female presents to the emergency department with a skin tear of the left lower extremity after patient bumped up against a piece of metal in the garage.  She cannot recall her last tetanus shot.  No numbness or tingling in the left lower extremity.   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     Past Medical History:  Diagnosis Date   Anxiety    Arthritis    Asthma    DVT (deep venous thrombosis) (HCC)    Edema    feet   Emphysema of lung (HCC)    GERD (gastroesophageal reflux disease)    Headache    MIGRAINES   History of orthopnea    Hypercholesterolemia    Hypothyroidism    NODULES   Lymphedema    Migraine    Non-small cell lung cancer, right ([place]) 05/2019   Rad tx's   Shortness of breath dyspnea    WITH EXERTION   Sleep apnea    MILD, DOES NOT USE CPAP   Thyroid nodule     Patient Active Problem List   Diagnosis Date Noted   Dyspnea and respiratory abnormalities 02/18/2020   Diverticulosis 11/19/2019   Lung cancer ([place]) 12/03/2018   Hypertension 04/26/2017   Varicose veins of leg with pain, 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   Leg swelling 04/23/2016   Lymphedema 04/23/2016   Venous stasis 12/23/2015    Thyroid nodule 06/25/2015   Anxiety 12/31/2013   Aphasia 12/31/2013   Obesity 12/31/2013   Derangement of posterior horn of medial meniscus 09/13/2013   Neck pain 09/13/2013   Pure hypercholesterolemia 09/13/2013   Disorder of bursae and tendons in shoulder region 09/13/2013    Past Surgical History:  Procedure Laterality Date   ABDOMINAL HYSTERECTOMY     BREAST BIOPSY     CARDIAC CATHETERIZATION     CATARACT EXTRACTION W/PHACO Left 02/12/2016   Procedure: CATARACT EXTRACTION PHACO AND INTRAOCULAR LENS PLACEMENT ([place]);  Surgeon: Eulogio Bear, MD;  Location: [facility] ORS;  Service: Ophthalmology;  Laterality: Left;  [place] 01:30 AP% 15.2 CDE 13.71 Fluid pack lot # [id number]   CATARACT EXTRACTION W/PHACO Right 03/18/2016   Procedure: CATARACT EXTRACTION PHACO AND INTRAOCULAR LENS PLACEMENT ([facility]);  Surgeon: Eulogio Bear, MD;  Location: [facility] ORS;  Service: Ophthalmology;  Laterality: Right;  Lot # [id number] H [place]: 01:05.5 AP%:14.3 CDE: 9.33   FRACTURE SURGERY Left    arm rod and screw   KNEE ARTHROSCOPY     OOPHORECTOMY     RCR     ROTATOR  CUFF REPAIR Left 2006    Prior to Admission medications   Medication Sig Start Date End Date Taking? Authorizing Provider  cephALEXin (KEFLEX) 500 MG capsule Take 1 capsule (500 mg total) by mouth 2 (two) times daily for 7 days. 01/21/21 01/28/21 Yes Lannie Fields, PA-C  aspirin EC 81 MG tablet Take 81 mg by mouth daily.    [provider]  butalbital-acetaminophen-caffeine (FIORICET) [phone] MG tablet Take 1-2 tablets by mouth every 6 (six) hours as needed for headache. 06/16/20 06/16/21  Cuthriell, Charline Bills, PA-C  cetirizine (ZYRTEC) 10 MG tablet Take 10 mg by mouth at bedtime.     [provider]  Cholecalciferol (VITAMIN D PO) Take 5,000 Units by mouth daily.    [provider]  Cyanocobalamin (VITAMIN B-12 PO) Take 2 tablets by mouth daily.    [provider]  fluticasone (FLONASE) 50 MCG/ACT nasal spray  Place 2 sprays into both nostrils daily. 07/26/18   [provider]  furosemide (LASIX) 20 MG tablet Take by mouth.  10/10/17 05/15/20  [provider]  ibandronate (BONIVA) 150 MG tablet 150 mg. 04/17/20   [provider]  loratadine (CLARITIN) 10 MG tablet Take 10 mg by mouth daily.    [provider]  methocarbamol (ROBAXIN) 500 MG tablet Take 1 tablet (500 mg total) by mouth 4 (four) times daily. 06/16/20   Cuthriell, Charline Bills, PA-C  montelukast (SINGULAIR) 10 MG tablet Take 10 mg by mouth at bedtime.     [provider]  Multiple Vitamins-Minerals (PRESERVISION AREDS PO) Take 1 capsule by mouth 2 (two) times daily.    [provider]  omeprazole (PRILOSEC) 20 MG capsule Take 20 mg by mouth 2 (two) times daily before a meal.    [provider]  predniSONE (DELTASONE) 50 MG tablet Take 1 tablet (50 mg total) by mouth daily with breakfast. 06/16/20   Cuthriell, Charline Bills, PA-C  RIVAROXABAN [employer]) VTE STARTER PACK (15 & 20 MG) Follow package directions: Take one 15mg  tablet by mouth twice a day. On day 22, switch to one 20mg  tablet once a day. Take with food. 11/29/20   Cuthriell, Charline Bills, PA-C  traMADol (ULTRAM) 50 MG tablet Take 1 tablet (50 mg total) by mouth every 6 (six) hours as needed. 05/23/20   Caryn Section, Linden Dolin, PA-C  TRELEGY ELLIPTA 100-62.5-25 MCG/INH AEPB Inhale 1 puff into the lungs daily. 11/13/19   [provider]  Turmeric 500 MG TABS Take 1 tablet by mouth 1 day or 1 dose.    [provider]  VENTOLIN HFA 108 (90 Base) MCG/ACT inhaler Inhale 2 puffs into the lungs as needed. 02/16/19   [provider]    Allergies Augmentin [amoxicillin-pot clavulanate], Celecoxib, Eryc [erythromycin], and Percocet [oxycodone-acetaminophen]  Family History  Problem Relation Age of Onset   Emphysema Mother    Heart Problems Mother    Tuberculosis Father    Brain cancer Father    Skin cancer Father     Breast cancer Sister    Irritable bowel syndrome Sister    Lung cancer Brother    Dementia Sister    Schizophrenia Sister     Social History Social History   Tobacco Use   Smoking status: Never   Smokeless tobacco: Never  Substance Use Topics   Alcohol use: No   Drug use: Never     Review of Systems  Constitutional: No fever/chills Eyes:  No discharge ENT: No upper respiratory complaints. Respiratory: no  cough. No SOB/ use of accessory muscles to breath Gastrointestinal:   No nausea, no vomiting.  No diarrhea.  No constipation. Musculoskeletal: Negative for musculoskeletal pain. Skin: Patient has laceration.     ____________________________________________   PHYSICAL EXAM:  VITAL SIGNS: ED Triage Vitals [01/21/21 2012]  Enc Vitals Group     BP (!) 107/95     Pulse Rate 97     Resp 16     Temp 98 F (36.7 C)     Temp Source Oral     SpO2 99 %     Weight      Height      Head Circumference      Peak Flow      Pain Score 5     Pain Loc      Pain Edu?      Excl. in [place]?      Constitutional: Alert and oriented. Well appearing and in no acute distress. Eyes: Conjunctivae are normal. PERRL. EOMI. Head: Atraumatic. ENT: Cardiovascular: Normal rate, regular rhythm. Normal S1 and S2.  Good peripheral circulation. Respiratory: Normal respiratory effort without tachypnea or retractions. Lungs CTAB. Good air entry to the bases with no decreased or absent breath sounds Gastrointestinal: Bowel sounds x 4 quadrants. Soft and nontender to palpation. No guarding or rigidity. No distention. Musculoskeletal: Full range of motion to all extremities. No obvious deformities noted Neurologic:  Normal for age. No gross focal neurologic deficits are appreciated.  Skin: Patient has 3 cm skin tear of left lower leg. Psychiatric: Mood and affect are normal for age. Speech and behavior are normal.   ____________________________________________   LABS (all labs ordered are  listed, but only abnormal results are displayed)  Labs Reviewed - No data to display ____________________________________________  EKG   ____________________________________________  RADIOLOGY   No results found.  ____________________________________________    PROCEDURES  Procedure(s) performed:     Marland KitchenMarland KitchenLaceration Repair  Date/Time: 01/21/2021 11:43 PM Performed by: Lannie Fields, PA-C Authorized by: Lannie Fields, PA-C   Consent:    Consent obtained:  Verbal   Risks discussed:  Pain and infection Universal protocol:    Procedure explained and questions answered to patient or proxy's satisfaction: yes     Patient identity confirmed:  Verbally with patient Anesthesia:    Anesthesia method:  None Laceration details:    Length (cm):  3 Exploration:    Limited defect created (wound extended): yes     Contaminated: no   Treatment:    Area cleansed with:  Povidone-iodine   Amount of cleaning:  Standard   Debridement:  None Skin repair:    Repair method:  Steri-Strips Approximation:    Approximation:  Loose Repair type:    Repair type:  Simple Post-procedure details:    Dressing:  Bulky dressing     Medications  Tdap (BOOSTRIX) injection 0.5 mL (0.5 mLs Intramuscular Given 01/21/21 2210)     ____________________________________________   INITIAL IMPRESSION / ASSESSMENT AND PLAN / ED COURSE  Pertinent labs & imaging results that were available during my care of the patient were reviewed by me and considered in my medical decision making (see chart for details).      Assessment and plan Lower leg laceration  80 year old female presents to the emergency department with a 3 cm skin tear of the left lower extremity.  Active bleeding was well controlled with pressure dressing while in the emergency department.  Steri-Strips were applied and patient was placed in a bulky  dressing loosely wrapped with Coban.  Patient was discharged with Keflex to be taken  twice daily for the next 7 days.  Her tetanus status was updated prior to discharge.     ____________________________________________  FINAL CLINICAL IMPRESSION(S) / ED DIAGNOSES  Final diagnoses:  Skin tear of left lower leg without complication, initial encounter      NEW MEDICATIONS STARTED DURING THIS VISIT:  ED Discharge Orders          Ordered    cephALEXin (KEFLEX) 500 MG capsule  2 times daily        01/21/21 2156                This chart was dictated using voice recognition software/Dragon. Despite best efforts to proofread, errors can occur which can change the meaning. Any change was purely unintentional.     Lannie Fields, PA-C 01/21/21 2345    Naaman Plummer, MD 01/24/21 [id number]

## 2021-01-23 ENCOUNTER — Inpatient Hospital Stay: Payer: Medicare HMO

## 2021-01-26 ENCOUNTER — Ambulatory Visit: Payer: Medicare HMO

## 2021-01-27 DIAGNOSIS — M1711 Unilateral primary osteoarthritis, right knee: Secondary | ICD-10-CM | POA: Diagnosis not present

## 2021-01-27 DIAGNOSIS — M25572 Pain in left ankle and joints of left foot: Secondary | ICD-10-CM | POA: Diagnosis not present

## 2021-01-27 DIAGNOSIS — S93402A Sprain of unspecified ligament of left ankle, initial encounter: Secondary | ICD-10-CM | POA: Diagnosis not present

## 2021-01-28 DIAGNOSIS — L03116 Cellulitis of left lower limb: Secondary | ICD-10-CM | POA: Diagnosis not present

## 2021-01-28 DIAGNOSIS — M7581 Other shoulder lesions, right shoulder: Secondary | ICD-10-CM | POA: Diagnosis not present

## 2021-01-28 DIAGNOSIS — Z86718 Personal history of other venous thrombosis and embolism: Secondary | ICD-10-CM | POA: Diagnosis not present

## 2021-01-30 ENCOUNTER — Telehealth: Payer: Medicare HMO | Admitting: Oncology

## 2021-02-02 DIAGNOSIS — M7581 Other shoulder lesions, right shoulder: Secondary | ICD-10-CM | POA: Diagnosis not present

## 2021-02-02 DIAGNOSIS — M25511 Pain in right shoulder: Secondary | ICD-10-CM | POA: Diagnosis not present

## 2021-02-02 DIAGNOSIS — M7521 Bicipital tendinitis, right shoulder: Secondary | ICD-10-CM | POA: Diagnosis not present

## 2021-02-03 ENCOUNTER — Telehealth: Payer: Medicare HMO | Admitting: Oncology

## 2021-02-04 DIAGNOSIS — Z86718 Personal history of other venous thrombosis and embolism: Secondary | ICD-10-CM | POA: Diagnosis not present

## 2021-02-04 DIAGNOSIS — L03116 Cellulitis of left lower limb: Secondary | ICD-10-CM | POA: Diagnosis not present

## 2021-02-11 DIAGNOSIS — L03116 Cellulitis of left lower limb: Secondary | ICD-10-CM | POA: Diagnosis not present

## 2021-02-11 DIAGNOSIS — S81802D Unspecified open wound, left lower leg, subsequent encounter: Secondary | ICD-10-CM | POA: Diagnosis not present

## 2021-02-11 DIAGNOSIS — Z86718 Personal history of other venous thrombosis and embolism: Secondary | ICD-10-CM | POA: Diagnosis not present

## 2021-02-12 DIAGNOSIS — I824Y2 Acute embolism and thrombosis of unspecified deep veins of left proximal lower extremity: Secondary | ICD-10-CM | POA: Diagnosis not present

## 2021-02-12 DIAGNOSIS — G4733 Obstructive sleep apnea (adult) (pediatric): Secondary | ICD-10-CM | POA: Diagnosis not present

## 2021-02-12 DIAGNOSIS — I890 Lymphedema, not elsewhere classified: Secondary | ICD-10-CM | POA: Diagnosis not present

## 2021-02-12 DIAGNOSIS — Z79899 Other long term (current) drug therapy: Secondary | ICD-10-CM | POA: Diagnosis not present

## 2021-02-13 DIAGNOSIS — M7581 Other shoulder lesions, right shoulder: Secondary | ICD-10-CM | POA: Diagnosis not present

## 2021-02-17 DIAGNOSIS — Z86718 Personal history of other venous thrombosis and embolism: Secondary | ICD-10-CM | POA: Diagnosis not present

## 2021-02-17 DIAGNOSIS — S81802D Unspecified open wound, left lower leg, subsequent encounter: Secondary | ICD-10-CM | POA: Diagnosis not present

## 2021-02-18 DIAGNOSIS — M7581 Other shoulder lesions, right shoulder: Secondary | ICD-10-CM | POA: Diagnosis not present

## 2021-02-19 DIAGNOSIS — R0600 Dyspnea, unspecified: Secondary | ICD-10-CM | POA: Diagnosis not present

## 2021-02-19 DIAGNOSIS — R0609 Other forms of dyspnea: Secondary | ICD-10-CM | POA: Diagnosis not present

## 2021-02-19 DIAGNOSIS — R0689 Other abnormalities of breathing: Secondary | ICD-10-CM | POA: Diagnosis not present

## 2021-02-19 DIAGNOSIS — G4733 Obstructive sleep apnea (adult) (pediatric): Secondary | ICD-10-CM | POA: Diagnosis not present

## 2021-02-24 DIAGNOSIS — Z86718 Personal history of other venous thrombosis and embolism: Secondary | ICD-10-CM | POA: Diagnosis not present

## 2021-02-24 DIAGNOSIS — J208 Acute bronchitis due to other specified organisms: Secondary | ICD-10-CM | POA: Diagnosis not present

## 2021-02-24 DIAGNOSIS — S81802A Unspecified open wound, left lower leg, initial encounter: Secondary | ICD-10-CM | POA: Diagnosis not present

## 2021-02-24 DIAGNOSIS — I890 Lymphedema, not elsewhere classified: Secondary | ICD-10-CM | POA: Diagnosis not present

## 2021-02-24 DIAGNOSIS — U071 COVID-19: Secondary | ICD-10-CM | POA: Diagnosis not present

## 2021-03-03 DIAGNOSIS — E7800 Pure hypercholesterolemia, unspecified: Secondary | ICD-10-CM | POA: Diagnosis not present

## 2021-03-03 DIAGNOSIS — S81802A Unspecified open wound, left lower leg, initial encounter: Secondary | ICD-10-CM | POA: Diagnosis not present

## 2021-03-03 DIAGNOSIS — Z6838 Body mass index (BMI) 38.0-38.9, adult: Secondary | ICD-10-CM | POA: Diagnosis not present

## 2021-03-03 DIAGNOSIS — I890 Lymphedema, not elsewhere classified: Secondary | ICD-10-CM | POA: Diagnosis not present

## 2021-03-03 DIAGNOSIS — R0600 Dyspnea, unspecified: Secondary | ICD-10-CM | POA: Diagnosis not present

## 2021-03-03 DIAGNOSIS — J208 Acute bronchitis due to other specified organisms: Secondary | ICD-10-CM | POA: Diagnosis not present

## 2021-03-03 DIAGNOSIS — I824Y2 Acute embolism and thrombosis of unspecified deep veins of left proximal lower extremity: Secondary | ICD-10-CM | POA: Diagnosis not present

## 2021-03-03 DIAGNOSIS — R0689 Other abnormalities of breathing: Secondary | ICD-10-CM | POA: Diagnosis not present

## 2021-03-03 DIAGNOSIS — U071 COVID-19: Secondary | ICD-10-CM | POA: Diagnosis not present

## 2021-04-08 DIAGNOSIS — I890 Lymphedema, not elsewhere classified: Secondary | ICD-10-CM | POA: Diagnosis not present

## 2021-04-08 DIAGNOSIS — Z86718 Personal history of other venous thrombosis and embolism: Secondary | ICD-10-CM | POA: Diagnosis not present

## 2021-04-08 DIAGNOSIS — L03116 Cellulitis of left lower limb: Secondary | ICD-10-CM | POA: Diagnosis not present

## 2021-04-08 DIAGNOSIS — S81802D Unspecified open wound, left lower leg, subsequent encounter: Secondary | ICD-10-CM | POA: Diagnosis not present

## 2021-05-07 ENCOUNTER — Other Ambulatory Visit: Payer: Self-pay | Admitting: *Deleted

## 2021-05-07 DIAGNOSIS — H353132 Nonexudative age-related macular degeneration, bilateral, intermediate dry stage: Secondary | ICD-10-CM | POA: Diagnosis not present

## 2021-05-07 DIAGNOSIS — H524 Presbyopia: Secondary | ICD-10-CM | POA: Diagnosis not present

## 2021-05-07 DIAGNOSIS — H04123 Dry eye syndrome of bilateral lacrimal glands: Secondary | ICD-10-CM | POA: Diagnosis not present

## 2021-05-07 DIAGNOSIS — C3490 Malignant neoplasm of unspecified part of unspecified bronchus or lung: Secondary | ICD-10-CM

## 2021-05-08 ENCOUNTER — Encounter (INDEPENDENT_AMBULATORY_CARE_PROVIDER_SITE_OTHER): Payer: Self-pay | Admitting: Vascular Surgery

## 2021-05-08 ENCOUNTER — Ambulatory Visit (INDEPENDENT_AMBULATORY_CARE_PROVIDER_SITE_OTHER): Payer: Medicare HMO | Admitting: Vascular Surgery

## 2021-05-08 ENCOUNTER — Other Ambulatory Visit: Payer: Self-pay

## 2021-05-08 VITALS — BP 133/74 | HR 81 | Resp 16 | Wt 173.0 lb

## 2021-05-08 DIAGNOSIS — I10 Essential (primary) hypertension: Secondary | ICD-10-CM | POA: Diagnosis not present

## 2021-05-08 DIAGNOSIS — I824Y2 Acute embolism and thrombosis of unspecified deep veins of left proximal lower extremity: Secondary | ICD-10-CM

## 2021-05-08 DIAGNOSIS — L97221 Non-pressure chronic ulcer of left calf limited to breakdown of skin: Secondary | ICD-10-CM | POA: Insufficient documentation

## 2021-05-08 DIAGNOSIS — I890 Lymphedema, not elsewhere classified: Secondary | ICD-10-CM

## 2021-05-08 DIAGNOSIS — M7989 Other specified soft tissue disorders: Secondary | ICD-10-CM

## 2021-05-08 NOTE — Assessment & Plan Note (Signed)
One of the reasons for the worsening left lower extremity swelling.  Nearly completed her therapy for coagulation.

## 2021-05-08 NOTE — Assessment & Plan Note (Signed)
Worse 

## 2021-05-08 NOTE — Assessment & Plan Note (Signed)
blood pressure control important in reducing the progression of atherosclerotic disease. On appropriate oral medications.  

## 2021-05-08 NOTE — Assessment & Plan Note (Signed)
A 3 layer Unna boot was placed today and will be changed weekly.  We will check a venous reflux study in a few weeks in follow-up.

## 2021-05-08 NOTE — Assessment & Plan Note (Signed)
Swelling is under suboptimal control particular in the left leg.  The Unna boot should hopefully help this.  Once we do another venous duplex and if there is no active DVT, we can get back in the lymphedema pumps as well.

## 2021-05-08 NOTE — Progress Notes (Signed)
MRN : [id number]  Lindsey Thomas is a 80 y.o. (1940/09/21) female who presents with chief complaint of  Chief Complaint  Patient presents with   Follow-up    52yr follow up  .  History of Present Illness: Patient returns today in follow up of her lymphedema with a new ulceration on the left lower leg.  This has been present for about 2 to 3 months now.  This started after mild local trauma.  No fevers or chills.  She had Unna boots on it first but has just had a dressing with her compression socks recently.  The wound is somewhat tender but not terribly so.  She has swelling in both legs but worse on the left than the right.  She reports having had a DVT in this leg earlier this year.  She has not been using her lymphedema pump for that reason.  Current Outpatient Medications  Medication Sig Dispense Refill   butalbital-acetaminophen-caffeine (FIORICET) 50-325-40 MG tablet Take 1-2 tablets by mouth every 6 (six) hours as needed for headache. 12 tablet 0   butalbital-acetaminophen-caffeine (FIORICET) 50-325-40 MG tablet Take by mouth 2 (two) times daily as needed for headache.     cetirizine (ZYRTEC) 10 MG tablet Take 10 mg by mouth at bedtime.      Cholecalciferol (VITAMIN D PO) Take 5,000 Units by mouth daily.     Cyanocobalamin (VITAMIN B-12 PO) Take 2 tablets by mouth daily.     fluticasone (FLONASE) 50 MCG/ACT nasal spray Place 2 sprays into both nostrils daily.     ibandronate (BONIVA) 150 MG tablet 150 mg.     loratadine (CLARITIN) 10 MG tablet Take 10 mg by mouth daily.     methocarbamol (ROBAXIN) 500 MG tablet Take 1 tablet (500 mg total) by mouth 4 (four) times daily. 16 tablet 0   montelukast (SINGULAIR) 10 MG tablet Take 10 mg by mouth at bedtime.      Multiple Vitamins-Minerals (PRESERVISION AREDS PO) Take 1 capsule by mouth 2 (two) times daily.     omeprazole (PRILOSEC) 20 MG capsule Take 20 mg by mouth 2 (two) times daily before a meal.     predniSONE (DELTASONE) 50 MG  tablet Take 1 tablet (50 mg total) by mouth daily with breakfast. 5 tablet 0   RIVAROXABAN (XARELTO) VTE STARTER PACK (15 & 20 MG) Follow package directions: Take one 15mg  tablet by mouth twice a day. On day 22, switch to one 20mg  tablet once a day. Take with food. 51 each 0   traMADol (ULTRAM) 50 MG tablet Take 1 tablet (50 mg total) by mouth every 6 (six) hours as needed. 15 tablet 0   TRELEGY ELLIPTA 100-62.5-25 MCG/INH AEPB Inhale 1 puff into the lungs daily.     Turmeric 500 MG TABS Take 1 tablet by mouth 1 day or 1 dose.     VENTOLIN HFA 108 (90 Base) MCG/ACT inhaler Inhale 2 puffs into the lungs as needed.     aspirin EC 81 MG tablet Take 81 mg by mouth daily. (Patient not taking: Reported on 05/08/2021)     furosemide (LASIX) 20 MG tablet Take by mouth.      No current facility-administered medications for this visit.    Past Medical History:  Diagnosis Date   Anxiety    Arthritis    Asthma    DVT (deep venous thrombosis) ([place])    Edema    feet   Emphysema of lung ([place])  GERD (gastroesophageal reflux disease)    Headache    MIGRAINES   History of orthopnea    Hypercholesterolemia    Hypothyroidism    NODULES   Lymphedema    Migraine    Non-small cell lung cancer, right ([place]) 05/2019   Rad tx's   Shortness of breath dyspnea    WITH EXERTION   Sleep apnea    MILD, DOES NOT USE CPAP   Thyroid nodule     Past Surgical History:  Procedure Laterality Date   ABDOMINAL HYSTERECTOMY     BREAST BIOPSY     CARDIAC CATHETERIZATION     CATARACT EXTRACTION W/PHACO Left 02/12/2016   Procedure: CATARACT EXTRACTION PHACO AND INTRAOCULAR LENS PLACEMENT ([place]);  Surgeon: Eulogio Bear, MD;  Location: [facility] ORS;  Service: Ophthalmology;  Laterality: Left;  [place] 01:30 AP% 15.2 CDE 13.71 Fluid pack lot # [id number]   CATARACT EXTRACTION W/PHACO Right 03/18/2016   Procedure: CATARACT EXTRACTION PHACO AND INTRAOCULAR LENS PLACEMENT ([facility]);  Surgeon: Eulogio Bear, MD;  Location:  [facility] OR[facility];  Service: Ophthalmology;  Laterality: Right;  Lot # [id number] H [place]: 01:05.5 AP%:14.3 CDE: 9.33   FRACTURE SURGERY Left    arm rod and screw   KNEE ARTHROSCOPY     OOPHORECTOMY     RCR     ROTATOR CUFF REPAIR Left 2006     Social History   Tobacco Use   Smoking status: Never   Smokeless tobacco: Never  Substance Use Topics   Alcohol use: No   Drug use: Never       Family History  Problem Relation Age of Onset   Emphysema Mother    Heart Problems Mother    Tuberculosis Father    Brain cancer Father    Skin cancer Father    Breast cancer Sister    Irritable bowel syndrome Sister    Lung cancer Brother    Dementia Sister    Schizophrenia Sister      Allergies  Allergen Reactions   Augmentin [Amoxicillin-Pot Clavulanate]    Celecoxib    Eryc [Erythromycin]    Percocet [Oxycodone-Acetaminophen]      REVIEW OF SYSTEMS (Negative unless checked)  Constitutional: [] Weight loss  [] Fever  [] Chills Cardiac: [] Chest pain   [] Chest pressure   [] Palpitations   [] Shortness of breath when laying flat   [] Shortness of breath at rest   [] Shortness of breath with exertion. Vascular:  [] Pain in legs with walking   [] Pain in legs at rest   [] Pain in legs when laying flat   [] Claudication   [] Pain in feet when walking  [] Pain in feet at rest  [] Pain in feet when laying flat   [] History of DVT   [] Phlebitis   [x] Swelling in legs   [] Varicose veins   [x] Non-healing ulcers Pulmonary:   [] Uses home oxygen   [] Productive cough   [] Hemoptysis   [] Wheeze  [] COPD   [] Asthma Neurologic:  [] Dizziness  [] Blackouts   [] Seizures   [] History of stroke   [] History of TIA  [] Aphasia   [] Temporary blindness   [] Dysphagia   [] Weakness or numbness in arms   [] Weakness or numbness in legs Musculoskeletal:  [x] Arthritis   [] Joint swelling   [x] Joint pain   [] Low back pain Hematologic:  [] Easy bruising  [] Easy bleeding   [] Hypercoagulable state   [] Anemic   Gastrointestinal:  [] Blood in stool    [] Vomiting blood  [] Gastroesophageal reflux/heartburn   [] Abdominal pain Genitourinary:  [] Chronic kidney disease   [] Difficult urination  []   Frequent urination  [] Burning with urination   [] Hematuria Skin:  [] Rashes   [x] Ulcers   [x] Wounds Psychological:  [] History of anxiety   []  History of major depression.  Physical Examination  BP 133/74 (BP Location: Left Arm)   Pulse 81   Resp 16   Wt 173 lb (78.5 kg)   BMI 36.79 kg/m  Gen:  WD/WN, NAD Head: [place]/AT, No temporalis wasting. Ear/Nose/Throat: Hearing grossly intact, nares w/o erythema or drainage Eyes: Conjunctiva clear. Sclera non-icteric Neck: Supple.  Trachea midline Pulmonary:  Good air movement, no use of accessory muscles.  Cardiac: RRR, no JVD Vascular:  Vessel Right Left  Radial Palpable Palpable                   Musculoskeletal: M/S 5/5 throughout.  No deformity or atrophy.  Small less than a centimeter circular ulceration on the left lateral lower leg.  1+ right lower extremity edema, 2+ left lower extremity edema. Neurologic: Sensation grossly intact in extremities.  Symmetrical.  Speech is fluent.  Psychiatric: Judgment intact, Mood & affect appropriate for pt's clinical situation. Dermatologic: Left lower extremity wound as described above      Labs No results found for this or any previous visit (from the past 2160 hour(s)).  Radiology No results found.  Assessment/Plan  Lower limb ulcer, calf, left, limited to breakdown of skin (HCC) A 3 layer Unna boot was placed today and will be changed weekly.  We will check a venous reflux study in a few weeks in follow-up.  Lymphedema Swelling is under suboptimal control particular in the left leg.  The Unna boot should hopefully help this.  Once we do another venous duplex and if there is no active DVT, we can get back in the lymphedema pumps as well.  Leg swelling Worse  Hypertension blood pressure control important in reducing the progression of  atherosclerotic disease. On appropriate oral medications.   Acute deep vein thrombosis (DVT) of proximal vein of left lower extremity (HCC) One of the reasons for the worsening left lower extremity swelling.  Nearly completed her therapy for coagulation.    Leotis Pain, MD  05/08/2021 3:35 PM    This note was created with Dragon medical transcription system.  Any errors from dictation are purely unintentional

## 2021-05-12 ENCOUNTER — Inpatient Hospital Stay: Payer: Medicare HMO | Attending: Oncology

## 2021-05-12 ENCOUNTER — Other Ambulatory Visit: Payer: Self-pay

## 2021-05-12 ENCOUNTER — Ambulatory Visit
Admission: RE | Admit: 2021-05-12 | Discharge: 2021-05-12 | Disposition: A | Discharge status: Home / Self Care | Payer: Medicare HMO | Source: Ambulatory Visit | Attending: Oncology | Admitting: Oncology

## 2021-05-12 DIAGNOSIS — E039 Hypothyroidism, unspecified: Secondary | ICD-10-CM | POA: Insufficient documentation

## 2021-05-12 DIAGNOSIS — C3490 Malignant neoplasm of unspecified part of unspecified bronchus or lung: Secondary | ICD-10-CM | POA: Diagnosis not present

## 2021-05-12 DIAGNOSIS — Z08 Encounter for follow-up examination after completed treatment for malignant neoplasm: Secondary | ICD-10-CM | POA: Insufficient documentation

## 2021-05-12 DIAGNOSIS — Z85118 Personal history of other malignant neoplasm of bronchus and lung: Secondary | ICD-10-CM | POA: Insufficient documentation

## 2021-05-12 DIAGNOSIS — R918 Other nonspecific abnormal finding of lung field: Secondary | ICD-10-CM | POA: Insufficient documentation

## 2021-05-12 LAB — CBC WITH DIFFERENTIAL/PLATELET
Abs Immature Granulocytes: 0.01 10*3/uL (ref 0.00–0.07)
Basophils Absolute: 0 10*3/uL (ref 0.0–0.1)
Basophils Relative: 0 %
Eosinophils Absolute: 0.1 10*3/uL (ref 0.0–0.5)
Eosinophils Relative: 2 %
HCT: 40.8 % (ref 36.0–46.0)
Hemoglobin: 13.5 g/dL (ref 12.0–15.0)
Immature Granulocytes: 0 %
Lymphocytes Relative: 13 %
Lymphs Abs: 0.7 10*3/uL (ref 0.7–4.0)
MCH: 30.3 pg (ref 26.0–34.0)
MCHC: 33.1 g/dL (ref 30.0–36.0)
MCV: 91.7 fL (ref 80.0–100.0)
Monocytes Absolute: 0.4 10*3/uL (ref 0.1–1.0)
Monocytes Relative: 8 %
Neutro Abs: 4 10*3/uL (ref 1.7–7.7)
Neutrophils Relative %: 77 %
Platelets: 237 10*3/uL (ref 150–400)
RBC: 4.45 MIL/uL (ref 3.87–5.11)
RDW: 14.9 % (ref 11.5–15.5)
WBC: 5.3 10*3/uL (ref 4.0–10.5)
nRBC: 0 % (ref 0.0–0.2)

## 2021-05-12 LAB — COMPREHENSIVE METABOLIC PANEL
ALT: 15 U/L (ref 0–44)
AST: 22 U/L (ref 15–41)
Albumin: 4.1 g/dL (ref 3.5–5.0)
Alkaline Phosphatase: 105 U/L (ref 38–126)
Anion gap: 10 (ref 5–15)
BUN: 14 mg/dL (ref 8–23)
CO2: 28 mmol/L (ref 22–32)
Calcium: 8.9 mg/dL (ref 8.9–10.3)
Chloride: 95 mmol/L — ABNORMAL LOW (ref 98–111)
Creatinine, Ser: 0.58 mg/dL (ref 0.44–1.00)
GFR, Estimated: >60 mL/min (ref >60)
Glucose, Bld: 94 mg/dL (ref 70–99)
Potassium: 4.3 mmol/L (ref 3.5–5.1)
Sodium: 133 mmol/L — ABNORMAL LOW (ref 135–145)
Total Bilirubin: 0.6 mg/dL (ref 0.3–1.2)
Total Protein: 6.8 g/dL (ref 6.5–8.1)

## 2021-05-14 DIAGNOSIS — E7800 Pure hypercholesterolemia, unspecified: Secondary | ICD-10-CM | POA: Diagnosis not present

## 2021-05-14 DIAGNOSIS — M25561 Pain in right knee: Secondary | ICD-10-CM | POA: Diagnosis not present

## 2021-05-14 DIAGNOSIS — Z23 Encounter for immunization: Secondary | ICD-10-CM | POA: Diagnosis not present

## 2021-05-14 DIAGNOSIS — C3400 Malignant neoplasm of unspecified main bronchus: Secondary | ICD-10-CM | POA: Diagnosis not present

## 2021-05-14 DIAGNOSIS — I878 Other specified disorders of veins: Secondary | ICD-10-CM | POA: Diagnosis not present

## 2021-05-14 DIAGNOSIS — Z79899 Other long term (current) drug therapy: Secondary | ICD-10-CM | POA: Diagnosis not present

## 2021-05-14 DIAGNOSIS — I824Y2 Acute embolism and thrombosis of unspecified deep veins of left proximal lower extremity: Secondary | ICD-10-CM | POA: Diagnosis not present

## 2021-05-15 ENCOUNTER — Encounter (INDEPENDENT_AMBULATORY_CARE_PROVIDER_SITE_OTHER): Payer: Self-pay | Admitting: Nurse Practitioner

## 2021-05-15 ENCOUNTER — Ambulatory Visit (INDEPENDENT_AMBULATORY_CARE_PROVIDER_SITE_OTHER): Payer: Medicare HMO | Admitting: Nurse Practitioner

## 2021-05-15 ENCOUNTER — Other Ambulatory Visit: Payer: Self-pay

## 2021-05-15 VITALS — BP 136/76 | HR 84 | Resp 16 | Wt 174.0 lb

## 2021-05-15 DIAGNOSIS — L97221 Non-pressure chronic ulcer of left calf limited to breakdown of skin: Secondary | ICD-10-CM

## 2021-05-15 NOTE — Progress Notes (Signed)
History of Present Illness  There is no documented history at this time  Assessments & Plan   There are no diagnoses linked to this encounter.    Additional instructions  Subjective:  Patient presents with venous ulcer of the Left lower extremity.    Procedure:  3 layer unna wrap was placed Left lower extremity.   Plan:   Follow up in one week.  

## 2021-05-16 ENCOUNTER — Encounter (INDEPENDENT_AMBULATORY_CARE_PROVIDER_SITE_OTHER): Payer: Self-pay | Admitting: Nurse Practitioner

## 2021-05-18 DIAGNOSIS — M1711 Unilateral primary osteoarthritis, right knee: Secondary | ICD-10-CM | POA: Diagnosis not present

## 2021-05-22 ENCOUNTER — Other Ambulatory Visit: Payer: Self-pay

## 2021-05-22 ENCOUNTER — Ambulatory Visit (INDEPENDENT_AMBULATORY_CARE_PROVIDER_SITE_OTHER): Payer: Medicare HMO | Admitting: Nurse Practitioner

## 2021-05-22 VITALS — BP 144/78 | HR 78 | Ht <= 58 in | Wt 173.0 lb

## 2021-05-22 DIAGNOSIS — L97221 Non-pressure chronic ulcer of left calf limited to breakdown of skin: Secondary | ICD-10-CM | POA: Diagnosis not present

## 2021-05-22 NOTE — Progress Notes (Signed)
History of Present Illness  There is no documented history at this time  Assessments & Plan   There are no diagnoses linked to this encounter.    Additional instructions  Subjective:  Patient presents with venous ulcer of the Left lower extremity.    Procedure:  3 layer unna wrap was placed Left lower extremity.   Plan:   Follow up in one week.  

## 2021-05-28 ENCOUNTER — Ambulatory Visit (INDEPENDENT_AMBULATORY_CARE_PROVIDER_SITE_OTHER): Payer: Medicare HMO | Admitting: Nurse Practitioner

## 2021-05-28 ENCOUNTER — Other Ambulatory Visit: Payer: Self-pay

## 2021-05-28 DIAGNOSIS — L97221 Non-pressure chronic ulcer of left calf limited to breakdown of skin: Secondary | ICD-10-CM | POA: Diagnosis not present

## 2021-05-28 DIAGNOSIS — L97211 Non-pressure chronic ulcer of right calf limited to breakdown of skin: Secondary | ICD-10-CM

## 2021-05-28 NOTE — Progress Notes (Signed)
History of Present Illness  There is no documented history at this time  Assessments & Plan   There are no diagnoses linked to this encounter.    Additional instructions  Subjective:  Patient presents with venous ulcer of the Right lower extremity.    Procedure:  3 layer unna wrap was placed Right lower extremity.   Plan:   Follow up in one week.   

## 2021-05-29 ENCOUNTER — Encounter (INDEPENDENT_AMBULATORY_CARE_PROVIDER_SITE_OTHER): Payer: Self-pay | Admitting: Nurse Practitioner

## 2021-06-04 ENCOUNTER — Ambulatory Visit (INDEPENDENT_AMBULATORY_CARE_PROVIDER_SITE_OTHER): Payer: Medicare HMO | Admitting: Nurse Practitioner

## 2021-06-04 ENCOUNTER — Encounter (INDEPENDENT_AMBULATORY_CARE_PROVIDER_SITE_OTHER): Payer: Self-pay | Admitting: Nurse Practitioner

## 2021-06-04 ENCOUNTER — Other Ambulatory Visit: Payer: Self-pay

## 2021-06-04 ENCOUNTER — Ambulatory Visit (INDEPENDENT_AMBULATORY_CARE_PROVIDER_SITE_OTHER): Payer: Medicare HMO

## 2021-06-04 VITALS — BP 153/80 | HR 89 | Resp 16 | Wt 174.6 lb

## 2021-06-04 DIAGNOSIS — L97221 Non-pressure chronic ulcer of left calf limited to breakdown of skin: Secondary | ICD-10-CM | POA: Diagnosis not present

## 2021-06-04 DIAGNOSIS — I10 Essential (primary) hypertension: Secondary | ICD-10-CM

## 2021-06-05 ENCOUNTER — Encounter: Payer: Self-pay | Admitting: Oncology

## 2021-06-05 ENCOUNTER — Inpatient Hospital Stay (HOSPITAL_BASED_OUTPATIENT_CLINIC_OR_DEPARTMENT_OTHER): Payer: Medicare HMO | Admitting: Oncology

## 2021-06-05 VITALS — BP 120/61 | HR 95 | Temp 98.8°F | Resp 16 | Wt 177.0 lb

## 2021-06-05 DIAGNOSIS — Z85118 Personal history of other malignant neoplasm of bronchus and lung: Secondary | ICD-10-CM

## 2021-06-05 DIAGNOSIS — Z08 Encounter for follow-up examination after completed treatment for malignant neoplasm: Secondary | ICD-10-CM | POA: Diagnosis not present

## 2021-06-05 DIAGNOSIS — E039 Hypothyroidism, unspecified: Secondary | ICD-10-CM | POA: Diagnosis not present

## 2021-06-05 DIAGNOSIS — R918 Other nonspecific abnormal finding of lung field: Secondary | ICD-10-CM | POA: Diagnosis not present

## 2021-06-05 NOTE — Progress Notes (Signed)
Patient here for results, she would like to stop xarelto in possible.

## 2021-06-08 NOTE — Progress Notes (Signed)
Hematology/Oncology Consult note [facility]  [phone] [phone]  Patient Care Team: Idelle Crouch, MD as PCP - General (Internal Medicine) Noreene Filbert, MD as Referring Physician (Radiation Oncology) Sindy Guadeloupe, MD as Consulting Physician (Oncology) Erby Pian, MD as Referring Physician (Specialist) Telford Nab, RN as Registered Nurse   Name of the patient: Lindsey Thomas  [id number]  Sep 28, 1940   Date of visit: 06/08/21  Diagnosis- history of lung cancer s/p SBRT    Chief complaint/ Reason for visit-routine follow-up of lung cancer  Heme/Onc history: patient is a 81 year old female with a past medical history significant for COPD hypertension hyperlipidemia and hypothyroidism among other medical problems.  She presented with symptoms of shortness of breath which prompted a CT scan. CT chest on 04/17/2019 showed a spiculated peripheral right upper lobe lung lesion measuring 18 x 13 mm concerning for primary lung cancer.  No evidence of mediastinal or hilar adenopathy.  This was followed by a PET CT scan which again showed uptake with an SUV of 3.1 in the area of the right upper lobe.  Smaller subcentimeter pulmonary nodules with no significant metabolic uptake.  No metabolic adenopathy.  Patient was seen by Dr. Raul Del for possible bronchoscopy.  Patient tells me that Dr. Raul Del also got in touch with radiology who thought that CT-guided lung biopsy did carry a risk of pneumothorax and lung collapse.  biopsy was not possible safely and she underwent SBRT to RUL without biopsy  Interval history-patient is currently doing well from her lung cancer standpoint.  She has baseline fatigue which is unchanged.  Denies any significant shortness of breath  ECOG PS- 1 Pain scale- 0   Review of systems- Review of Systems  Constitutional:  Positive for malaise/fatigue. Negative for chills, fever and weight loss.  HENT:   Negative for congestion, ear discharge and nosebleeds.   Eyes:  Negative for blurred vision.  Respiratory:  Negative for cough, hemoptysis, sputum production, shortness of breath and wheezing.   Cardiovascular:  Negative for chest pain, palpitations, orthopnea and claudication.  Gastrointestinal:  Negative for abdominal pain, blood in stool, constipation, diarrhea, heartburn, melena, nausea and vomiting.  Genitourinary:  Negative for dysuria, flank pain, frequency, hematuria and urgency.  Musculoskeletal:  Negative for back pain, joint pain and myalgias.  Skin:  Negative for rash.  Neurological:  Negative for dizziness, tingling, focal weakness, seizures, weakness and headaches.  Endo/Heme/Allergies:  Does not bruise/bleed easily.  Psychiatric/Behavioral:  Negative for depression and suicidal ideas. The patient does not have insomnia.       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   Rad tx's   Shortness of breath dyspnea    WITH EXERTION   Sleep apnea    MILD, DOES NOT USE CPAP   Thyroid nodule      Past Surgical History:  Procedure Laterality Date   ABDOMINAL HYSTERECTOMY     BREAST BIOPSY     CARDIAC CATHETERIZATION     CATARACT EXTRACTION W/PHACO Left 02/12/2016   Procedure: CATARACT EXTRACTION PHACO AND INTRAOCULAR LENS PLACEMENT ([place]);  Surgeon: Eulogio Bear, MD;  Location: [facility] ORS;  Service: Ophthalmology;  Laterality: Left;  [place] 01:30 AP% 15.2 CDE 13.71 Fluid pack lot #  [id number]   CATARACT EXTRACTION W/PHACO Right 03/18/2016   Procedure: CATARACT EXTRACTION PHACO AND INTRAOCULAR LENS PLACEMENT ([facility]);  Surgeon: Eulogio Bear, MD;  Location: [facility] ORS;  Service: Ophthalmology;  Laterality: Right;  Lot # [id number] H [place]: 01:05.5 AP%:14.3 CDE: 9.33   FRACTURE SURGERY Left    arm rod and screw   KNEE ARTHROSCOPY     OOPHORECTOMY     RCR     ROTATOR CUFF REPAIR Left 2006    Social History   Socioeconomic History   Marital status: Married    Spouse name: Not on file   Number of children: Not on file   Years of education: Not on file   Highest education level: Not on file  Occupational History   Not on file  Tobacco Use   Smoking status: Never   Smokeless tobacco: Never  Substance and Sexual Activity   Alcohol use: No   Drug use: Never   Sexual activity: Not on file  Other Topics Concern   Not on file  Social History Narrative   Not on file   Social Determinants of Health   Financial Resource Strain: Not on file  Food Insecurity: Not on file  Transportation Needs: Not on file  Physical Activity: Not on file  Stress: Not on file  Social Connections: Not on file  Intimate Partner Violence: Not on file    Family History  Problem Relation Age of Onset   Emphysema Mother    Heart Problems Mother    Tuberculosis Father    Brain cancer Father    Skin cancer Father    Breast cancer Sister    Irritable bowel syndrome Sister    Lung cancer Brother    Dementia Sister    Schizophrenia Sister      Current Outpatient Medications:    aspirin EC 81 MG tablet, Take 81 mg by mouth daily., Disp: , Rfl:    butalbital-acetaminophen-caffeine (FIORICET) 50-325-40 MG tablet, Take 1-2 tablets by mouth every 6 (six) hours as needed for headache., Disp: 12 tablet, Rfl: 0   butalbital-acetaminophen-caffeine (FIORICET) 50-325-40 MG tablet, Take by mouth 2 (two) times daily as needed for headache., Disp: , Rfl:    cetirizine (ZYRTEC) 10 MG tablet, Take 10  mg by mouth at bedtime. , Disp: , Rfl:    Cholecalciferol (VITAMIN D PO), Take 5,000 Units by mouth daily., Disp: , Rfl:    Cyanocobalamin (VITAMIN B-12 PO), Take 2 tablets by mouth daily., Disp: , Rfl:    fluticasone (FLONASE) 50 MCG/ACT nasal spray, Place 2 sprays into both nostrils daily., Disp: , Rfl:    furosemide (LASIX) 20 MG tablet, Take by mouth. , Disp: , Rfl:    ibandronate (BONIVA) 150 MG tablet, 150 mg., Disp: , Rfl:    loratadine (CLARITIN) 10 MG tablet, Take 10 mg by mouth daily., Disp: , Rfl:    Loratadine 10 MG CAPS, Take 1 capsule by mouth daily., Disp: , Rfl:    montelukast (SINGULAIR) 10 MG tablet, Take 10 mg by mouth at bedtime. , Disp: , Rfl:    Multiple Vitamins-Minerals (PRESERVISION AREDS  PO), Take 1 capsule by mouth 2 (two) times daily., Disp: , Rfl:    omeprazole (PRILOSEC) 20 MG capsule, Take 20 mg by mouth 2 (two) times daily before a meal., Disp: , Rfl:    RIVAROXABAN (XARELTO) VTE STARTER PACK (15 & 20 MG), Follow package directions: Take one 15mg  tablet by mouth twice a day. On day 22, switch to one 20mg  tablet once a day. Take with food., Disp: 51 each, Rfl: 0   traMADol (ULTRAM) 50 MG tablet, Take 1 tablet (50 mg total) by mouth every 6 (six) hours as needed., Disp: 15 tablet, Rfl: 0   Turmeric 500 MG TABS, Take 1 tablet by mouth 1 day or 1 dose. (Patient not taking: Reported on 06/04/2021), Disp: , Rfl:    VENTOLIN HFA 108 (90 Base) MCG/ACT inhaler, Inhale 2 puffs into the lungs as needed., Disp: , Rfl:   Physical exam:  Vitals:   06/05/21 1503  BP: 120/61  Pulse: 95  Resp: 16  Temp: 98.8 F (37.1 C)  TempSrc: Tympanic  SpO2: 95%  Weight: 177 lb (80.3 kg)   Physical Exam Constitutional:      General: She is not in acute distress. Cardiovascular:     Rate and Rhythm: Normal rate and regular rhythm.     Heart sounds: Normal heart sounds.  Pulmonary:     Effort: Pulmonary effort is normal.     Breath sounds: Normal breath sounds.  Abdominal:      General: Bowel sounds are normal.     Palpations: Abdomen is soft.  Skin:    General: Skin is warm and dry.  Neurological:     Mental Status: She is alert and oriented to person, place, and time.     CMP Latest Ref Rng & Units 05/12/2021  Glucose 70 - 99 mg/dL 94  BUN 8 - 23 mg/dL 14  Creatinine 0.44 - 1.00 mg/dL 0.58  Sodium 135 - 145 mmol/L 133(L)  Potassium 3.5 - 5.1 mmol/L 4.3  Chloride 98 - 111 mmol/L 95(L)  CO2 22 - 32 mmol/L 28  Calcium 8.9 - 10.3 mg/dL 8.9  Total Protein 6.5 - 8.1 g/dL 6.8  Total Bilirubin 0.3 - 1.2 mg/dL 0.6  Alkaline Phos 38 - 126 U/L 105  AST 15 - 41 U/L 22  ALT 0 - 44 U/L 15   CBC Latest Ref Rng & Units 05/12/2021  WBC 4.0 - 10.5 K/uL 5.3  Hemoglobin 12.0 - 15.0 g/dL 13.5  Hematocrit 36.0 - 46.0 % 40.8  Platelets 150 - 400 K/uL 237    No images are attached to the encounter.  CT Chest Wo Contrast  Result Date: 05/13/2021 CLINICAL DATA:  Lung cancer, radiation therapy completed February 2021. COVID infection September 2022. EXAM: CT CHEST WITHOUT CONTRAST TECHNIQUE: Multidetector CT imaging of the chest was performed following the standard protocol without IV contrast. COMPARISON:  11/29/2020. FINDINGS: Cardiovascular: Atherosclerotic calcification of the aorta and coronary arteries. Heart is at the upper limits of normal in size. No pericardial effusion. Mediastinum/Nodes: 2.3 cm low-attenuation left thyroid nodule, similar. No pathologically enlarged mediastinal or axillary lymph nodes. Hilar regions are difficult to evaluate without IV contrast. Esophagus is grossly unremarkable. Lungs/Pleura: Image quality is degraded by respiratory motion. Platelike scarring along the minor fissure. 3 mm right middle lobe nodule (3/63) appears new. Other scattered pulmonary nodules measure up to 4 mm in the left upper lobe (3/37). No pleural fluid. Airway is unremarkable. Upper Abdomen: Visualized portions of the liver, gallbladder, adrenal  glands, kidneys, spleen,  pancreas, stomach and bowel are unremarkable. 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 Musculoskeletal: Degenerative changes in the spine. No worrisome lytic or sclerotic lesions. IMPRESSION: 1. Possible new 3 mm right middle lobe nodule. Recommend attention on follow-up. Other scattered pulmonary nodules are unchanged. Post treatment scarring along the minor fissure. 2. 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 3. 2.3 cm low-attenuation left thyroid nodule. Recommend thyroid ultrasound. (Ref: J Am Coll Radiol. 2015 Feb;12(2): 143-50). 4. Aortic atherosclerosis (ICD10-I70.0). Coronary artery calcification. Electronically Signed   By: Lorin Picket M.D.   On: 05/13/2021 13:17   VAS [place] LOWER EXTREMITY VENOUS REFLUX  Result Date: 06/05/2021  Lower Venous Reflux Study Patient Name:  CHAUNTELLE AZPEITIA  Date of Exam:   06/04/2021 Medical Rec #: [id number]         Accession #:    [id number] Date of Birth: [birth year]         Patient Gender: F Patient Age:   84 years Exam Location:  [facility] Procedure:      VAS [place] LOWER EXTREMITY VENOUS REFLUX Referring Phys: Leotis Pain --------------------------------------------------------------------------------  Indications: Swelling.  Performing Technologist: Almira Coaster RVS  Examination Guidelines: A complete evaluation includes B-mode imaging, spectral Doppler, color Doppler, and power Doppler as needed of all accessible portions of each vessel. Bilateral testing is considered an integral part of a complete examination. Limited examinations for reoccurring indications may be performed as noted. The reflux portion of the exam is performed with the patient in reverse Trendelenburg. Significant venous reflux is defined as >500 ms in the superficial venous system, and >1 second in the deep venous system.  +--------------+---------+------+-----------+------------+--------+  LEFT           Reflux No Reflux Reflux Time Diameter cms Comments                              Yes                                      +--------------+---------+------+-----------+------------+--------+  CFV            no                                                  +--------------+---------+------+-----------+------------+--------+  FV prox        no                                                  +--------------+---------+------+-----------+------------+--------+  FV mid         no                                                  +--------------+---------+------+-----------+------------+--------+  FV dist        no                                                  +--------------+---------+------+-----------+------------+--------+  Popliteal      no                                                  +--------------+---------+------+-----------+------------+--------+  GSV at SFJ     no                               .65                +--------------+---------+------+-----------+------------+--------+  GSV prox thigh no                               .26                +--------------+---------+------+-----------+------------+--------+  GSV mid thigh  no                               .27                +--------------+---------+------+-----------+------------+--------+  GSV dist thigh no                                        NWV       +--------------+---------+------+-----------+------------+--------+  GSV at knee    no                                        NWV       +--------------+---------+------+-----------+------------+--------+  GSV prox calf  no                                        NWV       +--------------+---------+------+-----------+------------+--------+  SSV Pop Fossa  no                               .48                +--------------+---------+------+-----------+------------+--------+   Summary: Left: - No evidence of deep vein thrombosis seen in the left lower extremity, from the common femoral through the popliteal veins. - No evidence of superficial venous  thrombosis in the left lower extremity. - There is no evidence of venous reflux seen in the left lower extremity. - No evidence of superficial venous reflux seen in the left short saphenous vein.  *See table(s) above for measurements and observations. Electronically signed by Leotis Pain MD on 06/05/2021 at 10:03:25 AM.    Final      Assessment and plan- Patient is a 81 y.o. female With history of stage I lung cancer s/p SBRT here to discuss CT scan results and further management  CT chest without contrast done on 05/12/2021 does not show any overt evidence of progressive or recurrent disease.  There was a 3 mm right middle lobe lung nodule noted that was possibly new.  Given patient's age and the fact that this is a small subcentimeter  lung nodule I am inclined to monitor this conservatively and I will see her back in 6 months with a repeat CT scan   Visit Diagnosis 1. Encounter for follow-up surveillance of lung cancer      Dr. Randa Evens, MD, MPH [facility] [phone] 06/08/2021 7:34 PM

## 2021-06-14 ENCOUNTER — Encounter (INDEPENDENT_AMBULATORY_CARE_PROVIDER_SITE_OTHER): Payer: Self-pay | Admitting: Nurse Practitioner

## 2021-06-14 NOTE — Progress Notes (Signed)
Subjective:    Patient ID: Lindsey Thomas, female    DOB: [birth year], 81 y.o.   MRN: [id number] Chief Complaint  Patient presents with   Follow-up    Ultrasound follow up    The patient returns to the office for followup evaluation regarding leg swelling.  The swelling has improved quite a bit and the pain associated with swelling has decreased substantially.  The previous wounds have healed well.  Since the previous visit the patient has been wearing [place] wraps and has noted little significant improvement in the lymphedema. The patient has been using compression routinely morning until night.  The patient also states elevation during the day and exercise is being done too.        Review of Systems  Cardiovascular:  Positive for leg swelling.  Skin:  Negative for wound.  All other systems reviewed and are negative.     Objective:   Physical Exam Vitals reviewed.  HENT:     Head: Normocephalic.  Cardiovascular:     Rate and Rhythm: Normal rate.  Pulmonary:     Effort: Pulmonary effort is normal.  Musculoskeletal:     Right lower leg: 1+ Edema present.     Left lower leg: 1+ Edema present.  Skin:    General: Skin is warm and dry.  Neurological:     Mental Status: She is alert and oriented to person, place, and time.  Psychiatric:        Mood and Affect: Mood normal.        Behavior: Behavior normal.        Thought Content: Thought content normal.        Judgment: Judgment normal.    BP (!) 153/80 (BP Location: Left Arm)    Pulse 89    Resp 16    Wt 174 lb 9.6 oz (79.2 kg)    BMI 37.78 kg/m   Past Medical History:  Diagnosis Date   Anxiety    Arthritis    Asthma    DVT (deep venous thrombosis) (HCC)    Edema    feet   Emphysema of lung (HCC)    GERD (gastroesophageal reflux disease)    Headache    MIGRAINES   History of orthopnea    Hypercholesterolemia    Hypothyroidism    NODULES   Lymphedema    Migraine    Non-small cell lung cancer, right  ([place]) 05/2019   Rad tx's   Shortness of breath dyspnea    WITH EXERTION   Sleep apnea    MILD, DOES NOT USE CPAP   Thyroid nodule     Social History   Socioeconomic History   Marital status: Married    Spouse name: Not on file   Number of children: Not on file   Years of education: Not on file   Highest education level: Not on file  Occupational History   Not on file  Tobacco Use   Smoking status: Never   Smokeless tobacco: Never  Substance and Sexual Activity   Alcohol use: No   Drug use: Never   Sexual activity: Not on file  Other Topics Concern   Not on file  Social History Narrative   Not on file   Social Determinants of Health   Financial Resource Strain: Not on file  Food Insecurity: Not on file  Transportation Needs: Not on file  Physical Activity: Not on file  Stress: Not on file  Social Connections: Not on file  Intimate Partner Violence: Not on file    Past Surgical History:  Procedure Laterality Date   ABDOMINAL HYSTERECTOMY     BREAST BIOPSY     CARDIAC CATHETERIZATION     CATARACT EXTRACTION W/PHACO Left 02/12/2016   Procedure: CATARACT EXTRACTION PHACO AND INTRAOCULAR LENS PLACEMENT ([facility]);  Surgeon: Eulogio Bear, MD;  Location: [facility] ORS;  Service: Ophthalmology;  Laterality: Left;  [place] 01:30 AP% 15.2 CDE 13.71 Fluid pack lot # [id number]   CATARACT EXTRACTION W/PHACO Right 03/18/2016   Procedure: CATARACT EXTRACTION PHACO AND INTRAOCULAR LENS PLACEMENT ([facility]);  Surgeon: Eulogio Bear, MD;  Location: [facility] ORS;  Service: Ophthalmology;  Laterality: Right;  Lot # [id number] H [place]: 01:05.5 AP%:14.3 CDE: 9.33   FRACTURE SURGERY Left    arm rod and screw   KNEE ARTHROSCOPY     OOPHORECTOMY     RCR     ROTATOR CUFF REPAIR Left 2006    Family History  Problem Relation Age of Onset   Emphysema Mother    Heart Problems Mother    Tuberculosis Father    Brain cancer Father    Skin cancer Father    Breast cancer Sister    Irritable bowel  syndrome Sister    Lung cancer Brother    Dementia Sister    Schizophrenia Sister     Allergies  Allergen Reactions   Augmentin [Amoxicillin-Pot Clavulanate]    Celecoxib    Eryc [Erythromycin]    Percocet [Oxycodone-Acetaminophen]     CBC Latest Ref Rng & Units 05/12/2021 11/29/2020 07/25/2020  WBC 4.0 - 10.5 K/uL 5.3 6.3 5.1  Hemoglobin 12.0 - 15.0 g/dL 13.5 13.1 13.7  Hematocrit 36.0 - 46.0 % 40.8 39.5 39.8  Platelets 150 - 400 K/uL 237 244 219      CMP     Component Value Date/Time   NA 133 (L) 05/12/2021 1018   NA 136 10/16/2012 1600   K 4.3 05/12/2021 1018   K 3.6 10/16/2012 1600   CL 95 (L) 05/12/2021 1018   CL 107 10/16/2012 1600   CO2 28 05/12/2021 1018   CO2 23 10/16/2012 1600   GLUCOSE 94 05/12/2021 1018   GLUCOSE 111 (H) 10/16/2012 1600   BUN 14 05/12/2021 1018   BUN 14 10/16/2012 1600   CREATININE 0.58 05/12/2021 1018   CREATININE 0.85 10/16/2012 1600   CALCIUM 8.9 05/12/2021 1018   CALCIUM 8.6 10/16/2012 1600   PROT 6.8 05/12/2021 1018   PROT 6.4 10/16/2012 1600   ALBUMIN 4.1 05/12/2021 1018   ALBUMIN 3.4 10/16/2012 1600   AST 22 05/12/2021 1018   AST 25 10/16/2012 1600   ALT 15 05/12/2021 1018   ALT 20 10/16/2012 1600   ALKPHOS 105 05/12/2021 1018   ALKPHOS 111 10/16/2012 1600   BILITOT 0.6 05/12/2021 1018   BILITOT 0.4 10/16/2012 1600   GFRNONAA >60 05/12/2021 1018   GFRNONAA >60 10/16/2012 1600   GFRAA >60 07/25/2019 1020   GFRAA >60 10/16/2012 1600     No results found.     Assessment & Plan:   1. Lower limb ulcer, calf, left, limited to breakdown of skin ([place]) The patient has been in [place] wraps for the last several weeks and the swelling is under much better control and all wounds have healed.  Patient is advised to utilize compression on a daily basis.  She should also elevate her lower extremities is much as possible in addition to being active when possible.  We will have the patient return  in 3 months to evaluate progress with  lower extremities swelling.  Patient is advised that if she begins to develop wounds or ulcerations to follow-up sooner.   2. Primary hypertension Continue antihypertensive medications as already ordered, these medications have been reviewed and there are no changes at this time.    Current Outpatient Medications on File Prior to Visit  Medication Sig Dispense Refill   butalbital-acetaminophen-caffeine (FIORICET) 50-325-40 MG tablet Take 1-2 tablets by mouth every 6 (six) hours as needed for headache. 12 tablet 0   butalbital-acetaminophen-caffeine (FIORICET) 50-325-40 MG tablet Take by mouth 2 (two) times daily as needed for headache.     cetirizine (ZYRTEC) 10 MG tablet Take 10 mg by mouth at bedtime.      Cholecalciferol (VITAMIN D PO) Take 5,000 Units by mouth daily.     Cyanocobalamin (VITAMIN B-12 PO) Take 2 tablets by mouth daily.     fluticasone (FLONASE) 50 MCG/ACT nasal spray Place 2 sprays into both nostrils daily.     ibandronate (BONIVA) 150 MG tablet 150 mg.     loratadine (CLARITIN) 10 MG tablet Take 10 mg by mouth daily.     Loratadine 10 MG CAPS Take 1 capsule by mouth daily.     montelukast (SINGULAIR) 10 MG tablet Take 10 mg by mouth at bedtime.      Multiple Vitamins-Minerals (PRESERVISION AREDS PO) Take 1 capsule by mouth 2 (two) times daily.     omeprazole (PRILOSEC) 20 MG capsule Take 20 mg by mouth 2 (two) times daily before a meal.     RIVAROXABAN (XARELTO) VTE STARTER PACK (15 & 20 MG) Follow package directions: Take one 15mg  tablet by mouth twice a day. On day 22, switch to one 20mg  tablet once a day. Take with food. 51 each 0   VENTOLIN HFA 108 (90 Base) MCG/ACT inhaler Inhale 2 puffs into the lungs as needed.     aspirin EC 81 MG tablet Take 81 mg by mouth daily.     furosemide (LASIX) 20 MG tablet Take by mouth.      traMADol (ULTRAM) 50 MG tablet Take 1 tablet (50 mg total) by mouth every 6 (six) hours as needed. 15 tablet 0   Turmeric 500 MG TABS Take 1  tablet by mouth 1 day or 1 dose. (Patient not taking: Reported on 06/04/2021)     No current facility-administered medications on file prior to visit.    There are no Patient Instructions on file for this visit. No follow-ups on file.   Kris Hartmann, NP

## 2021-06-17 DIAGNOSIS — R918 Other nonspecific abnormal finding of lung field: Secondary | ICD-10-CM | POA: Diagnosis not present

## 2021-06-17 DIAGNOSIS — G4733 Obstructive sleep apnea (adult) (pediatric): Secondary | ICD-10-CM | POA: Diagnosis not present

## 2021-06-17 DIAGNOSIS — R0609 Other forms of dyspnea: Secondary | ICD-10-CM | POA: Diagnosis not present

## 2021-07-12 ENCOUNTER — Encounter (INDEPENDENT_AMBULATORY_CARE_PROVIDER_SITE_OTHER): Payer: Self-pay | Admitting: Nurse Practitioner

## 2021-07-16 ENCOUNTER — Encounter (INDEPENDENT_AMBULATORY_CARE_PROVIDER_SITE_OTHER): Payer: Self-pay | Admitting: Nurse Practitioner

## 2021-07-16 ENCOUNTER — Other Ambulatory Visit: Payer: Self-pay

## 2021-07-16 ENCOUNTER — Ambulatory Visit (INDEPENDENT_AMBULATORY_CARE_PROVIDER_SITE_OTHER): Payer: Medicare HMO | Admitting: Nurse Practitioner

## 2021-07-16 VITALS — BP 132/83 | HR 76 | Resp 16

## 2021-07-16 DIAGNOSIS — I890 Lymphedema, not elsewhere classified: Secondary | ICD-10-CM | POA: Diagnosis not present

## 2021-07-16 DIAGNOSIS — I10 Essential (primary) hypertension: Secondary | ICD-10-CM

## 2021-07-21 DIAGNOSIS — L570 Actinic keratosis: Secondary | ICD-10-CM | POA: Diagnosis not present

## 2021-07-21 DIAGNOSIS — L578 Other skin changes due to chronic exposure to nonionizing radiation: Secondary | ICD-10-CM | POA: Diagnosis not present

## 2021-07-21 DIAGNOSIS — Z86018 Personal history of other benign neoplasm: Secondary | ICD-10-CM | POA: Diagnosis not present

## 2021-07-21 DIAGNOSIS — Z872 Personal history of diseases of the skin and subcutaneous tissue: Secondary | ICD-10-CM | POA: Diagnosis not present

## 2021-07-26 ENCOUNTER — Encounter (INDEPENDENT_AMBULATORY_CARE_PROVIDER_SITE_OTHER): Payer: Self-pay | Admitting: Nurse Practitioner

## 2021-07-26 NOTE — Progress Notes (Signed)
Subjective:    Patient ID: Lindsey Thomas, female    DOB: [birth year], 81 y.o.   MRN: [id number] Chief Complaint  Patient presents with   Follow-up    3 month follow up    The patient returns to the office for followup evaluation regarding leg swelling.  The swelling has persisted and the pain associated with swelling continues. There have not been any interval development of a ulcerations or wounds.  Since the previous visit the patient has been wearing graduated compression stockings and has noted little if any improvement in the lymphedema. The patient has been using compression routinely morning until night.  The patient also states elevation during the day and exercise is being done too.      Review of Systems  Cardiovascular:  Positive for leg swelling.  All other systems reviewed and are negative.     Objective:   Physical Exam Vitals reviewed.  HENT:     Head: Normocephalic.  Cardiovascular:     Rate and Rhythm: Normal rate.     Pulses: Normal pulses.  Pulmonary:     Effort: Pulmonary effort is normal.  Musculoskeletal:     Right lower leg: Edema present.     Left lower leg: Edema present.  Skin:    General: Skin is warm and dry.  Neurological:     Mental Status: She is alert and oriented to person, place, and time.  Psychiatric:        Mood and Affect: Mood normal.        Behavior: Behavior normal.        Thought Content: Thought content normal.        Judgment: Judgment normal.    BP 132/83 (BP Location: Left Arm)    Pulse 76    Resp 16   Past Medical History:  Diagnosis Date   Anxiety    Arthritis    Asthma    DVT (deep venous thrombosis) (HCC)    Edema    feet   Emphysema of lung (HCC)    GERD (gastroesophageal reflux disease)    Headache    MIGRAINES   History of orthopnea    Hypercholesterolemia    Hypothyroidism    NODULES   Lymphedema    Migraine    Non-small cell lung cancer, right ([place]) 05/2019   Rad tx's   Shortness of breath  dyspnea    WITH EXERTION   Sleep apnea    MILD, DOES NOT USE CPAP   Thyroid nodule     Social History   Socioeconomic History   Marital status: Married    Spouse name: Not on file   Number of children: Not on file   Years of education: Not on file   Highest education level: Not on file  Occupational History   Not on file  Tobacco Use   Smoking status: Never   Smokeless tobacco: Never  Substance and Sexual Activity   Alcohol use: No   Drug use: Never   Sexual activity: Not on file  Other Topics Concern   Not on file  Social History Narrative   Not on file   Social Determinants of Health   Financial Resource Strain: Not on file  Food Insecurity: Not on file  Transportation Needs: Not on file  Physical Activity: Not on file  Stress: Not on file  Social Connections: Not on file  Intimate Partner Violence: Not on file    Past Surgical History:  Procedure Laterality Date  ABDOMINAL HYSTERECTOMY     BREAST BIOPSY     CARDIAC CATHETERIZATION     CATARACT EXTRACTION W/PHACO Left 02/12/2016   Procedure: CATARACT EXTRACTION PHACO AND INTRAOCULAR LENS PLACEMENT ([place]);  Surgeon: Eulogio Bear, MD;  Location: [facility];  Service: Ophthalmology;  Laterality: Left;  [place] 01:30 AP% 15.2 CDE 13.71 Fluid pack lot # [id number]   CATARACT EXTRACTION W/PHACO Right 03/18/2016   Procedure: CATARACT EXTRACTION PHACO AND INTRAOCULAR LENS PLACEMENT ([facility]);  Surgeon: Eulogio Bear, MD;  Location: [facility];  Service: Ophthalmology;  Laterality: Right;  Lot # [id number] H [place]: 01:05.5 AP%:14.3 CDE: 9.33   FRACTURE SURGERY Left    arm rod and screw   KNEE ARTHROSCOPY     OOPHORECTOMY     RCR     ROTATOR CUFF REPAIR Left 2006    Family History  Problem Relation Age of Onset   Emphysema Mother    Heart Problems Mother    Tuberculosis Father    Brain cancer Father    Skin cancer Father    Breast cancer Sister    Irritable bowel syndrome Sister    Lung cancer Brother    Dementia  Sister    Schizophrenia Sister     Allergies  Allergen Reactions   Augmentin [Amoxicillin-Pot Clavulanate]    Celecoxib    Eryc [Erythromycin]    Percocet [Oxycodone-Acetaminophen]     CBC Latest Ref Rng & Units 05/12/2021 11/29/2020 07/25/2020  WBC 4.0 - 10.5 K/uL 5.3 6.3 5.1  Hemoglobin 12.0 - 15.0 g/dL 13.5 13.1 13.7  Hematocrit 36.0 - 46.0 % 40.8 39.5 39.8  Platelets 150 - 400 K/uL 237 244 219      CMP     Component Value Date/Time   NA 133 (L) 05/12/2021 1018   NA 136 10/16/2012 1600   K 4.3 05/12/2021 1018   K 3.6 10/16/2012 1600   CL 95 (L) 05/12/2021 1018   CL 107 10/16/2012 1600   CO2 28 05/12/2021 1018   CO2 23 10/16/2012 1600   GLUCOSE 94 05/12/2021 1018   GLUCOSE 111 (H) 10/16/2012 1600   BUN 14 05/12/2021 1018   BUN 14 10/16/2012 1600   CREATININE 0.58 05/12/2021 1018   CREATININE 0.85 10/16/2012 1600   CALCIUM 8.9 05/12/2021 1018   CALCIUM 8.6 10/16/2012 1600   PROT 6.8 05/12/2021 1018   PROT 6.4 10/16/2012 1600   ALBUMIN 4.1 05/12/2021 1018   ALBUMIN 3.4 10/16/2012 1600   AST 22 05/12/2021 1018   AST 25 10/16/2012 1600   ALT 15 05/12/2021 1018   ALT 20 10/16/2012 1600   ALKPHOS 105 05/12/2021 1018   ALKPHOS 111 10/16/2012 1600   BILITOT 0.6 05/12/2021 1018   BILITOT 0.4 10/16/2012 1600   GFRNONAA >60 05/12/2021 1018   GFRNONAA >60 10/16/2012 1600   GFRAA >60 07/25/2019 1020   GFRAA >60 10/16/2012 1600     No results found.     Assessment & Plan:   1. Lymphedema The patient continues to have issues with heaviness and discomfort in her legs.  She does have some notable varicosities.  We will have the patient return in several weeks with a reflux study to determine if there is some underlying venous insufficiency or worsening her edema.  2. Primary hypertension Continue antihypertensive medications as already ordered, these medications have been reviewed and there are no changes at this time.    Current Outpatient Medications on File  Prior to Visit  Medication Sig Dispense Refill   aspirin  EC 81 MG tablet Take 81 mg by mouth daily.     butalbital-acetaminophen-caffeine (FIORICET) 50-325-40 MG tablet Take by mouth 2 (two) times daily as needed for headache.     cetirizine (ZYRTEC) 10 MG tablet Take 10 mg by mouth at bedtime.      Cholecalciferol (VITAMIN D PO) Take 5,000 Units by mouth daily.     Cyanocobalamin (VITAMIN B-12 PO) Take 2 tablets by mouth daily.     fluticasone (FLONASE) 50 MCG/ACT nasal spray Place 2 sprays into both nostrils daily.     ibandronate (BONIVA) 150 MG tablet 150 mg.     loratadine (CLARITIN) 10 MG tablet Take 10 mg by mouth daily.     Loratadine 10 MG CAPS Take 1 capsule by mouth daily.     meloxicam (MOBIC) 7.5 MG tablet Take 7.5 mg by mouth daily.     montelukast (SINGULAIR) 10 MG tablet Take 10 mg by mouth at bedtime.      Multiple Vitamins-Minerals (PRESERVISION AREDS PO) Take 1 capsule by mouth 2 (two) times daily.     omeprazole (PRILOSEC) 20 MG capsule Take 20 mg by mouth 2 (two) times daily before a meal.     SYMBICORT 160-4.5 MCG/ACT inhaler Inhale 2 puffs into the lungs 2 (two) times daily.     Turmeric 500 MG TABS Take 1 tablet by mouth 1 day or 1 dose.     VENTOLIN HFA 108 (90 Base) MCG/ACT inhaler Inhale 2 puffs into the lungs as needed.     furosemide (LASIX) 20 MG tablet Take by mouth.      RIVAROXABAN (XARELTO) VTE STARTER PACK (15 & 20 MG) Follow package directions: Take one 15mg  tablet by mouth twice a day. On day 22, switch to one 20mg  tablet once a day. Take with food. (Patient not taking: Reported on 07/16/2021) 51 each 0   traMADol (ULTRAM) 50 MG tablet Take 1 tablet (50 mg total) by mouth every 6 (six) hours as needed. (Patient not taking: Reported on 07/16/2021) 15 tablet 0   No current facility-administered medications on file prior to visit.    There are no Patient Instructions on file for this visit. No follow-ups on file.   Kris Hartmann, NP

## 2021-08-06 DIAGNOSIS — E7800 Pure hypercholesterolemia, unspecified: Secondary | ICD-10-CM | POA: Diagnosis not present

## 2021-08-06 DIAGNOSIS — Z79899 Other long term (current) drug therapy: Secondary | ICD-10-CM | POA: Diagnosis not present

## 2021-08-07 ENCOUNTER — Other Ambulatory Visit: Payer: Self-pay

## 2021-08-07 ENCOUNTER — Ambulatory Visit (INDEPENDENT_AMBULATORY_CARE_PROVIDER_SITE_OTHER): Payer: Medicare HMO | Admitting: Nurse Practitioner

## 2021-08-07 ENCOUNTER — Other Ambulatory Visit (INDEPENDENT_AMBULATORY_CARE_PROVIDER_SITE_OTHER): Payer: Self-pay | Admitting: Nurse Practitioner

## 2021-08-07 ENCOUNTER — Other Ambulatory Visit (INDEPENDENT_AMBULATORY_CARE_PROVIDER_SITE_OTHER): Payer: Medicare HMO

## 2021-08-07 ENCOUNTER — Encounter (INDEPENDENT_AMBULATORY_CARE_PROVIDER_SITE_OTHER): Payer: Self-pay | Admitting: Nurse Practitioner

## 2021-08-07 VITALS — BP 126/77 | HR 81 | Resp 16 | Ht <= 58 in | Wt 174.0 lb

## 2021-08-07 DIAGNOSIS — I83893 Varicose veins of bilateral lower extremities with other complications: Secondary | ICD-10-CM

## 2021-08-07 DIAGNOSIS — I890 Lymphedema, not elsewhere classified: Secondary | ICD-10-CM

## 2021-08-07 DIAGNOSIS — I83891 Varicose veins of right lower extremities with other complications: Secondary | ICD-10-CM | POA: Diagnosis not present

## 2021-08-07 DIAGNOSIS — I10 Essential (primary) hypertension: Secondary | ICD-10-CM

## 2021-08-09 ENCOUNTER — Encounter (INDEPENDENT_AMBULATORY_CARE_PROVIDER_SITE_OTHER): Payer: Self-pay | Admitting: Nurse Practitioner

## 2021-08-09 NOTE — Progress Notes (Signed)
Subjective:    Patient ID: Lindsey Thomas, female    DOB: 1941/04/28, 81 y.o.   MRN: [id number] Chief Complaint  Patient presents with   Follow-up    ultrasound    The patient returns today for noninvasive studies as well as evaluation of lower extremity edema.  Approximately 3 months ago the patient was admitted to wraps due to lower extremity swelling.  She has been wearing her medical grade compression stockings and has had no issues since then.  The patient has a previous history of ablation of her left great saphenous vein there is but no intervention to the right.  She notes that the swelling is worse on her right.  She also engaged in conservative therapy such as elevation which is helpful for her.  The patient is also active.  Despite these conservative therapies the patient still continues to have patient's swelling in the right lower extremity.  Today noninvasive studies show evidence of reflux in the right great saphenous vein.  No evidence of reflux in the left great saphenous vein with evidence of previous intervention.  No evidence of DVT or superficial thrombophlebitis bilaterally.  There is also a noted Baker's cyst behind her right knee measuring 2.89 cm x 1.54 cm   Review of Systems  Cardiovascular:  Positive for leg swelling.  Musculoskeletal:  Positive for arthralgias.  All other systems reviewed and are negative.     Objective:   Physical Exam Vitals reviewed.  HENT:     Head: Normocephalic.  Cardiovascular:     Rate and Rhythm: Normal rate.     Pulses: Normal pulses.  Pulmonary:     Effort: Pulmonary effort is normal.  Musculoskeletal:     Right lower leg: 2+ Edema present.     Left lower leg: 1+ Edema present.  Skin:    General: Skin is warm and dry.     Comments: Notable varicosities in the right lower extremity with some stasis dermatitis  Neurological:     Mental Status: She is alert and oriented to person, place, and time.  Psychiatric:         Mood and Affect: Mood normal.        Behavior: Behavior normal.        Thought Content: Thought content normal.        Judgment: Judgment normal.    BP 126/77 (BP Location: Left Arm)    Pulse 81    Resp 16    Ht 4\' 9"  (1.448 m)    Wt 174 lb (78.9 kg)    BMI 37.65 kg/m   Past Medical History:  Diagnosis Date   Anxiety    Arthritis    Asthma    DVT (deep venous thrombosis) (HCC)    Edema    feet   Emphysema of lung (HCC)    GERD (gastroesophageal reflux disease)    Headache    MIGRAINES   History of orthopnea    Hypercholesterolemia    Hypothyroidism    NODULES   Lymphedema    Migraine    Non-small cell lung cancer, right ([place]) 05/2019   Rad tx's   Shortness of breath dyspnea    WITH EXERTION   Sleep apnea    MILD, DOES NOT USE CPAP   Thyroid nodule     Social History   Socioeconomic History   Marital status: Married    Spouse name: Not on file   Number of children: Not on file   Years  of education: Not on file   Highest education level: Not on file  Occupational History   Not on file  Tobacco Use   Smoking status: Never   Smokeless tobacco: Never  Substance and Sexual Activity   Alcohol use: No   Drug use: Never   Sexual activity: Not on file  Other Topics Concern   Not on file  Social History Narrative   Not on file   Social Determinants of Health   Financial Resource Strain: Not on file  Food Insecurity: Not on file  Transportation Needs: Not on file  Physical Activity: Not on file  Stress: Not on file  Social Connections: Not on file  Intimate Partner Violence: Not on file    Past Surgical History:  Procedure Laterality Date   ABDOMINAL HYSTERECTOMY     BREAST BIOPSY     CARDIAC CATHETERIZATION     CATARACT EXTRACTION W/PHACO Left 02/12/2016   Procedure: CATARACT EXTRACTION PHACO AND INTRAOCULAR LENS PLACEMENT ([place]);  Surgeon: Eulogio Bear, MD;  Location: [facility];  Service: Ophthalmology;  Laterality: Left;  [place] 01:30 AP% 15.2 CDE  13.71 Fluid pack lot # [id number]   CATARACT EXTRACTION W/PHACO Right 03/18/2016   Procedure: CATARACT EXTRACTION PHACO AND INTRAOCULAR LENS PLACEMENT ([facility]);  Surgeon: Eulogio Bear, MD;  Location: [facility];  Service: Ophthalmology;  Laterality: Right;  Lot # [id number] H [place]: 01:05.5 AP%:14.3 CDE: 9.33   FRACTURE SURGERY Left    arm rod and screw   KNEE ARTHROSCOPY     OOPHORECTOMY     RCR     ROTATOR CUFF REPAIR Left 2006    Family History  Problem Relation Age of Onset   Emphysema Mother    Heart Problems Mother    Tuberculosis Father    Brain cancer Father    Skin cancer Father    Breast cancer Sister    Irritable bowel syndrome Sister    Lung cancer Brother    Dementia Sister    Schizophrenia Sister     Allergies  Allergen Reactions   Augmentin [Amoxicillin-Pot Clavulanate]    Celecoxib    Eryc [Erythromycin]    Percocet [Oxycodone-Acetaminophen]     CBC Latest Ref Rng & Units 05/12/2021 11/29/2020 07/25/2020  WBC 4.0 - 10.5 K/uL 5.3 6.3 5.1  Hemoglobin 12.0 - 15.0 g/dL 13.5 13.1 13.7  Hematocrit 36.0 - 46.0 % 40.8 39.5 39.8  Platelets 150 - 400 K/uL 237 244 219      CMP     Component Value Date/Time   NA 133 (L) 05/12/2021 1018   NA 136 10/16/2012 1600   K 4.3 05/12/2021 1018   K 3.6 10/16/2012 1600   CL 95 (L) 05/12/2021 1018   CL 107 10/16/2012 1600   CO2 28 05/12/2021 1018   CO2 23 10/16/2012 1600   GLUCOSE 94 05/12/2021 1018   GLUCOSE 111 (H) 10/16/2012 1600   BUN 14 05/12/2021 1018   BUN 14 10/16/2012 1600   CREATININE 0.58 05/12/2021 1018   CREATININE 0.85 10/16/2012 1600   CALCIUM 8.9 05/12/2021 1018   CALCIUM 8.6 10/16/2012 1600   PROT 6.8 05/12/2021 1018   PROT 6.4 10/16/2012 1600   ALBUMIN 4.1 05/12/2021 1018   ALBUMIN 3.4 10/16/2012 1600   AST 22 05/12/2021 1018   AST 25 10/16/2012 1600   ALT 15 05/12/2021 1018   ALT 20 10/16/2012 1600   ALKPHOS 105 05/12/2021 1018   ALKPHOS 111 10/16/2012 1600   BILITOT 0.6 05/12/2021 1018  BILITOT 0.4 10/16/2012 1600   GFRNONAA >60 05/12/2021 1018   GFRNONAA >60 10/16/2012 1600   GFRAA >60 07/25/2019 1020   GFRAA >60 10/16/2012 1600     No results found.     Assessment & Plan:   1. Lymphedema Patient is advised to continue with conservative therapy including use of medical grade compression, elevation and activity.  We will have the patient follow-up in 6 months to evaluate progress with edema or sooner if issues arise.  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  Given the previous left lower extremity has been treated with endovenous ablation, treatment of the right lower extremity to help with swelling is reasonable option.  We discussed the risk benefits and alternatives.  The patient would like to consider moving forward.  She will contact her office if she decides to do so.  3. Primary hypertension Continue antihypertensive medications as already ordered, these medications have been reviewed and there are no changes at this time.    Current Outpatient Medications on File Prior to Visit  Medication Sig Dispense Refill   aspirin EC 81 MG tablet Take 81 mg by mouth daily.     cetirizine (ZYRTEC) 10 MG tablet Take 10 mg by mouth at bedtime.      Cholecalciferol (VITAMIN D PO) Take 5,000 Units by mouth daily.     Cyanocobalamin (VITAMIN B-12 PO) Take 2 tablets by mouth daily.     fluticasone (FLONASE) 50 MCG/ACT nasal spray Place 2 sprays into both nostrils daily.     ibandronate (BONIVA) 150 MG tablet 150 mg.     meloxicam (MOBIC) 7.5 MG tablet Take 7.5 mg by mouth daily.     montelukast (SINGULAIR) 10 MG tablet Take 10 mg by mouth at bedtime.      Multiple Vitamins-Minerals (PRESERVISION AREDS PO) Take 1 capsule by mouth 2 (two) times daily.     omeprazole (PRILOSEC) 20 MG capsule Take 20 mg by mouth 2 (two) times daily before a meal.     SYMBICORT 160-4.5 MCG/ACT  inhaler Inhale 2 puffs into the lungs 2 (two) times daily.     VENTOLIN HFA 108 (90 Base) MCG/ACT inhaler Inhale 2 puffs into the lungs as needed.     butalbital-acetaminophen-caffeine (FIORICET) 50-325-40 MG tablet Take by mouth 2 (two) times daily as needed for headache. (Patient not taking: Reported on 08/07/2021)     furosemide (LASIX) 20 MG tablet Take by mouth.      loratadine (CLARITIN) 10 MG tablet Take 10 mg by mouth daily. (Patient not taking: Reported on 08/07/2021)     Loratadine 10 MG CAPS Take 1 capsule by mouth daily. (Patient not taking: Reported on 08/07/2021)     RIVAROXABAN (XARELTO) VTE STARTER PACK (15 & 20 MG) Follow package directions: Take one 15mg  tablet by mouth twice a day. On day 22, switch to one 20mg  tablet once a day. Take with food. (Patient not taking: Reported on 07/16/2021) 51 each 0   traMADol (ULTRAM) 50 MG tablet Take 1 tablet (50 mg total) by mouth every 6 (six) hours as needed. (Patient not taking: Reported on 07/16/2021) 15 tablet 0   Turmeric 500 MG TABS Take 1 tablet by mouth 1 day or 1 dose. (Patient not taking: Reported on 08/07/2021)     No current facility-administered medications on file prior to visit.    There are no Patient Instructions on file for this visit.  No follow-ups on file.   Kris Hartmann, NP

## 2021-08-13 DIAGNOSIS — H9190 Unspecified hearing loss, unspecified ear: Secondary | ICD-10-CM | POA: Diagnosis not present

## 2021-08-13 DIAGNOSIS — M79672 Pain in left foot: Secondary | ICD-10-CM | POA: Diagnosis not present

## 2021-08-13 DIAGNOSIS — I890 Lymphedema, not elsewhere classified: Secondary | ICD-10-CM | POA: Diagnosis not present

## 2021-08-13 DIAGNOSIS — M25561 Pain in right knee: Secondary | ICD-10-CM | POA: Diagnosis not present

## 2021-08-13 DIAGNOSIS — E7800 Pure hypercholesterolemia, unspecified: Secondary | ICD-10-CM | POA: Diagnosis not present

## 2021-08-13 DIAGNOSIS — Z0000 Encounter for general adult medical examination without abnormal findings: Secondary | ICD-10-CM | POA: Diagnosis not present

## 2021-08-13 DIAGNOSIS — M79671 Pain in right foot: Secondary | ICD-10-CM | POA: Diagnosis not present

## 2021-08-13 DIAGNOSIS — E669 Obesity, unspecified: Secondary | ICD-10-CM | POA: Diagnosis not present

## 2021-08-13 DIAGNOSIS — M810 Age-related osteoporosis without current pathological fracture: Secondary | ICD-10-CM | POA: Diagnosis not present

## 2021-08-18 DIAGNOSIS — M25561 Pain in right knee: Secondary | ICD-10-CM | POA: Diagnosis not present

## 2021-08-18 DIAGNOSIS — M1711 Unilateral primary osteoarthritis, right knee: Secondary | ICD-10-CM | POA: Diagnosis not present

## 2021-08-18 DIAGNOSIS — M7121 Synovial cyst of popliteal space [Baker], right knee: Secondary | ICD-10-CM | POA: Diagnosis not present

## 2021-08-18 DIAGNOSIS — G8929 Other chronic pain: Secondary | ICD-10-CM | POA: Diagnosis not present

## 2021-08-20 DIAGNOSIS — M79675 Pain in left toe(s): Secondary | ICD-10-CM | POA: Diagnosis not present

## 2021-08-20 DIAGNOSIS — L600 Ingrowing nail: Secondary | ICD-10-CM | POA: Diagnosis not present

## 2021-08-20 DIAGNOSIS — M2042 Other hammer toe(s) (acquired), left foot: Secondary | ICD-10-CM | POA: Diagnosis not present

## 2021-08-20 DIAGNOSIS — M2012 Hallux valgus (acquired), left foot: Secondary | ICD-10-CM | POA: Diagnosis not present

## 2021-08-20 DIAGNOSIS — B351 Tinea unguium: Secondary | ICD-10-CM | POA: Diagnosis not present

## 2021-08-20 DIAGNOSIS — M79674 Pain in right toe(s): Secondary | ICD-10-CM | POA: Diagnosis not present

## 2021-09-01 DIAGNOSIS — R0600 Dyspnea, unspecified: Secondary | ICD-10-CM | POA: Diagnosis not present

## 2021-09-01 DIAGNOSIS — E7800 Pure hypercholesterolemia, unspecified: Secondary | ICD-10-CM | POA: Diagnosis not present

## 2021-09-01 DIAGNOSIS — R0689 Other abnormalities of breathing: Secondary | ICD-10-CM | POA: Diagnosis not present

## 2021-09-01 DIAGNOSIS — I890 Lymphedema, not elsewhere classified: Secondary | ICD-10-CM | POA: Diagnosis not present

## 2021-09-16 DIAGNOSIS — R0689 Other abnormalities of breathing: Secondary | ICD-10-CM | POA: Diagnosis not present

## 2021-09-16 DIAGNOSIS — Z01818 Encounter for other preprocedural examination: Secondary | ICD-10-CM | POA: Diagnosis not present

## 2021-09-16 DIAGNOSIS — R0600 Dyspnea, unspecified: Secondary | ICD-10-CM | POA: Diagnosis not present

## 2021-09-18 DIAGNOSIS — J449 Chronic obstructive pulmonary disease, unspecified: Secondary | ICD-10-CM | POA: Diagnosis not present

## 2021-09-18 DIAGNOSIS — M1711 Unilateral primary osteoarthritis, right knee: Secondary | ICD-10-CM | POA: Diagnosis not present

## 2021-09-18 DIAGNOSIS — M25561 Pain in right knee: Secondary | ICD-10-CM | POA: Diagnosis not present

## 2021-09-18 DIAGNOSIS — G8929 Other chronic pain: Secondary | ICD-10-CM | POA: Diagnosis not present

## 2021-09-18 DIAGNOSIS — M7121 Synovial cyst of popliteal space [Baker], right knee: Secondary | ICD-10-CM | POA: Diagnosis not present

## 2021-10-28 DIAGNOSIS — M25562 Pain in left knee: Secondary | ICD-10-CM | POA: Diagnosis not present

## 2021-10-28 DIAGNOSIS — M1712 Unilateral primary osteoarthritis, left knee: Secondary | ICD-10-CM | POA: Diagnosis not present

## 2021-11-09 DIAGNOSIS — H903 Sensorineural hearing loss, bilateral: Secondary | ICD-10-CM | POA: Diagnosis not present

## 2021-11-11 ENCOUNTER — Ambulatory Visit: Payer: Medicare HMO | Admitting: Radiation Oncology

## 2021-11-16 DIAGNOSIS — M810 Age-related osteoporosis without current pathological fracture: Secondary | ICD-10-CM | POA: Diagnosis not present

## 2021-11-16 DIAGNOSIS — I890 Lymphedema, not elsewhere classified: Secondary | ICD-10-CM | POA: Diagnosis not present

## 2021-11-16 DIAGNOSIS — E7800 Pure hypercholesterolemia, unspecified: Secondary | ICD-10-CM | POA: Diagnosis not present

## 2021-11-16 DIAGNOSIS — Z79899 Other long term (current) drug therapy: Secondary | ICD-10-CM | POA: Diagnosis not present

## 2021-11-16 DIAGNOSIS — Z1211 Encounter for screening for malignant neoplasm of colon: Secondary | ICD-10-CM | POA: Diagnosis not present

## 2021-11-16 DIAGNOSIS — Z1231 Encounter for screening mammogram for malignant neoplasm of breast: Secondary | ICD-10-CM | POA: Diagnosis not present

## 2021-11-17 ENCOUNTER — Other Ambulatory Visit: Payer: Self-pay | Admitting: Internal Medicine

## 2021-11-17 DIAGNOSIS — Z1231 Encounter for screening mammogram for malignant neoplasm of breast: Secondary | ICD-10-CM

## 2021-11-18 DIAGNOSIS — H353132 Nonexudative age-related macular degeneration, bilateral, intermediate dry stage: Secondary | ICD-10-CM | POA: Diagnosis not present

## 2021-11-26 DIAGNOSIS — Z79899 Other long term (current) drug therapy: Secondary | ICD-10-CM | POA: Diagnosis not present

## 2021-11-26 DIAGNOSIS — I890 Lymphedema, not elsewhere classified: Secondary | ICD-10-CM | POA: Diagnosis not present

## 2021-12-02 DIAGNOSIS — J439 Emphysema, unspecified: Secondary | ICD-10-CM | POA: Diagnosis not present

## 2021-12-02 DIAGNOSIS — I890 Lymphedema, not elsewhere classified: Secondary | ICD-10-CM | POA: Diagnosis not present

## 2021-12-02 DIAGNOSIS — E7800 Pure hypercholesterolemia, unspecified: Secondary | ICD-10-CM | POA: Diagnosis not present

## 2021-12-02 DIAGNOSIS — I288 Other diseases of pulmonary vessels: Secondary | ICD-10-CM | POA: Diagnosis not present

## 2021-12-02 DIAGNOSIS — R0689 Other abnormalities of breathing: Secondary | ICD-10-CM | POA: Diagnosis not present

## 2021-12-02 DIAGNOSIS — R0600 Dyspnea, unspecified: Secondary | ICD-10-CM | POA: Diagnosis not present

## 2021-12-04 ENCOUNTER — Ambulatory Visit
Admission: RE | Admit: 2021-12-04 | Discharge: 2021-12-04 | Disposition: A | Discharge status: Home / Self Care | Payer: Medicare HMO | Source: Ambulatory Visit | Attending: Oncology | Admitting: Oncology

## 2021-12-04 DIAGNOSIS — Z08 Encounter for follow-up examination after completed treatment for malignant neoplasm: Secondary | ICD-10-CM | POA: Diagnosis not present

## 2021-12-04 DIAGNOSIS — Z85118 Personal history of other malignant neoplasm of bronchus and lung: Secondary | ICD-10-CM | POA: Insufficient documentation

## 2021-12-04 DIAGNOSIS — R918 Other nonspecific abnormal finding of lung field: Secondary | ICD-10-CM | POA: Diagnosis not present

## 2021-12-09 ENCOUNTER — Encounter: Payer: Self-pay | Admitting: Oncology

## 2021-12-09 ENCOUNTER — Ambulatory Visit: Payer: Medicare HMO | Admitting: Radiation Oncology

## 2021-12-09 ENCOUNTER — Inpatient Hospital Stay: Payer: Medicare HMO | Attending: Oncology | Admitting: Oncology

## 2021-12-09 VITALS — BP 118/61 | HR 84 | Temp 98.9°F | Resp 16 | Ht <= 58 in | Wt 173.8 lb

## 2021-12-09 DIAGNOSIS — C3411 Malignant neoplasm of upper lobe, right bronchus or lung: Secondary | ICD-10-CM | POA: Diagnosis not present

## 2021-12-09 DIAGNOSIS — Z85118 Personal history of other malignant neoplasm of bronchus and lung: Secondary | ICD-10-CM

## 2021-12-09 DIAGNOSIS — Z923 Personal history of irradiation: Secondary | ICD-10-CM | POA: Diagnosis not present

## 2021-12-09 DIAGNOSIS — Z08 Encounter for follow-up examination after completed treatment for malignant neoplasm: Secondary | ICD-10-CM

## 2021-12-09 NOTE — Progress Notes (Signed)
Hematology/Oncology Consult note [facility]  Telephone:([phone] [phone]  Patient Care Team: Idelle Crouch, MD as PCP - General (Internal Medicine) Noreene Filbert, MD as Referring Physician (Radiation Oncology) Sindy Guadeloupe, MD as Consulting Physician (Oncology) Erby Pian, MD as Referring Physician (Specialist) Telford Nab, RN as Registered Nurse   Name of the patient: Lindsey Thomas  [id number]  1940/12/24   Date of visit: 12/09/21  Diagnosis- history of lung cancer s/p SBRT  Chief complaint/ Reason for visit-routine follow-up of lung cancer and discuss CT scan results and further management  Heme/Onc history: patient is a 81 year old female with a past medical history significant for COPD hypertension hyperlipidemia and hypothyroidism among other medical problems.  She presented with symptoms of shortness of breath which prompted a CT scan. CT chest on 04/17/2019 showed a spiculated peripheral right upper lobe lung lesion measuring 18 x 13 mm concerning for primary lung cancer.  No evidence of mediastinal or hilar adenopathy.  This was followed by a PET CT scan which again showed uptake with an SUV of 3.1 in the area of the right upper lobe.  Smaller subcentimeter pulmonary nodules with no significant metabolic uptake.  No metabolic adenopathy.  Patient was seen by Dr. Raul Del for possible bronchoscopy.  Patient tells me that Dr. Raul Del also got in touch with radiology who thought that CT-guided lung biopsy did carry a risk of pneumothorax and lung collapse.  biopsy was not possible safely and she underwent SBRT to RUL without biopsy    Interval history-patient is doing well for her age.  Appetite and weight have remained stable.  Denies any worsening cough or shortness of breath.  Denies any recent hospitalizations  ECOG PS- 1 Pain scale- 0   Review of systems- Review of Systems  Constitutional:  Negative for chills,  fever, malaise/fatigue and weight loss.  HENT:  Negative for congestion, ear discharge and nosebleeds.   Eyes:  Negative for blurred vision.  Respiratory:  Negative for cough, hemoptysis, sputum production, shortness of breath and wheezing.   Cardiovascular:  Negative for chest pain, palpitations, orthopnea and claudication.  Gastrointestinal:  Negative for abdominal pain, blood in stool, constipation, diarrhea, heartburn, melena, nausea and vomiting.  Genitourinary:  Negative for dysuria, flank pain, frequency, hematuria and urgency.  Musculoskeletal:  Negative for back pain, joint pain and myalgias.  Skin:  Negative for rash.  Neurological:  Negative for dizziness, tingling, focal weakness, seizures, weakness and headaches.  Endo/Heme/Allergies:  Does not bruise/bleed easily.  Psychiatric/Behavioral:  Negative for depression and suicidal ideas. The patient does not have insomnia.       Allergies  Allergen Reactions   Augmentin [Amoxicillin-Pot Clavulanate]    Celecoxib    Eryc [Erythromycin]    Levofloxacin Nausea Only   Percocet [Oxycodone-Acetaminophen]      Past Medical History:  Diagnosis Date   Anxiety    Arthritis    Asthma    DVT (deep venous thrombosis) (HCC)    Edema    feet   Emphysema of lung (HCC)    GERD (gastroesophageal reflux disease)    Headache    MIGRAINES   History of orthopnea    Hypercholesterolemia    Hypothyroidism    NODULES   Lymphedema    Migraine    Non-small cell lung cancer, right ([place]) 05/2019   Rad tx's   Shortness of breath dyspnea    WITH EXERTION   Sleep apnea    MILD, DOES NOT  USE CPAP   Thyroid nodule      Past Surgical History:  Procedure Laterality Date   ABDOMINAL HYSTERECTOMY     BREAST BIOPSY     CARDIAC CATHETERIZATION     CATARACT EXTRACTION W/PHACO Left 02/12/2016   Procedure: CATARACT EXTRACTION PHACO AND INTRAOCULAR LENS PLACEMENT (IOC);  Surgeon: Eulogio Bear, MD;  Location: [facility] ORS;  Service:  Ophthalmology;  Laterality: Left;  [place] 01:30 AP% 15.2 CDE 13.71 Fluid pack lot # [id number]   CATARACT EXTRACTION W/PHACO Right 03/18/2016   Procedure: CATARACT EXTRACTION PHACO AND INTRAOCULAR LENS PLACEMENT (IOC);  Surgeon: Eulogio Bear, MD;  Location: [facility] ORS;  Service: Ophthalmology;  Laterality: Right;  Lot # [id number] H [place]: 01:05.5 AP%:14.3 CDE: 9.33   FRACTURE SURGERY Left    arm rod and screw   KNEE ARTHROSCOPY     OOPHORECTOMY     RCR     ROTATOR CUFF REPAIR Left 2006    Social History   Socioeconomic History   Marital status: Married    Spouse name: Not on file   Number of children: Not on file   Years of education: Not on file   Highest education level: Not on file  Occupational History   Not on file  Tobacco Use   Smoking status: Never   Smokeless tobacco: Never  Substance and Sexual Activity   Alcohol use: No   Drug use: Never   Sexual activity: Not on file  Other Topics Concern   Not on file  Social History Narrative   Not on file   Social Determinants of Health   Financial Resource Strain: Not on file  Food Insecurity: Not on file  Transportation Needs: Not on file  Physical Activity: Not on file  Stress: Not on file  Social Connections: Not on file  Intimate Partner Violence: Not on file    Family History  Problem Relation Age of Onset   Emphysema Mother    Heart Problems Mother    Tuberculosis Father    Brain cancer Father    Skin cancer Father    Breast cancer Sister    Irritable bowel syndrome Sister    Lung cancer Brother    Dementia Sister    Schizophrenia Sister      Current Outpatient Medications:    meloxicam (MOBIC) 7.5 MG tablet, Take 1 tablet by mouth daily., Disp: , Rfl:    aspirin EC 81 MG tablet, Take 81 mg by mouth daily., Disp: , Rfl:    butalbital-acetaminophen-caffeine (FIORICET) 50-325-40 MG tablet, Take by mouth 2 (two) times daily as needed for headache. (Patient not taking: Reported on 08/07/2021), Disp: , Rfl:     cetirizine (ZYRTEC) 10 MG tablet, Take 10 mg by mouth at bedtime. , Disp: , Rfl:    Cholecalciferol (VITAMIN D PO), Take 5,000 Units by mouth daily., Disp: , Rfl:    Cyanocobalamin (VITAMIN B-12 PO), Take 2 tablets by mouth daily., Disp: , Rfl:    fluticasone (FLONASE) 50 MCG/ACT nasal spray, Place 2 sprays into both nostrils daily., Disp: , Rfl:    furosemide (LASIX) 20 MG tablet, Take by mouth. , Disp: , Rfl:    ibandronate (BONIVA) 150 MG tablet, 150 mg., Disp: , Rfl:    loratadine (CLARITIN) 10 MG tablet, Take 10 mg by mouth daily. (Patient not taking: Reported on 08/07/2021), Disp: , Rfl:    Loratadine 10 MG CAPS, Take 1 capsule by mouth daily. (Patient not taking: Reported on 08/07/2021), Disp: , Rfl:  meloxicam (MOBIC) 7.5 MG tablet, Take 7.5 mg by mouth daily., Disp: , Rfl:    montelukast (SINGULAIR) 10 MG tablet, Take 10 mg by mouth at bedtime. , Disp: , Rfl:    Multiple Vitamins-Minerals (PRESERVISION AREDS PO), Take 1 capsule by mouth 2 (two) times daily., Disp: , Rfl:    omeprazole (PRILOSEC) 20 MG capsule, Take 20 mg by mouth 2 (two) times daily before a meal., Disp: , Rfl:    RIVAROXABAN (XARELTO) VTE STARTER PACK (15 & 20 MG), Follow package directions: Take one 15mg  tablet by mouth twice a day. On day 22, switch to one 20mg  tablet once a day. Take with food. (Patient not taking: Reported on 07/16/2021), Disp: 51 each, Rfl: 0   SYMBICORT 160-4.5 MCG/ACT inhaler, Inhale 2 puffs into the lungs 2 (two) times daily., Disp: , Rfl:    traMADol (ULTRAM) 50 MG tablet, Take 1 tablet (50 mg total) by mouth every 6 (six) hours as needed. (Patient not taking: Reported on 07/16/2021), Disp: 15 tablet, Rfl: 0   Turmeric 500 MG TABS, Take 1 tablet by mouth 1 day or 1 dose. (Patient not taking: Reported on 08/07/2021), Disp: , Rfl:    VENTOLIN HFA 108 (90 Base) MCG/ACT inhaler, Inhale 2 puffs into the lungs as needed., Disp: , Rfl:   Physical exam:  Vitals:   12/09/21 1447  BP: 118/61  Pulse: 84   Resp: 16  Temp: 98.9 F (37.2 C)  TempSrc: Tympanic  SpO2: 96%  Weight: 173 lb 12.8 oz (78.8 kg)  Height: 4\' 9"  (1.448 m)   Physical Exam Cardiovascular:     Rate and Rhythm: Normal rate and regular rhythm.     Heart sounds: Normal heart sounds.  Pulmonary:     Effort: Pulmonary effort is normal.     Breath sounds: Normal breath sounds.  Skin:    General: Skin is warm and dry.  Neurological:     Mental Status: She is alert and oriented to person, place, and time.         Latest Ref Rng & Units 05/12/2021   10:18 AM  CMP  Glucose 70 - 99 mg/dL 94   BUN 8 - 23 mg/dL 14   Creatinine 0.44 - 1.00 mg/dL 0.58   Sodium 135 - 145 mmol/L 133   Potassium 3.5 - 5.1 mmol/L 4.3   Chloride 98 - 111 mmol/L 95   CO2 22 - 32 mmol/L 28   Calcium 8.9 - 10.3 mg/dL 8.9   Total Protein 6.5 - 8.1 g/dL 6.8   Total Bilirubin 0.3 - 1.2 mg/dL 0.6   Alkaline Phos 38 - 126 U/L 105   AST 15 - 41 U/L 22   ALT 0 - 44 U/L 15       Latest Ref Rng & Units 05/12/2021   10:18 AM  CBC  WBC 4.0 - 10.5 K/uL 5.3   Hemoglobin 12.0 - 15.0 g/dL 13.5   Hematocrit 36.0 - 46.0 % 40.8   Platelets 150 - 400 K/uL 237     No images are attached to the encounter.  CT Chest Wo Contrast  Result Date: 12/06/2021 CLINICAL DATA:  History of right upper lobe non-small cell lung cancer. Completed radiation therapy in 2021. Surveillance examination. EXAM: CT CHEST WITHOUT CONTRAST TECHNIQUE: Multidetector CT imaging of the chest was performed following the standard protocol without IV contrast. RADIATION DOSE REDUCTION: This exam was performed according to the departmental dose-optimization program which includes automated exposure control, adjustment of the  mA and/or kV according to patient size and/or use of iterative reconstruction technique. COMPARISON:  Numerous previous imaging studies. The most recent chest CT is 05/12/2021. FINDINGS: Cardiovascular: The heart is normal in size. No pericardial effusion. Stable  lipomatous hypertrophy of the interatrial septum. The aorta is normal in caliber. Minimal scattered atherosclerotic calcifications. Remarkably little coronary artery calcifications. Mediastinum/Nodes: No mediastinal or hilar mass or lymphadenopathy. Small scattered lymph nodes are stable. The esophagus is unremarkable. Stable 2 cm low-attenuation left thyroid nodule measuring fluid attenuation in unchanged since 2020 and negative on prior PET-CT. Lungs/Pleura: Stable post treatment changes in the right upper lobe with thickening and interstitial changes around the minor fissure. No findings suspicious for recurrent tumor. Stable small bilateral pulmonary nodules since 2021 and considered benign. There was a question of a new right middle lobe nodule on the prior study but this has been present on more remote studies. There are 2 new pulmonary abnormalities. One is in the left upper lobe anteriorly and the other is in the right lower lobe posteriorly. These are both branching lesions with mild surrounding inflammation and most consistent with areas of mucoid impaction. Attention on follow-up studies is suggested. No new worrisome pulmonary lesions. No pulmonary infiltrates or pleural effusions. Upper Abdomen: No significant upper abdominal findings. No hepatic or adrenal gland lesions. 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 Stable aortic and branch vessel calcifications. Musculoskeletal: No breast masses, supraclavicular or axillary adenopathy. The bony structures are unremarkable. No worrisome bone lesions. IMPRESSION: 1. Stable post treatment changes in the right upper lobe. No findings suspicious for recurrent tumor. 2. Stable small (sub 5 mm) bilateral pulmonary nodules since 2021 and considered benign. 3. Two new pulmonary abnormalities as discussed above, likely areas of mucoid impaction. Recommend follow-up noncontrast chest CT in 3-6 months to re-evaluate. 4. No mediastinal or hilar mass or  adenopathy. 5. Stable 2 cm cystic left thyroid nodule, unchanged since 2020. 6. 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 7. Aortic atherosclerosis. Aortic Atherosclerosis (ICD10-I70.0). Electronically Signed   By: Marijo Sanes M.D.   On: 12/06/2021 09:34     Assessment and plan- Patient is a 81 y.o. female with history of presumed stage IRight upper lobe lung cancer s/p SBRT here to discuss further management  I have reviewed CT chest images independently and discussed findings with the patient which shows overall stable changes in the right upper lobe postradiation.  Bilateral subcentimeter lung nodules overall stable since 2021.  There are 2 areas of possible mucoid impaction seen in the left upper lobe as well as the right lower lobe which does not require any active treatment at this time.  I will plan to repeat CT scan in 6 months and see her thereafter   Visit Diagnosis 1. Encounter for follow-up surveillance of lung cancer      Dr. Randa Evens, MD, MPH [facility] [phone] 12/09/2021 3:54 PM

## 2021-12-14 DIAGNOSIS — I890 Lymphedema, not elsewhere classified: Secondary | ICD-10-CM | POA: Diagnosis not present

## 2021-12-14 DIAGNOSIS — R0689 Other abnormalities of breathing: Secondary | ICD-10-CM | POA: Diagnosis not present

## 2021-12-14 DIAGNOSIS — R0600 Dyspnea, unspecified: Secondary | ICD-10-CM | POA: Diagnosis not present

## 2021-12-15 DIAGNOSIS — Z9989 Dependence on other enabling machines and devices: Secondary | ICD-10-CM | POA: Diagnosis not present

## 2021-12-15 DIAGNOSIS — G4733 Obstructive sleep apnea (adult) (pediatric): Secondary | ICD-10-CM | POA: Diagnosis not present

## 2021-12-15 DIAGNOSIS — R918 Other nonspecific abnormal finding of lung field: Secondary | ICD-10-CM | POA: Diagnosis not present

## 2021-12-15 DIAGNOSIS — J439 Emphysema, unspecified: Secondary | ICD-10-CM | POA: Diagnosis not present

## 2021-12-15 DIAGNOSIS — C3411 Malignant neoplasm of upper lobe, right bronchus or lung: Secondary | ICD-10-CM | POA: Diagnosis not present

## 2021-12-15 IMAGING — CR DG KNEE COMPLETE 4+V*L*
5 series · 5 of 5 positions shown · non-contrast
Comparison: 03/16/2008 .

CLINICAL DATA: Bruising and swelling

EXAM:
LEFT KNEE - COMPLETE 4+ VIEW

[knee ap (1 of 2)]
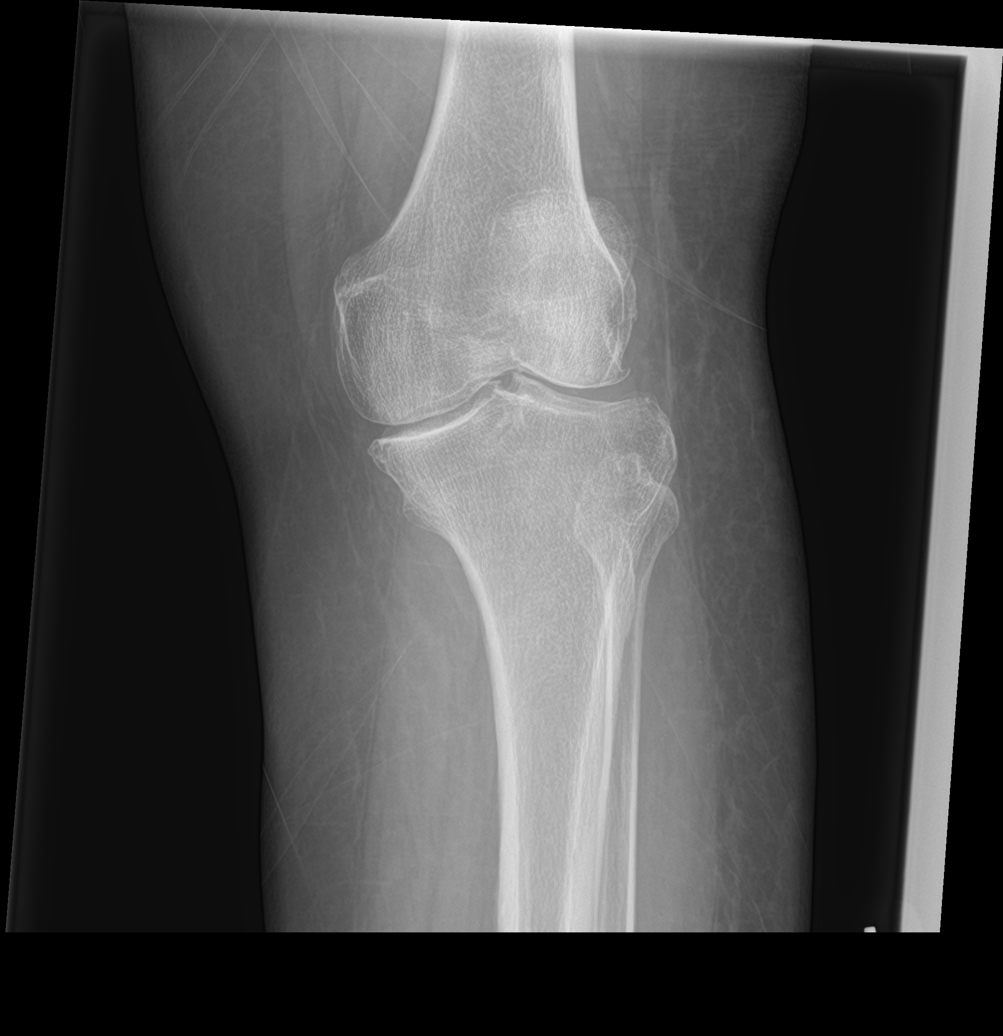

[knee obl (1 of 2)]
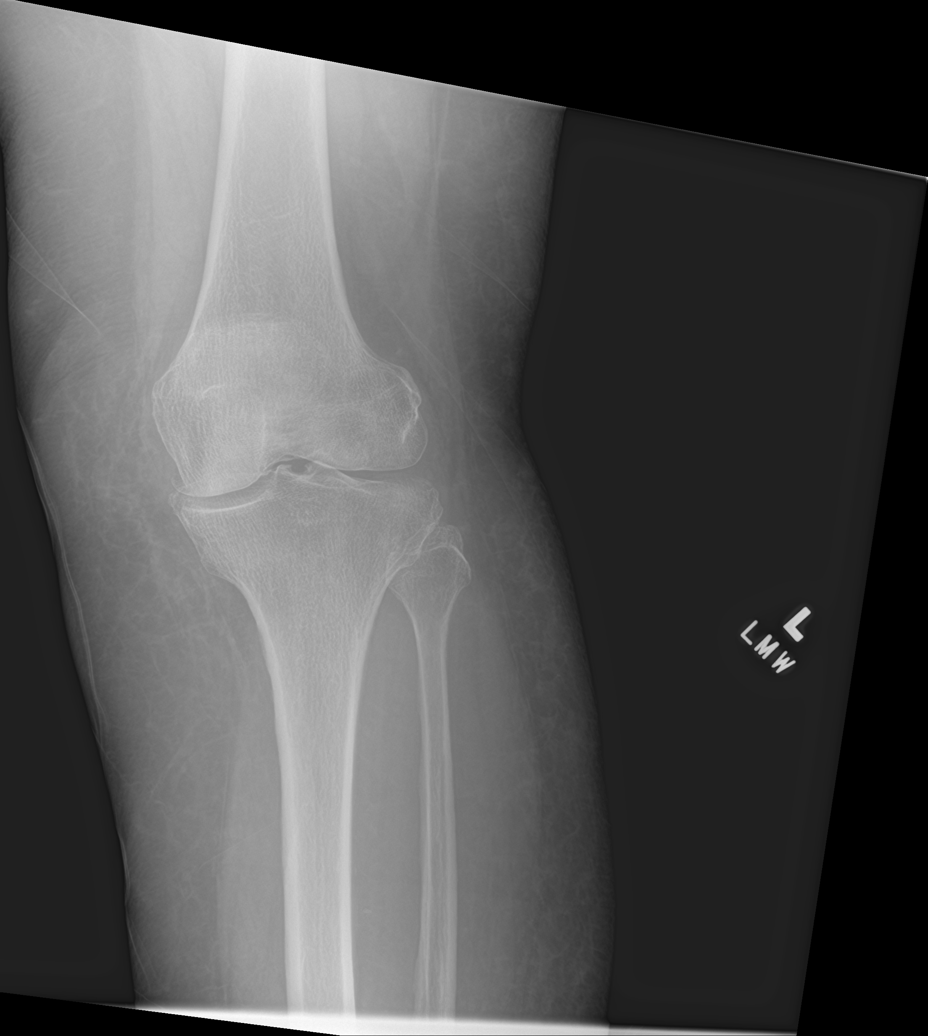

[knee obl (2 of 2)]
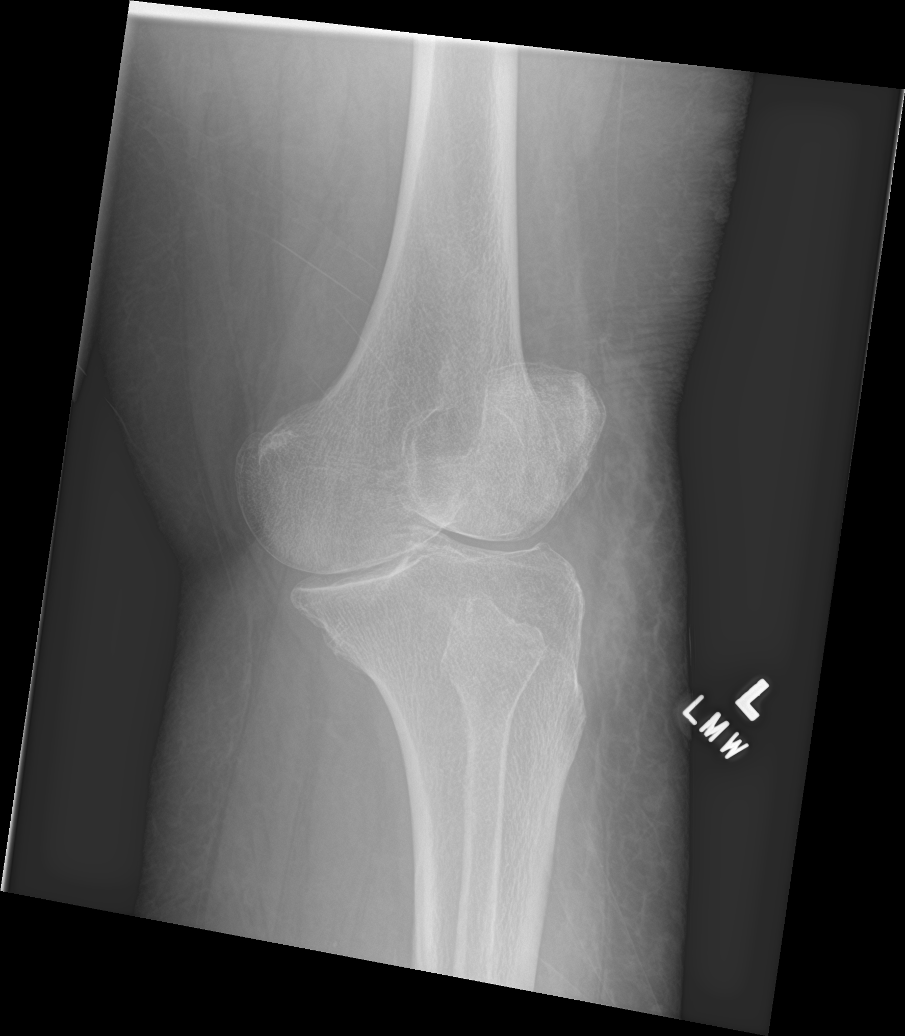

[knee lat]
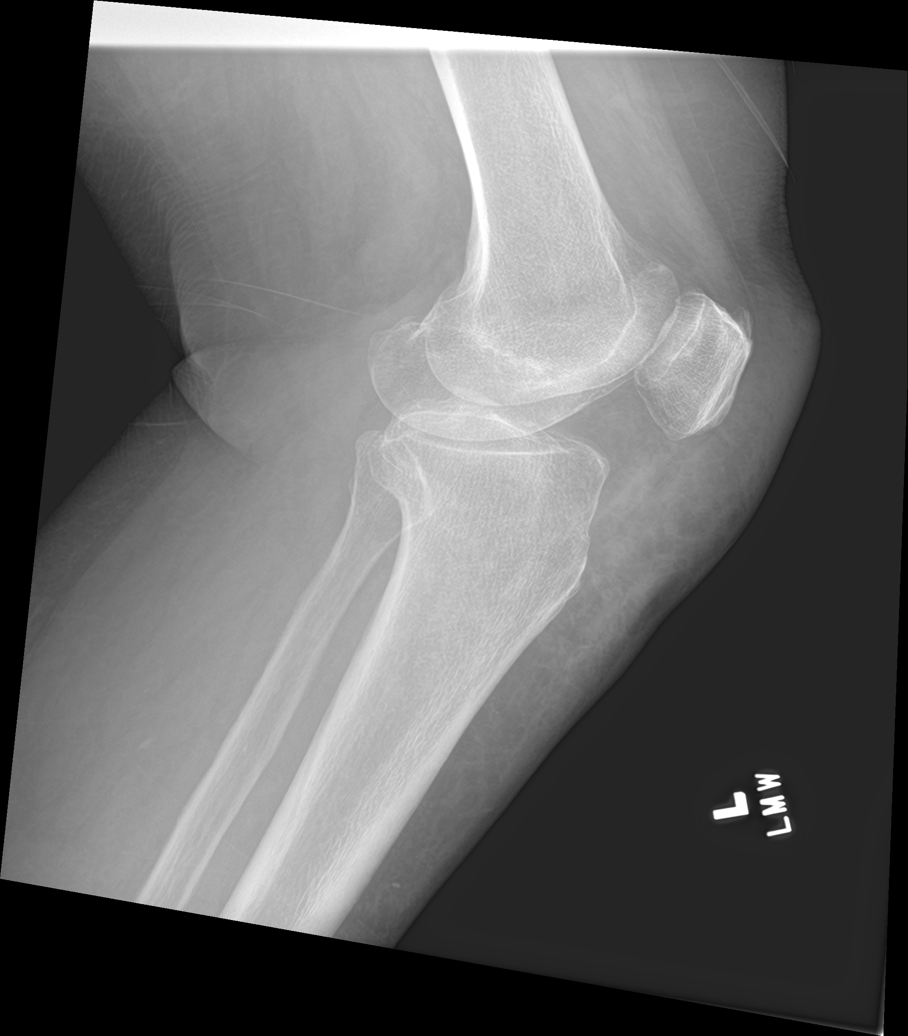

[knee ap (2 of 2)]
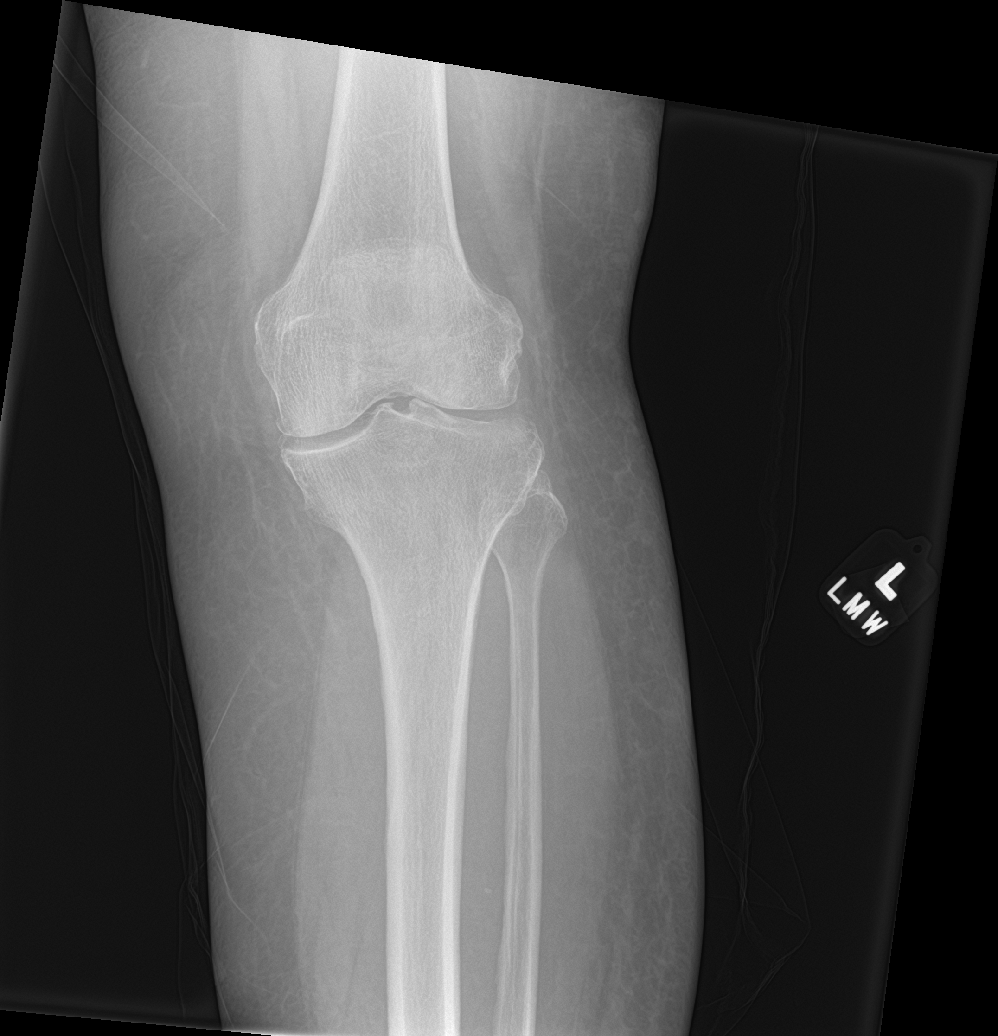

[5 of 5 positions shown; findings below may reference images not displayed]

FINDINGS: no definitive fracture or malalignment. Mild tricompartment
arthritis of the knee. No sizable effusion. Edema within the
prepatellar and infrapatellar soft tissues
IMPRESSION: No acute osseous abnormality

## 2021-12-15 IMAGING — CT CT HEAD W/O CM
4 of 5 series · 14 of 47 positions shown, 16 images · non-contrast
Comparison: CT brain and cervical spine 06/16/2020, 05/23/2020, CT
chest 08/12/2011

CLINICAL DATA: Fall with forehead laceration

EXAM:
CT HEAD WITHOUT CONTRAST
CT CERVICAL SPINE WITHOUT CONTRAST
TECHNIQUE: Multidetector CT imaging of the head and cervical spine was
performed following the standard protocol without intravenous
contrast. Multiplanar CT image reconstructions of the cervical spine
were also generated.

[Series 2: head bone · axial · 0.39mm/px · z∈[-79,-61]mm · 2 of 79 slices shown]
[im 9/79  bone]
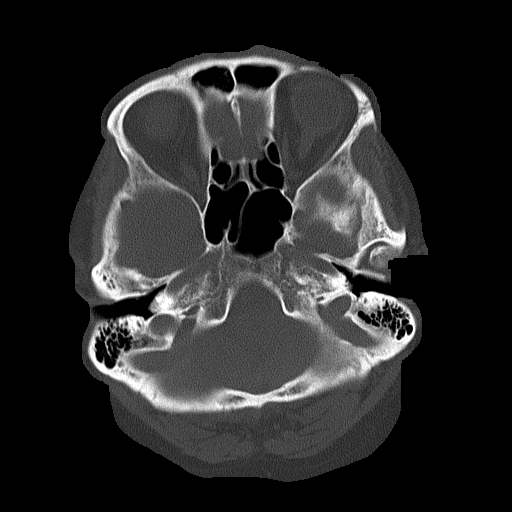
[im 18/79  bone]
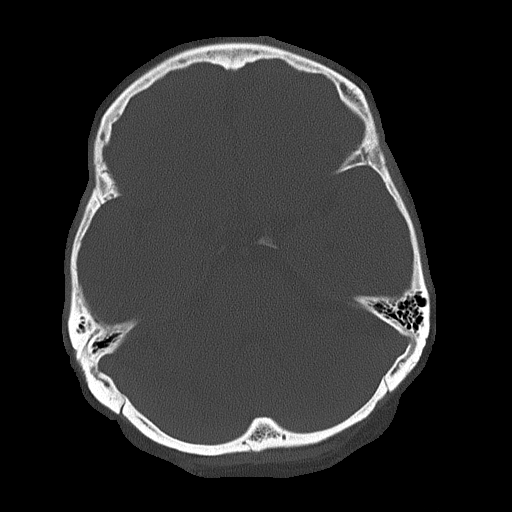

[Series 3: ax head wo · axial · 0.32mm/px · z∈[-85,+18]mm · 6 of 31 slices shown, 8 images]
[im 5/31  brain]
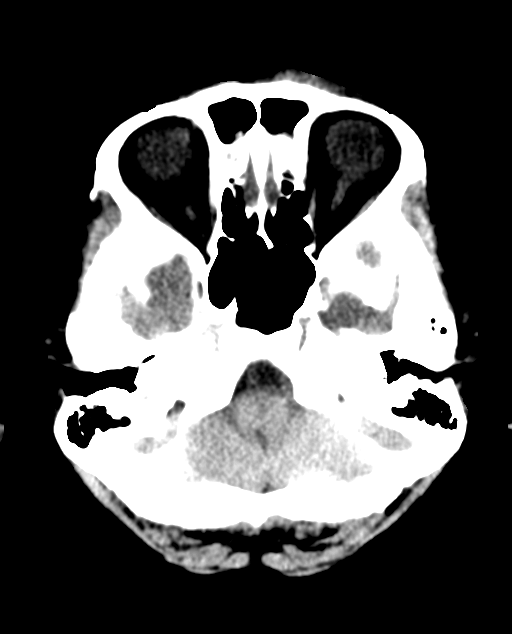
[im 5/31  bone]
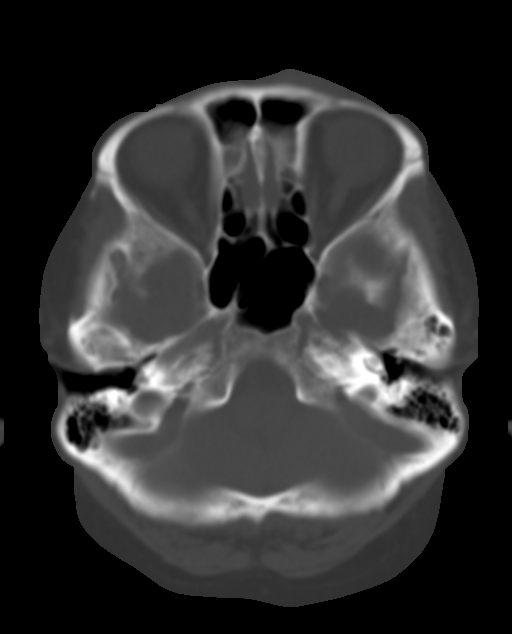
[im 9/31  brain]
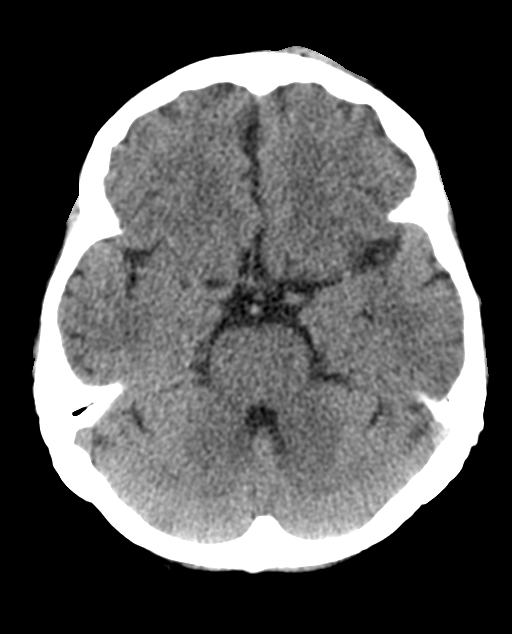
[im 13/31  brain]
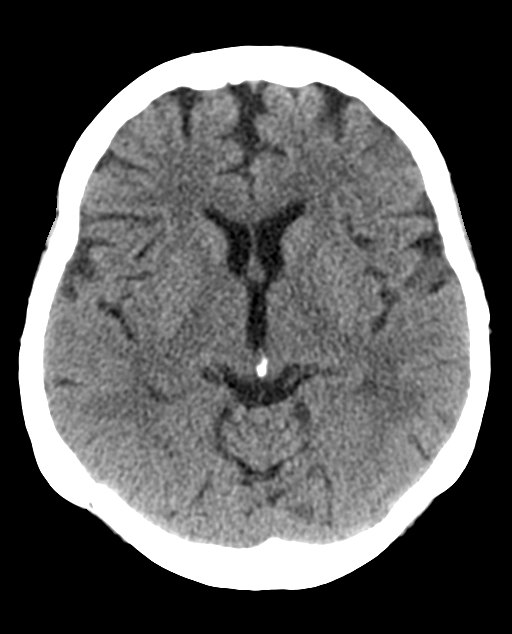
[im 18/31  brain]
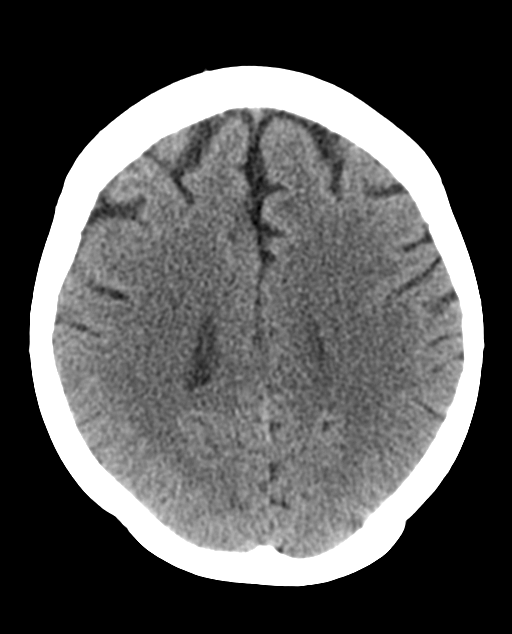
[im 22/31  brain]
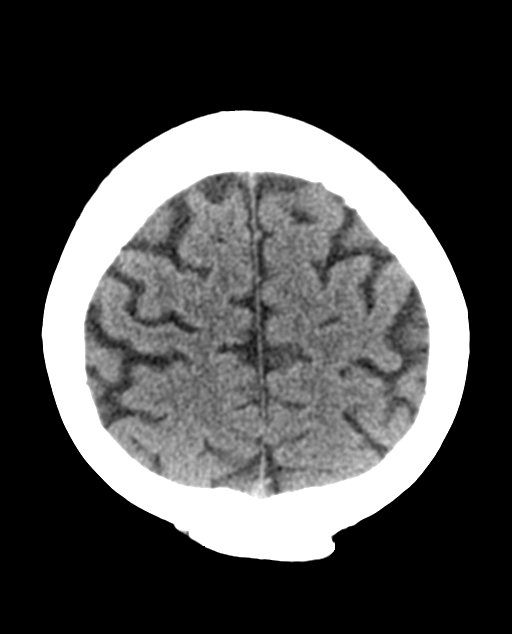
[im 22/31  bone]
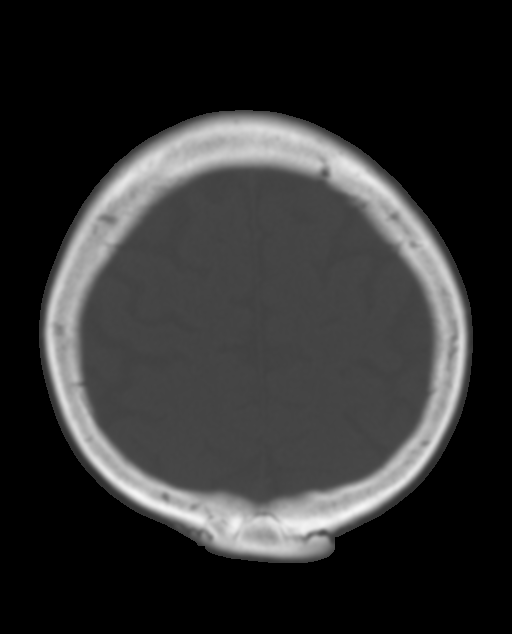
[im 26/31  brain]
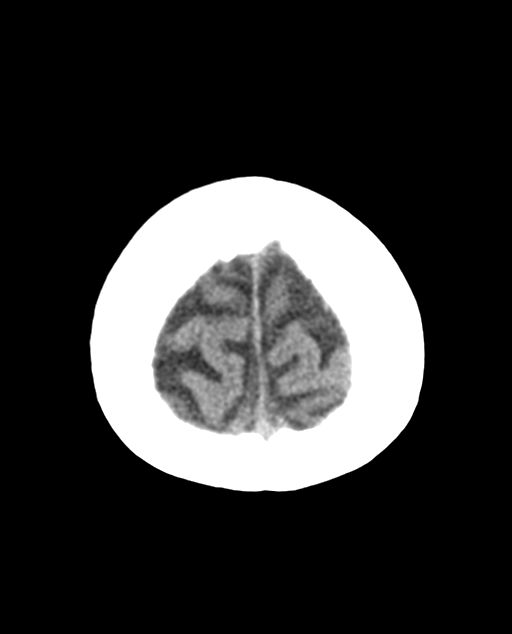

[Series 4: coronal soft tissue · coronal · 0.30mm/px · 3 of 60 slices shown]
[im 20/60  brain]
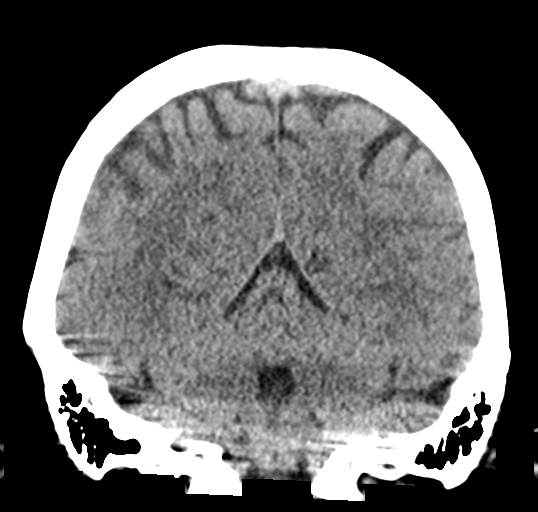
[im 27/60  brain]
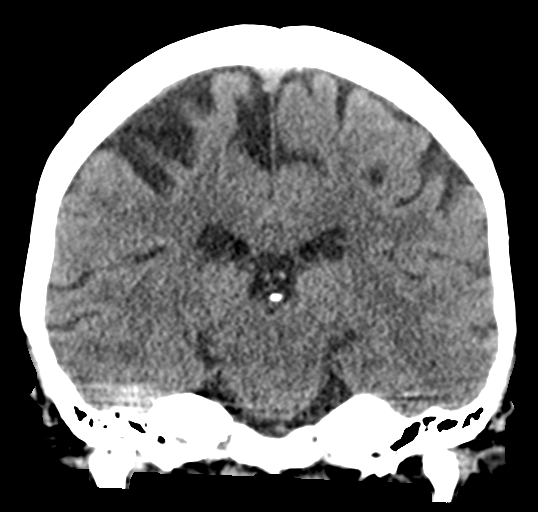
[im 33/60  brain]
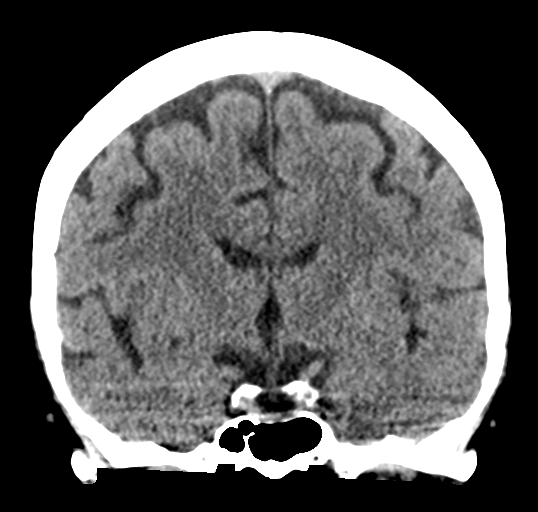

[Series 5: sagittal soft tissue · sagittal · 0.31mm/px · 3 of 52 slices shown]
[im 18/52  brain]
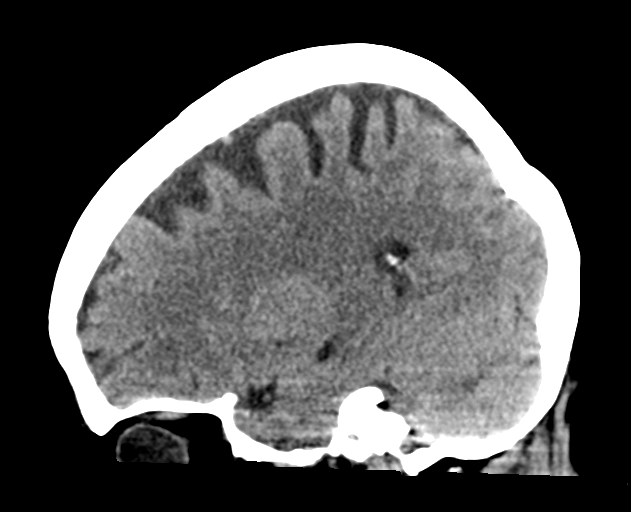
[im 26/52  brain]
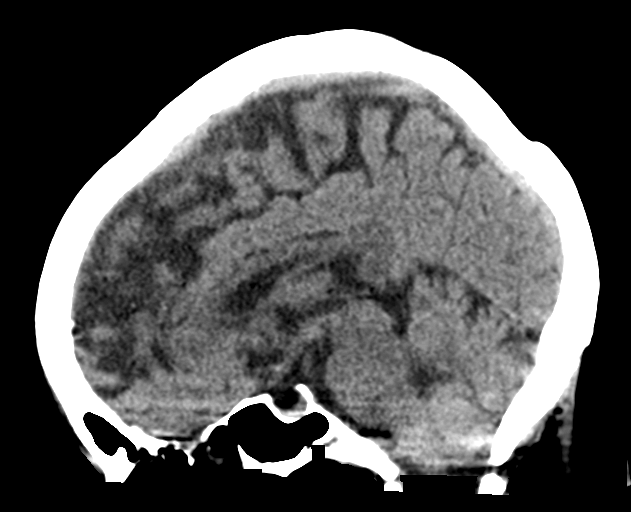
[im 35/52  brain]
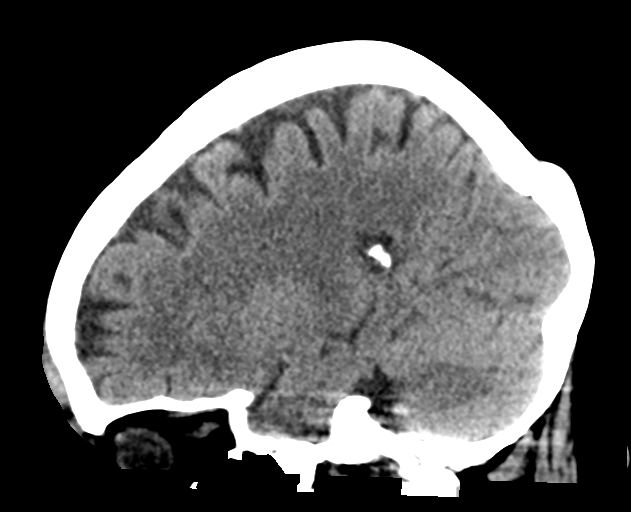

[14 of 47 positions shown; findings below may reference images not displayed]

FINDINGS: CT HEAD FINDINGS

Brain: No acute territorial infarction, hemorrhage or intracranial
mass. The ventricles are nonenlarged.

Vascular: No hyperdense vessels.  Carotid vascular calcification

Skull: Normal. Negative for fracture or focal lesion.

Sinuses/Orbits: No acute finding.

Other: Left forehead scalp hematoma

CT CERVICAL SPINE FINDINGS

Alignment: Trace anterolisthesis C3 on C4 and C4 on C5. Facet
alignment maintained.

Skull base and vertebrae: No acute fracture. No primary bone lesion
or focal pathologic process.

Soft tissues and spinal canal: No prevertebral fluid or swelling. No
visible canal hematoma.

Disc levels: Moderate disc space narrowing C4-C5, C5-C6 and C6-C7.
Facet degenerative changes at multiple levels.

Upper chest: Negative.

Other: 2.5 cm hypodense nodule in the left lobe of thyroid. Noted to
be PET negative 05/09/2019. present on 9024 comparison.
IMPRESSION: 1. No CT evidence for acute intracranial abnormality. Moderate left
forehead scalp hematoma
2. No acute osseous abnormality of the cervical spine
3. 2.5 cm hypodense nodule left lobe of thyroid. Stability for
greater than 5 years implies benignity; no biopsy or followup
indicated (ref: [HOSPITAL]. [DATE]): 143-50).

## 2021-12-28 DIAGNOSIS — M7581 Other shoulder lesions, right shoulder: Secondary | ICD-10-CM | POA: Diagnosis not present

## 2021-12-28 DIAGNOSIS — M7521 Bicipital tendinitis, right shoulder: Secondary | ICD-10-CM | POA: Diagnosis not present

## 2021-12-28 DIAGNOSIS — M5412 Radiculopathy, cervical region: Secondary | ICD-10-CM | POA: Diagnosis not present

## 2021-12-28 DIAGNOSIS — M47812 Spondylosis without myelopathy or radiculopathy, cervical region: Secondary | ICD-10-CM | POA: Diagnosis not present

## 2021-12-29 DIAGNOSIS — M47812 Spondylosis without myelopathy or radiculopathy, cervical region: Secondary | ICD-10-CM | POA: Diagnosis not present

## 2021-12-29 DIAGNOSIS — M5412 Radiculopathy, cervical region: Secondary | ICD-10-CM | POA: Diagnosis not present

## 2021-12-29 DIAGNOSIS — M7521 Bicipital tendinitis, right shoulder: Secondary | ICD-10-CM | POA: Diagnosis not present

## 2021-12-29 DIAGNOSIS — M7581 Other shoulder lesions, right shoulder: Secondary | ICD-10-CM | POA: Diagnosis not present

## 2022-01-01 DIAGNOSIS — M810 Age-related osteoporosis without current pathological fracture: Secondary | ICD-10-CM | POA: Diagnosis not present

## 2022-01-07 ENCOUNTER — Ambulatory Visit
Admission: RE | Admit: 2022-01-07 | Discharge: 2022-01-07 | Disposition: A | Discharge status: Home / Self Care | Payer: Medicare HMO | Source: Ambulatory Visit | Attending: Radiation Oncology | Admitting: Radiation Oncology

## 2022-01-07 ENCOUNTER — Encounter: Payer: Self-pay | Admitting: Radiation Oncology

## 2022-01-07 VITALS — BP 134/78 | HR 74 | Temp 98.0°F | Resp 20

## 2022-01-07 DIAGNOSIS — R911 Solitary pulmonary nodule: Secondary | ICD-10-CM

## 2022-01-07 DIAGNOSIS — R918 Other nonspecific abnormal finding of lung field: Secondary | ICD-10-CM | POA: Diagnosis not present

## 2022-01-07 DIAGNOSIS — Z923 Personal history of irradiation: Secondary | ICD-10-CM | POA: Insufficient documentation

## 2022-01-07 DIAGNOSIS — Z85118 Personal history of other malignant neoplasm of bronchus and lung: Secondary | ICD-10-CM | POA: Insufficient documentation

## 2022-01-07 DIAGNOSIS — C3411 Malignant neoplasm of upper lobe, right bronchus or lung: Secondary | ICD-10-CM | POA: Diagnosis not present

## 2022-01-07 NOTE — Progress Notes (Signed)
Radiation Oncology Follow up Note  Name: Lindsey Thomas   Date:   01/07/2022 MRN:  [id number] DOB: [birth year]    This 81 y.o. female presents to the clinic today for 2-year follow-up status post SBRT to a right upper lobe for stage I non-small cell lung cancer.  REFERRING PROVIDER: Idelle Crouch, MD  HPI: Patient is a 81 year old female now at 2 years having completed SBRT to right upper lobe for stage I non-small cell lung cancer.  Seen today in routine follow-up she is doing well no change in her pulmonary status.  No cough hemoptysis or chest tightness..  She had a CT scan back in June showing posttreatment changes in the right upper lobe no findings suspicious for recurrent disease.  She does have a small 5 mm bilateral pulmonary nodules which are stable over time.  COMPLICATIONS OF TREATMENT: none  FOLLOW UP COMPLIANCE: keeps appointments   PHYSICAL EXAM:  BP 134/78 (BP Location: Right Wrist, Patient Position: Sitting, Cuff Size: Small)   Pulse 74   Temp 98 F (36.7 C) (Tympanic)   Resp 20  Well-developed well-nourished patient in NAD. HEENT reveals PERLA, EOMI, discs not visualized.  Oral cavity is clear. No oral mucosal lesions are identified. Neck is clear without evidence of cervical or supraclavicular adenopathy. Lungs are clear to A&P. Cardiac examination is essentially unremarkable with regular rate and rhythm without murmur rub or thrill. Abdomen is benign with no organomegaly or masses noted. Motor sensory and DTR levels are equal and symmetric in the upper and lower extremities. Cranial nerves II through XII are grossly intact. Proprioception is intact. No peripheral adenopathy or edema is identified. No motor or sensory levels are noted. Crude visual fields are within normal range.  RADIOLOGY RESULTS: CT scan reviewed compatible with above-stated findings  PLAN: Present time patient is doing well with no evidence of disease.  She is being followed by medical oncology  with repeat CT scans ordered in approximately 6 months.  I have asked to see her out in 1 year for follow-up and then will discontinue follow-up care.  Patient is to call with any concerns.  I would like to take this opportunity to thank you for allowing me to participate in the care of your patient.Noreene Filbert, MD

## 2022-01-12 ENCOUNTER — Encounter: Payer: Self-pay | Admitting: Emergency Medicine

## 2022-01-12 DIAGNOSIS — Z6837 Body mass index (BMI) 37.0-37.9, adult: Secondary | ICD-10-CM

## 2022-01-12 DIAGNOSIS — K5731 Diverticulosis of large intestine without perforation or abscess with bleeding: Secondary | ICD-10-CM | POA: Diagnosis not present

## 2022-01-12 DIAGNOSIS — J449 Chronic obstructive pulmonary disease, unspecified: Secondary | ICD-10-CM | POA: Insufficient documentation

## 2022-01-12 DIAGNOSIS — Z831 Family history of other infectious and parasitic diseases: Secondary | ICD-10-CM | POA: Diagnosis not present

## 2022-01-12 DIAGNOSIS — K5730 Diverticulosis of large intestine without perforation or abscess without bleeding: Secondary | ICD-10-CM | POA: Diagnosis not present

## 2022-01-12 DIAGNOSIS — E039 Hypothyroidism, unspecified: Secondary | ICD-10-CM | POA: Diagnosis present

## 2022-01-12 DIAGNOSIS — J45909 Unspecified asthma, uncomplicated: Secondary | ICD-10-CM | POA: Diagnosis present

## 2022-01-12 DIAGNOSIS — Z85118 Personal history of other malignant neoplasm of bronchus and lung: Secondary | ICD-10-CM

## 2022-01-12 DIAGNOSIS — Z885 Allergy status to narcotic agent status: Secondary | ICD-10-CM | POA: Diagnosis not present

## 2022-01-12 DIAGNOSIS — Z923 Personal history of irradiation: Secondary | ICD-10-CM

## 2022-01-12 DIAGNOSIS — Z7951 Long term (current) use of inhaled steroids: Secondary | ICD-10-CM

## 2022-01-12 DIAGNOSIS — K625 Hemorrhage of anus and rectum: Secondary | ICD-10-CM | POA: Diagnosis present

## 2022-01-12 DIAGNOSIS — E7800 Pure hypercholesterolemia, unspecified: Secondary | ICD-10-CM | POA: Diagnosis not present

## 2022-01-12 DIAGNOSIS — Z888 Allergy status to other drugs, medicaments and biological substances status: Secondary | ICD-10-CM

## 2022-01-12 DIAGNOSIS — G4730 Sleep apnea, unspecified: Secondary | ICD-10-CM | POA: Diagnosis not present

## 2022-01-12 DIAGNOSIS — Z791 Long term (current) use of non-steroidal anti-inflammatories (NSAID): Secondary | ICD-10-CM | POA: Diagnosis not present

## 2022-01-12 DIAGNOSIS — I10 Essential (primary) hypertension: Secondary | ICD-10-CM | POA: Diagnosis not present

## 2022-01-12 DIAGNOSIS — Z803 Family history of malignant neoplasm of breast: Secondary | ICD-10-CM

## 2022-01-12 DIAGNOSIS — J439 Emphysema, unspecified: Secondary | ICD-10-CM | POA: Diagnosis not present

## 2022-01-12 DIAGNOSIS — K219 Gastro-esophageal reflux disease without esophagitis: Secondary | ICD-10-CM | POA: Diagnosis not present

## 2022-01-12 DIAGNOSIS — Z825 Family history of asthma and other chronic lower respiratory diseases: Secondary | ICD-10-CM

## 2022-01-12 DIAGNOSIS — M1990 Unspecified osteoarthritis, unspecified site: Secondary | ICD-10-CM | POA: Diagnosis not present

## 2022-01-12 DIAGNOSIS — I728 Aneurysm of other specified arteries: Secondary | ICD-10-CM | POA: Diagnosis present

## 2022-01-12 DIAGNOSIS — Z881 Allergy status to other antibiotic agents status: Secondary | ICD-10-CM

## 2022-01-12 DIAGNOSIS — Z818 Family history of other mental and behavioral disorders: Secondary | ICD-10-CM | POA: Diagnosis not present

## 2022-01-12 DIAGNOSIS — Z808 Family history of malignant neoplasm of other organs or systems: Secondary | ICD-10-CM

## 2022-01-12 DIAGNOSIS — Z801 Family history of malignant neoplasm of trachea, bronchus and lung: Secondary | ICD-10-CM

## 2022-01-12 DIAGNOSIS — K922 Gastrointestinal hemorrhage, unspecified: Secondary | ICD-10-CM | POA: Diagnosis not present

## 2022-01-12 DIAGNOSIS — R001 Bradycardia, unspecified: Secondary | ICD-10-CM | POA: Diagnosis not present

## 2022-01-12 DIAGNOSIS — E669 Obesity, unspecified: Secondary | ICD-10-CM | POA: Diagnosis not present

## 2022-01-12 DIAGNOSIS — Z86718 Personal history of other venous thrombosis and embolism: Secondary | ICD-10-CM

## 2022-01-12 DIAGNOSIS — I890 Lymphedema, not elsewhere classified: Secondary | ICD-10-CM | POA: Diagnosis present

## 2022-01-12 DIAGNOSIS — Z7982 Long term (current) use of aspirin: Secondary | ICD-10-CM

## 2022-01-12 DIAGNOSIS — Z79899 Other long term (current) drug therapy: Secondary | ICD-10-CM

## 2022-01-12 DIAGNOSIS — K921 Melena: Secondary | ICD-10-CM | POA: Insufficient documentation

## 2022-01-12 DIAGNOSIS — C3490 Malignant neoplasm of unspecified part of unspecified bronchus or lung: Secondary | ICD-10-CM | POA: Insufficient documentation

## 2022-01-12 LAB — COMPREHENSIVE METABOLIC PANEL
ALT: 15 U/L (ref 0–44)
AST: 18 U/L (ref 15–41)
Albumin: 3.9 g/dL (ref 3.5–5.0)
Alkaline Phosphatase: 102 U/L (ref 38–126)
Anion gap: 8 (ref 5–15)
BUN: 25 mg/dL — ABNORMAL HIGH (ref 8–23)
CO2: 25 mmol/L (ref 22–32)
Calcium: 8.6 mg/dL — ABNORMAL LOW (ref 8.9–10.3)
Chloride: 99 mmol/L (ref 98–111)
Creatinine, Ser: 0.87 mg/dL (ref 0.44–1.00)
GFR, Estimated: >60 mL/min (ref >60)
Glucose, Bld: 128 mg/dL — ABNORMAL HIGH (ref 70–99)
Potassium: 4.2 mmol/L (ref 3.5–5.1)
Sodium: 132 mmol/L — ABNORMAL LOW (ref 135–145)
Total Bilirubin: 0.6 mg/dL (ref 0.3–1.2)
Total Protein: 6.1 g/dL — ABNORMAL LOW (ref 6.5–8.1)

## 2022-01-12 LAB — CBC
HCT: 38.7 % (ref 36.0–46.0)
Hemoglobin: 12.8 g/dL (ref 12.0–15.0)
MCH: 29.6 pg (ref 26.0–34.0)
MCHC: 33.1 g/dL (ref 30.0–36.0)
MCV: 89.4 fL (ref 80.0–100.0)
Platelets: 251 10*3/uL (ref 150–400)
RBC: 4.33 MIL/uL (ref 3.87–5.11)
RDW: 14.2 % (ref 11.5–15.5)
WBC: 8.4 10*3/uL (ref 4.0–10.5)
nRBC: 0 % (ref 0.0–0.2)

## 2022-01-12 NOTE — ED Triage Notes (Signed)
Pt presents via POV with complaints of 2 episodes of bright red bloody stools. Pt endorses abdominal discomfort but no pain with associated nausea. Denies emesis, dizziness, or weakness.

## 2022-01-13 ENCOUNTER — Inpatient Hospital Stay
Admission: EM | Admit: 2022-01-13 | Discharge: 2022-01-14 | DRG: 379 | Disposition: A | Discharge status: Home / Self Care | Payer: Medicare Other | Attending: Hospitalist | Admitting: Hospitalist

## 2022-01-13 ENCOUNTER — Encounter: Payer: Self-pay | Admitting: Internal Medicine

## 2022-01-13 ENCOUNTER — Emergency Department: Payer: Medicare Other

## 2022-01-13 ENCOUNTER — Other Ambulatory Visit: Payer: Self-pay

## 2022-01-13 ENCOUNTER — Inpatient Hospital Stay: Payer: Medicare Other

## 2022-01-13 DIAGNOSIS — R110 Nausea: Secondary | ICD-10-CM

## 2022-01-13 DIAGNOSIS — J4520 Mild intermittent asthma, uncomplicated: Secondary | ICD-10-CM | POA: Diagnosis not present

## 2022-01-13 DIAGNOSIS — I728 Aneurysm of other specified arteries: Secondary | ICD-10-CM | POA: Diagnosis not present

## 2022-01-13 DIAGNOSIS — Z7951 Long term (current) use of inhaled steroids: Secondary | ICD-10-CM | POA: Diagnosis not present

## 2022-01-13 DIAGNOSIS — Z831 Family history of other infectious and parasitic diseases: Secondary | ICD-10-CM | POA: Diagnosis not present

## 2022-01-13 DIAGNOSIS — I890 Lymphedema, not elsewhere classified: Secondary | ICD-10-CM | POA: Diagnosis not present

## 2022-01-13 DIAGNOSIS — K921 Melena: Secondary | ICD-10-CM | POA: Diagnosis not present

## 2022-01-13 DIAGNOSIS — K219 Gastro-esophageal reflux disease without esophagitis: Secondary | ICD-10-CM | POA: Diagnosis present

## 2022-01-13 DIAGNOSIS — Z881 Allergy status to other antibiotic agents status: Secondary | ICD-10-CM | POA: Diagnosis not present

## 2022-01-13 DIAGNOSIS — Z791 Long term (current) use of non-steroidal anti-inflammatories (NSAID): Secondary | ICD-10-CM | POA: Diagnosis not present

## 2022-01-13 DIAGNOSIS — Z808 Family history of malignant neoplasm of other organs or systems: Secondary | ICD-10-CM | POA: Diagnosis not present

## 2022-01-13 DIAGNOSIS — J45909 Unspecified asthma, uncomplicated: Secondary | ICD-10-CM | POA: Diagnosis present

## 2022-01-13 DIAGNOSIS — Z923 Personal history of irradiation: Secondary | ICD-10-CM | POA: Diagnosis not present

## 2022-01-13 DIAGNOSIS — I10 Essential (primary) hypertension: Secondary | ICD-10-CM | POA: Diagnosis not present

## 2022-01-13 DIAGNOSIS — E871 Hypo-osmolality and hyponatremia: Secondary | ICD-10-CM

## 2022-01-13 DIAGNOSIS — Z803 Family history of malignant neoplasm of breast: Secondary | ICD-10-CM | POA: Diagnosis not present

## 2022-01-13 DIAGNOSIS — Z888 Allergy status to other drugs, medicaments and biological substances status: Secondary | ICD-10-CM | POA: Diagnosis not present

## 2022-01-13 DIAGNOSIS — Z818 Family history of other mental and behavioral disorders: Secondary | ICD-10-CM | POA: Diagnosis not present

## 2022-01-13 DIAGNOSIS — M1990 Unspecified osteoarthritis, unspecified site: Secondary | ICD-10-CM | POA: Diagnosis not present

## 2022-01-13 DIAGNOSIS — K625 Hemorrhage of anus and rectum: Secondary | ICD-10-CM | POA: Diagnosis present

## 2022-01-13 DIAGNOSIS — R911 Solitary pulmonary nodule: Secondary | ICD-10-CM | POA: Diagnosis present

## 2022-01-13 DIAGNOSIS — E039 Hypothyroidism, unspecified: Secondary | ICD-10-CM

## 2022-01-13 DIAGNOSIS — E669 Obesity, unspecified: Secondary | ICD-10-CM | POA: Diagnosis not present

## 2022-01-13 DIAGNOSIS — R109 Unspecified abdominal pain: Secondary | ICD-10-CM

## 2022-01-13 DIAGNOSIS — G4730 Sleep apnea, unspecified: Secondary | ICD-10-CM | POA: Diagnosis not present

## 2022-01-13 DIAGNOSIS — K922 Gastrointestinal hemorrhage, unspecified: Secondary | ICD-10-CM | POA: Diagnosis not present

## 2022-01-13 DIAGNOSIS — C3490 Malignant neoplasm of unspecified part of unspecified bronchus or lung: Secondary | ICD-10-CM | POA: Diagnosis present

## 2022-01-13 DIAGNOSIS — K5790 Diverticulosis of intestine, part unspecified, without perforation or abscess without bleeding: Secondary | ICD-10-CM

## 2022-01-13 DIAGNOSIS — K5731 Diverticulosis of large intestine without perforation or abscess with bleeding: Secondary | ICD-10-CM | POA: Diagnosis not present

## 2022-01-13 DIAGNOSIS — J439 Emphysema, unspecified: Secondary | ICD-10-CM | POA: Diagnosis not present

## 2022-01-13 DIAGNOSIS — E7800 Pure hypercholesterolemia, unspecified: Secondary | ICD-10-CM | POA: Diagnosis not present

## 2022-01-13 DIAGNOSIS — K5730 Diverticulosis of large intestine without perforation or abscess without bleeding: Secondary | ICD-10-CM | POA: Diagnosis not present

## 2022-01-13 DIAGNOSIS — Z801 Family history of malignant neoplasm of trachea, bronchus and lung: Secondary | ICD-10-CM | POA: Diagnosis not present

## 2022-01-13 DIAGNOSIS — Z885 Allergy status to narcotic agent status: Secondary | ICD-10-CM | POA: Diagnosis not present

## 2022-01-13 DIAGNOSIS — Z6837 Body mass index (BMI) 37.0-37.9, adult: Secondary | ICD-10-CM | POA: Diagnosis not present

## 2022-01-13 LAB — TYPE AND SCREEN
ABO/RH(D): O POS
Antibody Screen: NEGATIVE

## 2022-01-13 LAB — CBC
HCT: 35 % — ABNORMAL LOW (ref 36.0–46.0)
HCT: 33.2 % — ABNORMAL LOW (ref 36.0–46.0)
Hemoglobin: 11.6 g/dL — ABNORMAL LOW (ref 12.0–15.0)
Hemoglobin: 11.1 g/dL — ABNORMAL LOW (ref 12.0–15.0)
MCH: 29.6 pg (ref 26.0–34.0)
MCH: 29.5 pg (ref 26.0–34.0)
MCHC: 33.1 g/dL (ref 30.0–36.0)
MCHC: 33.4 g/dL (ref 30.0–36.0)
MCV: 89.3 fL (ref 80.0–100.0)
MCV: 88.3 fL (ref 80.0–100.0)
Platelets: 166 10*3/uL (ref 150–400)
Platelets: 214 10*3/uL (ref 150–400)
RBC: 3.92 MIL/uL (ref 3.87–5.11)
RBC: 3.76 MIL/uL — ABNORMAL LOW (ref 3.87–5.11)
RDW: 14.1 % (ref 11.5–15.5)
RDW: 14.1 % (ref 11.5–15.5)
WBC: 10 10*3/uL (ref 4.0–10.5)
WBC: 11.6 10*3/uL — ABNORMAL HIGH (ref 4.0–10.5)
nRBC: 0 % (ref 0.0–0.2)
nRBC: 0 % (ref 0.0–0.2)

## 2022-01-13 LAB — APTT: aPTT: 21 seconds — ABNORMAL LOW (ref 24–36)

## 2022-01-13 LAB — HEMOGLOBIN AND HEMATOCRIT, BLOOD
HCT: 37.3 % (ref 36.0–46.0)
Hemoglobin: 12.3 g/dL (ref 12.0–15.0)

## 2022-01-13 LAB — PROTIME-INR
INR: 1 (ref 0.8–1.2)
Prothrombin Time: 13.1 seconds (ref 11.4–15.2)

## 2022-01-13 MED ORDER — MONTELUKAST SODIUM 10 MG PO TABS
10.0000 mg | ORAL_TABLET | Freq: Every day | ORAL | Status: DC
Start: 2022-01-13 — End: 2022-01-15
  Administered 2022-01-13 – 2022-01-14 (×2): 10 mg via ORAL
  Filled 2022-01-13 (×2): qty 1

## 2022-01-13 MED ORDER — MOMETASONE FURO-FORMOTEROL FUM 200-5 MCG/ACT IN AERO
2.0000 | INHALATION_SPRAY | Freq: Two times a day (BID) | RESPIRATORY_TRACT | Status: DC
  Administered 2022-01-14 – 2022-01-15 (×3): 2 via RESPIRATORY_TRACT
  Filled 2022-01-13: qty 8.8

## 2022-01-13 MED ORDER — SODIUM CHLORIDE 0.9 % IV SOLN: INTRAVENOUS | Status: DC

## 2022-01-13 MED ORDER — FLUTICASONE PROPIONATE 50 MCG/ACT NA SUSP: 2.0000 | Freq: Every day | NASAL | Status: DC | PRN

## 2022-01-13 MED ORDER — MORPHINE SULFATE (PF) 2 MG/ML IV SOLN
1.0000 mg | INTRAVENOUS | Status: DC | PRN
  Administered 2022-01-13 – 2022-01-15 (×3): 1 mg via INTRAVENOUS
  Filled 2022-01-13 (×3): qty 1

## 2022-01-13 MED ORDER — ALBUTEROL SULFATE (2.5 MG/3ML) 0.083% IN NEBU: 3.0000 mL | INHALATION_SOLUTION | RESPIRATORY_TRACT | Status: DC | PRN

## 2022-01-13 MED ORDER — PEG 3350-KCL-NA BICARB-NACL 420 G PO SOLR
4000.0000 mL | Freq: Once | ORAL | Status: AC
  Administered 2022-01-13: 4000 mL via ORAL
  Filled 2022-01-13: qty 4000

## 2022-01-13 MED ORDER — ONDANSETRON HCL 4 MG/2ML IJ SOLN
4.0000 mg | Freq: Three times a day (TID) | INTRAMUSCULAR | Status: DC | PRN
  Administered 2022-01-13 – 2022-01-15 (×4): 4 mg via INTRAVENOUS
  Filled 2022-01-13 (×5): qty 2

## 2022-01-13 MED ORDER — VITAMIN D 25 MCG (1000 UNIT) PO TABS
5000.0000 [IU] | ORAL_TABLET | Freq: Every day | ORAL | Status: DC
  Administered 2022-01-15: 5000 [IU] via ORAL
  Filled 2022-01-13: qty 5

## 2022-01-13 MED ORDER — POLYVINYL ALCOHOL 1.4 % OP SOLN
1.0000 [drp] | Freq: Two times a day (BID) | OPHTHALMIC | Status: DC
  Administered 2022-01-15: 1 [drp] via OPHTHALMIC
  Filled 2022-01-13: qty 15

## 2022-01-13 MED ORDER — HYDRALAZINE HCL 20 MG/ML IJ SOLN
5.0000 mg | INTRAMUSCULAR | Status: DC | PRN
Start: 2022-01-13 — End: 2022-01-15

## 2022-01-13 MED ORDER — OCUVITE-LUTEIN PO CAPS
1.0000 | ORAL_CAPSULE | Freq: Two times a day (BID) | ORAL | Status: DC
  Administered 2022-01-14 – 2022-01-15 (×2): 1 via ORAL
  Filled 2022-01-13 (×5): qty 1

## 2022-01-13 MED ORDER — IOHEXOL 300 MG/ML  SOLN
100.0000 mL | Freq: Once | INTRAMUSCULAR | Status: AC | PRN
  Administered 2022-01-13: 100 mL via INTRAVENOUS

## 2022-01-13 MED ORDER — CALCIUM CARBONATE ANTACID 500 MG PO CHEW
2.0000 | CHEWABLE_TABLET | Freq: Every day | ORAL | Status: DC
Start: 2022-01-13 — End: 2022-01-15
  Administered 2022-01-15: 400 mg via ORAL
  Filled 2022-01-13: qty 2

## 2022-01-13 MED ORDER — DM-GUAIFENESIN ER 30-600 MG PO TB12: 1.0000 | ORAL_TABLET | Freq: Two times a day (BID) | ORAL | Status: DC | PRN

## 2022-01-13 MED ORDER — IOHEXOL 350 MG/ML SOLN
100.0000 mL | Freq: Once | INTRAVENOUS | Status: AC | PRN
  Administered 2022-01-13: 100 mL via INTRAVENOUS

## 2022-01-13 MED ORDER — ACETAMINOPHEN 325 MG PO TABS: 650.0000 mg | ORAL_TABLET | Freq: Four times a day (QID) | ORAL | Status: DC | PRN

## 2022-01-13 MED ORDER — LORATADINE 10 MG PO TABS
10.0000 mg | ORAL_TABLET | Freq: Every day | ORAL | Status: DC
  Administered 2022-01-15: 10 mg via ORAL
  Filled 2022-01-13: qty 1

## 2022-01-13 MED ORDER — PANTOPRAZOLE SODIUM 40 MG PO TBEC
40.0000 mg | DELAYED_RELEASE_TABLET | Freq: Two times a day (BID) | ORAL | Status: DC
  Administered 2022-01-13 – 2022-01-15 (×3): 40 mg via ORAL
  Filled 2022-01-13 (×3): qty 1

## 2022-01-13 MED ORDER — VITAMIN B-12 1000 MCG PO TABS
1000.0000 ug | ORAL_TABLET | Freq: Every day | ORAL | Status: DC
  Administered 2022-01-15: 1000 ug via ORAL
  Filled 2022-01-13 (×2): qty 1

## 2022-01-13 MED ORDER — PANTOPRAZOLE SODIUM 40 MG PO TBEC
40.0000 mg | DELAYED_RELEASE_TABLET | Freq: Every day | ORAL | Status: DC
  Administered 2022-01-13: 40 mg via ORAL
  Filled 2022-01-13: qty 1

## 2022-01-13 NOTE — H&P (View-Only) (Signed)
   GI Inpatient Short-Progress Note   CTA negative for any active bleed. I reviewed results with patient and husband over the phone this afternoon. We will plan on colonoscopy tomorrow for further evaluation. Clear liquid diet today with NPO after midnight. Bowel prep orders in.   Further recommendations after colonoscopy   I reviewed the risks (including bleeding, perforation, infection, anesthesia complications, cardiac/respiratory complications), benefits and alternatives of colonoscopy . Patient consents to proceed.    Octavia Bruckner, PA-C [facility] [phone]

## 2022-01-13 NOTE — Assessment & Plan Note (Addendum)
  BMI= 37   and BW= 77.6 -Diet and exercise.   -Encourage to lose weight.

## 2022-01-13 NOTE — Assessment & Plan Note (Signed)
Pt is not taking medications currently.  TSH 4.396 on 08/06/21 -Follow-up with PCP

## 2022-01-13 NOTE — Assessment & Plan Note (Signed)
-  see above 

## 2022-01-13 NOTE — Assessment & Plan Note (Signed)
Hold Lasix for right now.

## 2022-01-13 NOTE — ED Notes (Signed)
Pilar Plate blood noted during rectal exam by MD.

## 2022-01-13 NOTE — ED Notes (Signed)
Pt brought to ED 4 at this time. This RN now assuming care.

## 2022-01-13 NOTE — H&P (View-Only) (Signed)
GI Inpatient Consult Note  Reason for Consult: LGI bleed   Attending Requesting Consult: Dr. Ivor Costa  History of Present Illness: Lindsey Thomas is a 81 y.o. female seen for evaluation of painless hematochezia 2/2 acute LGI bleeding at the request of admitting hospitalist - Dr. Ivor Costa. Patient has a PMH of HTN, HLD, asthma, GERD, obesity, hypothyroidism, NSCLC s/p radiation therapy, Hx of DVT NOT on chronic anticoagulation, and lymphedema. She presented to the [facility] ED yesterday evening from home for complaints of 2 episodes of painless hematochezia. She reports first episode started around 8 PM last night about an hour after dinner and was bright red to dark red in color. She had another episode within the next hour and was immediately brought to the ED for further evaluation. She had to wait in the ED for 7 hours before being seen. She had additional episode of hematochezia in the ED waiting room and then an additional episode around 4 AM this morning. Upon presentation to the ED, she was hypertensive but otherwise stable vital signs. Blood work was significant for hemoglobin 12.3 (was 13.4 six weeks ago), sodium 132, otherwise normal serum electrolytes and normal liver enzymes. CT abd/pelvis with contrast showed extensive colonic diverticulosis without evidence for acute diverticulitis and no other acute findings. She was admitted to [place] as inpatient and GI consult was placed.   Patient seen and examined around 12 PM today resting comfortably in hospital bed. Her husband and daughter are present bedside. She reports she had another large volume episode of hematochezia around 11 AM this morning. In total she has had 5 episodes since 8 PM last night. There is no melanotic stool. Per nursing staff, there was evidence of bright red and maroon colored stool with clots with a foul smell. Patient reports when symptoms first started she wasn't having abdominal pain, but this morning she has some  abdominal discomfort in epigastrium and LLQ. She denies any fevers, chills, or sweats. She has had some nausea and two episodes of NBNB emesis this morning. She has had some dry heaving. Repeat hemoglobin 11.6 around 11:30 AM. She denies any history of GI bleeding. She does take Mobic 7.5 mg daily for pain and ASA 81 mg daily. Last colonoscopy 05/2013 performed by Dr. Vira Agar showed sigmoid diverticulosis and otherwise normal examined colon.    Past Medical History:  Past Medical History:  Diagnosis Date   Anxiety    Arthritis    Asthma    DVT (deep venous thrombosis) (HCC)    Edema    feet   Emphysema of lung (HCC)    GERD (gastroesophageal reflux disease)    Headache    MIGRAINES   History of orthopnea    Hypercholesterolemia    Hypothyroidism    NODULES   Lymphedema    Migraine    Non-small cell lung cancer, right ([place]) 05/2019   Rad tx's   Shortness of breath dyspnea    WITH EXERTION   Sleep apnea    MILD, DOES NOT USE CPAP   Thyroid nodule     Problem List: Patient Active Problem List   Diagnosis Date Noted   Hematochezia 01/13/2022   GERD (gastroesophageal reflux disease)    Hypothyroidism    Asthma    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.0 to 39.9    Lung nodule    Lower limb ulcer, calf, left, limited to breakdown of skin ([place]) 05/08/2021   Acute deep vein thrombosis (DVT)  of proximal vein of left lower extremity ([place]) 01/01/2021   Tendinitis of upper biceps tendon of right shoulder 12/15/2020   Rotator cuff tendinitis, right 12/15/2020   Dyspnea and respiratory abnormalities 02/18/2020   Diverticulosis 11/19/2019   Lung cancer ([place]) 12/03/2018   Hypertension 04/26/2017   Varicose veins of leg with pain, 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   Leg swelling 04/23/2016   Lymphedema 04/23/2016   Venous stasis 12/23/2015   Thyroid nodule 06/25/2015   Anxiety 12/31/2013   Aphasia 12/31/2013   Obesity 12/31/2013   Derangement of posterior horn of medial meniscus 09/13/2013    Neck pain 09/13/2013   Pure hypercholesterolemia 09/13/2013   Disorder of bursae and tendons in shoulder region 09/13/2013    Past Surgical History: Past Surgical History:  Procedure Laterality Date   ABDOMINAL HYSTERECTOMY     BREAST BIOPSY     CARDIAC CATHETERIZATION     CATARACT EXTRACTION W/PHACO Left 02/12/2016   Procedure: CATARACT EXTRACTION PHACO AND INTRAOCULAR LENS PLACEMENT ([place]);  Surgeon: Eulogio Bear, MD;  Location: [facility] ORS;  Service: Ophthalmology;  Laterality: Left;  [place] 01:30 AP% 15.2 CDE 13.71 Fluid pack lot # [id number]   CATARACT EXTRACTION W/PHACO Right 03/18/2016   Procedure: CATARACT EXTRACTION PHACO AND INTRAOCULAR LENS PLACEMENT ([facility]);  Surgeon: Eulogio Bear, MD;  Location: [facility] ORS;  Service: Ophthalmology;  Laterality: Right;  Lot # [id number] H [place]: 01:05.5 AP%:14.3 CDE: 9.33   FRACTURE SURGERY Left    arm rod and screw   KNEE ARTHROSCOPY     OOPHORECTOMY     RCR     ROTATOR CUFF REPAIR Left 2006    Allergies: Allergies  Allergen Reactions   Eryc [Erythromycin]    Levofloxacin Nausea Only   Percocet [Oxycodone-Acetaminophen] Nausea And Vomiting   Augmentin [Amoxicillin-Pot Clavulanate] Rash   Celecoxib Palpitations    Home Medications: Medications Prior to Admission  Medication Sig Dispense Refill Last Dose   acetaminophen (TYLENOL) 500 MG tablet Take 1,000 mg by mouth daily as needed for pain.   prn at prn   aspirin EC 81 MG tablet Take 81 mg by mouth daily.   01/12/2022 at 0800   calcium carbonate (CALCIUM ANTACID) 500 MG chewable tablet Chew 2 tablets by mouth daily.   01/12/2022 at 0800   cetirizine (ZYRTEC) 10 MG tablet Take 10 mg by mouth at bedtime.    01/11/2022 at 1800   Cholecalciferol (VITAMIN D PO) Take 5,000 Units by mouth daily.   01/11/2022 at 1800   Cyanocobalamin (VITAMIN B-12 PO) Take 2 tablets by mouth daily.   01/11/2022 at 1800   fluticasone (FLONASE) 50 MCG/ACT nasal spray Place 2 sprays into both nostrils daily.   01/11/2022 at  1800   furosemide (LASIX) 20 MG tablet Take by mouth.    01/12/2022 at 0800   meloxicam (MOBIC) 7.5 MG tablet Take 1 tablet by mouth daily.   01/12/2022 at 0800   montelukast (SINGULAIR) 10 MG tablet Take 10 mg by mouth at bedtime.    01/11/2022 at 1800   Multiple Vitamins-Minerals (PRESERVISION AREDS PO) Take 1 capsule by mouth 2 (two) times daily.   01/12/2022 at 1800   omeprazole (PRILOSEC) 20 MG capsule Take 20 mg by mouth 2 (two) times daily before a meal.   01/12/2022 at 0800   Polyethyl Glycol-Propyl Glycol (SYSTANE) 0.4-0.3 % SOLN Apply 1 drop to eye 2 (two) times daily.   01/12/2022 at 0800   SYMBICORT 160-4.5 MCG/ACT inhaler Inhale 2 puffs into the lungs 2 (two) times  daily.   01/12/2022 at 0800   VENTOLIN HFA 108 (90 Base) MCG/ACT inhaler Inhale 2 puffs into the lungs as needed.   prn at prn   butalbital-acetaminophen-caffeine (FIORICET) 50-325-40 MG tablet Take by mouth 2 (two) times daily as needed for headache. (Patient not taking: Reported on 01/13/2022)   Not Taking   ibandronate (BONIVA) 150 MG tablet 150 mg. (Patient not taking: Reported on 01/07/2022)   Not Taking   Home medication reconciliation was completed with the patient.   Scheduled Inpatient Medications:    calcium carbonate  2 tablet Oral Daily   cholecalciferol  5,000 Units Oral Daily   vitamin B-12  1,000 mcg Oral Daily   loratadine  10 mg Oral Daily   [START ON 01/14/2022] mometasone-formoterol  2 puff Inhalation BID   montelukast  10 mg Oral QHS   multivitamin-lutein  1 capsule Oral BID   pantoprazole  40 mg Oral BID   [START ON 01/14/2022] polyvinyl alcohol  1 drop Both Eyes BID    Continuous Inpatient Infusions:    sodium chloride 75 mL/hr at 01/13/22 0932    PRN Inpatient Medications:  acetaminophen, albuterol, dextromethorphan-guaiFENesin, fluticasone, hydrALAZINE, ondansetron (ZOFRAN) IV  Family History: family history includes Brain cancer in her father; Breast cancer in her sister; Dementia in her sister;  Emphysema in her mother; Heart Problems in her mother; Irritable bowel syndrome in her sister; Lung cancer in her brother; Schizophrenia in her sister; Skin cancer in her father; Tuberculosis in her father.  The patient's family history is negative for inflammatory bowel disorders, GI malignancy, or solid organ transplantation.  Social History:   reports that she has never smoked. She has never used smokeless tobacco. She reports that she does not drink alcohol and does not use drugs. The patient denies ETOH, tobacco, or drug use.   Review of Systems: Constitutional: Weight is stable.  Eyes: No changes in vision. ENT: No oral lesions, sore throat.  GI: see HPI.  Heme/Lymph: No easy bruising.  CV: No chest pain.  GU: No hematuria.  Integumentary: No rashes.  Neuro: No headaches.  Psych: No depression/anxiety.  Endocrine: No heat/cold intolerance.  Allergic/Immunologic: No urticaria.  Resp: No cough, SOB.  Musculoskeletal: No joint swelling.    Physical Examination: BP 131/65 (BP Location: Right Arm)   Pulse 84   Temp 97.6 F (36.4 C) (Oral)   Resp 16   Ht 4\' 9"  (1.448 m)   Wt 77.6 kg   SpO2 99%   BMI 37.00 kg/m  Gen: NAD, alert and oriented x 4 HEENT: PEERLA, EOMI, Neck: supple, no JVD or thyromegaly Chest: CTA bilaterally, no wheezes, crackles, or other adventitious sounds CV: RRR, no m/g/c/r Abd: soft, mild distention, +BS in all four quadrants; tender to deep palpation in LLQ and suprapubic regions, no fluid wave, no HSM, guarding, ridigity, or rebound tenderness Ext: no edema, well perfused with 2+ pulses, Skin: no rash or lesions noted Lymph: no LAD  Data: Lab Results  Component Value Date   WBC 10.0 01/13/2022   HGB 11.6 (L) 01/13/2022   HCT 35.0 (L) 01/13/2022   MCV 89.3 01/13/2022   PLT 166 01/13/2022   Recent Labs  Lab 01/12/22 2051 01/13/22 0437 01/13/22 1138  HGB 12.8 12.3 11.6*   Lab Results  Component Value Date   NA 132 (L) 01/12/2022   K 4.2  01/12/2022   CL 99 01/12/2022   CO2 25 01/12/2022   BUN 25 (H) 01/12/2022   CREATININE 0.87 01/12/2022  Lab Results  Component Value Date   ALT 15 01/12/2022   AST 18 01/12/2022   ALKPHOS 102 01/12/2022   BILITOT 0.6 01/12/2022   Recent Labs  Lab 01/13/22 1138  INR 1.0    Assessment/Plan:  81 y/o Caucasian female with a PMH of HTN, HLD, asthma, GERD, obesity, hypothyroidism, NSCLC s/p radiation therapy, Hx of DVT NOT on chronic anticoagulation, and lymphedema presented to the [facility] ED last night for chief complaint of painless hematochezia   Acute LGI bleed - clinical presentation is most c/w diverticular bleed, other considerations are AVMs, hemorrhoidal bleeding, colon polyps, neoplasm, ischemic colitis, IBD, etc  Colonic diverticulosis without evidence of diverticulitis   NSCLC s/p radiation therapy - follows with Dr. Rao/Dr. Baruch Gouty   Obesity  Recommendations:  - Concerned for acute LGI bleed since she continues to have hematochezia - Hemodynamically stable currently - Maintain 2 large bore IVs for access - Continue to monitor serial H&H. Transfuse for Hgb <7.0.  - I have ordered CTA abd/pelvis w/wo contrast to assess for acute GI bleeding this afternoon. If positive, will consult vascular surgery for consideration of embolization. If negative, will need colonoscopy tomorrow for luminal evaluation - NPO status  - Further recommendations after CTA - Patient and family are in agreement with above plan of care. I answered all of their questions to best of my ability.    Thank you for the consult. Please call with questions or concerns.  Geanie Kenning, PA-C [facility] [phone]

## 2022-01-13 NOTE — ED Provider Notes (Signed)
[facility] Provider Note    Event Date/Time   First MD Initiated Contact with Patient 01/13/22 [phone]     (approximate)   History   Rectal Bleeding   HPI  Lindsey Thomas is a 81 y.o. female with past medical history of GERD, DVT, non-small cell lung cancer, sleep apnea who presents with rectal bleeding.  Just prior to arrival patient felt like she had to have a bowel movement but was unable to make it to the toilet.  She noticed that there was bright red blood in it.  She then filled the toilet with blood.  She had 1 additional episode.  Thinks she may have seen some clots as well.  She endorses some generalized abdominal discomfort she diarrhea.  No history of similar no nausea vomiting hematemesis.  No other bleeding.  She is not on a blood thinner.  Denies fevers.    Past Medical History:  Diagnosis Date   Anxiety    Arthritis    Asthma    DVT (deep venous thrombosis) (HCC)    Edema    feet   Emphysema of lung (HCC)    GERD (gastroesophageal reflux disease)    Headache    MIGRAINES   History of orthopnea    Hypercholesterolemia    Hypothyroidism    NODULES   Lymphedema    Migraine    Non-small cell lung cancer, right ([place]) 05/2019   Rad tx's   Shortness of breath dyspnea    WITH EXERTION   Sleep apnea    MILD, DOES NOT USE CPAP   Thyroid nodule     Patient Active Problem List   Diagnosis Date Noted   Lower limb ulcer, calf, left, limited to breakdown of skin ([place]) 05/08/2021   Acute deep vein thrombosis (DVT) of proximal vein of left lower extremity ([place]) 01/01/2021   Tendinitis of upper biceps tendon of right shoulder 12/15/2020   Rotator cuff tendinitis, right 12/15/2020   Dyspnea and respiratory abnormalities 02/18/2020   Diverticulosis 11/19/2019   Lung cancer ([place]) 12/03/2018   Hypertension 04/26/2017   Varicose veins of leg with pain, 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   Leg swelling 04/23/2016   Lymphedema 04/23/2016   Venous stasis  12/23/2015   Thyroid nodule 06/25/2015   Anxiety 12/31/2013   Aphasia 12/31/2013   Obesity 12/31/2013   Derangement of posterior horn of medial meniscus 09/13/2013   Neck pain 09/13/2013   Pure hypercholesterolemia 09/13/2013   Disorder of bursae and tendons in shoulder region 09/13/2013     Physical Exam  Triage Vital Signs: ED Triage Vitals  Enc Vitals Group     BP 01/12/22 2051 (!) 150/76     Pulse Rate 01/12/22 2051 98     Resp 01/12/22 2051 18     Temp 01/12/22 2051 98.3 F (36.8 C)     Temp Source 01/12/22 2051 Oral     SpO2 01/12/22 2051 100 %     Weight 01/12/22 2048 171 lb (77.6 kg)     Height 01/12/22 2048 4\' 9"  (1.448 m)     Head Circumference --      Peak Flow --      Pain Score 01/12/22 2048 3     Pain Loc --      Pain Edu? --      Excl. in [place]? --     Most recent vital signs: Vitals:   01/13/22 0021 01/13/22 0400  BP: 131/67 123/62  Pulse: 90 81  Resp: 16 16  Temp: 97.8 F (36.6 C)   SpO2: 96% 96%     General: Awake, no distress.  CV:  Good peripheral perfusion.  Resp:  Normal effort.  Abd:  No distention.  Mild tenderness throughout especially in the left lower quadrant no guarding Neuro:             Awake, Alert, Oriented x 3  Other:  Dark red blood on rectal exam   ED Results / Procedures / Treatments  Labs (all labs ordered are listed, but only abnormal results are displayed) Labs Reviewed  COMPREHENSIVE METABOLIC PANEL - Abnormal; Notable for the following components:      Result Value   Sodium 132 (*)    Glucose, Bld 128 (*)    BUN 25 (*)    Calcium 8.6 (*)    Total Protein 6.1 (*)    All other components within normal limits  CBC  HEMOGLOBIN AND HEMATOCRIT, BLOOD  POC OCCULT BLOOD, ED  TYPE AND SCREEN     EKG     RADIOLOGY CT abdomen and pelvis reviewed and interpreted by myself shows no acute process   PROCEDURES:  Critical Care performed: No  Procedures  The patient is on the cardiac monitor to evaluate for  evidence of arrhythmia and/or significant heart rate changes.   MEDICATIONS ORDERED IN ED: Medications  iohexol (OMNIPAQUE) 300 MG/ML solution 100 mL (100 mLs Intravenous Contrast Given 01/13/22 0449)     IMPRESSION / MDM / ASSESSMENT AND PLAN / ED COURSE  I reviewed the triage vital signs and the nursing notes.                              Patient's presentation is most consistent with acute presentation with potential threat to life or bodily function.  Differential diagnosis includes, but is not limited to, lower GI bleed from diverticular hemorrhage, internal hemorrhoids, rectal polyp, angiodysplasia, AVM  Patient is a 81 year old female presenting with hematochezia.  This just started today she had 2 episodes at home filling the toilet.  She endorses some generalized abdominal pain otherwise she is not having any symptoms of orthostasis, chest pain or shortness of breath.  Her vital signs are stable her hemoglobin is 12.8 no leukocytosis the rest of her labs are reassuring.  Type and screen has been sent.  On exam overall she appears well nontoxic abdomen is mildly tender throughout but is overall benign.  She has red blood on rectal exam.  Given going bleeding patient will require admission.  With her abdominal pain will obtain CT abdomen pelvis with contrast do not feel she needs a CTA at this time because is not having brisk bleeding or any hemodynamic instability.  CT abdomen pelvis shows signs of diverticulosis but no obvious diverticulitis.  No other acute findings.  Repeat hemoglobin is stable.  I have discussed with the hospitalist for admission.      FINAL CLINICAL IMPRESSION(S) / ED DIAGNOSES   Final diagnoses:  Lower GI bleeding     Rx / DC Orders   ED Discharge Orders     None        Note:  This document was prepared using Dragon voice recognition software and may include unintentional dictation errors.   Rada Hay, MD 01/13/22 [phone]

## 2022-01-13 NOTE — Assessment & Plan Note (Signed)
Small pulmonary nodules measuring 4 mm or less in size of the lung bases.  Will need follow-up with her oncology team as outpatient.

## 2022-01-13 NOTE — Assessment & Plan Note (Addendum)
Continue Protonix °

## 2022-01-13 NOTE — Progress Notes (Signed)
   GI Inpatient Short-Progress Note   CTA negative for any active bleed. I reviewed results with patient and husband over the phone this afternoon. We will plan on colonoscopy tomorrow for further evaluation. Clear liquid diet today with NPO after midnight. Bowel prep orders in.   Further recommendations after colonoscopy   I reviewed the risks (including bleeding, perforation, infection, anesthesia complications, cardiac/respiratory complications), benefits and alternatives of colonoscopy . Patient consents to proceed.    Octavia Bruckner, PA-C [facility] [phone]

## 2022-01-13 NOTE — Assessment & Plan Note (Signed)
Hold Lasix for right now

## 2022-01-13 NOTE — H&P (Addendum)
History and Physical    Lindsey Thomas:[id number] DOB: 29-May-1941 DOA: 01/13/2022  Referring MD/NP/PA:   PCP: Idelle Crouch, MD   Patient coming from:  The patient is coming from home.      Chief Complaint: rectal bleeding  HPI: Lindsey Thomas is a 81 y.o. female with medical history significant of HTH, HLD, asthma, GERD, hypothyroidism, obesity obesity BMI 37.00, NSCLC (s/p of radiation therapy, no surgery or chemo), DVT not on AC, lymphedema, who presents with rectal bleeding.  Patient states that she has had several episodes of large amount of rectal bleeding with bright red blood since last night at about 8:00. She has some central abdominal discomfort, but no active abdominal pain.  Patient has mild nausea, no vomiting or diarrhea.  No fever or chills.  Denies lightheadedness or dizziness.  Patient does not have chest pain, cough, shortness of breath.  No symptoms of UTI.  Data reviewed independently and ED Course: pt was found to have hemoglobin 12.3 --> 11.6 (13.4 on 12/01/2021), GFR> 60, temperature normal, blood pressure 116/72, heart rate 98, RR 18, oxygen saturation 96% on room air.  CT scan showed extensive diverticulosis otherwise not impressive. CAT negative for active bleeding. Patient is admitted to [place] bed as inpatient.  Dr. Alice Reichert of GI is consulted.  Ct-abdomen/pelvis 1. 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 2. No other acute findings are noted elsewhere in the abdomen or pelvis to account for the patient's symptoms. 3. Aortic atherosclerosis. 4. Small pulmonary nodules measuring 4 mm or less in size in the visualize lung bases. This is nonspecific, but statistically likely benign. No follow-up needed if patient is low-risk (and has no known or suspected primary neoplasm). Non-contrast chest CT can be considered in 12 months if patient is high-risk. This  recommendation follows the consensus statement: Guidelines for Management of Incidental Pulmonary Nodules Detected on CT Images: From the Fleischner Society 2017; Radiology 2017; 284:228-243.  CTA  The CT angiogram is negative for acute gastrointestinal hemorrhage.   Advanced left-sided diverticular disease again noted without evidence of acute diverticulitis.   Gaseous distension of the colon, without evidence of obstruction.   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   Aortic Atherosclerosis (ICD10-I70.0).   EKG: I have personally reviewed.  Not done in ED, will get one.     Review of Systems:   General: no fevers, chills, no body weight gain, fatigue HEENT: no blurry vision, hearing changes or sore throat Respiratory: no dyspnea, coughing, wheezing CV: no chest pain, no palpitations GI: has nausea, no vomiting, diarrhea, constipation. Has rectal bleeding GU: no dysuria, burning on urination, increased urinary frequency, hematuria  Ext: has leg edema Neuro: no unilateral weakness, numbness, or tingling, no vision change or hearing loss Skin: no rash, no skin tear. MSK: No muscle spasm, no deformity, no limitation of range of movement in spin Heme: No easy bruising.  Travel history: No recent long distant travel.   Allergy:  Allergies  Allergen Reactions   Eryc [Erythromycin]    Levofloxacin Nausea Only   Percocet [Oxycodone-Acetaminophen] Nausea And Vomiting   Augmentin [Amoxicillin-Pot Clavulanate] Rash   Celecoxib Palpitations    Past Medical History:  Diagnosis Date   Anxiety    Arthritis    Asthma    DVT (deep venous thrombosis) (HCC)    Edema    feet  Emphysema of lung (HCC)    GERD (gastroesophageal reflux disease)    Headache    MIGRAINES   History of orthopnea    Hypercholesterolemia    Hypothyroidism    NODULES   Lymphedema    Migraine    Non-small cell lung cancer, right ([place])  05/2019   Rad tx's   Shortness of breath dyspnea    WITH EXERTION   Sleep apnea    MILD, DOES NOT USE CPAP   Thyroid nodule     Past Surgical History:  Procedure Laterality Date   ABDOMINAL HYSTERECTOMY     BREAST BIOPSY     CARDIAC CATHETERIZATION     CATARACT EXTRACTION W/PHACO Left 02/12/2016   Procedure: CATARACT EXTRACTION PHACO AND INTRAOCULAR LENS PLACEMENT ([place]);  Surgeon: Eulogio Bear, MD;  Location: [facility] ORS;  Service: Ophthalmology;  Laterality: Left;  [place] 01:30 AP% 15.2 CDE 13.71 Fluid pack lot # [id number]   CATARACT EXTRACTION W/PHACO Right 03/18/2016   Procedure: CATARACT EXTRACTION PHACO AND INTRAOCULAR LENS PLACEMENT ([facility]);  Surgeon: Eulogio Bear, MD;  Location: [facility] ORS;  Service: Ophthalmology;  Laterality: Right;  Lot # [id number] H [place]: 01:05.5 AP%:14.3 CDE: 9.33   FRACTURE SURGERY Left    arm rod and screw   KNEE ARTHROSCOPY     OOPHORECTOMY     RCR     ROTATOR CUFF REPAIR Left 2006    Social History:  reports that she has never smoked. She has never used smokeless tobacco. She reports that she does not drink alcohol and does not use drugs.  Family History:  Family History  Problem Relation Age of Onset   Emphysema Mother    Heart Problems Mother    Tuberculosis Father    Brain cancer Father    Skin cancer Father    Breast cancer Sister    Irritable bowel syndrome Sister    Lung cancer Brother    Dementia Sister    Schizophrenia Sister      Prior to Admission medications   Medication Sig Start Date End Date Taking? Authorizing Provider  acetaminophen (TYLENOL) 500 MG tablet Take 1,000 mg by mouth daily as needed for pain.   Yes [provider]  aspirin EC 81 MG tablet Take 81 mg by mouth daily.   Yes [provider]  calcium carbonate (CALCIUM ANTACID) 500 MG chewable tablet Chew 2 tablets by mouth daily.   Yes [provider]  cetirizine (ZYRTEC) 10 MG tablet Take 10 mg by mouth at bedtime.    Yes [provider]  Cholecalciferol (VITAMIN D PO) Take 5,000 Units by mouth daily.   Yes [provider]  Cyanocobalamin (VITAMIN B-12 PO) Take 2 tablets by mouth daily.   Yes [provider]  fluticasone (FLONASE) 50 MCG/ACT nasal spray Place 2 sprays into both nostrils daily. 07/26/18  Yes [provider]  furosemide (LASIX) 20 MG tablet Take by mouth.  10/10/17 01/13/22 Yes [provider]  meloxicam (MOBIC) 7.5 MG tablet Take 1 tablet by mouth daily. 10/15/21  Yes [provider]  montelukast (SINGULAIR) 10 MG tablet Take 10 mg by mouth at bedtime.    Yes [provider]  Multiple Vitamins-Minerals (PRESERVISION AREDS PO) Take 1 capsule by mouth 2 (two) times daily.   Yes [provider]  omeprazole (PRILOSEC) 20 MG capsule Take 20 mg by mouth 2 (two) times daily before a meal.   Yes [provider]  Polyethyl Glycol-Propyl Glycol (SYSTANE) 0.4-0.3 % SOLN  Apply 1 drop to eye 2 (two) times daily.   Yes [provider]  SYMBICORT 160-4.5 MCG/ACT inhaler Inhale 2 puffs into the lungs 2 (two) times daily. 06/19/21  Yes [provider]  VENTOLIN HFA 108 (90 Base) MCG/ACT inhaler Inhale 2 puffs into the lungs as needed. 02/16/19  Yes [provider]  butalbital-acetaminophen-caffeine (FIORICET) 50-325-40 MG tablet Take by mouth 2 (two) times daily as needed for headache. Patient not taking: Reported on 01/13/2022    [provider]  ibandronate (BONIVA) 150 MG tablet 150 mg. Patient not taking: Reported on 01/07/2022 04/17/20   [provider]    Physical Exam: Vitals:   01/13/22 0800 01/13/22 0923 01/13/22 1126 01/13/22 1536  BP:  (!) 127/48 131/65 121/61  Pulse:  79 84 83  Resp:  17 16 17   Temp: 97.8 F (36.6 C) 97.9 F (36.6 C) 97.6 F (36.4 C) 97.9 F (36.6 C)  TempSrc: Oral Oral Oral   SpO2:  98% 99% 97%  Weight:      Height:       General: Not in acute distress HEENT:        Eyes: PERRL, EOMI, no scleral icterus.       ENT: No discharge from the ears and nose, no pharynx injection, no tonsillar enlargement.        Neck: No JVD, no bruit, no mass felt. Heme: No neck lymph node enlargement. Cardiac: S1/S2, RRR, No murmurs, No gallops or rubs. Respiratory: No rales, wheezing, rhonchi or rubs. GI: Soft, nondistended, nontender, no rebound pain, no organomegaly, BS present. GU: No hematuria Ext: 1+DP/PT pulse bilaterally.  Has lymphedematous change in both legs Musculoskeletal: No joint deformities, No joint redness or warmth, no limitation of ROM in spin. Skin: No rashes.  Neuro: Alert, oriented X3, cranial nerves II-XII grossly intact, moves all extremities. Psych: Patient is not psychotic, no suicidal or hemocidal ideation.  Labs on Admission: I have personally reviewed following labs and imaging studies  CBC: Recent Labs  Lab 01/12/22 2051 01/13/22 0437 01/13/22 1138  WBC 8.4  --  10.0  HGB 12.8 12.3 11.6*  HCT 38.7 37.3 35.0*  MCV 89.4  --  89.3  PLT 251  --  374   Basic Metabolic Panel: Recent Labs  Lab 01/12/22 2051  NA 132*  K 4.2  CL 99  CO2 25  GLUCOSE 128*  BUN 25*  CREATININE 0.87  CALCIUM 8.6*   GFR: Estimated Creatinine Clearance: 41.9 mL/min (by C-G formula based on SCr of 0.87 mg/dL). Liver Function Tests: Recent Labs  Lab 01/12/22 2051  AST 18  ALT 15  ALKPHOS 102  BILITOT 0.6  PROT 6.1*  ALBUMIN 3.9   No results for input(s): "LIPASE", "AMYLASE" in the last 168 hours. No results for input(s): "AMMONIA" in the last 168 hours. Coagulation Profile: Recent Labs  Lab 01/13/22 1138  INR 1.0   Cardiac Enzymes: No results for input(s): "CKTOTAL", "CKMB", "CKMBINDEX", "TROPONINI" in the last 168 hours. BNP (last 3 results) No results for input(s): "PROBNP" in the last 8760 hours. HbA1C: No results for input(s): "HGBA1C" in the last 72 hours. CBG: No results for input(s): "GLUCAP" in the last 168 hours. Lipid  Profile: No results for input(s): "CHOL", "HDL", "LDLCALC", "TRIG", "CHOLHDL", "LDLDIRECT" in the last 72 hours. Thyroid Function Tests: No results for input(s): "TSH", "T4TOTAL", "FREET4", "T3FREE", "THYROIDAB" in the last 72 hours. Anemia Panel: No results for input(s): "VITAMINB12", "FOLATE", "FERRITIN", "TIBC", "IRON", "RETICCTPCT" in the last  72 hours. Urine analysis:    Component Value Date/Time   COLORURINE Yellow 07/22/2011 0105   APPEARANCEUR Clear 07/22/2011 0105   LABSPEC 1.016 07/22/2011 0105   PHURINE 5.0 07/22/2011 0105   GLUCOSEU Negative 07/22/2011 0105   HGBUR Negative 07/22/2011 0105   BILIRUBINUR Negative 07/22/2011 0105   KETONESUR Negative 07/22/2011 0105   PROTEINUR Negative 07/22/2011 0105   NITRITE Negative 07/22/2011 0105   LEUKOCYTESUR 3+ 07/22/2011 0105   Sepsis Labs: @LABRCNTIP (procalcitonin:4,lacticidven:4) )No results found for this or any previous visit (from the past 240 hour(s)).   Radiological Exams on Admission: CT Angio Abd/Pel w/ and/or w/o  Result Date: 01/13/2022 CLINICAL DATA:  81 year old female with a history of lower GI bleeding EXAM: CTA ABDOMEN AND PELVIS WITHOUT AND WITH CONTRAST TECHNIQUE: Multidetector CT imaging of the abdomen and pelvis was performed using the standard protocol during bolus administration of intravenous contrast. Multiplanar reconstructed images and MIPs were obtained and reviewed to evaluate the vascular anatomy. RADIATION DOSE REDUCTION: This exam was performed according to the departmental dose-optimization program which includes automated exposure control, adjustment of the mA and/or kV according to patient size and/or use of iterative reconstruction technique. CONTRAST:  143mL OMNIPAQUE IOHEXOL 350 MG/ML SOLN COMPARISON:  01/13/2022, 06/23/2017 FINDINGS: VASCULAR Aorta: Mild atherosclerotic changes of the lower thoracic and the abdominal aorta. No pedunculated plaque, ulcerated plaque, dissection, periaortic fluid or  inflammatory changes. No aneurysm. Celiac: Celiac artery patent without significant atherosclerotic changes. Typical branching of the celiac artery into the left gastric artery, common hepatic artery, splenic artery. Rim calcified small aneurysm of the splenic artery at the hilum of the spleen, 7 mm. This appears unchanged dating to the CT 06/23/2017 SMA: SMA patent with minimal atherosclerosis at the origin. Renals: - Right: Single right renal artery, patent. No significant atherosclerotic changes. - Left: Single left renal artery. Mild atherosclerosis at the origin without evidence of high-grade stenosis. IMA: Inferior mesenteric artery is patent. Right lower extremity: Unremarkable course, caliber, and contour of the right iliac system. No aneurysm, dissection, or occlusion. Hypogastric artery is patent. Common femoral artery patent. Proximal SFA and profunda femoris patent. Left lower extremity: Unremarkable course, caliber, and contour of the left iliac system. No aneurysm, dissection, or occlusion. Hypogastric artery is patent. Common femoral artery patent. Proximal SFA and profunda femoris patent. Veins: Unremarkable appearance of the venous system. Review of the MIP images confirms the above findings. NON-VASCULAR Lower chest: No acute finding of the lower chest. Small pulmonary nodules as identified on earlier CT. Hepatobiliary: Unremarkable appearance of the liver. Unremarkable gall bladder. Pancreas: Unremarkable. Spleen: Unremarkable. Adrenals/Urinary Tract: - Right adrenal gland: Unremarkable - Left adrenal gland: Unremarkable. - Right kidney: No hydronephrosis, nephrolithiasis, inflammation, or ureteral dilation. No focal lesion. - Left Kidney: No hydronephrosis, nephrolithiasis, inflammation, or ureteral dilation. No focal lesion. - Urinary Bladder: Urinary bladder with excreted contrast. Stomach/Bowel: - Stomach: Small hiatal hernia. Otherwise unremarkable stomach with no evidence of puddling of  contrast. - Small bowel: Small bowel unremarkable. No abnormal distension. No transition point. No wall thickening. No accumulation of contrast within the lumen. - Appendix: Appendix is not visualized, however, no inflammatory changes are present adjacent to the cecum to indicate an appendicitis. - Colon: Minimal stool burden. Gaseous distension through the length of the colon without a transition point or focal obstruction. Relative decompression in the sigmoid colon. Redemonstration of advanced sigmoid diverticular disease. No acute inflammation. No accumulation of contrast within the lumen of the colon. Lymphatic: No adenopathy. Mesenteric: No free fluid  or air. No mesenteric adenopathy. Reproductive: Hysterectomy Other: Fat containing umbilical hernia. Musculoskeletal: No displaced fracture. Multilevel degenerative changes of the spine. Mild degenerative changes of the hips. IMPRESSION: The CT angiogram is negative for acute gastrointestinal hemorrhage. Advanced left-sided diverticular disease again noted without evidence of acute diverticulitis. Gaseous distension of the colon, without evidence of obstruction. 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 Aortic Atherosclerosis (ICD10-I70.0). Additional ancillary findings as above. Signed, Dulcy Fanny. Nadene Rubins, RPVI [facility] Radiology Electronically Signed   By: Corrie Mckusick D.O.   On: 01/13/2022 16:58   CT ABDOMEN PELVIS W CONTRAST  Result Date: 01/13/2022 CLINICAL DATA:  81 year old female with history of left lower quadrant abdominal pain. Bright red blood in stool. EXAM: CT ABDOMEN AND PELVIS WITH CONTRAST TECHNIQUE: Multidetector CT imaging of the abdomen and pelvis was performed using the standard protocol following bolus administration of intravenous contrast. RADIATION DOSE REDUCTION: This exam was performed according to the departmental  dose-optimization program which includes automated exposure control, adjustment of the mA and/or kV according to patient size and/or use of iterative reconstruction technique. CONTRAST:  160mL OMNIPAQUE IOHEXOL 300 MG/ML  SOLN COMPARISON:  CT of the abdomen and pelvis 06/23/2017. FINDINGS: Lower chest: Multiple small pulmonary nodules measuring 4 mm or less in size are noted in the visualized lung bases. Atherosclerotic calcifications in the descending thoracic aorta. Hepatobiliary: No suspicious cystic or solid hepatic lesions. No intra or extrahepatic biliary ductal dilatation. Gallbladder is normal in appearance. Pancreas: No pancreatic mass. No pancreatic ductal dilatation. No pancreatic or peripancreatic fluid collections or inflammatory changes. Spleen: Unremarkable. Adrenals/Urinary Tract: Bilateral kidneys and adrenal glands are normal in appearance. No hydroureteronephrosis. Urinary bladder is normal in appearance. Stomach/Bowel: The appearance of the stomach is normal. There is no pathologic dilatation of small bowel or colon. Numerous colonic diverticula are noted, particularly in the region of the sigmoid colon, without surrounding inflammatory changes to suggest an acute diverticulitis at this time. The appendix is not confidently identified and may be surgically absent. Regardless, there are no inflammatory changes noted adjacent to the cecum to suggest the presence of an acute appendicitis at this time. Vascular/Lymphatic: Atherosclerosis in the abdominal aorta and pelvic vasculature, without evidence of aneurysm or dissection. No lymphadenopathy noted in the abdomen or pelvis. Reproductive: Status post hysterectomy. Ovaries are not confidently identified may be surgically absent or atrophic. Other: No significant volume of ascites.  No pneumoperitoneum. Musculoskeletal: There are no aggressive appearing lytic or blastic lesions noted in the visualized portions of the skeleton. IMPRESSION: 1. There is  extensive colonic diverticulosis, but no definite inflammatory changes to indicate an acute diverticulitis at this time. 2. No other acute findings are noted elsewhere in the abdomen or pelvis to account for the patient's symptoms. 3. Aortic atherosclerosis. 4. Small pulmonary nodules measuring 4 mm or less in size in the visualize lung bases. This is nonspecific, but statistically likely benign. No follow-up needed if patient is low-risk (and has no known or suspected primary neoplasm). Non-contrast chest CT can be considered in 12 months if patient is high-risk. This recommendation follows the consensus statement: Guidelines for Management of Incidental Pulmonary Nodules Detected on CT Images: From the Fleischner Society 2017; Radiology 2017; 284:228-243. Electronically Signed   By: Vinnie Langton M.D.   On: 01/13/2022 05:14      Assessment/Plan Principal Problem:   Hematochezia Active Problems:   Diverticulosis   Lymphedema  Hypertension   GERD (gastroesophageal reflux disease)   Hypothyroidism   Asthma   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.0 to 39.9   Lung nodule   Lung cancer (HCC)   Assessment and Plan: * Hematochezia Hematochezia possibly due to diverticulosis: Hemoglobin 13.4 --> 12.3 --> 11.6. CTA negative for active bleeding. Hemodynamically stable.  Consulted Dr. Alice Reichert of GI --> colonoscopy in the morning  - will admitted to med-surg bed as inpatient - IVF: 75 mL/hr --> d/'ced IVF after started clear diet - protonix 40 mg bid orally - Zofran IV for nausea - Avoid NSAIDs and SQ heparin - Maintain IV access (2 large bore IVs if possible). - Monitor closely and follow q6h cbc, transfuse as necessary, if Hgb<7.0 - LaB: INR, PTT and type screen - Hold ASA and mobic   Diverticulosis -see above  Lymphedema -hold lasix while pt is on NPO and IVF  Hypertension Bp 116/72 -Hold lasix -IV hydralazine as needed  GERD (gastroesophageal reflux disease) - on   Protonix  Hypothyroidism Pt is not taking medications currently.  TSH 4.396 on 08/06/21 -Follow-up with PCP  Asthma Stable -Bronchodilators  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.0 to 39.9  BMI= 37   and BW= 77.6 -Diet and exercise.   -Encourage to lose weight.   Lung nodule Lung nodule and hx of lung cancer: pt had hx of NSCLC. She is s/p of XRT. CT of abdomen/pelvis showed 4 mm lung nodule. -Need to follow-up with her oncologist, Dr. Donella Stade -Patient is aware of this small nodule.  She agreed to follow-up with her oncologist.  Lung cancer [facility]) -see above          DVT ppx: SCd  Code Status: Full code per patient  Family Communication:   Yes, patient's daughter by phone  Disposition Plan:  Anticipate discharge back to previous environment  Consults called:  Dr. Alice Reichert of GI  Admission status and Level of care: Med-Surg:   as inpt      Severity of Illness:  The appropriate patient status for this patient is INPATIENT. Inpatient status is judged to be reasonable and necessary in order to provide the required intensity of service to ensure the patient's safety. The patient's presenting symptoms, physical exam findings, and initial radiographic and laboratory data in the context of their chronic comorbidities is felt to place them at high risk for further clinical deterioration. Furthermore, it is not anticipated that the patient will be medically stable for discharge from the hospital within 2 midnights of admission.   * I certify that at the point of admission it is my clinical judgment that the patient will require inpatient hospital care spanning beyond 2 midnights from the point of admission due to high intensity of service, high risk for further deterioration and high frequency of surveillance required.*       Date of Service 01/13/2022    Ivor Costa [facility]   If 7PM-7AM, please contact night-coverage [email or web address] 01/13/2022, 5:22 PM

## 2022-01-13 NOTE — Assessment & Plan Note (Signed)
Stable -Bronchodilators 

## 2022-01-13 NOTE — ED Notes (Signed)
ED Provider at bedside. This RN witnessed a rectal exam.

## 2022-01-13 NOTE — Consult Note (Signed)
GI Inpatient Consult Note  Reason for Consult: LGI bleed   Attending Requesting Consult: Dr. Ivor Costa  History of Present Illness: Lindsey Thomas is a 81 y.o. female seen for evaluation of painless hematochezia 2/2 acute LGI bleeding at the request of admitting hospitalist - Dr. Ivor Costa. Patient has a PMH of HTN, HLD, asthma, GERD, obesity, hypothyroidism, NSCLC s/p radiation therapy, Hx of DVT NOT on chronic anticoagulation, and lymphedema. She presented to the [facility] ED yesterday evening from home for complaints of 2 episodes of painless hematochezia. She reports first episode started around 8 PM last night about an hour after dinner and was bright red to dark red in color. She had another episode within the next hour and was immediately brought to the ED for further evaluation. She had to wait in the ED for 7 hours before being seen. She had additional episode of hematochezia in the ED waiting room and then an additional episode around 4 AM this morning. Upon presentation to the ED, she was hypertensive but otherwise stable vital signs. Blood work was significant for hemoglobin 12.3 (was 13.4 six weeks ago), sodium 132, otherwise normal serum electrolytes and normal liver enzymes. CT abd/pelvis with contrast showed extensive colonic diverticulosis without evidence for acute diverticulitis and no other acute findings. She was admitted to [facility] as inpatient and GI consult was placed.   Patient seen and examined around 12 PM today resting comfortably in hospital bed. Her husband and daughter are present bedside. She reports she had another large volume episode of hematochezia around 11 AM this morning. In total she has had 5 episodes since 8 PM last night. There is no melanotic stool. Per nursing staff, there was evidence of bright red and maroon colored stool with clots with a foul smell. Patient reports when symptoms first started she wasn't having abdominal pain, but this morning she has some  abdominal discomfort in epigastrium and LLQ. She denies any fevers, chills, or sweats. She has had some nausea and two episodes of NBNB emesis this morning. She has had some dry heaving. Repeat hemoglobin 11.6 around 11:30 AM. She denies any history of GI bleeding. She does take Mobic 7.5 mg daily for pain and ASA 81 mg daily. Last colonoscopy 05/2013 performed by Dr. Vira Agar showed sigmoid diverticulosis and otherwise normal examined colon.    Past Medical History:  Past Medical History:  Diagnosis Date   Anxiety    Arthritis    Asthma    DVT (deep venous thrombosis) (HCC)    Edema    feet   Emphysema of lung (HCC)    GERD (gastroesophageal reflux disease)    Headache    MIGRAINES   History of orthopnea    Hypercholesterolemia    Hypothyroidism    NODULES   Lymphedema    Migraine    Non-small cell lung cancer, right ([place]) 05/2019   Rad tx's   Shortness of breath dyspnea    WITH EXERTION   Sleep apnea    MILD, DOES NOT USE CPAP   Thyroid nodule     Problem List: Patient Active Problem List   Diagnosis Date Noted   Hematochezia 01/13/2022   GERD (gastroesophageal reflux disease)    Hypothyroidism    Asthma    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.0 to 39.9    Lung nodule    Lower limb ulcer, calf, left, limited to breakdown of skin ([place]) 05/08/2021   Acute deep vein thrombosis (DVT)  of proximal vein of left lower extremity ([place]) 01/01/2021   Tendinitis of upper biceps tendon of right shoulder 12/15/2020   Rotator cuff tendinitis, right 12/15/2020   Dyspnea and respiratory abnormalities 02/18/2020   Diverticulosis 11/19/2019   Lung cancer ([place]) 12/03/2018   Hypertension 04/26/2017   Varicose veins of leg with pain, 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   Leg swelling 04/23/2016   Lymphedema 04/23/2016   Venous stasis 12/23/2015   Thyroid nodule 06/25/2015   Anxiety 12/31/2013   Aphasia 12/31/2013   Obesity 12/31/2013   Derangement of posterior horn of medial meniscus 09/13/2013    Neck pain 09/13/2013   Pure hypercholesterolemia 09/13/2013   Disorder of bursae and tendons in shoulder region 09/13/2013    Past Surgical History: Past Surgical History:  Procedure Laterality Date   ABDOMINAL HYSTERECTOMY     BREAST BIOPSY     CARDIAC CATHETERIZATION     CATARACT EXTRACTION W/PHACO Left 02/12/2016   Procedure: CATARACT EXTRACTION PHACO AND INTRAOCULAR LENS PLACEMENT ([place]);  Surgeon: Eulogio Bear, MD;  Location: [facility] ORS;  Service: Ophthalmology;  Laterality: Left;  [place] 01:30 AP% 15.2 CDE 13.71 Fluid pack lot # [id number]   CATARACT EXTRACTION W/PHACO Right 03/18/2016   Procedure: CATARACT EXTRACTION PHACO AND INTRAOCULAR LENS PLACEMENT ([facility]);  Surgeon: Eulogio Bear, MD;  Location: [facility] ORS;  Service: Ophthalmology;  Laterality: Right;  Lot # [id number] H [place]: 01:05.5 AP%:14.3 CDE: 9.33   FRACTURE SURGERY Left    arm rod and screw   KNEE ARTHROSCOPY     OOPHORECTOMY     RCR     ROTATOR CUFF REPAIR Left 2006    Allergies: Allergies  Allergen Reactions   Eryc [Erythromycin]    Levofloxacin Nausea Only   Percocet [Oxycodone-Acetaminophen] Nausea And Vomiting   Augmentin [Amoxicillin-Pot Clavulanate] Rash   Celecoxib Palpitations    Home Medications: Medications Prior to Admission  Medication Sig Dispense Refill Last Dose   acetaminophen (TYLENOL) 500 MG tablet Take 1,000 mg by mouth daily as needed for pain.   prn at prn   aspirin EC 81 MG tablet Take 81 mg by mouth daily.   01/12/2022 at 0800   calcium carbonate (CALCIUM ANTACID) 500 MG chewable tablet Chew 2 tablets by mouth daily.   01/12/2022 at 0800   cetirizine (ZYRTEC) 10 MG tablet Take 10 mg by mouth at bedtime.    01/11/2022 at 1800   Cholecalciferol (VITAMIN D PO) Take 5,000 Units by mouth daily.   01/11/2022 at 1800   Cyanocobalamin (VITAMIN B-12 PO) Take 2 tablets by mouth daily.   01/11/2022 at 1800   fluticasone (FLONASE) 50 MCG/ACT nasal spray Place 2 sprays into both nostrils daily.   01/11/2022 at  1800   furosemide (LASIX) 20 MG tablet Take by mouth.    01/12/2022 at 0800   meloxicam (MOBIC) 7.5 MG tablet Take 1 tablet by mouth daily.   01/12/2022 at 0800   montelukast (SINGULAIR) 10 MG tablet Take 10 mg by mouth at bedtime.    01/11/2022 at 1800   Multiple Vitamins-Minerals (PRESERVISION AREDS PO) Take 1 capsule by mouth 2 (two) times daily.   01/12/2022 at 1800   omeprazole (PRILOSEC) 20 MG capsule Take 20 mg by mouth 2 (two) times daily before a meal.   01/12/2022 at 0800   Polyethyl Glycol-Propyl Glycol (SYSTANE) 0.4-0.3 % SOLN Apply 1 drop to eye 2 (two) times daily.   01/12/2022 at 0800   SYMBICORT 160-4.5 MCG/ACT inhaler Inhale 2 puffs into the lungs 2 (two) times  daily.   01/12/2022 at 0800   VENTOLIN HFA 108 (90 Base) MCG/ACT inhaler Inhale 2 puffs into the lungs as needed.   prn at prn   butalbital-acetaminophen-caffeine (FIORICET) 50-325-40 MG tablet Take by mouth 2 (two) times daily as needed for headache. (Patient not taking: Reported on 01/13/2022)   Not Taking   ibandronate (BONIVA) 150 MG tablet 150 mg. (Patient not taking: Reported on 01/07/2022)   Not Taking   Home medication reconciliation was completed with the patient.   Scheduled Inpatient Medications:    calcium carbonate  2 tablet Oral Daily   cholecalciferol  5,000 Units Oral Daily   vitamin B-12  1,000 mcg Oral Daily   loratadine  10 mg Oral Daily   [START ON 01/14/2022] mometasone-formoterol  2 puff Inhalation BID   montelukast  10 mg Oral QHS   multivitamin-lutein  1 capsule Oral BID   pantoprazole  40 mg Oral BID   [START ON 01/14/2022] polyvinyl alcohol  1 drop Both Eyes BID    Continuous Inpatient Infusions:    sodium chloride 75 mL/hr at 01/13/22 0932    PRN Inpatient Medications:  acetaminophen, albuterol, dextromethorphan-guaiFENesin, fluticasone, hydrALAZINE, ondansetron (ZOFRAN) IV  Family History: family history includes Brain cancer in her father; Breast cancer in her sister; Dementia in her sister;  Emphysema in her mother; Heart Problems in her mother; Irritable bowel syndrome in her sister; Lung cancer in her brother; Schizophrenia in her sister; Skin cancer in her father; Tuberculosis in her father.  The patient's family history is negative for inflammatory bowel disorders, GI malignancy, or solid organ transplantation.  Social History:   reports that she has never smoked. She has never used smokeless tobacco. She reports that she does not drink alcohol and does not use drugs. The patient denies ETOH, tobacco, or drug use.   Review of Systems: Constitutional: Weight is stable.  Eyes: No changes in vision. ENT: No oral lesions, sore throat.  GI: see HPI.  Heme/Lymph: No easy bruising.  CV: No chest pain.  GU: No hematuria.  Integumentary: No rashes.  Neuro: No headaches.  Psych: No depression/anxiety.  Endocrine: No heat/cold intolerance.  Allergic/Immunologic: No urticaria.  Resp: No cough, SOB.  Musculoskeletal: No joint swelling.    Physical Examination: BP 131/65 (BP Location: Right Arm)   Pulse 84   Temp 97.6 F (36.4 C) (Oral)   Resp 16   Ht 4\' 9"  (1.448 m)   Wt 77.6 kg   SpO2 99%   BMI 37.00 kg/m  Gen: NAD, alert and oriented x 4 HEENT: PEERLA, EOMI, Neck: supple, no JVD or thyromegaly Chest: CTA bilaterally, no wheezes, crackles, or other adventitious sounds CV: RRR, no m/g/c/r Abd: soft, mild distention, +BS in all four quadrants; tender to deep palpation in LLQ and suprapubic regions, no fluid wave, no HSM, guarding, ridigity, or rebound tenderness Ext: no edema, well perfused with 2+ pulses, Skin: no rash or lesions noted Lymph: no LAD  Data: Lab Results  Component Value Date   WBC 10.0 01/13/2022   HGB 11.6 (L) 01/13/2022   HCT 35.0 (L) 01/13/2022   MCV 89.3 01/13/2022   PLT 166 01/13/2022   Recent Labs  Lab 01/12/22 2051 01/13/22 0437 01/13/22 1138  HGB 12.8 12.3 11.6*   Lab Results  Component Value Date   NA 132 (L) 01/12/2022   K 4.2  01/12/2022   CL 99 01/12/2022   CO2 25 01/12/2022   BUN 25 (H) 01/12/2022   CREATININE 0.87 01/12/2022  Lab Results  Component Value Date   ALT 15 01/12/2022   AST 18 01/12/2022   ALKPHOS 102 01/12/2022   BILITOT 0.6 01/12/2022   Recent Labs  Lab 01/13/22 1138  INR 1.0    Assessment/Plan:  81 y/o Caucasian female with a PMH of HTN, HLD, asthma, GERD, obesity, hypothyroidism, NSCLC s/p radiation therapy, Hx of DVT NOT on chronic anticoagulation, and lymphedema presented to the [facility] ED last night for chief complaint of painless hematochezia   Acute LGI bleed - clinical presentation is most c/w diverticular bleed, other considerations are AVMs, hemorrhoidal bleeding, colon polyps, neoplasm, ischemic colitis, IBD, etc  Colonic diverticulosis without evidence of diverticulitis   NSCLC s/p radiation therapy - follows with Dr. Rao/Dr. Baruch Gouty   Obesity  Recommendations:  - Concerned for acute LGI bleed since she continues to have hematochezia - Hemodynamically stable currently - Maintain 2 large bore IVs for access - Continue to monitor serial H&H. Transfuse for Hgb <7.0.  - I have ordered CTA abd/pelvis w/wo contrast to assess for acute GI bleeding this afternoon. If positive, will consult vascular surgery for consideration of embolization. If negative, will need colonoscopy tomorrow for luminal evaluation - NPO status  - Further recommendations after CTA - Patient and family are in agreement with above plan of care. I answered all of their questions to best of my ability.    Thank you for the consult. Please call with questions or concerns.  Geanie Kenning, PA-C [facility] [phone]

## 2022-01-13 NOTE — Assessment & Plan Note (Deleted)
Hematochezia possibly due to diverticulosis: Hemoglobin 13.4 --> 12.3 --> 11.6. CTA negative for active bleeding. Hemodynamically stable.  Consulted Dr. Alice Reichert of GI --> colonoscopy in the morning  - will admitted to med-surg bed as inpatient - IVF: 75 mL/hr --> d/'ced IVF after started clear diet - protonix 40 mg bid orally - Zofran IV for nausea - Avoid NSAIDs and SQ heparin - Maintain IV access (2 large bore IVs if possible). - Monitor closely and follow q6h cbc, transfuse as necessary, if Hgb<7.0 - LaB: INR, PTT and type screen - Hold ASA and mobic

## 2022-01-14 ENCOUNTER — Encounter
Admission: EM | Disposition: A | Discharge status: Home / Self Care | Payer: Self-pay | Source: Home / Self Care | Attending: Internal Medicine

## 2022-01-14 ENCOUNTER — Observation Stay: Payer: Medicare Other | Admitting: Anesthesiology

## 2022-01-14 DIAGNOSIS — R911 Solitary pulmonary nodule: Secondary | ICD-10-CM

## 2022-01-14 DIAGNOSIS — K219 Gastro-esophageal reflux disease without esophagitis: Secondary | ICD-10-CM | POA: Diagnosis not present

## 2022-01-14 DIAGNOSIS — R1084 Generalized abdominal pain: Secondary | ICD-10-CM

## 2022-01-14 DIAGNOSIS — R109 Unspecified abdominal pain: Secondary | ICD-10-CM

## 2022-01-14 DIAGNOSIS — I890 Lymphedema, not elsewhere classified: Secondary | ICD-10-CM | POA: Diagnosis not present

## 2022-01-14 DIAGNOSIS — I728 Aneurysm of other specified arteries: Secondary | ICD-10-CM

## 2022-01-14 DIAGNOSIS — E039 Hypothyroidism, unspecified: Secondary | ICD-10-CM | POA: Diagnosis not present

## 2022-01-14 DIAGNOSIS — K921 Melena: Secondary | ICD-10-CM | POA: Diagnosis not present

## 2022-01-14 DIAGNOSIS — K5731 Diverticulosis of large intestine without perforation or abscess with bleeding: Secondary | ICD-10-CM | POA: Diagnosis not present

## 2022-01-14 DIAGNOSIS — E669 Obesity, unspecified: Secondary | ICD-10-CM | POA: Diagnosis not present

## 2022-01-14 DIAGNOSIS — Z6837 Body mass index (BMI) 37.0-37.9, adult: Secondary | ICD-10-CM | POA: Diagnosis not present

## 2022-01-14 DIAGNOSIS — K5730 Diverticulosis of large intestine without perforation or abscess without bleeding: Secondary | ICD-10-CM | POA: Diagnosis not present

## 2022-01-14 DIAGNOSIS — J4520 Mild intermittent asthma, uncomplicated: Secondary | ICD-10-CM | POA: Diagnosis not present

## 2022-01-14 DIAGNOSIS — J45909 Unspecified asthma, uncomplicated: Secondary | ICD-10-CM | POA: Diagnosis not present

## 2022-01-14 DIAGNOSIS — R110 Nausea: Secondary | ICD-10-CM

## 2022-01-14 DIAGNOSIS — E871 Hypo-osmolality and hyponatremia: Secondary | ICD-10-CM

## 2022-01-14 DIAGNOSIS — J439 Emphysema, unspecified: Secondary | ICD-10-CM | POA: Diagnosis not present

## 2022-01-14 DIAGNOSIS — K625 Hemorrhage of anus and rectum: Secondary | ICD-10-CM | POA: Diagnosis not present

## 2022-01-14 DIAGNOSIS — I10 Essential (primary) hypertension: Secondary | ICD-10-CM | POA: Diagnosis not present

## 2022-01-14 DIAGNOSIS — T7840XA Allergy, unspecified, initial encounter: Secondary | ICD-10-CM | POA: Diagnosis not present

## 2022-01-14 DIAGNOSIS — Z6836 Body mass index (BMI) 36.0-36.9, adult: Secondary | ICD-10-CM | POA: Diagnosis not present

## 2022-01-14 DIAGNOSIS — G4730 Sleep apnea, unspecified: Secondary | ICD-10-CM | POA: Diagnosis not present

## 2022-01-14 HISTORY — PX: FLEXIBLE SIGMOIDOSCOPY: SHX5431

## 2022-01-14 LAB — CBC
HCT: 31 % — ABNORMAL LOW (ref 36.0–46.0)
HCT: 32.9 % — ABNORMAL LOW (ref 36.0–46.0)
Hemoglobin: 10.4 g/dL — ABNORMAL LOW (ref 12.0–15.0)
Hemoglobin: 11.1 g/dL — ABNORMAL LOW (ref 12.0–15.0)
MCH: 29.9 pg (ref 26.0–34.0)
MCH: 29.8 pg (ref 26.0–34.0)
MCHC: 33.5 g/dL (ref 30.0–36.0)
MCHC: 33.7 g/dL (ref 30.0–36.0)
MCV: 89.1 fL (ref 80.0–100.0)
MCV: 88.4 fL (ref 80.0–100.0)
Platelets: 210 10*3/uL (ref 150–400)
Platelets: 218 10*3/uL (ref 150–400)
RBC: 3.48 MIL/uL — ABNORMAL LOW (ref 3.87–5.11)
RBC: 3.72 MIL/uL — ABNORMAL LOW (ref 3.87–5.11)
RDW: 14.4 % (ref 11.5–15.5)
RDW: 14.4 % (ref 11.5–15.5)
WBC: 10.8 10*3/uL — ABNORMAL HIGH (ref 4.0–10.5)
WBC: 10.3 10*3/uL (ref 4.0–10.5)
nRBC: 0 % (ref 0.0–0.2)
nRBC: 0 % (ref 0.0–0.2)

## 2022-01-14 LAB — GLUCOSE, CAPILLARY: Glucose-Capillary: 119 mg/dL — ABNORMAL HIGH (ref 70–99)

## 2022-01-14 SURGERY — SIGMOIDOSCOPY, FLEXIBLE
Anesthesia: General

## 2022-01-14 MED ORDER — POLYETHYLENE GLYCOL 3350 17 G PO PACK
17.0000 g | PACK | Freq: Every day | ORAL | Status: DC
  Administered 2022-01-14: 17 g via ORAL
  Filled 2022-01-14: qty 1

## 2022-01-14 MED ORDER — LIDOCAINE 2% (20 MG/ML) 5 ML SYRINGE
INTRAMUSCULAR | Status: DC | PRN
  Administered 2022-01-14: 20 mg via INTRAVENOUS

## 2022-01-14 MED ORDER — SODIUM CHLORIDE 0.9 % IV SOLN: INTRAVENOUS | Status: DC

## 2022-01-14 MED ORDER — PROPOFOL 10 MG/ML IV BOLUS
INTRAVENOUS | Status: DC | PRN
  Administered 2022-01-14: 70 mg via INTRAVENOUS

## 2022-01-14 MED ORDER — PROPOFOL 500 MG/50ML IV EMUL
INTRAVENOUS | Status: DC | PRN
  Administered 2022-01-14: 100 ug/kg/min via INTRAVENOUS

## 2022-01-14 NOTE — Assessment & Plan Note (Signed)
Likely diverticular bleed.  Flexible sigmoidoscopy did not show any signs of bleeding.  Continue to hold aspirin and Mobic.  Last hemoglobin 11.1

## 2022-01-14 NOTE — Hospital Course (Signed)
81 year old female with history of non-small cell lung cancer, sleep apnea, hypothyroidism, hyperlipidemia, GERD, COPD.  She presents with rectal bleeding filling up the commode 2 times at home and bloody bowel movements here.  The patient was unable to take a colonoscopy prep secondary to nausea, abdominal pain and distention.  Dr. Alice Reichert did a flexible sigmoidoscopy and did not see any further bleeding.  Dr. Alice Reichert did state the patient can go home and follow-up as an outpatient but when we tried to advance her diet she did not eat very well and was nauseous and had abdominal pain.

## 2022-01-14 NOTE — Assessment & Plan Note (Signed)
Abdominal pain and distention.  Patient feels like she needs to have a bowel movement.  Will order MiraLAX since she did not tolerate the colonoscopy prep.

## 2022-01-14 NOTE — Assessment & Plan Note (Signed)
Unchanged from prior imaging.

## 2022-01-14 NOTE — Op Note (Addendum)
[facility]astroenter[facility] Patient Name: Lindsey Thomas Procedure Date: 01/14/2022 1:33 PM MRN: [id number] Account #: [id number] Date of Birth: 11-12-1940 Admit Type: Inpatient Age: 81 Room: [facility] ENDO ROOM 1 Gender: Female Note Status: Supervisor Override Instrument Name: Peds Colonoscope [id number] Procedure:             Flexible Sigmoidoscopy Indications:           Hematochezia Providers:             Benay Pike. Alice Reichert MD, MD Referring MD:          Leonie Douglas. Doy Hutching, MD (Referring MD) Medicines:             Propofol per Anesthesia Complications:         No immediate complications. Estimated blood loss: None. Procedure:             Pre-Anesthesia Assessment:                        - The risks and benefits of the procedure and the                         sedation options and risks were discussed with the                         patient. All questions were answered and informed                         consent was obtained.                        - Patient identification and proposed procedure were                         verified prior to the procedure by the nurse. The                         procedure was verified in the procedure room.                        - ASA Grade Assessment: III - A patient with severe                         systemic disease.                        - After reviewing the risks and benefits, the patient                         was deemed in satisfactory condition to undergo the                         procedure.                        After obtaining informed consent, the scope was passed                         under direct vision. The Colonoscope was introduced  through the anus and advanced to the the sigmoid                         colon. The flexible sigmoidoscopy was somewhat                         difficult due to inadequate bowel prep. The quality of                         the bowel preparation was  poor. Findings:      The perianal and digital rectal examinations were normal. Pertinent       negatives include normal sphincter tone and no palpable rectal lesions.      Many small-mouthed diverticula were found in the sigmoid colon.      Copious quantities of semi-liquid stool was found in the rectum and in       the sigmoid colon, interfering with visualization. Lavage of the area       was performed using a moderate amount of sterile water, resulting in       incomplete clearance with continued poor visualization.      There is no endoscopic evidence of bleeding, erythema, inflammation or       mass in the rectum and in the sigmoid colon. Impression:            - Preparation of the colon was poor.                        - Diverticulosis in the sigmoid colon. There was no                         evidence of diverticular bleeding. I assume the                         diverticulosis to be the source of bleeding that has                         now resolved.                        - Stool in the rectum and in the sigmoid colon.                        - No specimens collected. Recommendation:        - Discharge patient to home.                        - Clear liquid diet.                        - Advance diet as tolerated.                        - Continue present medications.                        - Return to GI office PRN.                        - The findings and recommendations were discussed with  the patient. Procedure Code(s):     --- Professional ---                        [phone], Sigmoidoscopy, flexible; diagnostic, including                         collection of specimen(s) by brushing or washing, when                         performed (separate procedure) Diagnosis Code(s):     --- Professional ---                        K57.30, Diverticulosis of large intestine without                         perforation or abscess without bleeding                         K92.1, Melena (includes Hematochezia) CPT copyright 2019 American Medical Association. All rights reserved. The codes documented in this report are preliminary and upon coder review may  be revised to meet current compliance requirements. Efrain Sella MD, MD 01/14/2022 1:53:57 PM This report has been signed electronically. Number of Addenda: 0 Note Initiated On: 01/14/2022 1:33 PM Total Procedure Duration: 0 hours 2 minutes 2 seconds  Estimated Blood Loss:  Estimated blood loss: none.      [facility]

## 2022-01-14 NOTE — Interval H&P Note (Signed)
History and Physical Interval Note:  01/14/2022 1:36 PM  Lindsey Thomas  has presented today for surgery, with the diagnosis of Hematochezia, LGI bleed.  The various methods of treatment have been discussed with the patient and family. After consideration of risks, benefits and other options for treatment, the patient has consented to  Procedure(s): FLEXIBLE SIGMOIDOSCOPY (N/A) as a surgical intervention.  The patient's history has been reviewed, patient examined, no change in status, stable for surgery.  I have reviewed the patient's chart and labs.  Questions were answered to the patient's satisfaction.     [place], [place]

## 2022-01-14 NOTE — Assessment & Plan Note (Signed)
Sodium only a couple points less than the normal range.

## 2022-01-14 NOTE — Interval H&P Note (Signed)
History and Physical Interval Note:  01/14/2022 1:37 PM  Lindsey Thomas  has presented today for surgery, with the diagnosis of Hematochezia, LGI bleed.  The various methods of treatment have been discussed with the patient and family. After consideration of risks, benefits and other options for treatment, the patient has consented to  Procedure(s): FLEXIBLE SIGMOIDOSCOPY (N/A) as a surgical intervention.  The patient's history has been reviewed, patient examined, no change in status, stable for surgery.  I have reviewed the patient's chart and labs.  Questions were answered to the patient's satisfaction.     [place], [place]

## 2022-01-14 NOTE — Progress Notes (Signed)
Progress Note   Patient: Lindsey Thomas JEH:[id number] DOB: Feb 09, 1941 DOA: 01/13/2022     1 DOS: the patient was seen and examined on 01/14/2022   Brief hospital course: 81 year old female with history of non-small cell lung cancer, sleep apnea, hypothyroidism, hyperlipidemia, GERD, COPD.  She presents with rectal bleeding filling up the commode 2 times at home and bloody bowel movements here.  The patient was unable to take a colonoscopy prep secondary to nausea, abdominal pain and distention.  Dr. Alice Reichert did a flexible sigmoidoscopy and did not see any further bleeding.  Dr. Alice Reichert did state the patient can go home and follow-up as an outpatient but when we tried to advance her diet she did not eat very well and was nauseous and had abdominal pain.  Assessment and Plan: * Rectal bleeding Likely diverticular bleed.  Flexible sigmoidoscopy did not show any signs of bleeding.  Continue to hold aspirin and Mobic.  Last hemoglobin 11.1  Abdominal pain Abdominal pain and distention.  Patient feels like she needs to have a bowel movement.  Will order MiraLAX since she did not tolerate the colonoscopy prep.  Nausea Patient did not eat well for dinner tonight.  Zofran prior.  Lymphedema Hold Lasix for right now.  Hypertension Hold Lasix for right now  GERD (gastroesophageal reflux disease) Continue Protonix  Asthma Stable -Bronchodilators  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.0 to 39.9 BMI 37 with height and weight in the computer  Lung nodule Small pulmonary nodules measuring 4 mm or less in size of the lung bases.  Will need follow-up with her oncology team as outpatient.  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  Hyponatremia Sodium only a couple points less than the normal range.        Subjective: Patient seen before flexible sigmoidoscopy and after trying to eat.  Patient not able to eat very much with dinner and  still felt nauseous.  Patient was unable to tolerate the entire colonoscopy prep last night.  Physical Exam: Vitals:   01/14/22 1402 01/14/22 1412 01/14/22 1437 01/14/22 1609  BP: (!) 134/55 131/71 (!) 128/57 137/67  Pulse: 93 (!) 104 97 98  Resp: 13 16 18 20   Temp:   98.3 F (36.8 C) 98.6 F (37 C)  TempSrc:      SpO2: 99% 99% 99% 97%  Weight:      Height:       Physical Exam HENT:     Head: Normocephalic.     Mouth/Throat:     Pharynx: No oropharyngeal exudate.  Eyes:     General: Lids are normal.     Conjunctiva/sclera: Conjunctivae normal.  Cardiovascular:     Rate and Rhythm: Normal rate and regular rhythm.     Heart sounds: Normal heart sounds, S1 normal and S2 normal.  Pulmonary:     Breath sounds: No decreased breath sounds, wheezing, rhonchi or rales.  Abdominal:     General: There is distension.     Tenderness: There is generalized abdominal tenderness.  Musculoskeletal:     Right lower leg: No swelling.     Left lower leg: No swelling.  Skin:    General: Skin is warm.     Findings: No rash.  Neurological:     Mental Status: She is alert and oriented to person, place, and time.     Data Reviewed: Platelets count 10.3, hemoglobin 11.1, platelet count 218, sodium 132, BUN 25, creatinine  0.87  Family Communication: Spoke with family at the bedside this morning and this evening.  Disposition: Status is: Observation Patient nauseous this evening and did not eat much.  Still having abdominal distention and pain. Planned Discharge Destination: Home    Time spent: 27 minutes  Author: Loletha Grayer, MD 01/14/2022 6:09 PM  For on call review [email or web address].

## 2022-01-14 NOTE — Assessment & Plan Note (Signed)
Patient did not eat well for dinner tonight.  Zofran prior.

## 2022-01-14 NOTE — Transfer of Care (Signed)
Immediate Anesthesia Transfer of Care Note  Patient: Lindsey Thomas  Procedure(s) Performed: FLEXIBLE SIGMOIDOSCOPY  Patient Location: Endoscopy Unit  Anesthesia Type:General  Level of Consciousness: drowsy  Airway & Oxygen Therapy: Patient Spontanous Breathing  Post-op Assessment: Report given to RN and Post -op Vital signs reviewed and stable  Post vital signs: Reviewed  Last Vitals:  Vitals Value Taken Time  BP 107/46 01/14/22 1352  Temp    Pulse 101 01/14/22 1352  Resp 12 01/14/22 1352  SpO2 94 % 01/14/22 1352    Last Pain:  Vitals:   01/14/22 1307  TempSrc: Temporal  PainSc:       Patients Stated Pain Goal: 2 (91/91/66 0600)  Complications: No notable events documented.

## 2022-01-14 NOTE — Anesthesia Preprocedure Evaluation (Signed)
Anesthesia Evaluation  Patient identified by MRN, date of birth, ID band Patient awake    Reviewed: Allergy & Precautions, NPO status , Patient's Chart, lab work & pertinent test results  History of Anesthesia Complications Negative for: history of anesthetic complications  Airway Mallampati: III  TM Distance: <3 FB Neck ROM: full    Dental  (+) Chipped   Pulmonary shortness of breath and with exertion, asthma , sleep apnea , COPD,    Pulmonary exam normal        Cardiovascular Exercise Tolerance: Good hypertension, (-) anginaNormal cardiovascular exam     Neuro/Psych  Headaches, Anxiety negative psych ROS   GI/Hepatic Neg liver ROS, GERD  Controlled,  Endo/Other  Hypothyroidism   Renal/GU negative Renal ROS  negative genitourinary   Musculoskeletal   Abdominal   Peds  Hematology  (+) Blood dyscrasia, ,   Anesthesia Other Findings Past Medical History: No date: Anxiety No date: Arthritis No date: Asthma No date: DVT (deep venous thrombosis) (HCC) No date: Edema     Comment:  feet No date: Emphysema of lung (HCC) No date: GERD (gastroesophageal reflux disease) No date: Headache     Comment:  MIGRAINES No date: History of orthopnea No date: Hypercholesterolemia No date: Hypothyroidism     Comment:  NODULES No date: Lymphedema No date: Migraine 05/2019: Non-small cell lung cancer, right ([place])     Comment:  Rad tx's No date: Shortness of breath dyspnea     Comment:  WITH EXERTION No date: Sleep apnea     Comment:  MILD, DOES NOT USE CPAP No date: Thyroid nodule  Past Surgical History: No date: ABDOMINAL HYSTERECTOMY No date: BREAST BIOPSY No date: CARDIAC CATHETERIZATION 02/12/2016: CATARACT EXTRACTION W/PHACO; Left     Comment:  Procedure: CATARACT EXTRACTION PHACO AND INTRAOCULAR               LENS PLACEMENT (IOC);  Surgeon: Eulogio Bear, MD;                Location: [facility] ORS;  Service:  Ophthalmology;  Laterality:              Left;  [place] 01:30 AP% 15.2 CDE 13.71 Fluid pack lot #               [id number] 03/18/2016: CATARACT EXTRACTION W/PHACO; Right     Comment:  Procedure: CATARACT EXTRACTION PHACO AND INTRAOCULAR               LENS PLACEMENT (IOC);  Surgeon: Eulogio Bear, MD;                Location: [facility] ORS;  Service: Ophthalmology;  Laterality:              Right;  Lot # [id number] H [place]: 01:05.5 AP%:14.3 CDE: 9.33 No date: FRACTURE SURGERY; Left     Comment:  arm rod and screw No date: KNEE ARTHROSCOPY No date: OOPHORECTOMY No date: RCR 2006: [place]; Left  BMI    Body Mass Index: 37.00 kg/m      Reproductive/Obstetrics negative OB ROS                             Anesthesia Physical Anesthesia Plan  ASA: 3  Anesthesia Plan: General   Post-op Pain Management:    Induction: Intravenous  PONV Risk Score and Plan: Propofol infusion and TIVA  Airway Management Planned: Natural Airway and Nasal Cannula  Additional Equipment:   Intra-op Plan:   Post-operative Plan:   Informed Consent: I have reviewed the patients History and Physical, chart, labs and discussed the procedure including the risks, benefits and alternatives for the proposed anesthesia with the patient or authorized representative who has indicated his/her understanding and acceptance.     Dental Advisory Given  Plan Discussed with: Anesthesiologist, CRNA and Surgeon  Anesthesia Plan Comments: (Patient consented for risks of anesthesia including but not limited to:  - adverse reactions to medications - risk of airway placement if required - damage to eyes, teeth, lips or other oral mucosa - nerve damage due to positioning  - sore throat or hoarseness - Damage to heart, brain, nerves, lungs, other parts of body or loss of life  Patient voiced understanding.)        Anesthesia Quick Evaluation

## 2022-01-15 ENCOUNTER — Encounter: Payer: Self-pay | Admitting: Internal Medicine

## 2022-01-15 DIAGNOSIS — E162 Hypoglycemia, unspecified: Secondary | ICD-10-CM | POA: Diagnosis not present

## 2022-01-15 DIAGNOSIS — R1084 Generalized abdominal pain: Secondary | ICD-10-CM | POA: Diagnosis not present

## 2022-01-15 DIAGNOSIS — K56609 Unspecified intestinal obstruction, unspecified as to partial versus complete obstruction: Secondary | ICD-10-CM | POA: Diagnosis not present

## 2022-01-15 DIAGNOSIS — I509 Heart failure, unspecified: Secondary | ICD-10-CM | POA: Diagnosis not present

## 2022-01-15 DIAGNOSIS — K9171 Accidental puncture and laceration of a digestive system organ or structure during a digestive system procedure: Secondary | ICD-10-CM | POA: Diagnosis not present

## 2022-01-15 DIAGNOSIS — E669 Obesity, unspecified: Secondary | ICD-10-CM | POA: Diagnosis present

## 2022-01-15 DIAGNOSIS — K5732 Diverticulitis of large intestine without perforation or abscess without bleeding: Secondary | ICD-10-CM | POA: Diagnosis not present

## 2022-01-15 DIAGNOSIS — R1010 Upper abdominal pain, unspecified: Secondary | ICD-10-CM | POA: Diagnosis present

## 2022-01-15 DIAGNOSIS — J4520 Mild intermittent asthma, uncomplicated: Secondary | ICD-10-CM | POA: Diagnosis not present

## 2022-01-15 DIAGNOSIS — T8143XA Infection following a procedure, organ and space surgical site, initial encounter: Secondary | ICD-10-CM | POA: Diagnosis not present

## 2022-01-15 DIAGNOSIS — I890 Lymphedema, not elsewhere classified: Secondary | ICD-10-CM | POA: Diagnosis present

## 2022-01-15 DIAGNOSIS — K56691 Other complete intestinal obstruction: Secondary | ICD-10-CM | POA: Diagnosis not present

## 2022-01-15 DIAGNOSIS — K631 Perforation of intestine (nontraumatic): Secondary | ICD-10-CM | POA: Diagnosis not present

## 2022-01-15 DIAGNOSIS — K651 Peritoneal abscess: Secondary | ICD-10-CM | POA: Diagnosis present

## 2022-01-15 DIAGNOSIS — E875 Hyperkalemia: Secondary | ICD-10-CM | POA: Diagnosis not present

## 2022-01-15 DIAGNOSIS — K5720 Diverticulitis of large intestine with perforation and abscess without bleeding: Secondary | ICD-10-CM | POA: Diagnosis present

## 2022-01-15 DIAGNOSIS — I447 Left bundle-branch block, unspecified: Secondary | ICD-10-CM | POA: Diagnosis present

## 2022-01-15 DIAGNOSIS — Z6841 Body Mass Index (BMI) 40.0 and over, adult: Secondary | ICD-10-CM | POA: Diagnosis not present

## 2022-01-15 DIAGNOSIS — K922 Gastrointestinal hemorrhage, unspecified: Secondary | ICD-10-CM | POA: Diagnosis not present

## 2022-01-15 DIAGNOSIS — Z79899 Other long term (current) drug therapy: Secondary | ICD-10-CM | POA: Diagnosis not present

## 2022-01-15 DIAGNOSIS — K9189 Other postprocedural complications and disorders of digestive system: Secondary | ICD-10-CM | POA: Diagnosis not present

## 2022-01-15 DIAGNOSIS — K56699 Other intestinal obstruction unspecified as to partial versus complete obstruction: Secondary | ICD-10-CM | POA: Diagnosis present

## 2022-01-15 DIAGNOSIS — Z86718 Personal history of other venous thrombosis and embolism: Secondary | ICD-10-CM | POA: Diagnosis not present

## 2022-01-15 DIAGNOSIS — Y836 Removal of other organ (partial) (total) as the cause of abnormal reaction of the patient, or of later complication, without mention of misadventure at the time of the procedure: Secondary | ICD-10-CM | POA: Diagnosis not present

## 2022-01-15 DIAGNOSIS — B370 Candidal stomatitis: Secondary | ICD-10-CM | POA: Diagnosis present

## 2022-01-15 DIAGNOSIS — K6819 Other retroperitoneal abscess: Secondary | ICD-10-CM | POA: Diagnosis not present

## 2022-01-15 DIAGNOSIS — R140 Abdominal distension (gaseous): Secondary | ICD-10-CM | POA: Diagnosis not present

## 2022-01-15 DIAGNOSIS — K5733 Diverticulitis of large intestine without perforation or abscess with bleeding: Secondary | ICD-10-CM | POA: Diagnosis not present

## 2022-01-15 DIAGNOSIS — R5381 Other malaise: Secondary | ICD-10-CM | POA: Diagnosis not present

## 2022-01-15 DIAGNOSIS — K6389 Other specified diseases of intestine: Secondary | ICD-10-CM | POA: Diagnosis not present

## 2022-01-15 DIAGNOSIS — E871 Hypo-osmolality and hyponatremia: Secondary | ICD-10-CM | POA: Diagnosis present

## 2022-01-15 DIAGNOSIS — I959 Hypotension, unspecified: Secondary | ICD-10-CM | POA: Diagnosis not present

## 2022-01-15 DIAGNOSIS — Z6836 Body mass index (BMI) 36.0-36.9, adult: Secondary | ICD-10-CM | POA: Diagnosis not present

## 2022-01-15 DIAGNOSIS — E7800 Pure hypercholesterolemia, unspecified: Secondary | ICD-10-CM | POA: Diagnosis present

## 2022-01-15 DIAGNOSIS — R29898 Other symptoms and signs involving the musculoskeletal system: Secondary | ICD-10-CM | POA: Diagnosis not present

## 2022-01-15 DIAGNOSIS — Z7401 Bed confinement status: Secondary | ICD-10-CM | POA: Diagnosis not present

## 2022-01-15 DIAGNOSIS — R109 Unspecified abdominal pain: Secondary | ICD-10-CM | POA: Diagnosis not present

## 2022-01-15 DIAGNOSIS — J439 Emphysema, unspecified: Secondary | ICD-10-CM | POA: Diagnosis present

## 2022-01-15 DIAGNOSIS — I10 Essential (primary) hypertension: Secondary | ICD-10-CM | POA: Diagnosis present

## 2022-01-15 DIAGNOSIS — R000 Tachycardia, unspecified: Secondary | ICD-10-CM | POA: Diagnosis not present

## 2022-01-15 DIAGNOSIS — R911 Solitary pulmonary nodule: Secondary | ICD-10-CM | POA: Diagnosis not present

## 2022-01-15 DIAGNOSIS — E039 Hypothyroidism, unspecified: Secondary | ICD-10-CM | POA: Diagnosis present

## 2022-01-15 DIAGNOSIS — R1011 Right upper quadrant pain: Secondary | ICD-10-CM | POA: Diagnosis not present

## 2022-01-15 DIAGNOSIS — K625 Hemorrhage of anus and rectum: Secondary | ICD-10-CM | POA: Diagnosis not present

## 2022-01-15 DIAGNOSIS — R188 Other ascites: Secondary | ICD-10-CM | POA: Diagnosis not present

## 2022-01-15 DIAGNOSIS — Z923 Personal history of irradiation: Secondary | ICD-10-CM | POA: Diagnosis not present

## 2022-01-15 DIAGNOSIS — E876 Hypokalemia: Secondary | ICD-10-CM | POA: Diagnosis present

## 2022-01-15 DIAGNOSIS — E872 Acidosis, unspecified: Secondary | ICD-10-CM | POA: Diagnosis not present

## 2022-01-15 DIAGNOSIS — K5669 Other partial intestinal obstruction: Secondary | ICD-10-CM | POA: Diagnosis not present

## 2022-01-15 DIAGNOSIS — K5730 Diverticulosis of large intestine without perforation or abscess without bleeding: Secondary | ICD-10-CM | POA: Diagnosis not present

## 2022-01-15 DIAGNOSIS — I110 Hypertensive heart disease with heart failure: Secondary | ICD-10-CM | POA: Diagnosis not present

## 2022-01-15 DIAGNOSIS — I700 Atherosclerosis of aorta: Secondary | ICD-10-CM | POA: Diagnosis present

## 2022-01-15 DIAGNOSIS — R933 Abnormal findings on diagnostic imaging of other parts of digestive tract: Secondary | ICD-10-CM | POA: Diagnosis not present

## 2022-01-15 DIAGNOSIS — K219 Gastro-esophageal reflux disease without esophagitis: Secondary | ICD-10-CM | POA: Diagnosis present

## 2022-01-15 LAB — BASIC METABOLIC PANEL
Anion gap: 7 (ref 5–15)
BUN: 18 mg/dL (ref 8–23)
CO2: 24 mmol/L (ref 22–32)
Calcium: 8.4 mg/dL — ABNORMAL LOW (ref 8.9–10.3)
Chloride: 101 mmol/L (ref 98–111)
Creatinine, Ser: 0.57 mg/dL (ref 0.44–1.00)
GFR, Estimated: >60 mL/min (ref >60)
Glucose, Bld: 135 mg/dL — ABNORMAL HIGH (ref 70–99)
Potassium: 3.7 mmol/L (ref 3.5–5.1)
Sodium: 132 mmol/L — ABNORMAL LOW (ref 135–145)

## 2022-01-15 LAB — GLUCOSE, CAPILLARY: Glucose-Capillary: 156 mg/dL — ABNORMAL HIGH (ref 70–99)

## 2022-01-15 LAB — HEMOGLOBIN: Hemoglobin: 11 g/dL — ABNORMAL LOW (ref 12.0–15.0)

## 2022-01-15 MED ORDER — POLYETHYLENE GLYCOL 3350 17 G PO PACK
34.0000 g | PACK | Freq: Two times a day (BID) | ORAL | Status: DC
  Administered 2022-01-15: 34 g via ORAL
  Filled 2022-01-15: qty 2

## 2022-01-15 MED ORDER — ONDANSETRON HCL 4 MG PO TABS: 4.0000 mg | ORAL_TABLET | Freq: Every day | ORAL | 0 refills | Status: DC | PRN

## 2022-01-15 NOTE — TOC Progression Note (Signed)
Transition of Care [facility]) - Progression Note    Patient Details  Name: Lindsey Thomas MRN: [id number] Date of Birth: [birth year]  Transition of Care [facility]) CM/SW [place], RN Phone Number: 01/15/2022, 11:16 AM  Clinical Narrative:   RNCM discussed Code 34 with patient and spouse.  They would both like case reviewed further, as they feel patient meets criteria for inpatient.  RNCM explained that MD and UR will have to review further.  RNCM sent message to MD, care team and UR for review.  Patient verbalized not feeling well enough today to discharge.  She states her abdomen is still firm and she continues to experience patient.  RNCM left message with care team and MD to follow up.  Patient lives at home with spouse, she feels safe returning home when medically stable.  Patient's spouse states he assists her with care at home.  They do not have home health, nor do patient and family feel a need for home health at this time.  Patient uses cane and walker for ambulation, and has both at home.  Patient and spouse decline TOC needs at present.      Barriers to Discharge: Continued Medical Work up  Expected Discharge Plan and Services     Discharge Planning Services: CM Consult   Living arrangements for the past 2 months: Single Family Home                                       Social Determinants of Health (SDOH) Interventions    Readmission Risk Interventions     No data to display

## 2022-01-15 NOTE — Progress Notes (Signed)
Received MDorder to discharge patient to home, reviewed discharge instructions follow up appointments, home meds and prescriptions with patient and patient verbalized understanding.

## 2022-01-15 NOTE — Anesthesia Postprocedure Evaluation (Signed)
Anesthesia Post Note  Patient: Lindsey Thomas  Procedure(s) Performed: [place]  Patient location during evaluation: Endoscopy Anesthesia Type: General Level of consciousness: awake and alert Pain management: pain level controlled Vital Signs Assessment: post-procedure vital signs reviewed and stable Respiratory status: spontaneous breathing, nonlabored ventilation, respiratory function stable and patient connected to nasal cannula oxygen Cardiovascular status: blood pressure returned to baseline and stable Postop Assessment: no apparent nausea or vomiting Anesthetic complications: no   No notable events documented.   Last Vitals:  Vitals:   01/15/22 0746 01/15/22 1549  BP: (!) 144/115 138/77  Pulse: (!) 110 (!) 110  Resp: 16 18  Temp: 37.2 C 37 C  SpO2: 95% 94%    Last Pain:  Vitals:   01/15/22 0732  TempSrc:   PainSc: 0-No pain                 Precious Haws Khristen Cheyney

## 2022-01-15 NOTE — Discharge Summary (Signed)
Physician Discharge Summary   KEITHA KOLK  female DOB: 1941/05/26  LKG:[id number]  PCP: Idelle Crouch, MD  Admit date: 01/13/2022 Discharge date: 01/15/2022  Admitted From: home Disposition:  home Daughter updated at bedside prior to discharge. CODE STATUS: Full code   Hospital Course:  For full details, please see H&P, progress notes, consult notes and ancillary notes.  Briefly,  Lindsey Thomas is a 81 year old female with history of non-small cell lung cancer, sleep apnea, hypothyroidism, COPD.  She presented with rectal bleeding filling up the commode 2 times at home and bloody bowel movements here.  The patient was unable to finish a colonoscopy prep secondary to nausea, abdominal pain and distention.  Dr. Alice Reichert did a flexible sigmoidoscopy and did not see any further bleeding.  Dr. Alice Reichert did state the patient can go home and follow-up as an outpatient but when we tried to advance her diet she did not eat very well and was nauseous and had abdominal pain.  Pt still had some nausea and abdominal bloating the next day, however able to tolerate her meals, and asked to be discharged.    * Rectal bleeding 2/2 Diverticular bleed.   CT a/p showed Numerous colonic diverticula, particularly in the region of the sigmoid colon.  Flexible sigmoidoscopy did not show any signs of bleeding.  Rectal bleeding likely due to diverticular bleed which has stopped. --Hgb stable around 11's prior to discharge.   Abdominal pain and nausea Abdominal pain and distention.  Patient feels like she needed to move her bowels.  Miralax with fluid given, to be continued at home. --Rx for zofran given at discharge.   Lymphedema Resume home lasix after discharge.   Hypertension Resume home lasix after discharge.   GERD (gastroesophageal reflux disease) Continue PPI   Asthma Stable -Bronchodilators   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.0 to 39.9 BMI 37 with height and weight in the  computer   Lung nodule Hx of lung cancer Small pulmonary nodules measuring 4 mm or less in size of the lung bases.  Will need follow-up with her oncology team as outpatient.   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   Hyponatremia, mild, chronic Na 132.  Unless noted above, medications under "STOP" list are ones pt was not taking PTA.  Discharge Diagnoses:  Principal Problem:   Rectal bleeding Active Problems:   Abdominal pain   Nausea   Lymphedema   Hypertension   GERD (gastroesophageal reflux disease)   Asthma   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.0 to 39.9   Lung nodule   Lung cancer (HCC)   Hyponatremia   Splenic artery aneurysm (HCC)   30 Day Unplanned Readmission Risk Score    Flowsheet Row ED to Hosp-Admission (Current) from 01/13/2022 in [place]  30 Day Unplanned Readmission Risk Score (%) 15.86 Filed at 01/13/2022 1600       This score is the patient's risk of an unplanned readmission within 30 days of being discharged (0 -100%). The score is based on dignosis, age, lab data, medications, orders, and past utilization.   Low:  0-14.9   Medium: 15-21.9   High: 22-29.9   Extreme: 30 and above         Discharge Instructions:  Allergies as of 01/15/2022       Reactions   Eryc [erythromycin]    Levofloxacin Nausea Only   Percocet [oxycodone-acetaminophen] Nausea And Vomiting  Augmentin [amoxicillin-pot Clavulanate] Rash   Celecoxib Palpitations        Medication List     STOP taking these medications    butalbital-acetaminophen-caffeine 50-325-40 MG tablet Commonly known as: FIORICET   ibandronate 150 MG tablet Commonly known as: BONIVA       TAKE these medications    acetaminophen 500 MG tablet Commonly known as: TYLENOL Take 1,000 mg by mouth daily as needed for pain.   aspirin EC 81 MG tablet Take 81 mg by mouth daily.   Calcium Antacid 500 MG chewable  tablet Generic drug: calcium carbonate Chew 2 tablets by mouth daily.   cetirizine 10 MG tablet Commonly known as: ZYRTEC Take 10 mg by mouth at bedtime.   fluticasone 50 MCG/ACT nasal spray Commonly known as: FLONASE Place 2 sprays into both nostrils daily.   furosemide 20 MG tablet Commonly known as: LASIX Take by mouth.   meloxicam 7.5 MG tablet Commonly known as: MOBIC Take 1 tablet by mouth daily.   montelukast 10 MG tablet Commonly known as: SINGULAIR Take 10 mg by mouth at bedtime.   omeprazole 20 MG capsule Commonly known as: PRILOSEC Take 20 mg by mouth 2 (two) times daily before a meal.   ondansetron 4 MG tablet Commonly known as: Zofran Take 1 tablet (4 mg total) by mouth daily as needed for nausea or vomiting.   PRESERVISION AREDS PO Take 1 capsule by mouth 2 (two) times daily.   Symbicort 160-4.5 MCG/ACT inhaler Generic drug: budesonide-formoterol Inhale 2 puffs into the lungs 2 (two) times daily.   Systane 0.4-0.3 % Soln Generic drug: Polyethyl Glycol-Propyl Glycol Apply 1 drop to eye 2 (two) times daily.   Ventolin HFA 108 (90 Base) MCG/ACT inhaler Generic drug: albuterol Inhale 2 puffs into the lungs as needed.   VITAMIN B-12 PO Take 2 tablets by mouth daily.   VITAMIN D PO Take 5,000 Units by mouth daily.         Follow-up Information     Sparks, Leonie Douglas, MD Follow up.   Specialty: Internal Medicine Contact information: [place] [address] [phone]                 Allergies  Allergen Reactions   Eryc [Erythromycin]    Levofloxacin Nausea Only   Percocet [Oxycodone-Acetaminophen] Nausea And Vomiting   Augmentin [Amoxicillin-Pot Clavulanate] Rash   Celecoxib Palpitations     The results of significant diagnostics from this hospitalization (including imaging, microbiology, ancillary and laboratory) are listed below for reference.    Consultations:   Procedures/Studies: CT Angio Abd/Pel w/ and/or w/o  Result Date: 01/13/2022 CLINICAL DATA:  81 year old female with a history of lower GI bleeding EXAM: CTA ABDOMEN AND PELVIS WITHOUT AND WITH CONTRAST TECHNIQUE: Multidetector CT imaging of the abdomen and pelvis was performed using the standard protocol during bolus administration of intravenous contrast. Multiplanar reconstructed images and MIPs were obtained and reviewed to evaluate the vascular anatomy. RADIATION DOSE REDUCTION: This exam was performed according to the departmental dose-optimization program which includes automated exposure control, adjustment of the mA and/or kV according to patient size and/or use of iterative reconstruction technique. CONTRAST:  172mL OMNIPAQUE IOHEXOL 350 MG/ML SOLN COMPARISON:  01/13/2022, 06/23/2017 FINDINGS: VASCULAR Aorta: Mild atherosclerotic changes of the lower thoracic and the abdominal aorta. No pedunculated plaque, ulcerated plaque, dissection, periaortic fluid or inflammatory changes. No aneurysm. Celiac: Celiac artery patent without significant atherosclerotic changes. Typical branching of the celiac artery into  the left gastric artery, common hepatic artery, splenic artery. Rim calcified small aneurysm of the splenic artery at the hilum of the spleen, 7 mm. This appears unchanged dating to the CT 06/23/2017 SMA: SMA patent with minimal atherosclerosis at the origin. Renals: - Right: Single right renal artery, patent. No significant atherosclerotic changes. - Left: Single left renal artery. Mild atherosclerosis at the origin without evidence of high-grade stenosis. IMA: Inferior mesenteric artery is patent. Right lower extremity: Unremarkable course, caliber, and contour of the right iliac system. No aneurysm, dissection, or occlusion. Hypogastric artery is patent. Common femoral artery patent. Proximal SFA and profunda femoris patent. Left lower extremity: Unremarkable course, caliber,  and contour of the left iliac system. No aneurysm, dissection, or occlusion. Hypogastric artery is patent. Common femoral artery patent. Proximal SFA and profunda femoris patent. Veins: Unremarkable appearance of the venous system. Review of the MIP images confirms the above findings. NON-VASCULAR Lower chest: No acute finding of the lower chest. Small pulmonary nodules as identified on earlier CT. Hepatobiliary: Unremarkable appearance of the liver. Unremarkable gall bladder. Pancreas: Unremarkable. Spleen: Unremarkable. Adrenals/Urinary Tract: - Right adrenal gland: Unremarkable - Left adrenal gland: Unremarkable. - Right kidney: No hydronephrosis, nephrolithiasis, inflammation, or ureteral dilation. No focal lesion. - Left Kidney: No hydronephrosis, nephrolithiasis, inflammation, or ureteral dilation. No focal lesion. - Urinary Bladder: Urinary bladder with excreted contrast. Stomach/Bowel: - Stomach: Small hiatal hernia. Otherwise unremarkable stomach with no evidence of puddling of contrast. - Small bowel: Small bowel unremarkable. No abnormal distension. No transition point. No wall thickening. No accumulation of contrast within the lumen. - Appendix: Appendix is not visualized, however, no inflammatory changes are present adjacent to the cecum to indicate an appendicitis. - Colon: Minimal stool burden. Gaseous distension through the length of the colon without a transition point or focal obstruction. Relative decompression in the sigmoid colon. Redemonstration of advanced sigmoid diverticular disease. No acute inflammation. No accumulation of contrast within the lumen of the colon. Lymphatic: No adenopathy. Mesenteric: No free fluid or air. No mesenteric adenopathy. Reproductive: Hysterectomy Other: Fat containing umbilical hernia. Musculoskeletal: No displaced fracture. Multilevel degenerative changes of the spine. Mild degenerative changes of the hips. IMPRESSION: The CT angiogram is negative for acute  gastrointestinal hemorrhage. Advanced left-sided diverticular disease again noted without evidence of acute diverticulitis. Gaseous distension of the colon, without evidence of obstruction. 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 Aortic Atherosclerosis (ICD10-I70.0). Additional ancillary findings as above. Signed, Dulcy Fanny. Nadene Rubins, RPVI [facility] Electronically Signed   By: Corrie Mckusick D.O.   On: 01/13/2022 16:58   CT ABDOMEN PELVIS W CONTRAST  Result Date: 01/13/2022 CLINICAL DATA:  81 year old female with history of left lower quadrant abdominal pain. Bright red blood in stool. EXAM: CT ABDOMEN AND PELVIS WITH CONTRAST TECHNIQUE: Multidetector CT imaging of the abdomen and pelvis was performed using the standard protocol following bolus administration of intravenous contrast. RADIATION DOSE REDUCTION: This exam was performed according to the departmental dose-optimization program which includes automated exposure control, adjustment of the mA and/or kV according to patient size and/or use of iterative reconstruction technique. CONTRAST:  16mL OMNIPAQUE IOHEXOL 300 MG/ML  SOLN COMPARISON:  CT of the abdomen and pelvis 06/23/2017. FINDINGS: Lower chest: Multiple small pulmonary nodules measuring 4 mm or less in size are noted in the visualized lung bases. Atherosclerotic calcifications in the descending thoracic aorta. Hepatobiliary: No suspicious cystic or solid hepatic lesions.  No intra or extrahepatic biliary ductal dilatation. Gallbladder is normal in appearance. Pancreas: No pancreatic mass. No pancreatic ductal dilatation. No pancreatic or peripancreatic fluid collections or inflammatory changes. Spleen: Unremarkable. Adrenals/Urinary Tract: Bilateral kidneys and adrenal glands are normal in appearance. No hydroureteronephrosis. Urinary bladder is normal in appearance.  Stomach/Bowel: The appearance of the stomach is normal. There is no pathologic dilatation of small bowel or colon. Numerous colonic diverticula are noted, particularly in the region of the sigmoid colon, without surrounding inflammatory changes to suggest an acute diverticulitis at this time. The appendix is not confidently identified and may be surgically absent. Regardless, there are no inflammatory changes noted adjacent to the cecum to suggest the presence of an acute appendicitis at this time. Vascular/Lymphatic: Atherosclerosis in the abdominal aorta and pelvic vasculature, without evidence of aneurysm or dissection. No lymphadenopathy noted in the abdomen or pelvis. Reproductive: Status post hysterectomy. Ovaries are not confidently identified may be surgically absent or atrophic. Other: No significant volume of ascites.  No pneumoperitoneum. Musculoskeletal: There are no aggressive appearing lytic or blastic lesions noted in the visualized portions of the skeleton. IMPRESSION: 1. 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 2. No other acute findings are noted elsewhere in the abdomen or pelvis to account for the patient's symptoms. 3. Aortic atherosclerosis. 4. Small pulmonary nodules measuring 4 mm or less in size in the visualize lung bases. This is nonspecific, but statistically likely benign. No follow-up needed if patient is low-risk (and has no known or suspected primary neoplasm). Non-contrast chest CT can be considered in 12 months if patient is high-risk. This recommendation follows the consensus statement: Guidelines for Management of Incidental Pulmonary Nodules Detected on CT Images: From the Fleischner Society 2017; Radiology 2017; 284:228-243. Electronically Signed   By: Vinnie Langton M.D.   On: 01/13/2022 05:14      Labs: BNP (last 3 results) No results for input(s): "BNP" in the last 8760  hours. Basic Metabolic Panel: Recent Labs  Lab 01/12/22 2051 01/15/22 0545  NA 132* 132*  K 4.2 3.7  CL 99 101  CO2 25 24  GLUCOSE 128* 135*  BUN 25* 18  CREATININE 0.87 0.57  CALCIUM 8.6* 8.4*   Liver Function Tests: Recent Labs  Lab 01/12/22 2051  AST 18  ALT 15  ALKPHOS 102  BILITOT 0.6  PROT 6.1*  ALBUMIN 3.9   No results for input(s): "LIPASE", "AMYLASE" in the last 168 hours. No results for input(s): "AMMONIA" in the last 168 hours. CBC: Recent Labs  Lab 01/12/22 2051 01/13/22 0437 01/13/22 1138 01/13/22 1847 01/14/22 0042 01/14/22 0736 01/15/22 0545  WBC 8.4  --  10.0 11.6* 10.8* 10.3  --   HGB 12.8 12.3 11.6* 11.1* 10.4* 11.1* 11.0*  HCT 38.7 37.3 35.0* 33.2* 31.0* 32.9*  --   MCV 89.4  --  89.3 88.3 89.1 88.4  --   PLT 251  --  166 214 210 218  --    Cardiac Enzymes: No results for input(s): "CKTOTAL", "CKMB", "CKMBINDEX", "TROPONINI" in the last 168 hours. BNP: Invalid input(s): "POCBNP" CBG: Recent Labs  Lab 01/14/22 0806 01/15/22 0750  GLUCAP 119* 156*   D-Dimer No results for input(s): "DDIMER" in the last 72 hours. Hgb A1c No results for input(s): "HGBA1C" in the last 72 hours. Lipid Profile No results for input(s): "CHOL", "HDL", "LDLCALC", "TRIG", "CHOLHDL", "LDLDIRECT" in the last 72 hours. Thyroid function studies No results for  input(s): "TSH", "T4TOTAL", "T3FREE", "THYROIDAB" in the last 72 hours.  Invalid input(s): "FREET3" Anemia work up No results for input(s): "VITAMINB12", "FOLATE", "FERRITIN", "TIBC", "IRON", "RETICCTPCT" in the last 72 hours. Urinalysis    Component Value Date/Time   COLORURINE Yellow 07/22/2011 0105   APPEARANCEUR Clear 07/22/2011 0105   LABSPEC 1.016 07/22/2011 0105   PHURINE 5.0 07/22/2011 0105   GLUCOSEU Negative 07/22/2011 0105   HGBUR Negative 07/22/2011 0105   BILIRUBINUR Negative 07/22/2011 0105   KETONESUR Negative 07/22/2011 0105   PROTEINUR Negative 07/22/2011 0105   NITRITE Negative  07/22/2011 0105   LEUKOCYTESUR 3+ 07/22/2011 0105   Sepsis Labs Recent Labs  Lab 01/13/22 1138 01/13/22 1847 01/14/22 0042 01/14/22 0736  WBC 10.0 11.6* 10.8* 10.3   Microbiology No results found for this or any previous visit (from the past 240 hour(s)).   Total time spend on discharging this patient, including the last patient exam, discussing the hospital stay, instructions for ongoing care as it relates to all pertinent caregivers, as well as preparing the medical discharge records, prescriptions, and/or referrals as applicable, is 50 minutes.    Enzo Bi, MD  [facility] 01/15/2022, 3:53 PM

## 2022-01-18 ENCOUNTER — Inpatient Hospital Stay
Admission: EM | Admit: 2022-01-18 | Discharge: 2022-01-30 | DRG: 329 | Disposition: A | Discharge status: Skilled Nursing Facility | Payer: Medicare HMO | Attending: Internal Medicine | Admitting: Internal Medicine

## 2022-01-18 ENCOUNTER — Other Ambulatory Visit: Payer: Self-pay

## 2022-01-18 ENCOUNTER — Emergency Department: Payer: Medicare HMO

## 2022-01-18 DIAGNOSIS — E875 Hyperkalemia: Secondary | ICD-10-CM | POA: Diagnosis not present

## 2022-01-18 DIAGNOSIS — I10 Essential (primary) hypertension: Secondary | ICD-10-CM | POA: Diagnosis present

## 2022-01-18 DIAGNOSIS — E871 Hypo-osmolality and hyponatremia: Secondary | ICD-10-CM | POA: Diagnosis present

## 2022-01-18 DIAGNOSIS — B370 Candidal stomatitis: Secondary | ICD-10-CM | POA: Diagnosis present

## 2022-01-18 DIAGNOSIS — K219 Gastro-esophageal reflux disease without esophagitis: Secondary | ICD-10-CM | POA: Diagnosis present

## 2022-01-18 DIAGNOSIS — K651 Peritoneal abscess: Secondary | ICD-10-CM | POA: Diagnosis present

## 2022-01-18 DIAGNOSIS — R911 Solitary pulmonary nodule: Secondary | ICD-10-CM | POA: Diagnosis not present

## 2022-01-18 DIAGNOSIS — Z86718 Personal history of other venous thrombosis and embolism: Secondary | ICD-10-CM

## 2022-01-18 DIAGNOSIS — R000 Tachycardia, unspecified: Secondary | ICD-10-CM

## 2022-01-18 DIAGNOSIS — Z801 Family history of malignant neoplasm of trachea, bronchus and lung: Secondary | ICD-10-CM

## 2022-01-18 DIAGNOSIS — I959 Hypotension, unspecified: Secondary | ICD-10-CM | POA: Diagnosis not present

## 2022-01-18 DIAGNOSIS — K56699 Other intestinal obstruction unspecified as to partial versus complete obstruction: Principal | ICD-10-CM | POA: Diagnosis present

## 2022-01-18 DIAGNOSIS — Z923 Personal history of irradiation: Secondary | ICD-10-CM

## 2022-01-18 DIAGNOSIS — J439 Emphysema, unspecified: Secondary | ICD-10-CM | POA: Diagnosis present

## 2022-01-18 DIAGNOSIS — E876 Hypokalemia: Secondary | ICD-10-CM

## 2022-01-18 DIAGNOSIS — J45909 Unspecified asthma, uncomplicated: Secondary | ICD-10-CM | POA: Diagnosis present

## 2022-01-18 DIAGNOSIS — Z7951 Long term (current) use of inhaled steroids: Secondary | ICD-10-CM

## 2022-01-18 DIAGNOSIS — R1010 Upper abdominal pain, unspecified: Principal | ICD-10-CM

## 2022-01-18 DIAGNOSIS — I447 Left bundle-branch block, unspecified: Secondary | ICD-10-CM | POA: Diagnosis present

## 2022-01-18 DIAGNOSIS — Z6841 Body Mass Index (BMI) 40.0 and over, adult: Secondary | ICD-10-CM

## 2022-01-18 DIAGNOSIS — K9171 Accidental puncture and laceration of a digestive system organ or structure during a digestive system procedure: Secondary | ICD-10-CM | POA: Diagnosis not present

## 2022-01-18 DIAGNOSIS — J4520 Mild intermittent asthma, uncomplicated: Secondary | ICD-10-CM

## 2022-01-18 DIAGNOSIS — T8143XA Infection following a procedure, organ and space surgical site, initial encounter: Secondary | ICD-10-CM | POA: Diagnosis not present

## 2022-01-18 DIAGNOSIS — K56609 Unspecified intestinal obstruction, unspecified as to partial versus complete obstruction: Secondary | ICD-10-CM | POA: Diagnosis present

## 2022-01-18 DIAGNOSIS — Z79899 Other long term (current) drug therapy: Secondary | ICD-10-CM | POA: Diagnosis not present

## 2022-01-18 DIAGNOSIS — Z803 Family history of malignant neoplasm of breast: Secondary | ICD-10-CM

## 2022-01-18 DIAGNOSIS — Y836 Removal of other organ (partial) (total) as the cause of abnormal reaction of the patient, or of later complication, without mention of misadventure at the time of the procedure: Secondary | ICD-10-CM | POA: Diagnosis not present

## 2022-01-18 DIAGNOSIS — K5720 Diverticulitis of large intestine with perforation and abscess without bleeding: Secondary | ICD-10-CM | POA: Diagnosis present

## 2022-01-18 DIAGNOSIS — E039 Hypothyroidism, unspecified: Secondary | ICD-10-CM | POA: Diagnosis present

## 2022-01-18 DIAGNOSIS — Z7982 Long term (current) use of aspirin: Secondary | ICD-10-CM

## 2022-01-18 DIAGNOSIS — R946 Abnormal results of thyroid function studies: Secondary | ICD-10-CM | POA: Diagnosis present

## 2022-01-18 DIAGNOSIS — E162 Hypoglycemia, unspecified: Secondary | ICD-10-CM | POA: Diagnosis present

## 2022-01-18 DIAGNOSIS — I700 Atherosclerosis of aorta: Secondary | ICD-10-CM | POA: Diagnosis present

## 2022-01-18 DIAGNOSIS — I890 Lymphedema, not elsewhere classified: Secondary | ICD-10-CM | POA: Diagnosis present

## 2022-01-18 DIAGNOSIS — Z90710 Acquired absence of both cervix and uterus: Secondary | ICD-10-CM

## 2022-01-18 DIAGNOSIS — E7800 Pure hypercholesterolemia, unspecified: Secondary | ICD-10-CM | POA: Diagnosis present

## 2022-01-18 DIAGNOSIS — Z825 Family history of asthma and other chronic lower respiratory diseases: Secondary | ICD-10-CM

## 2022-01-18 DIAGNOSIS — E872 Acidosis, unspecified: Secondary | ICD-10-CM | POA: Diagnosis not present

## 2022-01-18 DIAGNOSIS — R109 Unspecified abdominal pain: Secondary | ICD-10-CM | POA: Diagnosis present

## 2022-01-18 DIAGNOSIS — Z818 Family history of other mental and behavioral disorders: Secondary | ICD-10-CM

## 2022-01-18 DIAGNOSIS — E669 Obesity, unspecified: Secondary | ICD-10-CM | POA: Diagnosis present

## 2022-01-18 DIAGNOSIS — R1084 Generalized abdominal pain: Secondary | ICD-10-CM | POA: Diagnosis not present

## 2022-01-18 DIAGNOSIS — D72829 Elevated white blood cell count, unspecified: Secondary | ICD-10-CM | POA: Diagnosis not present

## 2022-01-18 DIAGNOSIS — R5381 Other malaise: Secondary | ICD-10-CM | POA: Diagnosis present

## 2022-01-18 DIAGNOSIS — Z85118 Personal history of other malignant neoplasm of bronchus and lung: Secondary | ICD-10-CM

## 2022-01-18 DIAGNOSIS — Z808 Family history of malignant neoplasm of other organs or systems: Secondary | ICD-10-CM

## 2022-01-18 LAB — CBC
HCT: 34.6 % — ABNORMAL LOW (ref 36.0–46.0)
Hemoglobin: 11.8 g/dL — ABNORMAL LOW (ref 12.0–15.0)
MCH: 30.3 pg (ref 26.0–34.0)
MCHC: 34.1 g/dL (ref 30.0–36.0)
MCV: 88.7 fL (ref 80.0–100.0)
Platelets: 246 10*3/uL (ref 150–400)
RBC: 3.9 MIL/uL (ref 3.87–5.11)
RDW: 14.1 % (ref 11.5–15.5)
WBC: 4.2 10*3/uL (ref 4.0–10.5)
nRBC: 0 % (ref 0.0–0.2)

## 2022-01-18 LAB — BASIC METABOLIC PANEL
Anion gap: 12 (ref 5–15)
BUN: 22 mg/dL (ref 8–23)
CO2: 21 mmol/L — ABNORMAL LOW (ref 22–32)
Calcium: 8.2 mg/dL — ABNORMAL LOW (ref 8.9–10.3)
Chloride: 96 mmol/L — ABNORMAL LOW (ref 98–111)
Creatinine, Ser: 0.58 mg/dL (ref 0.44–1.00)
GFR, Estimated: >60 mL/min (ref >60)
Glucose, Bld: 96 mg/dL (ref 70–99)
Potassium: 3.2 mmol/L — ABNORMAL LOW (ref 3.5–5.1)
Sodium: 129 mmol/L — ABNORMAL LOW (ref 135–145)

## 2022-01-18 LAB — COMPREHENSIVE METABOLIC PANEL
ALT: 27 U/L (ref 0–44)
AST: 29 U/L (ref 15–41)
Albumin: 3.4 g/dL — ABNORMAL LOW (ref 3.5–5.0)
Alkaline Phosphatase: 75 U/L (ref 38–126)
Anion gap: 10 (ref 5–15)
BUN: 22 mg/dL (ref 8–23)
CO2: 23 mmol/L (ref 22–32)
Calcium: 8.5 mg/dL — ABNORMAL LOW (ref 8.9–10.3)
Chloride: 94 mmol/L — ABNORMAL LOW (ref 98–111)
Creatinine, Ser: 0.84 mg/dL (ref 0.44–1.00)
GFR, Estimated: >60 mL/min (ref >60)
Glucose, Bld: 145 mg/dL — ABNORMAL HIGH (ref 70–99)
Potassium: 3.4 mmol/L — ABNORMAL LOW (ref 3.5–5.1)
Sodium: 127 mmol/L — ABNORMAL LOW (ref 135–145)
Total Bilirubin: 1.1 mg/dL (ref 0.3–1.2)
Total Protein: 5.9 g/dL — ABNORMAL LOW (ref 6.5–8.1)

## 2022-01-18 LAB — TYPE AND SCREEN
ABO/RH(D): O POS
Antibody Screen: NEGATIVE

## 2022-01-18 LAB — LIPASE, BLOOD: Lipase: 45 U/L (ref 11–51)

## 2022-01-18 LAB — OSMOLALITY: Osmolality: 272 mOsm/kg — ABNORMAL LOW (ref 275–295)

## 2022-01-18 LAB — MAGNESIUM: Magnesium: 1.9 mg/dL (ref 1.7–2.4)

## 2022-01-18 LAB — TROPONIN I (HIGH SENSITIVITY): Troponin I (High Sensitivity): 7 ng/L (ref <18)

## 2022-01-18 LAB — PROTIME-INR
INR: 1.2 (ref 0.8–1.2)
Prothrombin Time: 14.9 seconds (ref 11.4–15.2)

## 2022-01-18 LAB — APTT: aPTT: 29 seconds (ref 24–36)

## 2022-01-18 MED ORDER — ACETAMINOPHEN 325 MG PO TABS
650.0000 mg | ORAL_TABLET | Freq: Four times a day (QID) | ORAL | Status: DC | PRN
  Administered 2022-01-24: 650 mg via ORAL
  Filled 2022-01-18: qty 2

## 2022-01-18 MED ORDER — SODIUM CHLORIDE 0.9 % IV SOLN: INTRAVENOUS | Status: DC

## 2022-01-18 MED ORDER — POLYVINYL ALCOHOL 1.4 % OP SOLN
1.0000 [drp] | Freq: Two times a day (BID) | OPHTHALMIC | Status: DC
Start: 2022-01-18 — End: 2022-01-30
  Administered 2022-01-18 – 2022-01-30 (×22): 1 [drp] via OPHTHALMIC
  Filled 2022-01-18 (×2): qty 15

## 2022-01-18 MED ORDER — FLUTICASONE PROPIONATE 50 MCG/ACT NA SUSP
2.0000 | Freq: Every day | NASAL | Status: DC
  Administered 2022-01-18 – 2022-01-30 (×9): 2 via NASAL
  Filled 2022-01-18 (×2): qty 16

## 2022-01-18 MED ORDER — SUCRALFATE 1 G PO TABS
1.0000 g | ORAL_TABLET | Freq: Once | ORAL | Status: AC
  Administered 2022-01-18: 1 g via ORAL
  Filled 2022-01-18: qty 1

## 2022-01-18 MED ORDER — SODIUM CHLORIDE 0.9 % IV BOLUS
500.0000 mL | Freq: Once | INTRAVENOUS | Status: AC
  Administered 2022-01-18: 500 mL via INTRAVENOUS

## 2022-01-18 MED ORDER — ONDANSETRON HCL 4 MG/2ML IJ SOLN
4.0000 mg | Freq: Three times a day (TID) | INTRAMUSCULAR | Status: DC | PRN
  Administered 2022-01-24: 4 mg via INTRAVENOUS
  Filled 2022-01-18: qty 2

## 2022-01-18 MED ORDER — HYDRALAZINE HCL 20 MG/ML IJ SOLN
5.0000 mg | INTRAMUSCULAR | Status: DC | PRN
Start: 2022-01-18 — End: 2022-01-30
  Administered 2022-01-23: 5 mg via INTRAVENOUS
  Filled 2022-01-18 (×3): qty 1

## 2022-01-18 MED ORDER — MOMETASONE FURO-FORMOTEROL FUM 200-5 MCG/ACT IN AERO
2.0000 | INHALATION_SPRAY | Freq: Two times a day (BID) | RESPIRATORY_TRACT | Status: DC
  Administered 2022-01-18 – 2022-01-30 (×22): 2 via RESPIRATORY_TRACT
  Filled 2022-01-18 (×2): qty 8.8

## 2022-01-18 MED ORDER — POTASSIUM CHLORIDE CRYS ER 20 MEQ PO TBCR
40.0000 meq | EXTENDED_RELEASE_TABLET | Freq: Once | ORAL | Status: AC
  Administered 2022-01-18: 40 meq via ORAL
  Filled 2022-01-18: qty 2

## 2022-01-18 MED ORDER — MORPHINE SULFATE (PF) 2 MG/ML IV SOLN: 0.5000 mg | INTRAVENOUS | Status: DC | PRN

## 2022-01-18 MED ORDER — PANTOPRAZOLE SODIUM 40 MG PO TBEC
40.0000 mg | DELAYED_RELEASE_TABLET | Freq: Every day | ORAL | Status: DC
  Administered 2022-01-18 – 2022-01-30 (×11): 40 mg via ORAL
  Filled 2022-01-18 (×11): qty 1

## 2022-01-18 MED ORDER — OCUVITE-LUTEIN PO CAPS
1.0000 | ORAL_CAPSULE | Freq: Every day | ORAL | Status: DC
  Administered 2022-01-21 – 2022-01-25 (×5): 1 via ORAL
  Filled 2022-01-18 (×10): qty 1

## 2022-01-18 MED ORDER — MONTELUKAST SODIUM 10 MG PO TABS
10.0000 mg | ORAL_TABLET | Freq: Every day | ORAL | Status: DC
  Administered 2022-01-18 – 2022-01-29 (×9): 10 mg via ORAL
  Filled 2022-01-18 (×9): qty 1

## 2022-01-18 MED ORDER — DM-GUAIFENESIN ER 30-600 MG PO TB12: 1.0000 | ORAL_TABLET | Freq: Two times a day (BID) | ORAL | Status: DC | PRN

## 2022-01-18 MED ORDER — METOCLOPRAMIDE HCL 5 MG/ML IJ SOLN
10.0000 mg | INTRAMUSCULAR | Status: AC
  Administered 2022-01-18: 10 mg via INTRAVENOUS
  Filled 2022-01-18: qty 2

## 2022-01-18 MED ORDER — ALBUTEROL SULFATE (2.5 MG/3ML) 0.083% IN NEBU: 2.5000 mg | INHALATION_SOLUTION | RESPIRATORY_TRACT | Status: DC | PRN

## 2022-01-18 MED ORDER — ONDANSETRON HCL 4 MG/2ML IJ SOLN
4.0000 mg | Freq: Once | INTRAMUSCULAR | Status: AC
Start: 2022-01-18 — End: 2022-01-18
  Administered 2022-01-18: 4 mg via INTRAVENOUS
  Filled 2022-01-18: qty 2

## 2022-01-18 NOTE — Consult Note (Signed)
Consultation  Referring Provider:     ED Admit date: 01/18/2022 Consult date: 01/18/2022         Reason for Consultation: Abnormal imaging             HPI:   Lindsey Thomas is a 81 y.o. lady with history of NSCLC, hypothyrodisim, and COPD here for abdominal bloating. Was recently discharged after episode of diverticular bleeding 4 days ago. Flex sig performed but had poor prep. Patient has had long history with diverticulosis with occasional diverticulitis. No opioid use but she has been using meloxicam for years. For months prior to these presentation she has been getting full really quickly. She complains of diarrhea, especially after eating anything. No family history of colon cancer. Has a history of hysterectomy.   Past Medical History:  Diagnosis Date   Anxiety    Arthritis    Asthma    DVT (deep venous thrombosis) (HCC)    Edema    feet   Emphysema of lung (HCC)    GERD (gastroesophageal reflux disease)    Headache    MIGRAINES   History of orthopnea    Hypercholesterolemia    Hypothyroidism    NODULES   Lymphedema    Migraine    Non-small cell lung cancer, right ([place]) 05/2019   Rad tx's   Shortness of breath dyspnea    WITH EXERTION   Sleep apnea    MILD, DOES NOT USE CPAP   Thyroid nodule     Past Surgical History:  Procedure Laterality Date   ABDOMINAL HYSTERECTOMY     BREAST BIOPSY     CARDIAC CATHETERIZATION     CATARACT EXTRACTION W/PHACO Left 02/12/2016   Procedure: CATARACT EXTRACTION PHACO AND INTRAOCULAR LENS PLACEMENT ([place]);  Surgeon: Eulogio Bear, MD;  Location: [facility] ORS;  Service: Ophthalmology;  Laterality: Left;  [place] 01:30 AP% 15.2 CDE 13.71 Fluid pack lot # [id number]   CATARACT EXTRACTION W/PHACO Right 03/18/2016   Procedure: CATARACT EXTRACTION PHACO AND INTRAOCULAR LENS PLACEMENT ([facility]);  Surgeon: Eulogio Bear, MD;  Location: [facility] OR[facility];  Service: Ophthalmology;  Laterality: Right;  Lot # [id number] H [place]: 01:05.5 AP%:14.3 CDE: 9.33    FLEXIBLE SIGMOIDOSCOPY N/A 01/14/2022   Procedure: FLEXIBLE SIGMOIDOSCOPY;  Surgeon: Toledo, Benay Pike, MD;  Location: [facility];  Service: Gastroenterology;  Laterality: N/A;   FRACTURE SURGERY Left    arm rod and screw   KNEE ARTHROSCOPY     OOPHORECTOMY     RCR     ROTATOR CUFF REPAIR Left 2006    Family History  Problem Relation Age of Onset   Emphysema Mother    Heart Problems Mother    Tuberculosis Father    Brain cancer Father    Skin cancer Father    Breast cancer Sister    Irritable bowel syndrome Sister    Lung cancer Brother    Dementia Sister    Schizophrenia Sister     Social History   Tobacco Use   Smoking status: Never   Smokeless tobacco: Never  Substance Use Topics   Alcohol use: No   Drug use: Never    Prior to Admission medications   Medication Sig Start Date End Date Taking? Authorizing Provider  acetaminophen (TYLENOL) 500 MG tablet Take 1,000 mg by mouth daily as needed for pain.    [provider]  aspirin EC 81 MG tablet Take 81 mg by mouth daily.    [provider]  calcium carbonate (CALCIUM ANTACID) 500  MG chewable tablet Chew 2 tablets by mouth daily.    [provider]  cetirizine (ZYRTEC) 10 MG tablet Take 10 mg by mouth at bedtime.     [provider]  Cholecalciferol (VITAMIN D PO) Take 5,000 Units by mouth daily.    [provider]  Cyanocobalamin (VITAMIN B-12 PO) Take 2 tablets by mouth daily.    [provider]  fluticasone (FLONASE) 50 MCG/ACT nasal spray Place 2 sprays into both nostrils daily. 07/26/18   [provider]  furosemide (LASIX) 20 MG tablet Take by mouth.  10/10/17 01/13/22  [provider]  meloxicam (MOBIC) 7.5 MG tablet Take 1 tablet by mouth daily. 10/15/21   [provider]  montelukast (SINGULAIR) 10 MG tablet Take 10 mg by mouth at bedtime.     [provider]  Multiple Vitamins-Minerals (PRESERVISION AREDS PO) Take 1 capsule  by mouth 2 (two) times daily.    [provider]  omeprazole (PRILOSEC) 20 MG capsule Take 20 mg by mouth 2 (two) times daily before a meal.    [provider]  ondansetron (ZOFRAN) 4 MG tablet Take 1 tablet (4 mg total) by mouth daily as needed for nausea or vomiting. 01/15/22 01/15/23  Enzo Bi, MD  Polyethyl Glycol-Propyl Glycol (SYSTANE) 0.4-0.3 % SOLN Apply 1 drop to eye 2 (two) times daily.    [provider]  SYMBICORT 160-4.5 MCG/ACT inhaler Inhale 2 puffs into the lungs 2 (two) times daily. 06/19/21   [provider]  VENTOLIN HFA 108 (90 Base) MCG/ACT inhaler Inhale 2 puffs into the lungs as needed. 02/16/19   [provider]    No current facility-administered medications for this encounter.   Current Outpatient Medications  Medication Sig Dispense Refill   acetaminophen (TYLENOL) 500 MG tablet Take 1,000 mg by mouth daily as needed for pain.     aspirin EC 81 MG tablet Take 81 mg by mouth daily.     calcium carbonate (CALCIUM ANTACID) 500 MG chewable tablet Chew 2 tablets by mouth daily.     cetirizine (ZYRTEC) 10 MG tablet Take 10 mg by mouth at bedtime.      Cholecalciferol (VITAMIN D PO) Take 5,000 Units by mouth daily.     Cyanocobalamin (VITAMIN B-12 PO) Take 2 tablets by mouth daily.     fluticasone (FLONASE) 50 MCG/ACT nasal spray Place 2 sprays into both nostrils daily.     furosemide (LASIX) 20 MG tablet Take by mouth.      meloxicam (MOBIC) 7.5 MG tablet Take 1 tablet by mouth daily.     montelukast (SINGULAIR) 10 MG tablet Take 10 mg by mouth at bedtime.      Multiple Vitamins-Minerals (PRESERVISION AREDS PO) Take 1 capsule by mouth 2 (two) times daily.     omeprazole (PRILOSEC) 20 MG capsule Take 20 mg by mouth 2 (two) times daily before a meal.     ondansetron (ZOFRAN) 4 MG tablet Take 1 tablet (4 mg total) by mouth daily as needed for nausea or vomiting. 20 tablet 0   Polyethyl Glycol-Propyl Glycol (SYSTANE) 0.4-0.3 % SOLN  Apply 1 drop to eye 2 (two) times daily.     SYMBICORT 160-4.5 MCG/ACT inhaler Inhale 2 puffs into the lungs 2 (two) times daily.     VENTOLIN HFA 108 (90 Base) MCG/ACT inhaler Inhale 2 puffs into the lungs as needed.      Allergies as of 01/18/2022 - Review Complete 01/18/2022  Allergen Reaction Noted  Eryc [erythromycin]  02/03/2016   Levofloxacin Nausea Only 11/26/2021   Percocet [oxycodone-acetaminophen] Nausea And Vomiting 02/03/2016   Augmentin [amoxicillin-pot clavulanate] Rash 02/03/2016   Celecoxib Palpitations 02/03/2016     Review of Systems:    All systems reviewed and negative except where noted in HPI.  Review of Systems  Constitutional:  Negative for chills and fever.  Respiratory:  Negative for cough.   Cardiovascular:  Negative for chest pain.  Gastrointestinal:  Positive for diarrhea and nausea. Negative for blood in stool, constipation, melena and vomiting.  Musculoskeletal:  Positive for joint pain.  Skin:  Negative for rash.  Neurological:  Negative for focal weakness.  Psychiatric/Behavioral:  Negative for substance abuse.   All other systems reviewed and are negative.     Physical Exam:  Vital signs in last 24 hours: Temp:  [97.9 F (36.6 C)-98.7 F (37.1 C)] 98.7 F (37.1 C) (08/14 1500) Pulse Rate:  [100-130] 100 (08/14 1500) Resp:  [18-20] 20 (08/14 1500) BP: (118-135)/(73-88) 121/78 (08/14 1500) SpO2:  [95 %-96 %] 96 % (08/14 1500) Weight:  [77 kg] 77 kg (08/14 0919)   General:   Pleasant in NAD Head:  Normocephalic and atraumatic. Eyes:   No icterus.   Conjunctiva pink. Mouth: Mucosa pink moist, no lesions. Neck:  Supple; no masses felt Lungs:  No respiratory distress Abdomen:   Distended, tympanic Rectal:  Not performed.  Msk:  MAEW x4, No clubbing or cyanosis. Strength 5/5. Symmetrical without gross deformities. Neurologic:  Alert and  oriented x4;  Cranial nerves II-XII intact.  Skin:  Warm, dry, pink without significant lesions or  rashes. Psych:  Alert and cooperative. Normal affect.  LAB RESULTS: Recent Labs    01/18/22 0929  WBC 4.2  HGB 11.8*  HCT 34.6*  PLT 246   BMET Recent Labs    01/18/22 0929  NA 127*  K 3.4*  CL 94*  CO2 23  GLUCOSE 145*  BUN 22  CREATININE 0.84  CALCIUM 8.5*   LFT Recent Labs    01/18/22 0929  PROT 5.9*  ALBUMIN 3.4*  AST 29  ALT 27  ALKPHOS 75  BILITOT 1.1   PT/INR No results for input(s): "LABPROT", "INR" in the last 72 hours.  STUDIES: US ABDOMEN LIMITED RUQ (LIVER/GB)  Result Date: 01/18/2022 CLINICAL DATA:  Upper abdominal pain, worse with eating. EXAM: ULTRASOUND ABDOMEN LIMITED RIGHT UPPER QUADRANT COMPARISON:  CT 01/13/2022 FINDINGS: Gallbladder: Small amount of gallbladder sludge. No gallstones. Wall thickness upper limits of normal. No Murphy sign. Common bile duct: Diameter: 2.4 mm.  Normal. Liver: No focal lesion identified. Within normal limits in parenchymal echogenicity. Portal vein is patent on color Doppler imaging with normal direction of blood flow towards the liver. Other: Tiny amount of ascites noted, etiology unknown. IMPRESSION: Small amount of gallbladder sludge. No gallstones. No ductal obstruction. Liver parenchyma is normal. Small amount of ascites. Electronically Signed   By: Nelson Chimes M.D.   On: 01/18/2022 16:02   CT ABDOMEN PELVIS WO CONTRAST  Result Date: 01/18/2022 CLINICAL DATA:  Nausea, vomiting, recent GI bleed and hospitalization, bloating, increased pain with eating, diarrhea, had colonoscopy during hospitalization, suspected bowel obstruction EXAM: CT ABDOMEN AND PELVIS WITHOUT CONTRAST TECHNIQUE: Multidetector CT imaging of the abdomen and pelvis was performed following the standard protocol without IV contrast. RADIATION DOSE REDUCTION: This exam was performed according to the departmental dose-optimization program which includes automated exposure control, adjustment of the mA and/or kV according to patient size and/or use of  iterative reconstruction technique. COMPARISON:  01/13/2022 FINDINGS: Lower chest: Tiny calcified pulmonary granuloma RIGHT lung base. Nodular density LEFT lower lobe 3 mm diameter image 14 unchanged. Additional small branching density posterior RIGHT lower lobe question mucoid impaction. Hepatobiliary: Increased attenuation within gallbladder, likely vicarious excretion of prior contrast. Single tiny high attenuation focus dependently question cholelithiasis. Liver unremarkable. Pancreas: Atrophic pancreas without mass Spleen: Normal appearance Adrenals/Urinary Tract: Adrenal glands, kidneys, ureters, and bladder normal appearance Stomach/Bowel: Appendix not visualized. Sigmoid diverticulosis. Dilated colon until mid sigmoid colon increased since previous study. Abrupt change in colonic caliber is identified at mid sigmoid colon, series 2, image 72. This did not appear mass-like on the recent CT exam and appears more prominent on the current study over a length of approximately 6 cm. Colon distal to this site is decompressed. Dilated small bowel loops new since previous exam. Stomach decompressed. Vascular/Lymphatic: Atherosclerotic calcifications aorta without aneurysm. No adenopathy. Reproductive: Uterus surgically absent with nonvisualization of ovaries Other: Small amount of free fluid in pelvis and at RIGHT gutter. No free air. No hernia. Musculoskeletal: Osseous demineralization. IMPRESSION: Increased dilatation of the colon until mid sigmoid colon with abrupt change in colonic caliber over a length of approximately 6 cm, associated with a small amount of free fluid in pelvis and at RIGHT gutter as well as diffuse small bowel dilatation. Findings are most consistent with sigmoid colonic obstruction, question stricture, with no definite mass seen at this site by sigmoidoscopy or prior CT. Sigmoid diverticulosis without evidence of diverticulitis. Small amount of free intraperitoneal fluid without free air.  Aortic Atherosclerosis (ICD10-I70.0). Electronically Signed   By: Lavonia Dana M.D.   On: 01/18/2022 15:28       Impression / Plan:   81 y/o lady with history of NSCLC, hypothyrodisim, and COPD here with abdominal bloating and CT imaging concerning for large bowel obstruction from possible stricture. Concerned this could be due to diverticulosis so given poor prep on most recent flex sig, will repeat flex sig.  - NPO except meds and sips of liquids - flex sig tomorrow, will order enemas to be given before procedure - low threshold for NG tube if any vomiting - no opioid pains medicines  Raylene Miyamoto MD, MPH Home

## 2022-01-18 NOTE — Assessment & Plan Note (Signed)
Stable -Bronchodilators 

## 2022-01-18 NOTE — Assessment & Plan Note (Addendum)
Postoperative day 4.  Patient also had cecal and transverse colon repair along with sigmoidectomy. Continue IV fluids and supportive care.  Empiric Flagyl and Rocephin.  With patient having low-grade temperature, metabolic acidosis and increased white count, general surgery ordering a CT scan of the abdomen pelvis to rule out intra-abdominal infection.

## 2022-01-18 NOTE — Assessment & Plan Note (Addendum)
With hyperkalemia today we will stop potassium in IV fluids

## 2022-01-18 NOTE — ED Provider Notes (Signed)
[facility] Provider Note    Event Date/Time   First MD Initiated Contact with Patient 01/18/22 1416     (approximate)   History   Chief Complaint: Abdominal Pain   HPI  Lindsey Thomas is a 81 y.o. female with a history of GERD hypothyroidism DVT and right-sided lung cancer who comes the ED complaining of generalized abdominal bloating and upper abdominal pain that is worse with eating.  She notes that pain with eating has caused very poor oral intake which has been going on for many weeks  She was recently hospitalized due to GI bleed with melena.  During that time, she had a colonoscopy which was complicated by very poor preparation because of her inability to take oral intake very well.  It was noted that she has diverticulosis, and the bleeding had appeared to have stopped and she was discharged back home.  However, she notes that her symptoms are not improved.  An upper endoscopy was not done at that time.  Denies any prior abdominal surgery or hernias.     Physical Exam   Triage Vital Signs: ED Triage Vitals  Enc Vitals Group     BP 01/18/22 0918 135/88     Pulse Rate 01/18/22 0918 (!) 130     Resp 01/18/22 0918 18     Temp 01/18/22 0918 97.9 F (36.6 C)     Temp Source 01/18/22 0918 Oral     SpO2 01/18/22 0918 95 %     Weight 01/18/22 0919 169 lb 12.1 oz (77 kg)     Height 01/18/22 0919 4\' 9"  (1.448 m)     Head Circumference --      Peak Flow --      Pain Score 01/18/22 0919 2     Pain Loc --      Pain Edu? --      Excl. in [place]? --     Most recent vital signs: Vitals:   01/18/22 1404 01/18/22 1500  BP: 118/73 121/78  Pulse: (!) 108 100  Resp: 18 20  Temp:  98.7 F (37.1 C)  SpO2: 96% 96%    General: Awake, no distress.  CV:  Good peripheral perfusion.  Tachycardia heart rate 100.  Regular rhythm. Resp:  Normal effort.  Clear to auscultation bilaterally Abd:   Soft with mild generalized tenderness, worse in the upper abdomen.   Abdomen is distended and tympanitic. Other:  Mucous membranes.  No lower extremity edema   ED Results / Procedures / Treatments   Labs (all labs ordered are listed, but only abnormal results are displayed) Labs Reviewed  COMPREHENSIVE METABOLIC PANEL - Abnormal; Notable for the following components:      Result Value   Sodium 127 (*)    Potassium 3.4 (*)    Chloride 94 (*)    Glucose, Bld 145 (*)    Calcium 8.5 (*)    Total Protein 5.9 (*)    Albumin 3.4 (*)    All other components within normal limits  CBC - Abnormal; Notable for the following components:   Hemoglobin 11.8 (*)    HCT 34.6 (*)    All other components within normal limits  LIPASE, BLOOD  URINALYSIS, ROUTINE W REFLEX MICROSCOPIC  TROPONIN I (HIGH SENSITIVITY)     EKG Interpreted by me Sinus tachycardia rate 102.  Right axis, left bundle branch block, no acute ischemic changes.   RADIOLOGY CT abdomen pelvis interpreted by me, shows markedly distended  colon.  Radiology report reviewed, noting findings consistent with sigmoid colon obstruction without apparent mass, without free air.  Ultrasound right upper quadrant pending   PROCEDURES:  Procedures   MEDICATIONS ORDERED IN ED: Medications  sodium chloride 0.9 % bolus 500 mL (500 mLs Intravenous New Bag/Given 01/18/22 1449)  ondansetron (ZOFRAN) injection 4 mg (4 mg Intravenous Given 01/18/22 1447)  sucralfate (CARAFATE) tablet 1 g (1 g Oral Given 01/18/22 1443)  metoCLOPramide (REGLAN) injection 10 mg (10 mg Intravenous Given 01/18/22 1448)     IMPRESSION / MDM / ASSESSMENT AND PLAN / ED COURSE  I reviewed the triage vital signs and the nursing notes.                              Differential diagnosis includes, but is not limited to, bowel obstruction, hernia, GI perforation, intra-abdominal abscess, gastric ulcer, biliary disease  Patient's presentation is most consistent with acute presentation with potential threat to life or bodily  function.  Patient presents with persistent abdominal bloating and pain after recent hospitalization and unsuccessful colonoscopy.  We will give IV fluids, Reglan Zofran and Carafate for symptom relief.  Labs are unremarkable except for mild hyponatremia.  We will obtain CT scan and ultrasound.       FINAL CLINICAL IMPRESSION(S) / ED DIAGNOSES   Final diagnoses:  Upper abdominal pain     Rx / DC Orders   ED Discharge Orders     None        Note:  This document was prepared using Dragon voice recognition software and may include unintentional dictation errors.   Carrie Mew, MD 01/18/22 1540

## 2022-01-18 NOTE — Assessment & Plan Note (Deleted)
IV metoprolol as needed

## 2022-01-18 NOTE — ED Provider Notes (Addendum)
Patient signed out to me pending CT abdomen pelvis.  81 year old female presents with abdominal pain.  Recently had been hospitalized for rectal bleeding.  Hospitalization she was unable to finish the colonoscopy prep secondary to nausea and abdominal pain did have a flex sig that did not show any signs or other bleeding.  Presenting today for increased bloating and abdominal discomfort.  T abdomen pelvis shows likely sigmoid colonic obstruction.  I have discussed with Dr. Peyton Najjar, who recommends discussing with GI since they did the flex sig 4 days ago.  I discussed with Dr. Haig Prophet as well who says that they will see the patient and potentially repeat flex sig.  Will admit to the hospitalist service.  Patient not having any significant vomiting do not feel that she would benefit from NG tube now.   Rada Hay, MD 01/18/22 1642    Rada Hay, MD 01/18/22 [phone]

## 2022-01-18 NOTE — Assessment & Plan Note (Addendum)
Patient has left bundle blockage on EKG. outpatient stress test which was negative in April 2023.

## 2022-01-18 NOTE — Assessment & Plan Note (Addendum)
BMI 36.73 with height and weight in computer.

## 2022-01-18 NOTE — ED Triage Notes (Signed)
Pt to ED via POV from home. Pt recently admitted for GI bleed and discharged on Friday. Pt had colonoscopy but they were not able to fully clean her out. Pt reports since being bloated, increased pain with eating, diarrhea. Pt denies noticing blood or dark tarry stools recently.

## 2022-01-18 NOTE — Assessment & Plan Note (Addendum)
Last sodium normal range at 139

## 2022-01-18 NOTE — H&P (Signed)
History and Physical    Lindsey Thomas RCB:[id number] DOB: [birth year] DOA: 01/18/2022  Referring MD/NP/PA:   PCP: Idelle Crouch, MD   Patient coming from:  The patient is coming from home.  At baseline, pt is independent for most of ADL.        Chief Complaint: Abdominal pain and abdominal distention  HPI: Lindsey Thomas is a 81 y.o. female with medical history significant of HTH, HLD, asthma, GERD, hypothyroidism, obesity obesity BMI 37.00, NSCLC (s/p of radiation therapy, no surgery or chemo), DVT not on AC, lymphedema, GI bleeding, who presents with abdominal pain.  Patient was recently hospitalized from a 8/9 - 8/11 due to diverticular bleeding. CT a/p showed Numerous colonic diverticula, particularly in the region of the sigmoid colon.  Flexible sigmoidoscopy did not show any signs of bleeding.  Rectal bleeding has stopped.  She developed abdominal pain and abdominal distention today.  The pain is diffused, mild to moderate, constant, nonradiating.  Patient has poor appetite and decreased oral intake.  She has some nausea, no vomiting or diarrhea.  Last bowel movement was in this morning, no rectal bleeding.  Denies chest pain, cough, shortness of breath.  No symptoms of UTI.  Data reviewed independently and ED Course: pt was found to have WBC 4.2, troponin level 7, stable hemoglobin 11.8, GFR> 60, sodium 127, potassium 3.4, temperature normal, blood pressure 121/78, heart rate 130, 100, RR 20, oxygen saturation 96% on room air.  CT scan of abdomen/pelvis showed possible sigmoid colon obstruction.  Right upper quadrant ultrasound showed small gallbladder sludge.  Patient is admitted to telemetry bed as inpatient.  Dr. Haig Prophet of GI is consulted.  CT-abd/pelvis: Increased dilatation of the colon until mid sigmoid colon with abrupt change in colonic caliber over a length of approximately 6 cm, associated with a small amount of free fluid in pelvis and at RIGHT gutter as well as  diffuse small bowel dilatation.   Findings are most consistent with sigmoid colonic obstruction, question stricture, with no definite mass seen at this site by sigmoidoscopy or prior CT.   Sigmoid diverticulosis without evidence of diverticulitis.   Small amount of free intraperitoneal fluid without free air.   Aortic Atherosclerosis (ICD10-I70.0).  EKG: I have personally reviewed.  Sinus rhythm, QTc 482, LAE, left bundle blockade, poor R wave progression   Review of Systems:   General: no fevers, chills, no body weight gain, has poor appetite, has fatigue HEENT: no blurry vision, hearing changes or sore throat Respiratory: no dyspnea, coughing, wheezing CV: no chest pain, no palpitations GI:  has nausea, abdominal distention, abdominal pain, no vomiting,diarrhea, constipation GU: no dysuria, burning on urination, increased urinary frequency, hematuria  Ext: has trace leg edema Neuro: no unilateral weakness, numbness, or tingling, no vision change or hearing loss Skin: no rash, no skin tear. MSK: No muscle spasm, no deformity, no limitation of range of movement in spin Heme: No easy bruising.  Travel history: No recent long distant travel.   Allergy:  Allergies  Allergen Reactions   Eryc [Erythromycin]    Levofloxacin Nausea Only   Percocet [Oxycodone-Acetaminophen] Nausea And Vomiting   Augmentin [Amoxicillin-Pot Clavulanate] Rash   Celecoxib Palpitations    Past Medical History:  Diagnosis Date   Anxiety    Arthritis    Asthma    DVT (deep venous thrombosis) (HCC)    Edema    feet   Emphysema of lung (HCC)    GERD (gastroesophageal reflux disease)  Headache    MIGRAINES   History of orthopnea    Hypercholesterolemia    Hypothyroidism    NODULES   Lymphedema    Migraine    Non-small cell lung cancer, right ([place]) 05/2019   Rad tx's   Shortness of breath dyspnea    WITH EXERTION   Sleep apnea    MILD, DOES NOT USE CPAP   Thyroid nodule     Past  Surgical History:  Procedure Laterality Date   ABDOMINAL HYSTERECTOMY     BREAST BIOPSY     CARDIAC CATHETERIZATION     CATARACT EXTRACTION W/PHACO Left 02/12/2016   Procedure: CATARACT EXTRACTION PHACO AND INTRAOCULAR LENS PLACEMENT ([place]);  Surgeon: Eulogio Bear, MD;  Location: [facility];  Service: Ophthalmology;  Laterality: Left;  [place] 01:30 AP% 15.2 CDE 13.71 Fluid pack lot # [id number]   CATARACT EXTRACTION W/PHACO Right 03/18/2016   Procedure: CATARACT EXTRACTION PHACO AND INTRAOCULAR LENS PLACEMENT ([facility]);  Surgeon: Eulogio Bear, MD;  Location: [facility];  Service: Ophthalmology;  Laterality: Right;  Lot # [id number] H [place]: 01:05.5 AP%:14.3 CDE: 9.33   FLEXIBLE SIGMOIDOSCOPY N/A 01/14/2022   Procedure: FLEXIBLE SIGMOIDOSCOPY;  Surgeon: Toledo, Benay Pike, MD;  Location: [facility];  Service: Gastroenterology;  Laterality: N/A;   FRACTURE SURGERY Left    arm rod and screw   KNEE ARTHROSCOPY     OOPHORECTOMY     RCR     ROTATOR CUFF REPAIR Left 2006    Social History:  reports that she has never smoked. She has never used smokeless tobacco. She reports that she does not drink alcohol and does not use drugs.  Family History:  Family History  Problem Relation Age of Onset   Emphysema Mother    Heart Problems Mother    Tuberculosis Father    Brain cancer Father    Skin cancer Father    Breast cancer Sister    Irritable bowel syndrome Sister    Lung cancer Brother    Dementia Sister    Schizophrenia Sister      Prior to Admission medications   Medication Sig Start Date End Date Taking? Authorizing Provider  acetaminophen (TYLENOL) 500 MG tablet Take 1,000 mg by mouth daily as needed for pain.    [provider]  aspirin EC 81 MG tablet Take 81 mg by mouth daily.    [provider]  calcium carbonate (CALCIUM ANTACID) 500 MG chewable tablet Chew 2 tablets by mouth daily.    [provider]  cetirizine (ZYRTEC) 10 MG tablet Take 10 mg by mouth at  bedtime.     [provider]  Cholecalciferol (VITAMIN D PO) Take 5,000 Units by mouth daily.    [provider]  Cyanocobalamin (VITAMIN B-12 PO) Take 2 tablets by mouth daily.    [provider]  fluticasone (FLONASE) 50 MCG/ACT nasal spray Place 2 sprays into both nostrils daily. 07/26/18   [provider]  furosemide (LASIX) 20 MG tablet Take by mouth.  10/10/17 01/13/22  [provider]  meloxicam (MOBIC) 7.5 MG tablet Take 1 tablet by mouth daily. 10/15/21   [provider]  montelukast (SINGULAIR) 10 MG tablet Take 10 mg by mouth at bedtime.     [provider]  Multiple Vitamins-Minerals (PRESERVISION AREDS PO) Take 1 capsule by mouth 2 (two) times daily.    [provider]  omeprazole (PRILOSEC) 20 MG capsule Take 20 mg by mouth 2 (two) times daily before a meal.  [provider]  ondansetron (ZOFRAN) 4 MG tablet Take 1 tablet (4 mg total) by mouth daily as needed for nausea or vomiting. 01/15/22 01/15/23  Enzo Bi, MD  Polyethyl Glycol-Propyl Glycol (SYSTANE) 0.4-0.3 % SOLN Apply 1 drop to eye 2 (two) times daily.    [provider]  SYMBICORT 160-4.5 MCG/ACT inhaler Inhale 2 puffs into the lungs 2 (two) times daily. 06/19/21   [provider]  VENTOLIN HFA 108 (90 Base) MCG/ACT inhaler Inhale 2 puffs into the lungs as needed. 02/16/19   [provider]    Physical Exam: Vitals:   01/18/22 0919 01/18/22 1401 01/18/22 1404 01/18/22 1500  BP:   118/73 121/78  Pulse:   (!) 108 100  Resp:  18 18 20   Temp:    98.7 F (37.1 C)  TempSrc:      SpO2:   96% 96%  Weight: 77 kg     Height: 4\' 9"  (1.448 m)      General: Not in acute distress HEENT:       Eyes: PERRL, EOMI, no scleral icterus.       ENT: No discharge from the ears and nose, no pharynx injection, no tonsillar enlargement.        Neck: No JVD, no bruit, no mass felt. Heme: No neck lymph node enlargement. Cardiac: S1/S2,  RRR, No murmurs, No gallops or rubs. Respiratory: No rales, wheezing, rhonchi or rubs. GI: distended, mild diffuse tenderness, no rebound pain, no organomegaly, BS present. GU: No hematuria Ext: has trace leg edema bilaterally. 1+DP/PT pulse bilaterally. Musculoskeletal: No joint deformities, No joint redness or warmth, no limitation of ROM in spin. Skin: No rashes.  Neuro: Alert, oriented X3, cranial nerves II-XII grossly intact, moves all extremities normally.  Psych: Patient is not psychotic, no suicidal or hemocidal ideation.  Labs on Admission: I have personally reviewed following labs and imaging studies  CBC: Recent Labs  Lab 01/13/22 1138 01/13/22 1847 01/14/22 0042 01/14/22 0736 01/15/22 0545 01/18/22 0929  WBC 10.0 11.6* 10.8* 10.3  --  4.2  HGB 11.6* 11.1* 10.4* 11.1* 11.0* 11.8*  HCT 35.0* 33.2* 31.0* 32.9*  --  34.6*  MCV 89.3 88.3 89.1 88.4  --  88.7  PLT 166 214 210 218  --  366   Basic Metabolic Panel: Recent Labs  Lab 01/12/22 2051 01/15/22 0545 01/18/22 0929 01/18/22 1120  NA 132* 132* 127*  --   K 4.2 3.7 3.4*  --   CL 99 101 94*  --   CO2 25 24 23   --   GLUCOSE 128* 135* 145*  --   BUN 25* 18 22  --   CREATININE 0.87 0.57 0.84  --   CALCIUM 8.6* 8.4* 8.5*  --   MG  --   --   --  1.9   GFR: Estimated Creatinine Clearance: 43.3 mL/min (by C-G formula based on SCr of 0.84 mg/dL). Liver Function Tests: Recent Labs  Lab 01/12/22 2051 01/18/22 0929  AST 18 29  ALT 15 27  ALKPHOS 102 75  BILITOT 0.6 1.1  PROT 6.1* 5.9*  ALBUMIN 3.9 3.4*   Recent Labs  Lab 01/18/22 0929  LIPASE 45   No results for input(s): "AMMONIA" in the last 168 hours. Coagulation Profile: Recent Labs  Lab 01/13/22 1138  INR 1.0   Cardiac Enzymes: No results for input(s): "CKTOTAL", "CKMB", "CKMBINDEX", "TROPONINI" in the last 168 hours. BNP (last 3 results) No results for input(s): "PROBNP" in the last  8760 hours. HbA1C: No results for input(s): "HGBA1C" in  the last 72 hours. CBG: Recent Labs  Lab 01/14/22 0806 01/15/22 0750  GLUCAP 119* 156*   Lipid Profile: No results for input(s): "CHOL", "HDL", "LDLCALC", "TRIG", "CHOLHDL", "LDLDIRECT" in the last 72 hours. Thyroid Function Tests: No results for input(s): "TSH", "T4TOTAL", "FREET4", "T3FREE", "THYROIDAB" in the last 72 hours. Anemia Panel: No results for input(s): "VITAMINB12", "FOLATE", "FERRITIN", "TIBC", "IRON", "RETICCTPCT" in the last 72 hours. Urine analysis:    Component Value Date/Time   COLORURINE Yellow 07/22/2011 0105   APPEARANCEUR Clear 07/22/2011 0105   LABSPEC 1.016 07/22/2011 0105   PHURINE 5.0 07/22/2011 0105   GLUCOSEU Negative 07/22/2011 0105   HGBUR Negative 07/22/2011 0105   BILIRUBINUR Negative 07/22/2011 0105   KETONESUR Negative 07/22/2011 0105   PROTEINUR Negative 07/22/2011 0105   NITRITE Negative 07/22/2011 0105   LEUKOCYTESUR 3+ 07/22/2011 0105   Sepsis Labs: @LABRCNTIP (procalcitonin:4,lacticidven:4) )No results found for this or any previous visit (from the past 240 hour(s)).   Radiological Exams on Admission: US ABDOMEN LIMITED RUQ (LIVER/GB)  Result Date: 01/18/2022 CLINICAL DATA:  Upper abdominal pain, worse with eating. EXAM: ULTRASOUND ABDOMEN LIMITED RIGHT UPPER QUADRANT COMPARISON:  CT 01/13/2022 FINDINGS: Gallbladder: Small amount of gallbladder sludge. No gallstones. Wall thickness upper limits of normal. No Murphy sign. Common bile duct: Diameter: 2.4 mm.  Normal. Liver: No focal lesion identified. Within normal limits in parenchymal echogenicity. Portal vein is patent on color Doppler imaging with normal direction of blood flow towards the liver. Other: Tiny amount of ascites noted, etiology unknown. IMPRESSION: Small amount of gallbladder sludge. No gallstones. No ductal obstruction. Liver parenchyma is normal. Small amount of ascites. Electronically Signed   By: Nelson Chimes M.D.   On: 01/18/2022 16:02   CT ABDOMEN PELVIS WO  CONTRAST  Result Date: 01/18/2022 CLINICAL DATA:  Nausea, vomiting, recent GI bleed and hospitalization, bloating, increased pain with eating, diarrhea, had colonoscopy during hospitalization, suspected bowel obstruction EXAM: CT ABDOMEN AND PELVIS WITHOUT CONTRAST TECHNIQUE: Multidetector CT imaging of the abdomen and pelvis was performed following the standard protocol without IV contrast. RADIATION DOSE REDUCTION: This exam was performed according to the departmental dose-optimization program which includes automated exposure control, adjustment of the mA and/or kV according to patient size and/or use of iterative reconstruction technique. COMPARISON:  01/13/2022 FINDINGS: Lower chest: Tiny calcified pulmonary granuloma RIGHT lung base. Nodular density LEFT lower lobe 3 mm diameter image 14 unchanged. Additional small branching density posterior RIGHT lower lobe question mucoid impaction. Hepatobiliary: Increased attenuation within gallbladder, likely vicarious excretion of prior contrast. Single tiny high attenuation focus dependently question cholelithiasis. Liver unremarkable. Pancreas: Atrophic pancreas without mass Spleen: Normal appearance Adrenals/Urinary Tract: Adrenal glands, kidneys, ureters, and bladder normal appearance Stomach/Bowel: Appendix not visualized. Sigmoid diverticulosis. Dilated colon until mid sigmoid colon increased since previous study. Abrupt change in colonic caliber is identified at mid sigmoid colon, series 2, image 72. This did not appear mass-like on the recent CT exam and appears more prominent on the current study over a length of approximately 6 cm. Colon distal to this site is decompressed. Dilated small bowel loops new since previous exam. Stomach decompressed. Vascular/Lymphatic: Atherosclerotic calcifications aorta without aneurysm. No adenopathy. Reproductive: Uterus surgically absent with nonvisualization of ovaries Other: Small amount of free fluid in pelvis and at  RIGHT gutter. No free air. No hernia. Musculoskeletal: Osseous demineralization. IMPRESSION: Increased dilatation of the colon until mid sigmoid colon with abrupt change in colonic caliber over a length  of approximately 6 cm, associated with a small amount of free fluid in pelvis and at RIGHT gutter as well as diffuse small bowel dilatation. Findings are most consistent with sigmoid colonic obstruction, question stricture, with no definite mass seen at this site by sigmoidoscopy or prior CT. Sigmoid diverticulosis without evidence of diverticulitis. Small amount of free intraperitoneal fluid without free air. Aortic Atherosclerosis (ICD10-I70.0). Electronically Signed   By: Lavonia Dana M.D.   On: 01/18/2022 15:28      Assessment/Plan Principal Problem:   Abdominal pain Active Problems:   Bowel obstruction_sigmoid colon   Lymphedema   Hypertension   Hypokalemia   LBBB (left bundle branch block)   Asthma   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.0 to 39.9   Hyponatremia   Hypothyroidism   Assessment and Plan: * Abdominal pain Abdominal pain due to sigmoid colon obstruction: Patient does not have vomiting, does not need NG tube now.  ED physician initially consulted general surgeon, Dr. Peyton Najjar who recommended GI consult.  Dr. Haig Prophet then was consulted by GI physician.  -Admitted to telemetry bed as inpatient -As needed morphine for pain -As needed Zofran for nausea -IV fluid: 500 cc normal saline, then 75 cc/h -NPO now  Bowel obstruction_sigmoid colon -see above  Lymphedema -hold lasix   Hypertension -IV hydralazine as needed -  Hypokalemia Potassium is 3.4 -Repleted potassium -Check magnesium level  LBBB (left bundle branch block) Patient has left bundle blockage on EKG, denies any chest pain or shortness breath.  Troponin negative, 7 -Observe closely  Asthma Stable -Bronchodilators  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.0 to 39.9  BMI= 36.73   and BW=  77 -Diet and exercise.   -Encourage to lose weight.   Hyponatremia Sodium 127, most likely due to poor oral intake.  Mental status normal. -Check osmolality of plasma and urine, urine sodium level -Check BMP every 8 hours -500 cc normal saline, 75 cc/h  Hypothyroidism Patient's not taking medications currently -f/u with PCP          DVT ppx: SCD  Code Status: Full code  Family Communication:   Yes, patient's husband    at bed side.   Disposition Plan:  Anticipate discharge back to previous environment  Consults called: Dr. Haig Prophet  Admission status and Level of care: Telemetry Medical:   as inpt       Severity of Illness:  The appropriate patient status for this patient is INPATIENT. Inpatient status is judged to be reasonable and necessary in order to provide the required intensity of service to ensure the patient's safety. The patient's presenting symptoms, physical exam findings, and initial radiographic and laboratory data in the context of their chronic comorbidities is felt to place them at high risk for further clinical deterioration. Furthermore, it is not anticipated that the patient will be medically stable for discharge from the hospital within 2 midnights of admission.   * I certify that at the point of admission it is my clinical judgment that the patient will require inpatient hospital care spanning beyond 2 midnights from the point of admission due to high intensity of service, high risk for further deterioration and high frequency of surveillance required.*       Date of Service 01/18/2022    Ivor Costa [facility]   If 7PM-7AM, please contact night-coverage [email or web address] 01/18/2022, 7:25 PM

## 2022-01-18 NOTE — Assessment & Plan Note (Addendum)
Postoperative day 4.  Pain control.

## 2022-01-18 NOTE — Discharge Instructions (Addendum)
  Diet: Resume home heart healthy regular diet.   Activity: No heavy lifting >20 pounds (children, pets, laundry, garbage) or strenuous activity until follow-up, but light activity and walking are encouraged. Do not drive or drink alcohol if taking narcotic pain medications.  Wound care:  Dressing procedure: Remove old midline dressing Cleanse periwound skin with saline, pat dry Pack distal and proximal openings with silver hydrofiber (aquacel Ag+) or equivalent Top with silicone foam or dry dressings  Chart ileostomy output daily.  Chart drain output separately. The drain on the left of the abdomen is in the pelvis. The drain in the right abdomen is on the right gutter.   Medications:  Take imodium 4 mg every 12 hours. This is a total of 8 mg daily. May need to taper Imodium up or down 2mg  daily to keep an ileostomy output under 1.5 Liters. This can be done increasing the frequency of the imodium or the dose.   Call office [phone]) at any time if any questions, worsening pain, fevers/chills, bleeding, drainage from incision site, or other concerns.

## 2022-01-18 NOTE — Assessment & Plan Note (Signed)
Patient's not taking medications currently -f/u with PCP

## 2022-01-18 NOTE — Assessment & Plan Note (Addendum)
Hold lasix

## 2022-01-19 ENCOUNTER — Inpatient Hospital Stay: Payer: Medicare HMO | Admitting: Certified Registered Nurse Anesthetist

## 2022-01-19 ENCOUNTER — Encounter
Admission: EM | Disposition: A | Discharge status: Skilled Nursing Facility | Payer: Self-pay | Source: Home / Self Care | Attending: Internal Medicine

## 2022-01-19 ENCOUNTER — Encounter: Payer: Self-pay | Admitting: Internal Medicine

## 2022-01-19 DIAGNOSIS — K56699 Other intestinal obstruction unspecified as to partial versus complete obstruction: Principal | ICD-10-CM

## 2022-01-19 HISTORY — PX: FLEXIBLE SIGMOIDOSCOPY: SHX5431

## 2022-01-19 LAB — CBC
HCT: 31.6 % — ABNORMAL LOW (ref 36.0–46.0)
Hemoglobin: 10.8 g/dL — ABNORMAL LOW (ref 12.0–15.0)
MCH: 30 pg (ref 26.0–34.0)
MCHC: 34.2 g/dL (ref 30.0–36.0)
MCV: 87.8 fL (ref 80.0–100.0)
Platelets: 202 10*3/uL (ref 150–400)
RBC: 3.6 MIL/uL — ABNORMAL LOW (ref 3.87–5.11)
RDW: 14.1 % (ref 11.5–15.5)
WBC: 3.8 10*3/uL — ABNORMAL LOW (ref 4.0–10.5)
nRBC: 0 % (ref 0.0–0.2)

## 2022-01-19 LAB — GLUCOSE, CAPILLARY
Glucose-Capillary: 98 mg/dL (ref 70–99)
Glucose-Capillary: 80 mg/dL (ref 70–99)
Glucose-Capillary: 74 mg/dL (ref 70–99)

## 2022-01-19 LAB — BASIC METABOLIC PANEL
Anion gap: 9 (ref 5–15)
BUN: 21 mg/dL (ref 8–23)
CO2: 23 mmol/L (ref 22–32)
Calcium: 8.1 mg/dL — ABNORMAL LOW (ref 8.9–10.3)
Chloride: 99 mmol/L (ref 98–111)
Creatinine, Ser: 0.54 mg/dL (ref 0.44–1.00)
GFR, Estimated: >60 mL/min (ref >60)
Glucose, Bld: 101 mg/dL — ABNORMAL HIGH (ref 70–99)
Potassium: 3.7 mmol/L (ref 3.5–5.1)
Sodium: 131 mmol/L — ABNORMAL LOW (ref 135–145)

## 2022-01-19 SURGERY — SIGMOIDOSCOPY, FLEXIBLE
Anesthesia: General

## 2022-01-19 MED ORDER — PHENYLEPHRINE 80 MCG/ML (10ML) SYRINGE FOR IV PUSH (FOR BLOOD PRESSURE SUPPORT)
PREFILLED_SYRINGE | INTRAVENOUS | Status: DC | PRN
  Administered 2022-01-19 (×2): 160 ug via INTRAVENOUS
  Administered 2022-01-19: 80 ug via INTRAVENOUS

## 2022-01-19 MED ORDER — SODIUM CHLORIDE 0.9 % IV SOLN: INTRAVENOUS | Status: DC

## 2022-01-19 MED ORDER — LIDOCAINE HCL (CARDIAC) PF 100 MG/5ML IV SOSY
PREFILLED_SYRINGE | INTRAVENOUS | Status: DC | PRN
  Administered 2022-01-19: 50 mg via INTRAVENOUS

## 2022-01-19 MED ORDER — PROPOFOL 10 MG/ML IV BOLUS
INTRAVENOUS | Status: DC | PRN
  Administered 2022-01-19: 10 mg via INTRAVENOUS
  Administered 2022-01-19: 60 mg via INTRAVENOUS

## 2022-01-19 MED ORDER — PROPOFOL 500 MG/50ML IV EMUL
INTRAVENOUS | Status: DC | PRN
  Administered 2022-01-19: 100 ug/kg/min via INTRAVENOUS

## 2022-01-19 MED ORDER — SPOT INK MARKER SYRINGE KIT
PACK | SUBMUCOSAL | Status: DC | PRN
  Administered 2022-01-19: 3 mL via SUBMUCOSAL

## 2022-01-19 NOTE — Anesthesia Preprocedure Evaluation (Signed)
Anesthesia Evaluation  Patient identified by MRN, date of birth, ID band Patient awake    Reviewed: Allergy & Precautions, NPO status , Patient's Chart, lab work & pertinent test results  Airway Mallampati: IV  TM Distance: >3 FB Neck ROM: full    Dental  (+) Poor Dentition, Dental Advidsory Given   Pulmonary neg shortness of breath, sleep apnea , COPD,    Pulmonary exam normal        Cardiovascular hypertension, +CHF  Normal cardiovascular exam+ Valvular Problems/Murmurs      Neuro/Psych PSYCHIATRIC DISORDERS negative neurological ROS     GI/Hepatic Neg liver ROS,   Endo/Other  Hypothyroidism   Renal/GU negative Renal ROS  negative genitourinary   Musculoskeletal   Abdominal   Peds  Hematology negative hematology ROS (+)   Anesthesia Other Findings Past Medical History: No date: Anxiety No date: Arthritis No date: Asthma No date: DVT (deep venous thrombosis) (HCC) No date: Edema     Comment:  feet No date: Emphysema of lung (HCC) No date: GERD (gastroesophageal reflux disease) No date: Headache     Comment:  MIGRAINES No date: History of orthopnea No date: Hypercholesterolemia No date: Hypothyroidism     Comment:  NODULES No date: Lymphedema No date: Migraine 05/2019: Non-small cell lung cancer, right ([place])     Comment:  Rad tx's No date: Shortness of breath dyspnea     Comment:  WITH EXERTION No date: Sleep apnea     Comment:  MILD, DOES NOT USE CPAP No date: Thyroid nodule  Past Surgical History: No date: ABDOMINAL HYSTERECTOMY No date: BREAST BIOPSY No date: CARDIAC CATHETERIZATION 02/12/2016: CATARACT EXTRACTION W/PHACO; Left     Comment:  Procedure: CATARACT EXTRACTION PHACO AND INTRAOCULAR               LENS PLACEMENT ([facility]);  Surgeon: Eulogio Bear, MD;                Location: [facility];  Service: Ophthalmology;  Laterality:              Left;  [place] 01:30 AP% 15.2 CDE 13.71 Fluid pack  lot #               [id number] 03/18/2016: CATARACT EXTRACTION W/PHACO; Right     Comment:  Procedure: CATARACT EXTRACTION PHACO AND INTRAOCULAR               LENS PLACEMENT ([facility]);  Surgeon: Eulogio Bear, MD;                Location: [facility];  Service: Ophthalmology;  Laterality:              Right;  Lot # [id number] H [place]: 01:05.5 AP%:14.3 CDE: 9.33 01/14/2022: FLEXIBLE SIGMOIDOSCOPY; N/A     Comment:  Procedure: FLEXIBLE SIGMOIDOSCOPY;  Surgeon: [place],               Benay Pike, MD;  Location: [facility];  Service:               Gastroenterology;  Laterality: N/A; No date: FRACTURE SURGERY; Left     Comment:  arm rod and screw No date: KNEE ARTHROSCOPY No date: OOPHORECTOMY No date: RCR 2006: [place]; Left  BMI    Body Mass Index: 36.73 kg/m      Reproductive/Obstetrics negative OB ROS  Anesthesia Physical Anesthesia Plan  ASA: 3  Anesthesia Plan: General   Post-op Pain Management: Minimal or no pain anticipated   Induction: Intravenous  PONV Risk Score and Plan: 3 and Propofol infusion, TIVA and Ondansetron  Airway Management Planned: Natural Airway and Nasal Cannula  Additional Equipment: None  Intra-op Plan:   Post-operative Plan:   Informed Consent: I have reviewed the patients History and Physical, chart, labs and discussed the procedure including the risks, benefits and alternatives for the proposed anesthesia with the patient or authorized representative who has indicated his/her understanding and acceptance.     Dental Advisory Given  Plan Discussed with: Anesthesiologist, CRNA and Surgeon  Anesthesia Plan Comments: (Patient consented for risks of anesthesia including but not limited to:  - adverse reactions to medications - risk of airway placement if required - damage to eyes, teeth, lips or other oral mucosa - nerve damage due to positioning  - sore throat or hoarseness -  Damage to heart, brain, nerves, lungs, other parts of body or loss of life  Patient voiced understanding.)        Anesthesia Quick Evaluation

## 2022-01-19 NOTE — H&P (View-Only) (Signed)
SURGICAL CONSULTATION NOTE   HISTORY OF PRESENT ILLNESS (HPI):  81 y.o. female presented to [facility] ED for evaluation of feeling bloated. Patient reports she had an episode of lower GI bleeding a week ago that was controlled spontaneously.  She was discharged home and presented again to the ED with abdominal distention.  She endorses some mild pain in the abdomen.  Distention is generalized.  No pain radiation.  Aggravating factor is applying pressure.  There is no alleviating factors.  Patient denies any nausea or vomiting.  Patient endorses passing small amount of gas.  She had a short sigmoidoscopy done today that shows a stricture at the sigmoid colon.  Stricture had a benign appearance.  Stricture was unable to be passed.  Surgery is consulted by Dr. Posey Pronto in this context for evaluation and management of stricture of the sigmoid colon.  PAST MEDICAL HISTORY (PMH):  Past Medical History:  Diagnosis Date   Anxiety    Arthritis    Asthma    DVT (deep venous thrombosis) (HCC)    Edema    feet   Emphysema of lung (HCC)    GERD (gastroesophageal reflux disease)    Headache    MIGRAINES   History of orthopnea    Hypercholesterolemia    Hypothyroidism    NODULES   Lymphedema    Migraine    Non-small cell lung cancer, right ([place]) 05/2019   Rad tx's   Shortness of breath dyspnea    WITH EXERTION   Sleep apnea    MILD, DOES NOT USE CPAP   Thyroid nodule      PAST SURGICAL HISTORY ([place]):  Past Surgical History:  Procedure Laterality Date   ABDOMINAL HYSTERECTOMY     BREAST BIOPSY     CARDIAC CATHETERIZATION     CATARACT EXTRACTION W/PHACO Left 02/12/2016   Procedure: CATARACT EXTRACTION PHACO AND INTRAOCULAR LENS PLACEMENT ([place]);  Surgeon: Eulogio Bear, MD;  Location: [facility] ORS;  Service: Ophthalmology;  Laterality: Left;  [place] 01:30 AP% 15.2 CDE 13.71 Fluid pack lot # [id number]   CATARACT EXTRACTION W/PHACO Right 03/18/2016   Procedure: CATARACT EXTRACTION PHACO AND  INTRAOCULAR LENS PLACEMENT (I[facility]);  Surgeon: Eulogio Bear, MD;  Location: [facility] ORS;  Service: Ophthalmology;  Laterality: Right;  Lot # [id number] [place]: 01:05.5 AP%:14.3 CDE: 9.33   FLEXIBLE SIGMOIDOSCOPY N/A 01/14/2022   Procedure: FLEXIBLE SIGMOIDOSCOPY;  Surgeon: Tol[place], Benay Pike, MD;  Location: [facility] ENDOSCOPY;  Service: Gastroenterology;  Laterality: N/A;   FRACTURE SURGERY Left    arm rod and screw   KNEE ARTHROSCOPY     OOPHORECTOMY     RCR     ROTATOR CUFF REPAIR Left 2006     MEDICATIONS:  Prior to Admission medications   Medication Sig Start Date End Date Taking? Authorizing Provider  acetaminophen (TYLENOL) 500 MG tablet Take 1,000 mg by mouth daily as needed for pain.   Yes [provider]  aspirin EC 81 MG tablet Take 81 mg by mouth daily.   Yes [provider]  calcium carbonate (CALCIUM ANTACID) 500 MG chewable tablet Chew 2 tablets by mouth daily.   Yes [provider]  Cholecalciferol (VITAMIN D PO) Take 5,000 Units by mouth daily.   Yes [provider]  Cyanocobalamin (VITAMIN B-12 PO) Take 2 tablets by mouth daily.   Yes [provider]  fluticasone (FLONASE) 50 MCG/ACT nasal spray Place 2 sprays into both nostrils daily. 07/26/18  Yes [provider]  meloxicam (MOBIC) 7.5 MG  tablet Take 1 tablet by mouth daily. 10/15/21  Yes [provider]  montelukast (SINGULAIR) 10 MG tablet Take 10 mg by mouth at bedtime.    Yes [provider]  Multiple Vitamins-Minerals (PRESERVISION AREDS PO) Take 1 capsule by mouth 2 (two) times daily.   Yes [provider]  omeprazole (PRILOSEC) 20 MG capsule Take 20 mg by mouth 2 (two) times daily before a meal.   Yes [provider]  ondansetron (ZOFRAN) 4 MG tablet Take 1 tablet (4 mg total) by mouth daily as needed for nausea or vomiting. 01/15/22 01/15/23 Yes Enzo Bi, MD  Polyethyl Glycol-Propyl Glycol (SYSTANE) 0.4-0.3 % SOLN Apply 1 drop to eye 2  (two) times daily.   Yes [provider]  SYMBICORT 160-4.5 MCG/ACT inhaler Inhale 2 puffs into the lungs 2 (two) times daily. 06/19/21  Yes [provider]  furosemide (LASIX) 20 MG tablet Take 20 mg by mouth daily. 10/10/17   [provider]     ALLERGIES:  Allergies  Allergen Reactions   Eryc [Erythromycin]    Levofloxacin Nausea Only   Percocet [Oxycodone-Acetaminophen] Nausea And Vomiting   Augmentin [Amoxicillin-Pot Clavulanate] Rash   Celecoxib Palpitations     SOCIAL HISTORY:  Social History   Socioeconomic History   Marital status: Married    Spouse name: Not on file   Number of children: Not on file   Years of education: Not on file   Highest education level: Not on file  Occupational History   Not on file  Tobacco Use   Smoking status: Never   Smokeless tobacco: Never  Substance and Sexual Activity   Alcohol use: No   Drug use: Never   Sexual activity: Not on file  Other Topics Concern   Not on file  Social History Narrative   Not on file   Social Determinants of Health   Financial Resource Strain: Not on file  Food Insecurity: Not on file  Transportation Needs: Not on file  Physical Activity: Not on file  Stress: Not on file  Social Connections: Not on file  Intimate Partner Violence: Not on file      FAMILY HISTORY:  Family History  Problem Relation Age of Onset   Emphysema Mother    Heart Problems Mother    Tuberculosis Father    Brain cancer Father    Skin cancer Father    Breast cancer Sister    Irritable bowel syndrome Sister    Lung cancer Brother    Dementia Sister    Schizophrenia Sister      REVIEW OF SYSTEMS:  Constitutional: denies weight loss, fever, chills, or sweats  Eyes: denies any other vision changes, history of eye injury  ENT: denies sore throat, hearing problems  Respiratory: denies shortness of breath, wheezing  Cardiovascular: denies chest pain, palpitations  Gastrointestinal: Positive  for abdominal pain and abdominal pain Genitourinary: denies burning with urination or urinary frequency Musculoskeletal: denies any other joint pains or cramps  Skin: denies any other rashes or skin discolorations  Neurological: denies any other headache, dizziness, weakness  Psychiatric: denies any other depression, anxiety   All other review of systems were negative   VITAL SIGNS:  Temp:  [97.2 F (36.2 C)-98.7 F (37.1 C)] 98.5 F (36.9 C) (08/15 1612) Pulse Rate:  [86-103] 98 (08/15 1612) Resp:  [17-20] 17 (08/15 1612) BP: (102-132)/(42-82) 121/65 (08/15 1612) SpO2:  [86 %-100 %] 97 % (08/15 1612)     Height: 4\' 9"  (144.8  cm) Weight: 77 kg BMI (Calculated): 36.72   INTAKE/OUTPUT:  This shift: Total I/O In: 300 [I.V.:300] Out: -   Last 2 shifts: @IOLAST2SHIFTS @   PHYSICAL EXAM:  Constitutional:  -- Normal body habitus  -- Awake, alert, and oriented x3  Eyes:  -- Pupils equally round and reactive to light  -- No scleral icterus  Ear, nose, and throat:  -- No jugular venous distension  Pulmonary:  -- No crackles  -- Equal breath sounds bilaterally -- Breathing non-labored at rest Cardiovascular:  -- S1, S2 present  -- No pericardial rubs Gastrointestinal:  -- Abdomen soft, mild tender, distended, no guarding or rebound tenderness -- No abdominal masses appreciated, pulsatile or otherwise  Musculoskeletal and Integumentary:  -- Wounds: None appreciated -- Extremities: B/L UE and LE FROM, hands and feet warm, no edema  Neurologic:  -- Motor function: intact and symmetric -- Sensation: intact and symmetric   Labs:     Latest Ref Rng & Units 01/19/2022    2:12 AM 01/18/2022    9:29 AM 01/15/2022    5:45 AM  CBC  WBC 4.0 - 10.5 K/uL 3.8  4.2    Hemoglobin 12.0 - 15.0 g/dL 10.8  11.8  11.0   Hematocrit 36.0 - 46.0 % 31.6  34.6    Platelets 150 - 400 K/uL 202  246        Latest Ref Rng & Units 01/19/2022    2:12 AM 01/18/2022    9:00 PM 01/18/2022    9:29 AM   CMP  Glucose 70 - 99 mg/dL 101  96  145   BUN 8 - 23 mg/dL 21  22  22    Creatinine 0.44 - 1.00 mg/dL 0.54  0.58  0.84   Sodium 135 - 145 mmol/L 131  129  127   Potassium 3.5 - 5.1 mmol/L 3.7  3.2  3.4   Chloride 98 - 111 mmol/L 99  96  94   CO2 22 - 32 mmol/L 23  21  23    Calcium 8.9 - 10.3 mg/dL 8.1  8.2  8.5   Total Protein 6.5 - 8.1 g/dL   5.9   Total Bilirubin 0.3 - 1.2 mg/dL   1.1   Alkaline Phos 38 - 126 U/L   75   AST 15 - 41 U/L   29   ALT 0 - 44 U/L   27     Imaging studies:  EXAM: CT ABDOMEN AND PELVIS WITHOUT CONTRAST   TECHNIQUE: Multidetector CT imaging of the abdomen and pelvis was performed following the standard protocol without IV contrast.   RADIATION DOSE REDUCTION: This exam was performed according to the departmental dose-optimization program which includes automated exposure control, adjustment of the mA and/or kV according to patient size and/or use of iterative reconstruction technique.   COMPARISON:  01/13/2022   FINDINGS: Lower chest: Tiny calcified pulmonary granuloma RIGHT lung base. Nodular density LEFT lower lobe 3 mm diameter image 14 unchanged. Additional small branching density posterior RIGHT lower lobe question mucoid impaction.   Hepatobiliary: Increased attenuation within gallbladder, likely vicarious excretion of prior contrast. Single tiny high attenuation focus dependently question cholelithiasis. Liver unremarkable.   Pancreas: Atrophic pancreas without mass   Spleen: Normal appearance   Adrenals/Urinary Tract: Adrenal glands, kidneys, ureters, and bladder normal appearance   Stomach/Bowel: Appendix not visualized. Sigmoid diverticulosis. Dilated colon until mid sigmoid colon increased since previous study. Abrupt change in colonic caliber is identified at mid sigmoid colon, series 2, image  72. This did not appear mass-like on the recent CT exam and appears more prominent on the current study over a length of  approximately 6 cm. Colon distal to this site is decompressed. Dilated small bowel loops new since previous exam. Stomach decompressed.   Vascular/Lymphatic: Atherosclerotic calcifications aorta without aneurysm. No adenopathy.   Reproductive: Uterus surgically absent with nonvisualization of ovaries   Other: Small amount of free fluid in pelvis and at RIGHT gutter. No free air. No hernia.   Musculoskeletal: Osseous demineralization.   IMPRESSION: Increased dilatation of the colon until mid sigmoid colon with abrupt change in colonic caliber over a length of approximately 6 cm, associated with a small amount of free fluid in pelvis and at RIGHT gutter as well as diffuse small bowel dilatation.   Findings are most consistent with sigmoid colonic obstruction, question stricture, with no definite mass seen at this site by sigmoidoscopy or prior CT.   Sigmoid diverticulosis without evidence of diverticulitis.   Small amount of free intraperitoneal fluid without free air.   Aortic Atherosclerosis (ICD10-I70.0).     Electronically Signed   By: Lavonia Dana M.D.   On: 01/18/2022 15:28  Assessment/Plan:  81 y.o. female with sigmoid stricture with obstruction, complicated by pertinent comorbidities including hypertension, hyperlipidemia, asthma, GERD, hypothyroidism.  Patient has a sigmoid stricture with almost complete obstruction.  She is passing small amount of gas.  It is not enough and is causing proximal dilation and small bowel dilation as well.  The stricture was unable to pass with a small endoscope today.  Stricture has a benign appearance.  I discussed with patient recommendation of having a partial colectomy of the sigmoid colon with possible end colostomy.  I discussed with patient the surgical intervention including the benefits and the risk.  Risks discussed were infection, bleeding, injury to adjacent organs such as kidney, intestine, ureter, bladder, among others.  Also  discussed about intra-abdominal infections leak of anastomosis if performed, ischemia of the intestine, among others.  The patient reports she understood and agreed to proceed with partial colectomy.  Arnold Long, MD

## 2022-01-19 NOTE — Op Note (Addendum)
[facility] Gastroenterology Patient Name: Lindsey Thomas Procedure Date: 01/19/2022 11:30 AM MRN: [id number] Account #: [id number] Date of Birth: 1940/06/29 Admit Type: Inpatient Age: 81 Room: [facility] ENDO ROOM 1 Gender: Female Note Status: Supervisor Override Instrument Name: Peds Colonoscope [id number] Procedure:             Flexible Sigmoidoscopy Indications:           Abnormal CT of the GI tract Providers:             Andrey Farmer MD, MD Referring MD:          Leonie Douglas. Doy Hutching, MD (Referring MD) Medicines:             Monitored Anesthesia Care Complications:         No immediate complications. Procedure:             Pre-Anesthesia Assessment:                        - Prior to the procedure, a History and Physical was                         performed, and patient medications and allergies were                         reviewed. The patient is competent. The risks and                         benefits of the procedure and the sedation options and                         risks were discussed with the patient. All questions                         were answered and informed consent was obtained.                         Patient identification and proposed procedure were                         verified by the physician, the nurse, the                         anesthesiologist, the anesthetist and the technician                         in the endoscopy suite. Mental Status Examination:                         alert and oriented. Airway Examination: normal                         oropharyngeal airway and neck mobility. Respiratory                         Examination: clear to auscultation. CV Examination:                         normal. Prophylactic Antibiotics: The patient does not  require prophylactic antibiotics. Prior                         Anticoagulants: The patient has taken no previous                         anticoagulant or  antiplatelet agents. ASA Grade                         Assessment: III - A patient with severe systemic                         disease. After reviewing the risks and benefits, the                         patient was deemed in satisfactory condition to                         undergo the procedure. The anesthesia plan was to use                         monitored anesthesia care (MAC). Immediately prior to                         administration of medications, the patient was                         re-assessed for adequacy to receive sedatives. The                         heart rate, respiratory rate, oxygen saturations,                         blood pressure, adequacy of pulmonary ventilation, and                         response to care were monitored throughout the                         procedure. The physical status of the patient was                         re-assessed after the procedure.                        After obtaining informed consent, the scope was passed                         under direct vision. The Colonoscope was introduced                         through the anus and advanced to the the descending                         colon. The flexible sigmoidoscopy was technically                         difficult and complex due to bowel stenosis and poor  bowel prep. The quality of the bowel preparation was                         poor. Findings:      The perianal and digital rectal examinations were normal.      A benign-appearing, intrinsic severe stenosis was found in the       descending colon and was non-traversed. Not able to pass with EGD scope.       This was about 55 cm from anal verge. Area was tattooed with an       injection of [place] ink.      Multiple small-mouthed diverticula were found in the sigmoid colon and       descending colon. Impression:            - Preparation of the colon was poor.                        - Stricture in the  descending colon. Tattooed.                        - Diverticulosis in the sigmoid colon and in the                         descending colon.                        - No specimens collected. Recommendation:        - Will discuss with surgeon about next steps.                        - Return patient to hospital ward for ongoing care. Procedure Code(s):     --- Professional ---                        [phone], Sigmoidoscopy, flexible; with directed                         submucosal injection(s), any substance Diagnosis Code(s):     --- Professional ---                        [id number], Other intestinal obstruction unspecified as                         to partial versus complete obstruction                        K57.30, Diverticulosis of large intestine without                         perforation or abscess without bleeding                        R93.3, Abnormal findings on diagnostic imaging of                         other parts of digestive tract CPT copyright 2019 [employer]. All rights reserved. The codes documented in this report are preliminary and upon coder review may  be revised to meet current compliance requirements. Andrey Farmer MD, MD 01/19/2022 12:03:11  PM Number of Addenda: 0 Note Initiated On: 01/19/2022 11:30 AM Total Procedure Duration: 0 hours 13 minutes 32 seconds  Estimated Blood Loss:  Estimated blood loss: none.      [facility]

## 2022-01-19 NOTE — Transfer of Care (Signed)
Immediate Anesthesia Transfer of Care Note  Patient: Lindsey Thomas  Procedure(s) Performed: FLEXIBLE SIGMOIDOSCOPY  Patient Location: Endoscopy Unit  Anesthesia Type:General  Level of Consciousness: awake, alert  and oriented  Airway & Oxygen Therapy: Patient Spontanous Breathing  Post-op Assessment: Report given to RN and Post -op Vital signs reviewed and stable  Post vital signs: Reviewed and stable  Last Vitals:  Vitals Value Taken Time  BP 102/42 01/19/22 1200  Temp    Pulse 63 01/19/22 1201  Resp 20 01/19/22 1201  SpO2 90 % 01/19/22 1201  Vitals shown include unvalidated device data.  Last Pain:  Vitals:   01/19/22 1032  TempSrc: Temporal  PainSc: 0-No pain         Complications: No notable events documented.

## 2022-01-19 NOTE — Anesthesia Procedure Notes (Signed)
Procedure Name: MAC Date/Time: 01/19/2022 11:25 AM  Performed by: Tollie Eth, CRNAPre-anesthesia Checklist: Patient identified, Emergency Drugs available, Suction available and Patient being monitored Patient Re-evaluated:Patient Re-evaluated prior to induction Oxygen Delivery Method: Nasal cannula Induction Type: IV induction Placement Confirmation: positive ETCO2

## 2022-01-19 NOTE — Consult Note (Signed)
SURGICAL CONSULTATION NOTE   HISTORY OF PRESENT ILLNESS (HPI):  81 y.o. female presented to [facility] ED for evaluation of feeling bloated. Patient reports she had an episode of lower GI bleeding a week ago that was controlled spontaneously.  She was discharged home and presented again to the ED with abdominal distention.  She endorses some mild pain in the abdomen.  Distention is generalized.  No pain radiation.  Aggravating factor is applying pressure.  There is no alleviating factors.  Patient denies any nausea or vomiting.  Patient endorses passing small amount of gas.  She had a short sigmoidoscopy done today that shows a stricture at the sigmoid colon.  Stricture had a benign appearance.  Stricture was unable to be passed.  Surgery is consulted by Dr. Posey Pronto in this context for evaluation and management of stricture of the sigmoid colon.  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   Rad tx's   Shortness of breath dyspnea    WITH EXERTION   Sleep apnea    MILD, DOES NOT USE CPAP   Thyroid nodule      PAST SURGICAL HISTORY ([place]):  Past Surgical History:  Procedure Laterality Date   ABDOMINAL HYSTERECTOMY     BREAST BIOPSY     CARDIAC CATHETERIZATION     CATARACT EXTRACTION W/PHACO Left 02/12/2016   Procedure: CATARACT EXTRACTION PHACO AND INTRAOCULAR LENS PLACEMENT ([place]);  Surgeon: Eulogio Bear, MD;  Location: [facility] ORS;  Service: Ophthalmology;  Laterality: Left;  [place] 01:30 AP% 15.2 CDE 13.71 Fluid pack lot # [id number]   CATARACT EXTRACTION W/PHACO Right 03/18/2016   Procedure: CATARACT EXTRACTION PHACO AND  INTRAOCULAR LENS PLACEMENT (I[facility]);  Surgeon: Eulogio Bear, MD;  Location: [facility] ORS;  Service: Ophthalmology;  Laterality: Right;  Lot # [id number] H [place]: 01:05.5 AP%:14.3 CDE: 9.33   FLEXIBLE SIGMOIDOSCOPY N/A 01/14/2022   Procedure: FLEXIBLE SIGMOIDOSCOPY;  Surgeon: Toledo, Benay Pike, MD;  Location: [facility] ENDOSCOPY;  Service: Gastroenterology;  Laterality: N/A;   FRACTURE SURGERY Left    arm rod and screw   KNEE ARTHROSCOPY     OOPHORECTOMY     RCR     ROTATOR CUFF REPAIR Left 2006     MEDICATIONS:  Prior to Admission medications   Medication Sig Start Date End Date Taking? Authorizing Provider  acetaminophen (TYLENOL) 500 MG tablet Take 1,000 mg by mouth daily as needed for pain.   Yes [provider]  aspirin EC 81 MG tablet Take 81 mg by mouth daily.   Yes [provider]  calcium carbonate (CALCIUM ANTACID) 500 MG chewable tablet Chew 2 tablets by mouth daily.   Yes [provider]  Cholecalciferol (VITAMIN D PO) Take 5,000 Units by mouth daily.   Yes [provider]  Cyanocobalamin (VITAMIN B-12 PO) Take 2 tablets by mouth daily.   Yes [provider]  fluticasone (FLONASE) 50 MCG/ACT nasal spray Place 2 sprays into both nostrils daily. 07/26/18  Yes [provider]  meloxicam (MOBIC) 7.5 MG  tablet Take 1 tablet by mouth daily. 10/15/21  Yes [provider]  montelukast (SINGULAIR) 10 MG tablet Take 10 mg by mouth at bedtime.    Yes [provider]  Multiple Vitamins-Minerals (PRESERVISION AREDS PO) Take 1 capsule by mouth 2 (two) times daily.   Yes [provider]  omeprazole (PRILOSEC) 20 MG capsule Take 20 mg by mouth 2 (two) times daily before a meal.   Yes [provider]  ondansetron (ZOFRAN) 4 MG tablet Take 1 tablet (4 mg total) by mouth daily as needed for nausea or vomiting. 01/15/22 01/15/23 Yes Enzo Bi, MD  Polyethyl Glycol-Propyl Glycol (SYSTANE) 0.4-0.3 % SOLN Apply 1 drop to eye 2  (two) times daily.   Yes [provider]  SYMBICORT 160-4.5 MCG/ACT inhaler Inhale 2 puffs into the lungs 2 (two) times daily. 06/19/21  Yes [provider]  furosemide (LASIX) 20 MG tablet Take 20 mg by mouth daily. 10/10/17   [provider]     ALLERGIES:  Allergies  Allergen Reactions   Eryc [Erythromycin]    Levofloxacin Nausea Only   Percocet [Oxycodone-Acetaminophen] Nausea And Vomiting   Augmentin [Amoxicillin-Pot Clavulanate] Rash   Celecoxib Palpitations     SOCIAL HISTORY:  Social History   Socioeconomic History   Marital status: Married    Spouse name: Not on file   Number of children: Not on file   Years of education: Not on file   Highest education level: Not on file  Occupational History   Not on file  Tobacco Use   Smoking status: Never   Smokeless tobacco: Never  Substance and Sexual Activity   Alcohol use: No   Drug use: Never   Sexual activity: Not on file  Other Topics Concern   Not on file  Social History Narrative   Not on file   Social Determinants of Health   Financial Resource Strain: Not on file  Food Insecurity: Not on file  Transportation Needs: Not on file  Physical Activity: Not on file  Stress: Not on file  Social Connections: Not on file  Intimate Partner Violence: Not on file      FAMILY HISTORY:  Family History  Problem Relation Age of Onset   Emphysema Mother    Heart Problems Mother    Tuberculosis Father    Brain cancer Father    Skin cancer Father    Breast cancer Sister    Irritable bowel syndrome Sister    Lung cancer Brother    Dementia Sister    Schizophrenia Sister      REVIEW OF SYSTEMS:  Constitutional: denies weight loss, fever, chills, or sweats  Eyes: denies any other vision changes, history of eye injury  ENT: denies sore throat, hearing problems  Respiratory: denies shortness of breath, wheezing  Cardiovascular: denies chest pain, palpitations  Gastrointestinal: Positive  for abdominal pain and abdominal pain Genitourinary: denies burning with urination or urinary frequency Musculoskeletal: denies any other joint pains or cramps  Skin: denies any other rashes or skin discolorations  Neurological: denies any other headache, dizziness, weakness  Psychiatric: denies any other depression, anxiety   All other review of systems were negative   VITAL SIGNS:  Temp:  [97.2 F (36.2 C)-98.7 F (37.1 C)] 98.5 F (36.9 C) (08/15 1612) Pulse Rate:  [86-103] 98 (08/15 1612) Resp:  [17-20] 17 (08/15 1612) BP: (102-132)/(42-82) 121/65 (08/15 1612) SpO2:  [86 %-100 %] 97 % (08/15 1612)     Height: 4\' 9"  (144.8  cm) Weight: 77 kg BMI (Calculated): 36.72   INTAKE/OUTPUT:  This shift: Total I/O In: 300 [I.V.:300] Out: -   Last 2 shifts: @IOLAST2SHIFTS @   PHYSICAL EXAM:  Constitutional:  -- Normal body habitus  -- Awake, alert, and oriented x3  Eyes:  -- Pupils equally round and reactive to light  -- No scleral icterus  Ear, nose, and throat:  -- No jugular venous distension  Pulmonary:  -- No crackles  -- Equal breath sounds bilaterally -- Breathing non-labored at rest Cardiovascular:  -- S1, S2 present  -- No pericardial rubs Gastrointestinal:  -- Abdomen soft, mild tender, distended, no guarding or rebound tenderness -- No abdominal masses appreciated, pulsatile or otherwise  Musculoskeletal and Integumentary:  -- Wounds: None appreciated -- Extremities: B/L UE and LE FROM, hands and feet warm, no edema  Neurologic:  -- Motor function: intact and symmetric -- Sensation: intact and symmetric   Labs:     Latest Ref Rng & Units 01/19/2022    2:12 AM 01/18/2022    9:29 AM 01/15/2022    5:45 AM  CBC  WBC 4.0 - 10.5 K/uL 3.8  4.2    Hemoglobin 12.0 - 15.0 g/dL 10.8  11.8  11.0   Hematocrit 36.0 - 46.0 % 31.6  34.6    Platelets 150 - 400 K/uL 202  246        Latest Ref Rng & Units 01/19/2022    2:12 AM 01/18/2022    9:00 PM 01/18/2022    9:29 AM   CMP  Glucose 70 - 99 mg/dL 101  96  145   BUN 8 - 23 mg/dL 21  22  22    Creatinine 0.44 - 1.00 mg/dL 0.54  0.58  0.84   Sodium 135 - 145 mmol/L 131  129  127   Potassium 3.5 - 5.1 mmol/L 3.7  3.2  3.4   Chloride 98 - 111 mmol/L 99  96  94   CO2 22 - 32 mmol/L 23  21  23    Calcium 8.9 - 10.3 mg/dL 8.1  8.2  8.5   Total Protein 6.5 - 8.1 g/dL   5.9   Total Bilirubin 0.3 - 1.2 mg/dL   1.1   Alkaline Phos 38 - 126 U/L   75   AST 15 - 41 U/L   29   ALT 0 - 44 U/L   27     Imaging studies:  EXAM: CT ABDOMEN AND PELVIS WITHOUT CONTRAST   TECHNIQUE: Multidetector CT imaging of the abdomen and pelvis was performed following the standard protocol without IV contrast.   RADIATION DOSE REDUCTION: This exam was performed according to the departmental dose-optimization program which includes automated exposure control, adjustment of the mA and/or kV according to patient size and/or use of iterative reconstruction technique.   COMPARISON:  01/13/2022   FINDINGS: Lower chest: Tiny calcified pulmonary granuloma RIGHT lung base. Nodular density LEFT lower lobe 3 mm diameter image 14 unchanged. Additional small branching density posterior RIGHT lower lobe question mucoid impaction.   Hepatobiliary: Increased attenuation within gallbladder, likely vicarious excretion of prior contrast. Single tiny high attenuation focus dependently question cholelithiasis. Liver unremarkable.   Pancreas: Atrophic pancreas without mass   Spleen: Normal appearance   Adrenals/Urinary Tract: Adrenal glands, kidneys, ureters, and bladder normal appearance   Stomach/Bowel: Appendix not visualized. Sigmoid diverticulosis. Dilated colon until mid sigmoid colon increased since previous study. Abrupt change in colonic caliber is identified at mid sigmoid colon, series 2, image  72. This did not appear mass-like on the recent CT exam and appears more prominent on the current study over a length of  approximately 6 cm. Colon distal to this site is decompressed. Dilated small bowel loops new since previous exam. Stomach decompressed.   Vascular/Lymphatic: Atherosclerotic calcifications aorta without aneurysm. No adenopathy.   Reproductive: Uterus surgically absent with nonvisualization of ovaries   Other: Small amount of free fluid in pelvis and at RIGHT gutter. No free air. No hernia.   Musculoskeletal: Osseous demineralization.   IMPRESSION: Increased dilatation of the colon until mid sigmoid colon with abrupt change in colonic caliber over a length of approximately 6 cm, associated with a small amount of free fluid in pelvis and at RIGHT gutter as well as diffuse small bowel dilatation.   Findings are most consistent with sigmoid colonic obstruction, question stricture, with no definite mass seen at this site by sigmoidoscopy or prior CT.   Sigmoid diverticulosis without evidence of diverticulitis.   Small amount of free intraperitoneal fluid without free air.   Aortic Atherosclerosis (ICD10-I70.0).     Electronically Signed   By: Lavonia Dana M.D.   On: 01/18/2022 15:28  Assessment/Plan:  81 y.o. female with sigmoid stricture with obstruction, complicated by pertinent comorbidities including hypertension, hyperlipidemia, asthma, GERD, hypothyroidism.  Patient has a sigmoid stricture with almost complete obstruction.  She is passing small amount of gas.  It is not enough and is causing proximal dilation and small bowel dilation as well.  The stricture was unable to pass with a small endoscope today.  Stricture has a benign appearance.  I discussed with patient recommendation of having a partial colectomy of the sigmoid colon with possible end colostomy.  I discussed with patient the surgical intervention including the benefits and the risk.  Risks discussed were infection, bleeding, injury to adjacent organs such as kidney, intestine, ureter, bladder, among others.  Also  discussed about intra-abdominal infections leak of anastomosis if performed, ischemia of the intestine, among others.  The patient reports she understood and agreed to proceed with partial colectomy.  Arnold Long, MD

## 2022-01-19 NOTE — Progress Notes (Signed)
[place] [facility] [place] NAME: Lindsey Thomas    MR#:  [id number]  DATE OF BIRTH:  05-Nov-1940  SUBJECTIVE:  patient was admitted recently with rectal bleed few days ago went home came in with abdominal distention, bloating and diarrhea and stools. She has not had formal she stool in many days. Denies any abdominal pain. She felt better after given enema for flex sigmoidoscopy prep. Patient denies any vomiting. Had some nausea.  Family at bedside VITALS:  Blood pressure (!) 116/56, pulse 89, temperature (!) 97.2 F (36.2 C), temperature source Temporal, resp. rate 17, height 4\' 9"  (1.448 m), weight 77 kg, SpO2 96 %.  PHYSICAL EXAMINATION:   GENERAL:  81 y.o.-year-old patient lying in the bed with no acute distress. obesity LUNGS: Normal breath sounds bilaterally, no wheezing, rales, rhonchi.  CARDIOVASCULAR: S1, S2 normal. No murmurs, rubs, or gallops.  ABDOMEN: Soft, nontender, +distended.  EXTREMITIES: No  edema b/l.    NEUROLOGIC: nonfocal  patient is alert and awake SKIN: No obvious rash, lesion, or ulcer.   LABORATORY PANEL:  CBC Recent Labs  Lab 01/19/22 0212  WBC 3.8*  HGB 10.8*  HCT 31.6*  PLT 202    Chemistries  Recent Labs  Lab 01/18/22 0929 01/18/22 1120 01/18/22 2100 01/19/22 0212  NA 127*  --    < > 131*  K 3.4*  --    < > 3.7  CL 94*  --    < > 99  CO2 23  --    < > 23  GLUCOSE 145*  --    < > 101*  BUN 22  --    < > 21  CREATININE 0.84  --    < > 0.54  CALCIUM 8.5*  --    < > 8.1*  MG  --  1.9  --   --   AST 29  --   --   --   ALT 27  --   --   --   ALKPHOS 75  --   --   --   BILITOT 1.1  --   --   --    < > = values in this interval not displayed.   Cardiac Enzymes No results for input(s): "TROPONINI" in the last 168 hours. RADIOLOGY:  US ABDOMEN LIMITED RUQ (LIVER/GB)  Result Date: 01/18/2022 CLINICAL DATA:  Upper abdominal pain, worse with eating. EXAM: ULTRASOUND ABDOMEN LIMITED RIGHT UPPER  QUADRANT COMPARISON:  CT 01/13/2022 FINDINGS: Gallbladder: Small amount of gallbladder sludge. No gallstones. Wall thickness upper limits of normal. No Murphy sign. Common bile duct: Diameter: 2.4 mm.  Normal. Liver: No focal lesion identified. Within normal limits in parenchymal echogenicity. Portal vein is patent on color Doppler imaging with normal direction of blood flow towards the liver. Other: Tiny amount of ascites noted, etiology unknown. IMPRESSION: Small amount of gallbladder sludge. No gallstones. No ductal obstruction. Liver parenchyma is normal. Small amount of ascites. Electronically Signed   By: Nelson Chimes M.D.   On: 01/18/2022 16:02   CT ABDOMEN PELVIS WO CONTRAST  Result Date: 01/18/2022 CLINICAL DATA:  Nausea, vomiting, recent GI bleed and hospitalization, bloating, increased pain with eating, diarrhea, had colonoscopy during hospitalization, suspected bowel obstruction EXAM: CT ABDOMEN AND PELVIS WITHOUT CONTRAST TECHNIQUE: Multidetector CT imaging of the abdomen and pelvis was performed following the standard protocol without IV contrast. RADIATION DOSE REDUCTION: This exam was performed according to the departmental dose-optimization program which includes  automated exposure control, adjustment of the mA and/or kV according to patient size and/or use of iterative reconstruction technique. COMPARISON:  01/13/2022 FINDINGS: Lower chest: Tiny calcified pulmonary granuloma RIGHT lung base. Nodular density LEFT lower lobe 3 mm diameter image 14 unchanged. Additional small branching density posterior RIGHT lower lobe question mucoid impaction. Hepatobiliary: Increased attenuation within gallbladder, likely vicarious excretion of prior contrast. Single tiny high attenuation focus dependently question cholelithiasis. Liver unremarkable. Pancreas: Atrophic pancreas without mass Spleen: Normal appearance Adrenals/Urinary Tract: Adrenal glands, kidneys, ureters, and bladder normal appearance  Stomach/Bowel: Appendix not visualized. Sigmoid diverticulosis. Dilated colon until mid sigmoid colon increased since previous study. Abrupt change in colonic caliber is identified at mid sigmoid colon, series 2, image 72. This did not appear mass-like on the recent CT exam and appears more prominent on the current study over a length of approximately 6 cm. Colon distal to this site is decompressed. Dilated small bowel loops new since previous exam. Stomach decompressed. Vascular/Lymphatic: Atherosclerotic calcifications aorta without aneurysm. No adenopathy. Reproductive: Uterus surgically absent with nonvisualization of ovaries Other: Small amount of free fluid in pelvis and at RIGHT gutter. No free air. No hernia. Musculoskeletal: Osseous demineralization. IMPRESSION: Increased dilatation of the colon until mid sigmoid colon with abrupt change in colonic caliber over a length of approximately 6 cm, associated with a small amount of free fluid in pelvis and at RIGHT gutter as well as diffuse small bowel dilatation. Findings are most consistent with sigmoid colonic obstruction, question stricture, with no definite mass seen at this site by sigmoidoscopy or prior CT. Sigmoid diverticulosis without evidence of diverticulitis. Small amount of free intraperitoneal fluid without free air. Aortic Atherosclerosis (ICD10-I70.0). Electronically Signed   By: Lavonia Dana M.D.   On: 01/18/2022 15:28    Assessment and Plan  Lindsey Thomas is a 81 y.o. female with medical history significant of HTH, HLD, asthma, GERD, hypothyroidism, obesity obesity BMI 37.00, NSCLC (s/p of radiation therapy, no surgery or chemo), DVT not on AC, lymphedema, GI bleeding, who presents with abdominal pain.   CT-abd/pelvis: Increased dilatation of the colon until mid sigmoid colon with abrupt change in colonic caliber over a length of approximately 6 cm, associated with a small amount of free fluid in pelvis and at RIGHT gutter as well  as diffuse small bowel dilatation.   Findings are most consistent with sigmoid colonic obstruction, question stricture, with no definite mass seen at this site by sigmoidoscopy or prior CT.   Sigmoid diverticulosis without evidence of diverticulitis.   Small amount of free intraperitoneal fluid without free air.   Abdominal pain/Ileus/Sigmoid colon obstruction -Abdominal pain due to sigmoid colon obstruction: Patient does not have vomiting, does not need NG tube now.  -- Dr. Haig Prophet consulted  -As needed morphine for pain -As needed Zofran for nausea -cont IVF -NPO now -- patient is status post flex sigmoidoscopy Stricture in the descending colon. Tattooed.- Diverticulosis in the sigmoid colon and in the descending colon. -- Gen. surgery consulted. Dr. Windell Moment to see patient   Bowel obstruction_sigmoid colon -see above   Lymphedema -hold lasix    Hypertension -IV hydralazine as needed   Hypokalemia Potassium is 3.4 -Repleted potassium   LBBB (left bundle branch block) Patient has left bundle blockage on EKG, denies any chest pain or shortness breath.  Troponin negative, 7 -Observe closely   Asthma Stable -Bronchodilators   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.0 to 39.9   BMI= 36.73   and BW= 77 -  Diet and exercise.   -Encourage to lose weight.     Hyponatremia Sodium 127, most likely due to poor oral intake.  Mental status normal. -Check osmolality of plasma and urine, urine sodium level -Check BMP every 8 hours -500 cc normal saline, 75 cc/h -- sodium improving   Hypothyroidism Patient's not taking medications currently -f/u with PCP        Procedures: flex sigmoidoscopy Family communication : family at bedside Consults : G.I., general surgery CODE STATUS: full DVT Prophylaxis :scd Level of care: Telemetry Medical Status is: Inpatient Remains inpatient appropriate because: evaluation management of sigmoid colon stricture/stenosis     TOTAL TIME TAKING CARE OF THIS PATIENT: 35 minutes.  >50% time spent on counselling and coordination of care  Note: This dictation was prepared with Dragon dictation along with smaller phrase technology. Any transcriptional errors that result from this process are unintentional.  [name] M.D    [facility]   CC: Primary care physician; Idelle Crouch, MD

## 2022-01-19 NOTE — TOC Initial Note (Signed)
Transition of Care [facility]) - Initial/Assessment Note    Patient Details  Name: Lindsey Thomas MRN: [id number] Date of Birth: 12/10/1940  Transition of Care [facility]) CM/SW Contact:    Pete Pelt, RN Phone Number: 01/19/2022, 1:13 PM  Clinical Narrative:   Patient lives at home with spouse, no concerns about medication or transportation.  Patient has returned with GI issues, Colonoscopy today.              Expected Discharge Plan: Home/Self Care Barriers to Discharge: Continued Medical Work up   Patient Goals and CMS Choice        Expected Discharge Plan and Services Expected Discharge Plan: Home/Self Care   Discharge Planning Services: CM Consult   Living arrangements for the past 2 months: Single Family Home                                      Prior Living Arrangements/Services Living arrangements for the past 2 months: Single Family Home Lives with:: Self, Spouse Patient language and need for interpreter reviewed:: Yes (no interpreter required)        Need for Family Participation in Patient Care: Yes (Comment) Care giver support system in place?: Yes (comment)   Criminal Activity/Legal Involvement Pertinent to Current Situation/Hospitalization: No - Comment as needed  Activities of Daily Living Home Assistive Devices/Equipment: Cane (specify quad or straight), Walker (specify type), Grab bars in shower, Shower chair without back ADL Screening (condition at time of admission) Patient's cognitive ability adequate to safely complete daily activities?: Yes Is the patient deaf or have difficulty hearing?: Yes Does the patient have difficulty seeing, even when wearing glasses/contacts?: No Does the patient have difficulty concentrating, remembering, or making decisions?: No Patient able to express need for assistance with ADLs?: Yes Does the patient have difficulty dressing or bathing?: No Independently performs ADLs?: Yes (appropriate for developmental  age) Does the patient have difficulty walking or climbing stairs?: Yes Weakness of Legs: Both Weakness of Arms/Hands: None  Permission Sought/Granted                  Emotional Assessment Appearance:: Appears stated age Attitude/Demeanor/Rapport: Gracious, Engaged Affect (typically observed): Pleasant, Appropriate Orientation: : Oriented to Self, Oriented to Place, Oriented to  Time, Oriented to Situation Alcohol / Substance Use: Not Applicable Psych Involvement: No (comment)  Admission diagnosis:  Upper abdominal pain [R10.10] Abdominal pain [R10.9] Patient Active Problem List   Diagnosis Date Noted   Hypothyroidism    Hypokalemia    LBBB (left bundle branch block)    Bowel obstruction_sigmoid colon    Rectal bleeding 01/14/2022   Abdominal pain 01/14/2022   Nausea 01/14/2022   Hyponatremia 01/14/2022   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   GERD (gastroesophageal reflux disease)    Asthma    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.0 to 39.9    Lung nodule    Lower limb ulcer, calf, left, limited to breakdown of skin ([place]) 05/08/2021   Acute deep vein thrombosis (DVT) of proximal vein of left lower extremity ([place]) 01/01/2021   Tendinitis of upper biceps tendon of right shoulder 12/15/2020   Rotator cuff tendinitis, right 12/15/2020   Dyspnea and respiratory abnormalities 02/18/2020   Lung cancer ([place]) 12/03/2018   Hypertension 04/26/2017   Varicose veins of leg with pain, 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   Leg swelling 04/23/2016   Lymphedema 04/23/2016   Venous stasis  12/23/2015   Thyroid nodule 06/25/2015   Anxiety 12/31/2013   Aphasia 12/31/2013   Obesity 12/31/2013   Derangement of posterior horn of medial meniscus 09/13/2013   Neck pain 09/13/2013   Pure hypercholesterolemia 09/13/2013   Disorder of bursae and tendons in shoulder region 09/13/2013   PCP:  Idelle Crouch, MD Pharmacy:   [facility], [place] [address] [place] [address] Phone: [phone] Fax: [phone]  [place], [place] - [address] [place] [address] Phone: [phone] Fax: [phone]  [facility] [place], [place] [address] Phone: [phone] Fax: [phone]     Social Determinants of Health (SDOH) Interventions    Readmission Risk Interventions     No data to display

## 2022-01-19 NOTE — Care Plan (Signed)
Flex sig showed likely stricture in descending/proximal sigmoid colon not able to be traversed with EGD scope. Discussed with general surgery about potential next steps.  Raylene Miyamoto MD, MPH [place]

## 2022-01-20 ENCOUNTER — Encounter
Admission: EM | Disposition: A | Discharge status: Skilled Nursing Facility | Payer: Self-pay | Source: Home / Self Care | Attending: Internal Medicine

## 2022-01-20 ENCOUNTER — Other Ambulatory Visit: Payer: Self-pay

## 2022-01-20 ENCOUNTER — Encounter: Payer: Self-pay | Admitting: Gastroenterology

## 2022-01-20 ENCOUNTER — Inpatient Hospital Stay: Payer: Medicare HMO | Admitting: Anesthesiology

## 2022-01-20 DIAGNOSIS — K56699 Other intestinal obstruction unspecified as to partial versus complete obstruction: Secondary | ICD-10-CM | POA: Diagnosis not present

## 2022-01-20 DIAGNOSIS — I890 Lymphedema, not elsewhere classified: Secondary | ICD-10-CM | POA: Diagnosis not present

## 2022-01-20 DIAGNOSIS — R911 Solitary pulmonary nodule: Secondary | ICD-10-CM

## 2022-01-20 DIAGNOSIS — R1084 Generalized abdominal pain: Secondary | ICD-10-CM | POA: Diagnosis not present

## 2022-01-20 DIAGNOSIS — I10 Essential (primary) hypertension: Secondary | ICD-10-CM | POA: Diagnosis not present

## 2022-01-20 DIAGNOSIS — E162 Hypoglycemia, unspecified: Secondary | ICD-10-CM

## 2022-01-20 HISTORY — PX: COLECTOMY WITH COLOSTOMY CREATION/HARTMANN PROCEDURE: SHX6598

## 2022-01-20 LAB — SODIUM, URINE, RANDOM: Sodium, Ur: 82 mmol/L

## 2022-01-20 LAB — GLUCOSE, CAPILLARY
Glucose-Capillary: 54 mg/dL — ABNORMAL LOW (ref 70–99)
Glucose-Capillary: 165 mg/dL — ABNORMAL HIGH (ref 70–99)

## 2022-01-20 LAB — OSMOLALITY, URINE: Osmolality, Ur: 889 mOsm/kg (ref 300–900)

## 2022-01-20 SURGERY — COLECTOMY, WITH COLOSTOMY CREATION
Anesthesia: General

## 2022-01-20 MED ORDER — PROPOFOL 10 MG/ML IV BOLUS
INTRAVENOUS | Status: AC
  Filled 2022-01-20: qty 20

## 2022-01-20 MED ORDER — SODIUM CHLORIDE 0.9 % IV SOLN: INTRAVENOUS | Status: DC | PRN

## 2022-01-20 MED ORDER — DEXTROSE-NACL 5-0.9 % IV SOLN: INTRAVENOUS | Status: DC

## 2022-01-20 MED ORDER — FENTANYL CITRATE (PF) 100 MCG/2ML IJ SOLN
INTRAMUSCULAR | Status: AC
  Filled 2022-01-20: qty 2

## 2022-01-20 MED ORDER — SODIUM CHLORIDE (PF) 0.9 % IJ SOLN
INTRAMUSCULAR | Status: AC
  Filled 2022-01-20: qty 50

## 2022-01-20 MED ORDER — DEXAMETHASONE SODIUM PHOSPHATE 10 MG/ML IJ SOLN
INTRAMUSCULAR | Status: AC
  Filled 2022-01-20: qty 1

## 2022-01-20 MED ORDER — FENTANYL CITRATE (PF) 100 MCG/2ML IJ SOLN
INTRAMUSCULAR | Status: DC | PRN
  Administered 2022-01-20 (×3): 25 ug via INTRAVENOUS
  Administered 2022-01-20 (×2): 50 ug via INTRAVENOUS

## 2022-01-20 MED ORDER — PHENYLEPHRINE HCL (PRESSORS) 10 MG/ML IV SOLN
INTRAVENOUS | Status: AC
  Filled 2022-01-20: qty 1

## 2022-01-20 MED ORDER — PHENYLEPHRINE HCL-NACL 20-0.9 MG/250ML-% IV SOLN
INTRAVENOUS | Status: DC | PRN
  Administered 2022-01-20: 50 ug/min via INTRAVENOUS

## 2022-01-20 MED ORDER — ACETAMINOPHEN 10 MG/ML IV SOLN
INTRAVENOUS | Status: DC | PRN
  Administered 2022-01-20: 1000 mg via INTRAVENOUS

## 2022-01-20 MED ORDER — BUPIVACAINE LIPOSOME 1.3 % IJ SUSP
INTRAMUSCULAR | Status: AC
  Filled 2022-01-20: qty 20

## 2022-01-20 MED ORDER — ESMOLOL HCL 100 MG/10ML IV SOLN
INTRAVENOUS | Status: DC | PRN
  Administered 2022-01-20: 10 mg via INTRAVENOUS

## 2022-01-20 MED ORDER — SODIUM CHLORIDE 0.9 % IV SOLN
INTRAVENOUS | Status: AC
  Filled 2022-01-20: qty 2

## 2022-01-20 MED ORDER — ACETAMINOPHEN 10 MG/ML IV SOLN: 1000.0000 mg | Freq: Once | INTRAVENOUS | Status: DC | PRN

## 2022-01-20 MED ORDER — LACTATED RINGERS IV SOLN: INTRAVENOUS | Status: DC | PRN

## 2022-01-20 MED ORDER — TRAMADOL HCL 50 MG PO TABS
50.0000 mg | ORAL_TABLET | Freq: Four times a day (QID) | ORAL | Status: AC | PRN
  Administered 2022-01-23: 50 mg
  Filled 2022-01-20: qty 1

## 2022-01-20 MED ORDER — SODIUM CHLORIDE 0.9 % IV SOLN
2.0000 g | Freq: Once | INTRAVENOUS | Status: AC
  Administered 2022-01-20: 2 g via INTRAVENOUS

## 2022-01-20 MED ORDER — SODIUM CHLORIDE (PF) 0.9 % IJ SOLN
INTRAMUSCULAR | Status: DC | PRN
  Administered 2022-01-20: 100 mL

## 2022-01-20 MED ORDER — FENTANYL CITRATE (PF) 100 MCG/2ML IJ SOLN
INTRAMUSCULAR | Status: AC
  Administered 2022-01-20: 50 ug via INTRAVENOUS
  Filled 2022-01-20: qty 2

## 2022-01-20 MED ORDER — LACTATED RINGERS IV SOLN: INTRAVENOUS | Status: DC

## 2022-01-20 MED ORDER — LIDOCAINE HCL (PF) 2 % IJ SOLN
INTRAMUSCULAR | Status: AC
  Filled 2022-01-20: qty 5

## 2022-01-20 MED ORDER — DEXAMETHASONE SODIUM PHOSPHATE 10 MG/ML IJ SOLN
INTRAMUSCULAR | Status: DC | PRN
  Administered 2022-01-20: 8 mg via INTRAVENOUS

## 2022-01-20 MED ORDER — DEXTROSE 50 % IV SOLN
25.0000 g | Freq: Once | INTRAVENOUS | Status: AC
  Administered 2022-01-20: 25 g via INTRAVENOUS
  Filled 2022-01-20: qty 50

## 2022-01-20 MED ORDER — BUPIVACAINE HCL (PF) 0.5 % IJ SOLN
INTRAMUSCULAR | Status: AC
Start: 2022-01-20 — End: ?
  Filled 2022-01-20: qty 30

## 2022-01-20 MED ORDER — ONDANSETRON HCL 4 MG/2ML IJ SOLN
INTRAMUSCULAR | Status: DC | PRN
  Administered 2022-01-20: 4 mg via INTRAVENOUS

## 2022-01-20 MED ORDER — PROPOFOL 10 MG/ML IV BOLUS
INTRAVENOUS | Status: DC | PRN
  Administered 2022-01-20: 100 mg via INTRAVENOUS

## 2022-01-20 MED ORDER — ROCURONIUM BROMIDE 100 MG/10ML IV SOLN
INTRAVENOUS | Status: DC | PRN
  Administered 2022-01-20: 20 mg via INTRAVENOUS
  Administered 2022-01-20: 30 mg via INTRAVENOUS
  Administered 2022-01-20: 50 mg via INTRAVENOUS

## 2022-01-20 MED ORDER — ONDANSETRON HCL 4 MG/2ML IJ SOLN
4.0000 mg | Freq: Once | INTRAMUSCULAR | Status: DC | PRN
Start: 2022-01-20 — End: 2022-01-20

## 2022-01-20 MED ORDER — ACETAMINOPHEN 10 MG/ML IV SOLN
INTRAVENOUS | Status: AC
  Filled 2022-01-20: qty 100

## 2022-01-20 MED ORDER — ONDANSETRON HCL 4 MG/2ML IJ SOLN
INTRAMUSCULAR | Status: AC
  Filled 2022-01-20: qty 2

## 2022-01-20 MED ORDER — FENTANYL CITRATE (PF) 100 MCG/2ML IJ SOLN
25.0000 ug | INTRAMUSCULAR | Status: DC | PRN
  Administered 2022-01-20 (×2): 50 ug via INTRAVENOUS

## 2022-01-20 MED ORDER — MORPHINE SULFATE (PF) 4 MG/ML IV SOLN
4.0000 mg | INTRAVENOUS | Status: DC | PRN
  Filled 2022-01-20: qty 1

## 2022-01-20 MED ORDER — LIDOCAINE HCL (CARDIAC) PF 100 MG/5ML IV SOSY
PREFILLED_SYRINGE | INTRAVENOUS | Status: DC | PRN
  Administered 2022-01-20: 100 mg via INTRAVENOUS

## 2022-01-20 MED ORDER — 0.9 % SODIUM CHLORIDE (POUR BTL) OPTIME
TOPICAL | Status: DC | PRN
  Administered 2022-01-20: 1000 mL
  Administered 2022-01-20: 4000 mL
  Administered 2022-01-20: 150 mL

## 2022-01-20 MED ORDER — ESMOLOL HCL 100 MG/10ML IV SOLN
INTRAVENOUS | Status: AC
  Filled 2022-01-20: qty 10

## 2022-01-20 MED ORDER — MORPHINE SULFATE (PF) 2 MG/ML IV SOLN
2.0000 mg | INTRAVENOUS | Status: DC | PRN
  Administered 2022-01-21 – 2022-01-27 (×10): 2 mg via INTRAVENOUS
  Filled 2022-01-20 (×11): qty 1

## 2022-01-20 SURGICAL SUPPLY — 48 items
BLADE SURG 15 STRL LF DISP TIS (BLADE) ×1 IMPLANT
BLADE SURG 15 STRL SS (BLADE) ×2
DRAIN PENROSE 5/8X18 LTX STRL (DRAIN) ×1 IMPLANT
DRAPE LAPAROTOMY 100X77 ABD (DRAPES) ×2 IMPLANT
DRSG OPSITE POSTOP 4X12 (GAUZE/BANDAGES/DRESSINGS) ×1 IMPLANT
ELECT BLADE 6.5 EXT (BLADE) ×1 IMPLANT
ELECT REM PT RETURN 9FT ADLT (ELECTROSURGICAL) ×2
ELECTRODE REM PT RTRN 9FT ADLT (ELECTROSURGICAL) ×1 IMPLANT
GAUZE 4X4 16PLY ~~LOC~~+RFID DBL (SPONGE) ×2 IMPLANT
GLOVE BIO SURGEON STRL SZ 6.5 (GLOVE) ×4 IMPLANT
GLOVE BIOGEL PI IND STRL 6.5 (GLOVE) ×2 IMPLANT
GLOVE BIOGEL PI INDICATOR 6.5 (GLOVE) ×4
GOWN STRL REUS W/ TWL LRG LVL3 (GOWN DISPOSABLE) ×6 IMPLANT
GOWN STRL REUS W/TWL LRG LVL3 (GOWN DISPOSABLE) ×12
HOLDER FOLEY CATH W/STRAP (MISCELLANEOUS) ×2 IMPLANT
KIT OSTOMY 2 PC DRNBL 2.25 STR (WOUND CARE) IMPLANT
KIT OSTOMY DRAINABLE 2.25 STR (WOUND CARE) ×2
KIT TURNOVER KIT A (KITS) ×2 IMPLANT
LABEL OR SOLS (LABEL) ×2 IMPLANT
LIGASURE IMPACT 36 18CM CVD LR (INSTRUMENTS) ×2 IMPLANT
MANIFOLD NEPTUNE II (INSTRUMENTS) ×2 IMPLANT
NEEDLE HYPO 22GX1.5 SAFETY (NEEDLE) ×4 IMPLANT
NS IRRIG 1000ML POUR BTL (IV SOLUTION) ×7 IMPLANT
PACK BASIN MAJOR ARMC (MISCELLANEOUS) ×2 IMPLANT
PACK COLON CLEAN CLOSURE (MISCELLANEOUS) ×2 IMPLANT
RELOAD BL CONTOUR (ENDOMECHANICALS) ×2 IMPLANT
RELOAD STAPLE 40 BLU REG (ENDOMECHANICALS) IMPLANT
SOL PREP PVP 2OZ (MISCELLANEOUS) ×2
SOLUTION PREP PVP 2OZ (MISCELLANEOUS) ×1 IMPLANT
SPONGE T-LAP 18X18 ~~LOC~~+RFID (SPONGE) ×6 IMPLANT
STAPLER CVD CUT BL 40 RELOAD (ENDOMECHANICALS) ×2 IMPLANT
STAPLER CVD CUT BLU 40 RELOAD (ENDOMECHANICALS) IMPLANT
STAPLER SKIN PROX 35W (STAPLE) ×2 IMPLANT
SUT DVC VLOC 90 3-0 CV23 VLT (SUTURE) ×2
SUT PDS AB 0 CT1 27 (SUTURE) ×4 IMPLANT
SUT PROLENE 2 0 FS (SUTURE) ×1 IMPLANT
SUT SILK 3-0 (SUTURE) ×2 IMPLANT
SUT V-LOC 90 ABS 3-0 VLT  V-20 (SUTURE) ×8
SUT V-LOC 90 ABS 3-0 VLT V-20 (SUTURE) IMPLANT
SUT VIC AB 2-0 CT2 27 (SUTURE) ×1 IMPLANT
SUT VIC AB 3-0 SH 27 (SUTURE) ×8
SUT VIC AB 3-0 SH 27X BRD (SUTURE) ×2 IMPLANT
SUT VIC AB 3-0 SH 8-18 (SUTURE) ×2 IMPLANT
SUTURE DVC VLC 90 3-0 CV23 VLT (SUTURE) IMPLANT
SYR 20ML LL LF (SYRINGE) ×4 IMPLANT
TRAP FLUID SMOKE EVACUATOR (MISCELLANEOUS) ×2 IMPLANT
TRAY FOLEY MTR SLVR 16FR STAT (SET/KITS/TRAYS/PACK) ×2 IMPLANT
WATER STERILE IRR 500ML POUR (IV SOLUTION) ×2 IMPLANT

## 2022-01-20 NOTE — Progress Notes (Signed)
  Progress Note   Patient: Lindsey Thomas:[id number] DOB: 01-27-1941 DOA: 01/18/2022     2 DOS: the patient was seen and examined on 01/20/2022     Assessment and Plan: * Bowel obstruction_sigmoid colon Patient to go to the operating room today by general surgery.  Likely will need 3 to 5 days postoperatively in the hospital.  Abdominal pain Abdominal pain due to sigmoid colon obstruction.  Patient go to the operating room today.  Lymphedema -hold lasix   Hypertension -IV hydralazine as needed -  Hypokalemia Potassium in the normal range at 3.7  LBBB (left bundle branch block) Patient has left bundle blockage on EKG. outpatient stress test which was negative in April 2023.  Asthma Stable -Bronchodilators  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.0 to 39.9 BMI 36.73 with recent height and weight in computer.   Hyponatremia Sodium up to 131.  Continue fluids while NPO.  Lung nodule Small pulmonary nodules measuring 4 mm or less in size is in the lung bases.  Will need follow-up with her oncology team as outpatient especially with history of lung cancer.  Hypoglycemia Sugar 54 this morning.  Question amp of D50 and added D5 to IV fluids likely secondary to being n.p.o.        Subjective: Patient recently in the hospital.  She was discharged home but ended up coming back because she was not eating having abdominal distention and also some diarrhea.  Found this time to have a colonic obstruction which was not seen on prior CT scan or flexible sigmoidoscopy.  Physical Exam: Vitals:   01/20/22 0435 01/20/22 0800 01/20/22 1310 01/20/22 1356  BP: 139/61  119/61 (!) 129/50  Pulse: 95  100 98  Resp: 17 16 19 16   Temp: 98 F (36.7 C)  97.9 F (36.6 C) 98.6 F (37 C)  TempSrc:    Temporal  SpO2: 98%  97% 99%  Weight:      Height:       Physical Exam HENT:     Head: Normocephalic.     Mouth/Throat:     Pharynx: No oropharyngeal exudate.  Eyes:     General:  Lids are normal.     Conjunctiva/sclera: Conjunctivae normal.  Cardiovascular:     Rate and Rhythm: Normal rate and regular rhythm.     Heart sounds: Normal heart sounds, S1 normal and S2 normal.  Pulmonary:     Breath sounds: No decreased breath sounds, wheezing, rhonchi or rales.  Abdominal:     General: Bowel sounds are normal. There is distension.     Palpations: Abdomen is soft.  Musculoskeletal:     Right lower leg: No swelling.     Left lower leg: No swelling.  Skin:    General: Skin is warm.     Findings: No rash.  Neurological:     Mental Status: She is alert and oriented to person, place, and time.     Data Reviewed: Sodium 131, creatinine 0.54  Family Communication: Spoke with patient's daughter on the phone  Disposition: Status is: Inpatient Remains inpatient appropriate because: Going to the OR today.  Will likely need 3 to 5 days postoperatively in the hospital  Planned Discharge Destination: Home    Time spent: 28 minutes  Author: Loletha Grayer, MD 01/20/2022 1:58 PM  For on call review [email or web address].

## 2022-01-20 NOTE — Anesthesia Postprocedure Evaluation (Signed)
Anesthesia Post Note  Patient: Lindsey Thomas  Procedure(s) Performed: [place]  Patient location during evaluation: Endoscopy Anesthesia Type: General Level of consciousness: awake and alert Pain management: pain level controlled Vital Signs Assessment: post-procedure vital signs reviewed and stable Respiratory status: spontaneous breathing, nonlabored ventilation, respiratory function stable and patient connected to nasal cannula oxygen Cardiovascular status: blood pressure returned to baseline and stable Postop Assessment: no apparent nausea or vomiting Anesthetic complications: no   No notable events documented.   Last Vitals:  Vitals:   01/20/22 0435 01/20/22 0800  BP: 139/61   Pulse: 95   Resp: 17 16  Temp: 36.7 C   SpO2: 98%     Last Pain:  Vitals:   01/20/22 0800  TempSrc:   PainSc: 0-No pain                 Dimas Millin

## 2022-01-20 NOTE — Assessment & Plan Note (Signed)
Small pulmonary nodules measuring 4 mm or less in size is in the lung bases.  Will need follow-up with her oncology team as outpatient especially with history of lung cancer.

## 2022-01-20 NOTE — Progress Notes (Signed)
On call surgical attending paged regarding pain medication.

## 2022-01-20 NOTE — Op Note (Signed)
Preoperative diagnosis: Sigmoid stricture with obstruction.                                            Postoperative diagnosis: Sigmoid stricture with obstruction.                                             Perforation of cecum   Procedure: Sigmoid hemicolectomy with mobilization of splenic flexure and primary anastomosis.                      Repair of cecal perforation and transverse serosal tear                      Loop ileostomy creation  Anesthesia: GETA  Surgeon: Dr. Windell Moment, MD  Wound Classification: Contaminated  Indications:  Patient is a 81 y.o. female with large intestinal obstruction was found to have stricture of the sigmoid colon causing perforation unable to pass with scope  Findings: 1. 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  A serosal tear of the transverse colon was identified.  A serosal tear and small perforation of the cecum was also identified. 3. Both ureter identified and preserved 4. Tension free anastomosis 5. Adequate blood supply to anastomosis appreciated 6. Adequate Hemostasis  Description of procedure: The patient was placed in the supine position and general endotracheal anesthesia was induced. Preoperative antibiotics were given. A Foley catheter, nasogastric tube, and sequential pneumatic compression device were placed. The abdomen was prepped and draped in the usual sterile fashion. A time-out was completed verifying correct patient, procedure, site, positioning, and implant(s) and/or special equipment prior to beginning this procedure.  A vertical midline incision was made from epigastric to just above the pubis. This was deepened through the subcutaneous tissues and hemostasis was achieved with electrocautery. The linea alba was identified and incised and the peritoneal cavity entered. Patient was repositioned in a slight Trendelenburg position. The abdomen was explored.  A hard short sigmoid stricture was palpated.   The descending colon was inspected and retracted medially using a moist towel and self-retaining retractor. Using electrocautery the colon was freed from its peritoneal attachments along the line of Toldt proximally from the splenic flexure and distally to the rectosigmoid junction. Both ureters were identified and protected.  Attention was then turned to mobilizing the splenic flexure. The peritoneum covering the splenic flexure was dissected using electrocautery. The splenocolic ligament and the pancreaticocolic ligaments were divided. The distal transverse colon was dissected from the stomach by freeing the greater omentum.  Points of transection were selected proximally and distally at the rectosigmoid junction. The bowel was divided with the Contour device. The peritoneum on the medial aspect of mesentery was then scored with electrocautery and the vessels were serially clamped, divided, and ligated individually with 2-0 silk sutures. The dissection proceeded to the inferior mesenteric artery which was identified and divided with LigaSure. The location of the left ureter was confirmed prior to dividing the IMA. Dissection continued to the sigmoid artery which was divided and ligated LIgaSure. The rectosigmoid junction was mobilized and the loose tissues in the presacral space were sharply dissected with electrocautery down to the level of the sacral promontory. The rectosigmoid junction was identified  by the splaying of the teniae at the level of the sacral promontory.  Hemostasis was checked in the operative field. The two ends of the bowel were checked and found to be viable, with excellent blood supply.  Attention was turned to performing a handsewn coloproctostomy.  First the posterior outer layer was done with 3-0 V-Loc.  Then 2 colotomies were done on the descending colon and the rectum respectively.  Then the inner layer of the anastomosis was done circumferentially with 3-0 V-Loc.  Then the outer  layer was completed circumferentially. Then the rest of the large intestine was inspected and a serosal tear from spontaneous serosal tear of 1 cm was identified.  This was caused by the distention of the large intestine and it was preoperatively.  The tear was repaired with 3-0 Vicryl.  Then a 5 cm serosal tear of the cecum was identified.  A very small small perforation was seen within the tear.  The serosal tear and perforation was repaired with single layer closure with 3-0 V-Loc. The anastomosis was checked and found to be intact and widely patent.  The abdominal cavity was then copiously irrigated and hemostasis was checked. Attention was turned to the cecum, in order to identify the terminal ileum. An appropriate segment of distal ileum with adequate mesenteric length was selected. Next, a Kelly clamp was used to carefully dissect a small hole in the associated mesentery just below the apex of the loop, in order to thread a plastic catheter to hold the loop. Marking stitches were placed in order to identify the proximal and distal ends of the ostomy.  A circular disc of skin and subcutaneous tissue was excised at the right lower quadrant. Hemostasis was achieved, and the underlying fascia was incised in order to allow two fingerbreadths of space. With the guidance of the plastic catheter, and care not to disturb the correct orientation of the ends of the ileostomy, the loop was delivered through the ostomy site.  Then we proceeded to perform a clean closure.  After checking that the mesentery was not under tension, and that hemostasis was achieved, the midline abdominal incision was closed. First, the fascia was reapproximated with 0 running PDS, followed by 3-0 interrupted subcutaneous sutures, and finally skin was closed with staples. The wound was protected with towels to prevent contamination.  A four-fifths circumferential incision was made toward the proximal aspect of the ileal loop, leaving at  least one centimeter of space from the incision to the skin edge. After removing the marking stitches, the cut edges were everted to create a spigot shape of the ostomy. The bowel edges were sutured to the surrounding skin by taking full-thickness bites of the edges of the ileum, then to a seromuscular bite at the base of the ostomy, and then to the dermis of the skin. This was carried out around the complete circumference of the ostomy.  The ostomy appliance was placed, and the midline wound was dressed with a sterile bandage. The patient was awoken from anesthesia and taken to the recovery room in stable condition. All instruments, needles, and towels had been accounted for. The patient tolerated the procedure well and was taken to the postanesthesia care unit in stable condition.   Specimen: Sigmoid colon  Complications: None  Estimated Blood Loss: 100 mL

## 2022-01-20 NOTE — Progress Notes (Signed)
   01/20/22 2233  Vitals  Temp 97.8 F (36.6 C)  Temp Source Oral  BP Location Right Arm  BP Method Automatic  Patient Position (if appropriate) Lying  Pulse Rate Source Dinamap  Level of Consciousness  Level of Consciousness Alert  MEWS COLOR  MEWS Score Color Red  Pain Assessment  Pain Scale 0-10  Pain Score Asleep  PCA/Epidural/Spinal Assessment  Respiratory Pattern Regular;Unlabored;Symmetrical  Glasgow Coma Scale  Eye Opening 4  Best Verbal Response (NON-intubated) 5  Best Motor Response 6  Glasgow Coma Scale Score 15  MEWS Score  MEWS Temp 0  MEWS Systolic 1  MEWS Pulse 2  MEWS RR 1  MEWS LOC 0  MEWS Score 4     Patient arrived, comfort afforded.

## 2022-01-20 NOTE — Interval H&P Note (Signed)
History and Physical Interval Note:  01/20/2022 3:01 PM  Lindsey Thomas  has presented today for surgery, with the diagnosis of sigmoid stricture.  The various methods of treatment have been discussed with the patient and family. After consideration of risks, benefits and other options for treatment, the patient has consented to  Procedure(s): COLECTOMY WITH COLOSTOMY CREATION/HARTMANN PROCEDURE (N/A) as a surgical intervention.  The patient's history has been reviewed, patient examined, no change in status, stable for surgery.  I have reviewed the patient's chart and labs.  Questions were answered to the patient's satisfaction.     Herbert Pun

## 2022-01-20 NOTE — Consult Note (Signed)
[facility] Nurse requested for preoperative stoma site marking  Discussed surgical procedure and stoma creation with patient and family.  Explained role of the [facility] nurse team.  Provided the patient with educational booklet and provided samples of pouching options.  Answered patient and family questions.   Examined patient lying, sitting, and standing in order to place the marking in the patient's visual field, away from any creases or abdominal contour issues and within the rectus muscle.  Has large rounded abdomen and cannot visualize lower quadrants.  Daughter and spouse at bedside and all agree higher marking may be easier for her.  Marked for colostomy in the LLQ  3 cm to the left of the umbilicus and 1 cm above the umbilicus. Note deep creasing at 5 cm above umbilicus.  Area marked with indelible marker with an XX  Marked for ileostomy in the RLQ  3 cm to the right of the umbilicus and 1 cm above the umbilicus.   Patient's abdomen cleansed with CHG wipes at site markings, allowed to air dry prior to marking.Covered mark with thin film transparent dressing to preserve mark until date of surgery.   [facility] Nurse team will follow up with patient after surgery for continue ostomy care and teaching.   Domenic Moras MSN, RN, FNP-BC [facility] Wound, Ostomy, Continence Nurse Pager [phone]

## 2022-01-20 NOTE — Transfer of Care (Signed)
Immediate Anesthesia Transfer of Care Note  Patient: Lindsey Thomas  Procedure(s) Performed: COLECTOMY WITH COLOSTOMY CREATION/HARTMANN PROCEDURE  Patient Location: PACU  Anesthesia Type:General  Level of Consciousness: awake, alert  and oriented  Airway & Oxygen Therapy: Patient Spontanous Breathing and Patient connected to face mask oxygen  Post-op Assessment: Report given to RN and Post -op Vital signs reviewed and stable  Post vital signs: Reviewed and stable  Last Vitals:  Vitals Value Taken Time  BP 123/58 01/20/22 2030  Temp    Pulse 107 01/20/22 2029  Resp 11 01/20/22 2034  SpO2 97 % 01/20/22 2029  Vitals shown include unvalidated device data.  Last Pain:  Vitals:   01/20/22 1356  TempSrc: Temporal  PainSc: 0-No pain         Complications:  Encounter Notable Events  Notable Event Outcome Phase Comment  Difficult to intubate - expected  Intraprocedure Filed from anesthesia note documentation.

## 2022-01-20 NOTE — Anesthesia Procedure Notes (Addendum)
Procedure Name: Intubation Date/Time: 01/20/2022 3:46 PM  Performed by: Johnna Acosta, CRNAPre-anesthesia Checklist: Patient identified, Emergency Drugs available, Suction available, Patient being monitored and Timeout performed Patient Re-evaluated:Patient Re-evaluated prior to induction Oxygen Delivery Method: Circle system utilized Preoxygenation: Pre-oxygenation with 100% oxygen Induction Type: IV induction Ventilation: Mask ventilation with difficulty and Oral airway inserted - appropriate to patient size Laryngoscope Size: McGraph and 3 Grade View: Grade II Tube type: Oral Tube size: 7.0 mm Number of attempts: 1 Airway Equipment and Method: Stylet and Video-laryngoscopy Placement Confirmation: ETT inserted through vocal cords under direct vision, positive ETCO2 and breath sounds checked- equal and bilateral Secured at: 21 cm Tube secured with: Tape Dental Injury: Teeth and Oropharynx as per pre-operative assessment  Difficulty Due To: Difficulty was anticipated, Difficult Airway- due to reduced neck mobility, Difficult Airway- due to limited oral opening and Difficult Airway- due to anterior larynx Future Recommendations: Recommend- induction with short-acting agent, and alternative techniques readily available

## 2022-01-20 NOTE — Anesthesia Preprocedure Evaluation (Addendum)
Anesthesia Evaluation  Patient identified by MRN, date of birth, ID band Patient awake    Reviewed: Allergy & Precautions, NPO status , Patient's Chart, lab work & pertinent test results  History of Anesthesia Complications Negative for: history of anesthetic complications  Airway Mallampati: IV   Neck ROM: Full    Dental   [place] :   Pulmonary asthma , sleep apnea , COPD,  NSCLC   Pulmonary exam normal breath sounds clear to auscultation       Cardiovascular hypertension, +CHF  Normal cardiovascular exam Rhythm:Regular Rate:Normal  Hx DVT  ECG 01/18/22: Sinus tachycardia (HR 125); LBBB  Echo 12/14/21:  NORMAL LEFT VENTRICULAR SYSTOLIC FUNCTION  NORMAL RIGHT VENTRICULAR SYSTOLIC FUNCTION  MODERATE VALVULAR REGURGITATION   NO VALVULAR STENOSIS  MODERATE AI- Pt1/2 408 ms; LVIDd 4.4 cm   Myocardial perfusion 09/16/21:  1. Poor exercise capacity  2. No ischemia noted on current study. Small fixed anterior defect likely consistent with attenuation artificat.  3. Normal LV systolic function   Neuro/Psych  Headaches, PSYCHIATRIC DISORDERS Anxiety    GI/Hepatic GERD  ,  Endo/Other  Hypothyroidism Obesity   Renal/GU negative Renal ROS     Musculoskeletal  (+) Arthritis ,   Abdominal   Peds  Hematology negative hematology ROS (+)   Anesthesia Other Findings Cardiology note 09/01/21:  81 y.o. female with history of obesity, OSA, hypertension, hyperlipidemia, COPD, DVT on Xarelto.  # Preoperative evaluation She plans to undergo knee surgery (no date set), and wonders about her risk. She is unable to complete 4 METS of exertion, and has HTN, and advanced age. WE will complete stress testing for further risk stratification purposes.   #Essential hypertension Blood pressures been well controlled. She is not currently on any medications for this. -Low-salt diet encouraged.  #Shortness of breath #1.2 cm right upper  lobe nodule s/p radiation She has chronic shortness of breath which there is no clear cardiac etiology. She underwent a chest x-ray for evaluation of her shortness of breath which showed a nodule in her right upper lobe for which a PET scan was concerning for malignancy. She was not a surgical candidate because of her low FEV1 (0.8 L) and thus underwent XRT with decreased size and this nodule. -Continue following with oncology and pulmonology  #Chronic lymphedema Somewhat stable with compression stockings. She has had wounds in her lower extremities previously requiring an Haematologist for healing. -Follows with wound clinic and vascular.  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 Completed therapy in November 2022.   Reproductive/Obstetrics                            Anesthesia Physical Anesthesia Plan  ASA: 3  Anesthesia Plan: General   Post-op Pain Management:    Induction: Intravenous  PONV Risk Score and Plan: 3 and Ondansetron, Dexamethasone and Treatment may vary due to age or medical condition  Airway Management Planned: Oral ETT  Additional Equipment:   Intra-op Plan:   Post-operative Plan: Extubation in OR  Informed Consent: I have reviewed the patients History and Physical, chart, labs and discussed the procedure including the risks, benefits and alternatives for the proposed anesthesia with the patient or authorized representative who has indicated his/her understanding and acceptance.     Dental advisory given  Plan Discussed with: CRNA  Anesthesia Plan Comments: (Patient consented for risks of anesthesia including but not limited to:  - adverse reactions to  medications - damage to eyes, teeth, lips or other oral mucosa - nerve damage due to positioning  - sore throat or hoarseness - damage to heart, brain, nerves, lungs, other parts of body or loss of life  Informed patient about role of CRNA in peri- and intra-operative care.   Patient voiced understanding.)       Anesthesia Quick Evaluation

## 2022-01-20 NOTE — Assessment & Plan Note (Signed)
Sugar 54 this morning.  Question amp of D50 and added D5 to IV fluids likely secondary to being n.p.o.

## 2022-01-21 ENCOUNTER — Encounter: Payer: Self-pay | Admitting: General Surgery

## 2022-01-21 DIAGNOSIS — R1084 Generalized abdominal pain: Secondary | ICD-10-CM | POA: Diagnosis not present

## 2022-01-21 DIAGNOSIS — I10 Essential (primary) hypertension: Secondary | ICD-10-CM | POA: Diagnosis not present

## 2022-01-21 DIAGNOSIS — K56699 Other intestinal obstruction unspecified as to partial versus complete obstruction: Secondary | ICD-10-CM | POA: Diagnosis not present

## 2022-01-21 DIAGNOSIS — I890 Lymphedema, not elsewhere classified: Secondary | ICD-10-CM | POA: Diagnosis not present

## 2022-01-21 LAB — CBC
HCT: 31.3 % — ABNORMAL LOW (ref 36.0–46.0)
Hemoglobin: 10.7 g/dL — ABNORMAL LOW (ref 12.0–15.0)
MCH: 30.3 pg (ref 26.0–34.0)
MCHC: 34.2 g/dL (ref 30.0–36.0)
MCV: 88.7 fL (ref 80.0–100.0)
Platelets: 207 10*3/uL (ref 150–400)
RBC: 3.53 MIL/uL — ABNORMAL LOW (ref 3.87–5.11)
RDW: 14.3 % (ref 11.5–15.5)
WBC: 6.5 10*3/uL (ref 4.0–10.5)
nRBC: 0 % (ref 0.0–0.2)

## 2022-01-21 LAB — BASIC METABOLIC PANEL
Anion gap: 5 (ref 5–15)
Anion gap: 7 (ref 5–15)
BUN: 11 mg/dL (ref 8–23)
BUN: 15 mg/dL (ref 8–23)
CO2: 23 mmol/L (ref 22–32)
CO2: 23 mmol/L (ref 22–32)
Calcium: 7.4 mg/dL — ABNORMAL LOW (ref 8.9–10.3)
Calcium: 7.9 mg/dL — ABNORMAL LOW (ref 8.9–10.3)
Chloride: 107 mmol/L (ref 98–111)
Chloride: 108 mmol/L (ref 98–111)
Creatinine, Ser: 0.61 mg/dL (ref 0.44–1.00)
Creatinine, Ser: 0.62 mg/dL (ref 0.44–1.00)
GFR, Estimated: >60 mL/min (ref >60)
GFR, Estimated: >60 mL/min (ref >60)
Glucose, Bld: 151 mg/dL — ABNORMAL HIGH (ref 70–99)
Glucose, Bld: 150 mg/dL — ABNORMAL HIGH (ref 70–99)
Potassium: 3.4 mmol/L — ABNORMAL LOW (ref 3.5–5.1)
Potassium: 4 mmol/L (ref 3.5–5.1)
Sodium: 135 mmol/L (ref 135–145)
Sodium: 138 mmol/L (ref 135–145)

## 2022-01-21 MED ORDER — KCL IN DEXTROSE-NACL 20-5-0.9 MEQ/L-%-% IV SOLN
INTRAVENOUS | Status: DC
  Filled 2022-01-21 (×9): qty 1000

## 2022-01-21 MED ORDER — METOPROLOL TARTRATE 5 MG/5ML IV SOLN
5.0000 mg | INTRAVENOUS | Status: DC | PRN
  Administered 2022-01-21 – 2022-01-24 (×7): 5 mg via INTRAVENOUS
  Filled 2022-01-21 (×7): qty 5

## 2022-01-21 MED ORDER — METRONIDAZOLE 500 MG/100ML IV SOLN
500.0000 mg | Freq: Two times a day (BID) | INTRAVENOUS | Status: DC
  Administered 2022-01-21 – 2022-01-29 (×17): 500 mg via INTRAVENOUS
  Filled 2022-01-21 (×18): qty 100

## 2022-01-21 MED ORDER — SODIUM CHLORIDE 0.9 % IV SOLN
2.0000 g | INTRAVENOUS | Status: DC
  Administered 2022-01-21 – 2022-01-28 (×9): 2 g via INTRAVENOUS
  Filled 2022-01-21: qty 20
  Filled 2022-01-21: qty 2
  Filled 2022-01-21 (×3): qty 20
  Filled 2022-01-21: qty 2
  Filled 2022-01-21: qty 20
  Filled 2022-01-21: qty 2
  Filled 2022-01-21: qty 20

## 2022-01-21 MED ORDER — ENOXAPARIN SODIUM 40 MG/0.4ML IJ SOSY
40.0000 mg | PREFILLED_SYRINGE | INTRAMUSCULAR | Status: DC
  Administered 2022-01-21 – 2022-01-29 (×8): 40 mg via SUBCUTANEOUS
  Filled 2022-01-21 (×8): qty 0.4

## 2022-01-21 NOTE — Progress Notes (Signed)
With low-grade temperature and tachycardia this afternoon, I will start empiric antibiotics Rocephin and Flagyl to cover intra-abdominal.  As needed metoprolol IV given for fast heart rate since the patient has an NG tube.  Will monitor on telemetry

## 2022-01-21 NOTE — TOC Initial Note (Signed)
Transition of Care [facility]) - Initial/Assessment Note    Patient Details  Name: Lindsey Thomas MRN: [id number] Date of Birth: 1941/04/02  Transition of Care [facility]) CM/SW Contact:    Candie Chroman, LCSW Phone Number: 01/21/2022, 3:11 PM  Clinical Narrative: CSW met with patient. Husband and another female relative (grandson?) at bedside. CSW introduced role and explained that therapy recommendations would be discussed. Patient is agreeable to SNF placement, if needed, at discharge. She is hoping to improve enough to go home with home health. Gave CMS scores for facilities within 25 miles of her zip code. Will wait until NG tube is out to send out referral to avoid automatic denials. No further concerns. CSW encouraged patient and her family to contact CSW as needed. CSW will continue to follow patient for support and facilitate discharge once medically stable.                Expected Discharge Plan: Skilled Nursing Facility Barriers to Discharge: Continued Medical Work up   Patient Goals and CMS Choice   CMS Medicare.gov Compare Post Acute Care list provided to:: Patient (Husband)    Expected Discharge Plan and Services Expected Discharge Plan: [place]   Discharge Planning Services: CM Consult Post Acute Care Choice: [place] (vs home health if improved.) Living arrangements for the past 2 months: Single Family Home                                      Prior Living Arrangements/Services Living arrangements for the past 2 months: Single Family Home Lives with:: Spouse Patient language and need for interpreter reviewed:: Yes Do you feel safe going back to the place where you live?: Yes      Need for Family Participation in Patient Care: Yes (Comment) Care giver support system in place?: Yes (comment)   Criminal Activity/Legal Involvement Pertinent to Current Situation/Hospitalization: No - Comment as needed  Activities of Daily Living Home  Assistive Devices/Equipment: Cane (specify quad or straight), Walker (specify type), Grab bars in shower, Shower chair without back ADL Screening (condition at time of admission) Patient's cognitive ability adequate to safely complete daily activities?: Yes Is the patient deaf or have difficulty hearing?: Yes Does the patient have difficulty seeing, even when wearing glasses/contacts?: No Does the patient have difficulty concentrating, remembering, or making decisions?: No Patient able to express need for assistance with ADLs?: Yes Does the patient have difficulty dressing or bathing?: No Independently performs ADLs?: Yes (appropriate for developmental age) Does the patient have difficulty walking or climbing stairs?: Yes Weakness of Legs: Both Weakness of Arms/Hands: None  Permission Sought/Granted Permission sought to share information with : Facility Sport and exercise psychologist, Family Supports Permission granted to share information with : Yes, Verbal Permission Granted  Share Information with NAME: Lindsey Thomas  Permission granted to share info w AGENCY: SNF's  Permission granted to share info w Relationship: Husband  Permission granted to share info w Contact Information: [phone]  Emotional Assessment Appearance:: Appears stated age Attitude/Demeanor/Rapport: Engaged, Gracious Affect (typically observed): Accepting, Appropriate, Calm, Pleasant Orientation: : Oriented to Self, Oriented to Place, Oriented to  Time, Oriented to Situation Alcohol / Substance Use: Not Applicable Psych Involvement: No (comment)  Admission diagnosis:  Upper abdominal pain [R10.10] Abdominal pain [R10.9] Patient Active Problem List   Diagnosis Date Noted   Hypoglycemia 01/20/2022   Hypothyroidism    Hypokalemia  LBBB (left bundle branch block)    Bowel obstruction_sigmoid colon    Rectal bleeding 01/14/2022   Abdominal pain 01/14/2022   Nausea 01/14/2022   Hyponatremia 01/14/2022   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   GERD (gastroesophageal reflux disease)    Asthma    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.0 to 39.9    Lung nodule    Lower limb ulcer, calf, left, limited to breakdown of skin ([place]) 05/08/2021   Acute deep vein thrombosis (DVT) of proximal vein of left lower extremity ([place]) 01/01/2021   Tendinitis of upper biceps tendon of right shoulder 12/15/2020   Rotator cuff tendinitis, right 12/15/2020   Dyspnea and respiratory abnormalities 02/18/2020   Lung cancer ([place]) 12/03/2018   Hypertension 04/26/2017   Varicose veins of leg with pain, 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   Leg swelling 04/23/2016   Lymphedema 04/23/2016   Venous stasis 12/23/2015   Thyroid nodule 06/25/2015   Anxiety 12/31/2013   Aphasia 12/31/2013   Obesity 12/31/2013   Derangement of posterior horn of medial meniscus 09/13/2013   Neck pain 09/13/2013   Pure hypercholesterolemia 09/13/2013   Disorder of bursae and tendons in shoulder region 09/13/2013   PCP:  Idelle Crouch, MD Pharmacy:   [place], [place] - [address] [place] [address] Phone: [phone] Fax: [phone]  [place], [place] [address] [place] [address] Phone: [phone] Fax: [phone]  [facility] - [place], [place] [address] Phone: [phone] Fax: [phone]     Social Determinants of Health (SDOH) Interventions    Readmission Risk Interventions     No data to display

## 2022-01-21 NOTE — Anesthesia Postprocedure Evaluation (Signed)
Anesthesia Post Note  Patient: Lindsey Thomas  Procedure(s) Performed: COLECTOMY WITH COLOSTOMY CREATION/HARTMANN PROCEDURE  Patient location during evaluation: PACU Anesthesia Type: General Level of consciousness: awake and alert Pain management: pain level controlled Vital Signs Assessment: post-procedure vital signs reviewed and stable Respiratory status: spontaneous breathing, nonlabored ventilation, respiratory function stable and patient connected to nasal cannula oxygen Cardiovascular status: blood pressure returned to baseline and stable Postop Assessment: no apparent nausea or vomiting Anesthetic complications: yes   Encounter Notable Events  Notable Event Outcome Phase Comment  Difficult to intubate - expected  Intraprocedure Filed from anesthesia note documentation.     Last Vitals:  Vitals:   01/20/22 2345 01/21/22 0005  BP: (!) 104/59 (!) 98/56  Pulse: (!) 110 (!) 109  Resp:    Temp:    SpO2:      Last Pain:  Vitals:   01/21/22 0030  TempSrc:   PainSc: Asleep                 Martha Clan

## 2022-01-21 NOTE — Evaluation (Signed)
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  PMH significant for HTH, HLD, asthma, GERD, hypothyroidism, obesity obesity BMI 37.00, NSCLC (s/p of radiation therapy, no surgery or chemo), DVT not on AC, lymphedema, GI bleeding   Clinical Impression   Chart reviewed, RN cleared pt for participation in OT evaluation. Pt is alert, oriented to self, place, situation, oriented to date after stating 1923, then correcting to 2023. PTA pt reports MOD I in ADL/IADL, amb with a RW/SPC as needed. At this time pt presents with deficits in strength, endurance, activity tolerance all affecting safe and optimal ADL completion. Pt requires MAX A for sidelying<>sit with slow movements. MIN A required for grooming, MAX A for LB Dressing tasks. Fair static sitting at edge of bed. Pt is hopeful to discharge home, however with current level of function recommend discharge to STR to address functional deficits and to facilitate return to PLOF. Pt is left as received, NAD, all needs met. OT will follow acutely.      Recommendations for follow up therapy are one component of a multi-disciplinary discharge planning process, led by the attending physician.  Recommendations may be updated based on patient status, additional functional criteria and insurance authorization.   Follow Up Recommendations  Skilled nursing-short term rehab (<3 hours/day)    Assistance Recommended at Discharge Frequent or constant Supervision/Assistance  Patient can return home with the following A lot of help with walking and/or transfers;A lot of help with bathing/dressing/bathroom    Functional Status Assessment  Patient has had a  recent decline in their functional status and demonstrates the ability to make significant improvements in function in a reasonable and predictable amount of time.  Equipment Recommendations  BSC/3in1;Tub/shower seat    Recommendations for Other Services       Precautions / Restrictions Precautions Precautions: Fall Precaution Comments: new ostomy Restrictions Weight Bearing Restrictions: No      Mobility Bed Mobility Overal bed mobility: Needs Assistance Bed Mobility: Sidelying to Sit, Sit to Sidelying       Sit to supine: HOB elevated Sit to sidelying: Max assist, HOB elevated General bed mobility comments: step by step vcs for technique    Transfers Overall transfer level: Needs assistance                 General transfer comment: pt reports "I need lie down" prior to STS attempt, feeling nauseous      Balance Overall balance assessment: Needs assistance Sitting-balance support: Feet supported Sitting balance-Leahy Scale: Fair                                     ADL either performed or assessed with clinical judgement   ADL Overall ADL's : Needs assistance/impaired Eating/Feeding: NPO Eating/Feeding Details (indicate cue type and reason): SET UP for ice chips per MD protocol Grooming: Wash/dry face;Minimal assistance               Lower Body Dressing: Maximal assistance Lower Body Dressing Details (indicate cue type and reason): socks     Toileting- Clothing Manipulation and Hygiene: Maximal assistance  Vision Baseline Vision/History: 1 Wears glasses Patient Visual Report: No change from baseline       Perception     Praxis      Pertinent Vitals/Pain Pain Assessment Pain Assessment: 0-10 Pain Score: 9  Pain Location: abdomen Pain Descriptors / Indicators: Discomfort, Grimacing Pain Intervention(s): Limited activity within patient's tolerance, Monitored during session, Repositioned (RN notified)      Hand Dominance     Extremity/Trunk Assessment Upper Extremity Assessment Upper Extremity Assessment: Generalized weakness   Lower Extremity Assessment Lower Extremity Assessment: Generalized weakness   Cervical / Trunk Assessment Cervical / Trunk Assessment: Normal   Communication Communication Communication: No difficulties   Cognition Arousal/Alertness: Awake/alert Behavior During Therapy: WFL for tasks assessed/performed Overall Cognitive Status: Within Functional Limits for tasks assessed                                       General Comments  HR up to 123 seated on edge of bed, spo2 >90% on RA; RN aware    Exercises Other Exercises Other Exercises: edu re: role of OT, role of rehab, discharge recommendations, home safety, ADL completion, falls prevention   Shoulder Instructions      Home Living Family/patient expects to be discharged to:: Private residence Living Arrangements: Spouse/significant other Available Help at Discharge: Available 24 hours/day Type of Home: House Home Access: Stairs to enter [employer] of Steps: 2 Entrance Stairs-Rails:  (yes)                 Home Equipment: Conservation officer, nature (2 wheels);Cane - single point          Prior Functioning/Environment Prior Level of Function : Independent/Modified Independent             Mobility Comments: pt reports amb with RW in house PRN, use of cane in community PRN ADLs Comments: MOD I in ADL/IADL per pt report        OT Problem List: Decreased strength;Decreased activity tolerance;Decreased knowledge of use of DME or AE      OT Treatment/Interventions: Self-care/ADL training;Patient/family education;Therapeutic exercise;Balance training;DME and/or AE instruction;Therapeutic activities    OT Goals(Current goals can be found in the care plan section) Acute Rehab OT Goals Patient Stated Goal: go home OT Goal Formulation: With patient Time For Goal  Achievement: 02/04/22 Potential to Achieve Goals: Fair ADL Goals Pt Will Perform Grooming: sitting;standing;with supervision Pt Will Transfer to Toilet: with min guard assist;ambulating Pt Will Perform Toileting - Clothing Manipulation and hygiene: with min guard assist;sit to/from stand  OT Frequency: Min 2X/week    Co-evaluation              AM-PAC OT "6 Clicks" Daily Activity     Outcome Measure Help from another person eating meals?: A Little Help from another person taking care of personal grooming?: A Little Help from another person toileting, which includes using toliet, bedpan, or urinal?: A Lot Help from another person bathing (including washing, rinsing, drying)?: A Lot Help from another person to put on and taking off regular upper body clothing?: A Lot Help from another person to put on and taking off regular lower body clothing?: A Lot 6 Click Score: 14   End of Session Equipment Utilized During Treatment: Rolling walker (2 wheels) Nurse Communication: Mobility status (pain level, vital signs)  Activity Tolerance: Patient limited by pain Patient left: in bed;with call bell/phone within reach;with bed  alarm set  OT Visit Diagnosis: Muscle weakness (generalized) (M62.81)                Time: 0156-1537 OT Time Calculation (min): 29 min Charges:  OT General Charges $OT Visit: 1 Visit OT Evaluation $OT Eval Moderate Complexity: 1 Mod OT Treatments $Therapeutic Activity: 8-22 mins  Shanon Payor, OTD OTR/L  01/21/22, 12:28 PM

## 2022-01-21 NOTE — Progress Notes (Signed)
Patient ID: DARON BREEDING, female   DOB: [birth year], 81 y.o.   MRN: [id number]     [place] Hospital Day(s): 3.   Interval History: Patient seen and examined, no acute events or new complaints overnight. Patient reports feeling okay.  Patient denies any severe pain.  She has mild pain that is controlled with current pain medications.  She was found awake, alert and oriented.  She was uncomfortable this morning.  Vital signs in last 24 hours: [min-max] current  Temp:  [97.4 F (36.3 C)-98.6 F (37 C)] 98.6 F (37 C) (08/17 0443) Pulse Rate:  [98-116] 110 (08/17 0443) Resp:  [4-25] 16 (08/17 0443) BP: (77-129)/(47-94) 115/64 (08/17 0443) SpO2:  [93 %-100 %] 99 % (08/17 0424)     Height: 4\' 9"  (144.8 cm) Weight: 77 kg BMI (Calculated): 36.72   Physical Exam:  Constitutional: alert, cooperative and no distress  Respiratory: breathing non-labored at rest  Cardiovascular: regular rate and sinus rhythm  Gastrointestinal: soft, non-tender, and non-distended.  Ileostomy is pink and patent.  Minimal amount of serous fluid but no gas on back.  Labs:     Latest Ref Rng & Units 01/21/2022    5:13 AM 01/19/2022    2:12 AM 01/18/2022    9:29 AM  CBC  WBC 4.0 - 10.5 K/uL 6.5  3.8  4.2   Hemoglobin 12.0 - 15.0 g/dL 10.7  10.8  11.8   Hematocrit 36.0 - 46.0 % 31.3  31.6  34.6   Platelets 150 - 400 K/uL 207  202  246       Latest Ref Rng & Units 01/21/2022    5:13 AM 01/19/2022    2:12 AM 01/18/2022    9:00 PM  CMP  Glucose 70 - 99 mg/dL 151  101  96   BUN 8 - 23 mg/dL 11  21  22    Creatinine 0.44 - 1.00 mg/dL 0.61  0.54  0.58   Sodium 135 - 145 mmol/L 135  131  129   Potassium 3.5 - 5.1 mmol/L 3.4  3.7  3.2   Chloride 98 - 111 mmol/L 107  99  96   CO2 22 - 32 mmol/L 23  23  21    Calcium 8.9 - 10.3 mg/dL 7.4  8.1  8.2     Imaging studies: No new pertinent imaging studies   Assessment/Plan:  81 y.o. 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  -Today found with adequate vital signs, no fever, no tachycardia. -Labs shows normal white blood cell count with stable hemoglobin. -No significant electrolyte disturbance other than mild hypokalemia.  Replace electrolytes as needed -No return of GI function yet.  I will recommend to continue NGT.  Patient can get ice chips. -I encouraged the patient to get out of bed and ambulate.  I will put PT/OT orders for evaluation and management. -Continue DVT prophylaxis. -Continue medical management of medical comorbidities as per primary team. -I will continue to follow very closely  Arnold Long, MD

## 2022-01-21 NOTE — Evaluation (Signed)
Physical Therapy Evaluation Patient Details Name: ROBERTINE KIPPER MRN: [id number] DOB: [birth year] Today's Date: 01/21/2022  History of Present Illness  81 y.o. 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  Clinical Impression  Pt very limited with how much mobility she was able to do today.  She does have multiple co-morbidities that make things even more difficult (b/l knee OA with limited ROM, h/o rotator cuff issues, etc), but her abdominal pain was clearly the major limiter.  Pt was pleasant and willing to work with PT but so pain limited that even the minimal movement we did was a lot for her; despite much encouragement from PT and her husband she could not tolerate standing fully upright (leaning on walker) and could not get to the recliner to sit up.  Pt still very hopeful to improve enough to go home, but at this point knows she is very likely going to need rehab.       Recommendations for follow up therapy are one component of a multi-disciplinary discharge planning process, led by the attending physician.  Recommendations may be updated based on patient status, additional functional criteria and insurance authorization.  Follow Up Recommendations Skilled nursing-short term rehab (<3 hours/day) Can patient physically be transported by private vehicle: No    Assistance Recommended at Discharge Frequent or constant Supervision/Assistance  Patient can return home with the following  Two people to help with walking and/or transfers;A lot of help with bathing/dressing/bathroom;Assistance with cooking/housework;Assist for transportation;Help with stairs or ramp for entrance    Equipment Recommendations  (TBD at rehab)  Recommendations for Other Services       Functional Status  Assessment Patient has had a recent decline in their functional status and demonstrates the ability to make significant improvements in function in a reasonable and predictable amount of time.     Precautions / Restrictions Precautions Precautions: Fall Restrictions Weight Bearing Restrictions: No      Mobility  Bed Mobility Overal bed mobility: Needs Assistance Bed Mobility: Sit to Supine, Sidelying to Sit, Rolling Rolling: Mod assist Sidelying to sit: Max assist   Sit to supine: Max assist   General bed mobility comments: Pt very pain limited and hesitant with any movement, showed good effort but ultimately needed heavy assist to roll and get up/down to/from sitting    Transfers Overall transfer level: Needs assistance Equipment used: Rolling walker (2 wheels) Transfers: Sit to/from Stand Sit to Stand: Max assist           General transfer comment: Pt very much struggled to get feet underneath her and to rise to standing.  Very pain limited and hesitant, heavy assist to rise but unable to even get fully upright leaning elbows on walker and groaning in pain essentially the entire time in standing (directly assisted the entire time to maintain)    Ambulation/Gait               General Gait Details: unable  Stairs            Wheelchair Mobility    Modified Rankin (Stroke Patients Only)       Balance Overall balance assessment: Needs assistance Sitting-balance support: Bilateral upper extremity supported Sitting balance-Leahy Scale: Poor Sitting balance - Comments: Pt w/o abdominal pain, heavily reliant on UEs to maintain static sitting  Pertinent Vitals/Pain Pain Assessment Pain Assessment: 0-10 Pain Score: 9  Pain Location: abdominal incision area    Home Living Family/patient expects to be discharged to:: Unsure (hopes to improve enough to go home) Living Arrangements: Spouse/significant  other Available Help at Discharge: Available 24 hours/day   Home Access: Stairs to enter Entrance Stairs-Rails:  (yes) Entrance Stairs-Number of Steps: 2            Prior Function Prior Level of Function : Independent/Modified Independent             Mobility Comments: Pt reports she gets out of the home, drives, etc ADLs Comments: reports she does not need assist with dressing, bathing, etc     Hand Dominance        Extremity/Trunk Assessment   Upper Extremity Assessment Upper Extremity Assessment: Generalized weakness (h/o L rotator cuff injury/sx, functional movement at wrist/elbow)    Lower Extremity Assessment Lower Extremity Assessment: Generalized weakness (Pt lacks signficant knee flexion ROM (less than 90 b/l), unable to lift against gravity)       Communication   Communication: No difficulties  Cognition Arousal/Alertness: Awake/alert Behavior During Therapy: Anxious Overall Cognitive Status: Within Functional Limits for tasks assessed                                          General Comments General comments (skin integrity, edema, etc.): HR elevated t/o the session 100s to 120s, O2 stayed in 90s on room air    Exercises     Assessment/Plan    PT Assessment Patient needs continued PT services  PT Problem List Decreased strength;Decreased range of motion;Decreased activity tolerance;Decreased balance;Decreased mobility;Decreased knowledge of use of DME;Decreased safety awareness;Pain;Cardiopulmonary status limiting activity       PT Treatment Interventions DME instruction;Gait training;Functional mobility training;Therapeutic activities;Therapeutic exercise;Balance training;Neuromuscular re-education;Patient/family education    PT Goals (Current goals can be found in the Care Plan section)  Acute Rehab PT Goals Patient Stated Goal: pt very much hopes to improve enough to go home PT Goal Formulation: With patient/family Time  For Goal Achievement: 02/03/22 Potential to Achieve Goals: Fair    Frequency Min 2X/week     Co-evaluation               AM-PAC PT "6 Clicks" Mobility  Outcome Measure Help needed turning from your back to your side while in a flat bed without using bedrails?: A Lot Help needed moving from lying on your back to sitting on the side of a flat bed without using bedrails?: A Lot Help needed moving to and from a bed to a chair (including a wheelchair)?: Total Help needed standing up from a chair using your arms (e.g., wheelchair or bedside chair)?: A Lot Help needed to walk in hospital room?: Total Help needed climbing 3-5 steps with a railing? : Total 6 Click Score: 9    End of Session Equipment Utilized During Treatment: Gait belt Activity Tolerance: Patient limited by pain;Patient limited by fatigue Patient left: with bed alarm set;with nursing/sitter in room Nurse Communication: Mobility status PT Visit Diagnosis: Muscle weakness (generalized) (M62.81);Unsteadiness on feet (R26.81);Difficulty in walking, not elsewhere classified (R26.2);Pain Pain - part of body:  (abdominal)    Time: 6213-0865 PT Time Calculation (min) (ACUTE ONLY): 27 min   Charges:   PT Evaluation $PT Eval Moderate Complexity: 1 Mod PT Treatments $Therapeutic Activity: 8-22 mins  Kreg Shropshire, DPT 01/21/2022, 11:54 AM

## 2022-01-21 NOTE — Consult Note (Signed)
[place] Nurse ostomy follow up Patient receiving care in [facility]. No family present. Stoma type/location: RUQ loop ileostomy Today there is minimal serosanginous clear fluid in the operative pouch. The support rod is in place. I provided the patient with the [facility] Ileostomy teaching packet and explained I will have the correct supplies ordered for her, and the first pouch change and teaching session will be tomorrow.  I have asked the [place] to order the following products: 4 of Pouch, Kellie Simmering [phone] (this is a one piece convex pouch), 4 barrier rings, Kellie Simmering [phone], and one ostomy belt [facility]  Val Riles, RN, MSN, CWOCN, CNS-BC, pager [phone]

## 2022-01-21 NOTE — Care Management Important Message (Signed)
Important Message  Patient Details  Name: Lindsey Thomas MRN: [id number] Date of Birth: 1941/01/19   Medicare Important Message Given:  N/A - LOS <3 / Initial given by admissions     Dannette Phylicia 01/21/2022, 8:21 AM

## 2022-01-21 NOTE — Progress Notes (Signed)
  Progress Note   Patient: Lindsey Thomas [id number]:[id number] DOB: 1940-07-02 DOA: 01/18/2022     3 DOS: the patient was seen and examined on 01/21/2022     Assessment and Plan: * Bowel obstruction_sigmoid colon Postoperative day 1.  Still has NG tube in.  NPO.  Patient also had cecal repair along with sigmoidectomy continue IV fluids and supportive care.  General surgery following and PT and OT consultations.  Abdominal pain Postoperative day 1.  Pain control.  Lymphedema Hold lasix   Hypertension -IV hydralazine as needed   Hypokalemia Added potassium to IV fluids and still has NG tube in.  LBBB (left bundle branch block) Patient has left bundle blockage on EKG. outpatient stress test which was negative in April 2023.  Asthma Stable -Bronchodilators  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.0 to 39.9 BMI 36.73 with recent height and weight in computer.   Hyponatremia Last sodium normal range at 135  Lung nodule Small pulmonary nodules measuring 4 mm or less in size is in the lung bases.  Will need follow-up with her oncology team as outpatient especially with history of lung cancer.  Hypoglycemia Resolved with adding D5 to fluids.        Subjective: Patient has some abdominal pain.  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  Physical Exam: Vitals:   01/21/22 0005 01/21/22 0424 01/21/22 0443 01/21/22 0826  BP: (!) 98/56 104/82 115/64 (!) 111/59  Pulse: (!) 109 (!) 110 (!) 110 (!) 110  Resp:  15 16 18   Temp:  (!) 97.4 F (36.3 C) 98.6 F (37 C) 98.2 F (36.8 C)  TempSrc:  Oral Oral Oral  SpO2:  99%  98%  Weight:      Height:       Physical Exam HENT:     Head: Normocephalic.     Mouth/Throat:     Pharynx: No oropharyngeal exudate.  Eyes:     General: Lids are normal.     Conjunctiva/sclera: Conjunctivae normal.  Cardiovascular:     Rate and Rhythm: Normal rate and regular rhythm.      Heart sounds: Normal heart sounds, S1 normal and S2 normal.  Pulmonary:     Breath sounds: No decreased breath sounds, wheezing, rhonchi or rales.  Abdominal:     General: Bowel sounds are decreased. There is distension.     Palpations: Abdomen is soft.     Tenderness: There is generalized abdominal tenderness.  Musculoskeletal:     Right lower leg: No swelling.     Left lower leg: No swelling.  Skin:    General: Skin is warm.     Findings: No rash.  Neurological:     Mental Status: She is alert and oriented to person, place, and time.     Data Reviewed: Potassium 3.4, creatinine 0.61, hemoglobin 10.7, white blood cell count 6.5  Family Communication: Spoke with patient's daughter on the phone  Disposition: Status is: Inpatient Remains inpatient appropriate because: Likely will need 5 days postoperatively here in the hospital  Planned Discharge Destination: Physical therapy recommended rehab but is postoperative day 1 hopefully she will be able to do more as time goes on.    Time spent: 35 minutes  Author: Loletha Grayer, MD 01/21/2022 1:59 PM  For on call review [email or web address].

## 2022-01-22 DIAGNOSIS — K56699 Other intestinal obstruction unspecified as to partial versus complete obstruction: Secondary | ICD-10-CM | POA: Diagnosis not present

## 2022-01-22 DIAGNOSIS — R000 Tachycardia, unspecified: Secondary | ICD-10-CM

## 2022-01-22 DIAGNOSIS — I10 Essential (primary) hypertension: Secondary | ICD-10-CM | POA: Diagnosis not present

## 2022-01-22 DIAGNOSIS — R1084 Generalized abdominal pain: Secondary | ICD-10-CM | POA: Diagnosis not present

## 2022-01-22 DIAGNOSIS — E162 Hypoglycemia, unspecified: Secondary | ICD-10-CM | POA: Diagnosis not present

## 2022-01-22 LAB — CBC
HCT: 33.7 % — ABNORMAL LOW (ref 36.0–46.0)
Hemoglobin: 10.9 g/dL — ABNORMAL LOW (ref 12.0–15.0)
MCH: 29.7 pg (ref 26.0–34.0)
MCHC: 32.3 g/dL (ref 30.0–36.0)
MCV: 91.8 fL (ref 80.0–100.0)
Platelets: 246 10*3/uL (ref 150–400)
RBC: 3.67 MIL/uL — ABNORMAL LOW (ref 3.87–5.11)
RDW: 14.9 % (ref 11.5–15.5)
WBC: 12.4 10*3/uL — ABNORMAL HIGH (ref 4.0–10.5)
nRBC: 0 % (ref 0.0–0.2)

## 2022-01-22 LAB — MAGNESIUM: Magnesium: 2.1 mg/dL (ref 1.7–2.4)

## 2022-01-22 LAB — GLUCOSE, CAPILLARY: Glucose-Capillary: 108 mg/dL — ABNORMAL HIGH (ref 70–99)

## 2022-01-22 MED ORDER — CHLORHEXIDINE GLUCONATE CLOTH 2 % EX PADS
6.0000 | MEDICATED_PAD | Freq: Every day | CUTANEOUS | Status: DC
  Administered 2022-01-22 – 2022-01-30 (×8): 6 via TOPICAL

## 2022-01-22 NOTE — Progress Notes (Signed)
Progress Note   Patient: Lindsey Thomas ERX:[id number] DOB: 05/09/1941 DOA: 01/18/2022     4 DOS: the patient was seen and examined on 01/22/2022     Assessment and Plan: * Bowel obstruction_sigmoid colon Postoperative day 2.  Still has NG tube in.  NPO.  Patient also had cecal repair along with sigmoidectomy continue IV fluids and supportive care.  Empiric Flagyl and Rocephin started last night for low-grade temperature.  Abdominal pain Postoperative day 2.  Pain control.  Appreciate PT and OT consultation.  Lymphedema Hold lasix   Hypertension IV metoprolol as needed   Hypokalemia Added potassium to IV fluids and still has NG tube in.  LBBB (left bundle branch block) Patient has left bundle blockage on EKG. outpatient stress test which was negative in April 2023.  Asthma Stable -Bronchodilators  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.0 to 39.9 BMI 36.73 with recent height and weight in computer.   Hyponatremia Last sodium normal range at 138  Lung nodule Small pulmonary nodules measuring 4 mm or less in size is in the lung bases.  Will need follow-up with her oncology team as outpatient especially with history of lung cancer.  Sinus tachycardia IV metoprolol as needed  Hypoglycemia Resolved with adding D5 to fluids.        Subjective: Patient feels a little bit better today than yesterday.  Had a low-grade temperature yesterday evening.  Still having abdominal pain.  NG tube in this morning.  Physical Exam: Vitals:   01/22/22 0026 01/22/22 0032 01/22/22 0505 01/22/22 0802  BP: (!) 91/47 (!) 99/52 (!) 107/55 (!) 112/55  Pulse: (!) 119 97 (!) 113 100  Resp: 16  18 18   Temp:  98.6 F (37 C) 98.5 F (36.9 C) 98.5 F (36.9 C)  TempSrc:  Oral Oral Axillary  SpO2: 99%  98% 99%  Weight:      Height:       Physical Exam HENT:     Head: Normocephalic.     Mouth/Throat:     Pharynx: No oropharyngeal exudate.  Eyes:     General: Lids are normal.      Conjunctiva/sclera: Conjunctivae normal.  Cardiovascular:     Rate and Rhythm: Regular rhythm. Tachycardia present.     Heart sounds: Normal heart sounds, S1 normal and S2 normal.  Pulmonary:     Breath sounds: No decreased breath sounds, wheezing, rhonchi or rales.  Abdominal:     General: Bowel sounds are decreased. There is distension.     Palpations: Abdomen is soft.     Tenderness: There is generalized abdominal tenderness.  Musculoskeletal:     Right lower leg: No swelling.     Left lower leg: No swelling.  Skin:    General: Skin is warm.     Findings: No rash.  Neurological:     Mental Status: She is alert and oriented to person, place, and time.     Data Reviewed: White blood cell count 12.4, hemoglobin 10.9, platelet count 246, last potassium 4.0 and last creatinine 0.62, magnesium 2.1  Family Communication: Spoke with husband and grandson at the bedside  Disposition: Status is: Inpatient Remains inpatient appropriate because: Postoperative day 2, likely will need 5 days postoperatively in the hospital.  Still has NG tube in.  Planned Discharge Destination: Rehab physical therapy recommends rehab but patient with preferably like to go home.    Time spent: 27 minutes  Author: Loletha Grayer, MD 01/22/2022 11:52 AM  For on  call review [email or web address].

## 2022-01-22 NOTE — Progress Notes (Signed)
Patient ID: MARTESHA NIEDERMEIER, female   DOB: 1941/01/24, 81 y.o.   MRN: [id number]     [place] [facility] Day(s): 4.   Interval History: Patient seen and examined, no acute events or new complaints overnight. Patient reports feeling much better this morning.  She endorses that the abdominal pain is controlled.  She still not passing gas through the ileostomy.  There has been no fever overnight.  Pulse also slowly trending down.  Vital signs in last 24 hours: [min-max] current  Temp:  [98.2 F (36.8 C)-100 F (37.8 C)] 98.5 F (36.9 C) (08/18 0505) Pulse Rate:  [92-127] 113 (08/18 0505) Resp:  [16-18] 18 (08/18 0505) BP: (91-111)/(47-62) 107/55 (08/18 0505) SpO2:  [95 %-99 %] 98 % (08/18 0505)     Height: 4\' 9"  (144.8 cm) Weight: 77 kg BMI (Calculated): 36.72   Physical Exam:  Constitutional: alert, cooperative and no distress  Respiratory: breathing non-labored at rest  Cardiovascular: Tachycardic Gastrointestinal: soft, non-tender, and non-distended.  Ostomy pink and patent.  No gas or stool in the bag.  Labs:     Latest Ref Rng & Units 01/22/2022    5:57 AM 01/21/2022    5:13 AM 01/19/2022    2:12 AM  CBC  WBC 4.0 - 10.5 K/uL 12.4  6.5  3.8   Hemoglobin 12.0 - 15.0 g/dL 10.9  10.7  10.8   Hematocrit 36.0 - 46.0 % 33.7  31.3  31.6   Platelets 150 - 400 K/uL 246  207  202       Latest Ref Rng & Units 01/21/2022    5:42 PM 01/21/2022    5:13 AM 01/19/2022    2:12 AM  CMP  Glucose 70 - 99 mg/dL 150  151  101   BUN 8 - 23 mg/dL 15  11  21    Creatinine 0.44 - 1.00 mg/dL 0.62  0.61  0.54   Sodium 135 - 145 mmol/L 138  135  131   Potassium 3.5 - 5.1 mmol/L 4.0  3.4  3.7   Chloride 98 - 111 mmol/L 108  107  99   CO2 22 - 32 mmol/L 23  23  23    Calcium 8.9 - 10.3 mg/dL 7.9  7.4  8.1     Imaging studies: No new pertinent imaging studies   Assessment/Plan:  81 y.o. 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  -Patient today feeling much better. -No fever overnight.  Highest temp 100.0 -Still with tachycardia, trending down -Agree with IV antibiotic therapy -We will continue with NGT, IV fluids until we get intestinal function return -I encouraged the patient to ambulate -Continue DVT prophylaxis -I will continue to follow closely.  Arnold Long, MD

## 2022-01-22 NOTE — Progress Notes (Signed)
   01/22/22 0000  Assess: MEWS Score  ECG Heart Rate (!) 119  Assess: MEWS Score  MEWS Temp 0  MEWS Systolic 1  MEWS Pulse 2  MEWS RR 0  MEWS LOC 0  MEWS Score 3  MEWS Score Color Yellow  Assess: if the MEWS score is Yellow or Red  Were vital signs taken at a resting state? Yes  Focused Assessment No change from prior assessment  Does the patient meet 2 or more of the SIRS criteria? No  MEWS guidelines implemented *See Row Information* No, vital signs rechecked  Treat  MEWS Interventions Administered prn meds/treatments  Pain Scale 0-10  Pain Score Asleep  Take Vital Signs  Increase Vital Sign Frequency  Yellow: Q 2hr X 2 then Q 4hr X 2, if remains yellow, continue Q 4hrs  Escalate  MEWS: Escalate Yellow: discuss with charge nurse/RN and consider discussing with provider and RRT  Notify: Charge Nurse/RN  Name of Charge Nurse/RN Notified JODI RN  Date Charge Nurse/RN Notified 01/22/22  Time Charge Nurse/RN Notified 0030  Assess: SIRS CRITERIA  SIRS Temperature  0  SIRS Pulse 1  SIRS Respirations  0  SIRS WBC 1  SIRS Score Sum  2     PRN dose of Metoprolol administered. Will reevaluate within 2 hours, per protocol.

## 2022-01-22 NOTE — Assessment & Plan Note (Signed)
IV metoprolol as needed

## 2022-01-22 NOTE — Consult Note (Signed)
[place] Nurse ostomy follow up Patient receiving care in [facility]. Spouse and daughter, [name], present for ostomy teaching session. Stoma type/location: RUQ ileostomy  Stomal assessment/size: slightly less than 1.5 inches, flat, red, moist, producing gas. Patient still has NGT to wall suction. Peristomal assessment: intact Treatment options for stomal/peristomal skin: barrier ring and convex pouch Output: post op clear serosanginous only Ostomy pouching: 1pc. Flexible convex Education provided: Teaching session included all aspects of removing the old system, opening/closing the pouch; cleansing with water only; preparing the new pouch; use of the barrier ring; application of the pouch; and application and use of the ostomy belt.   They still need to be shown and taught about crusting techniques if needed Enrolled patient in [place] Start Discharge program: Yes-today. We discussed that they need to follow the directions in the starter kit box to continue to receive supplies.  Val Riles, RN, MSN, CWOCN, CNS-BC, pager [phone]

## 2022-01-23 DIAGNOSIS — I890 Lymphedema, not elsewhere classified: Secondary | ICD-10-CM | POA: Diagnosis not present

## 2022-01-23 DIAGNOSIS — K56699 Other intestinal obstruction unspecified as to partial versus complete obstruction: Secondary | ICD-10-CM | POA: Diagnosis not present

## 2022-01-23 DIAGNOSIS — R1084 Generalized abdominal pain: Secondary | ICD-10-CM | POA: Diagnosis not present

## 2022-01-23 DIAGNOSIS — I10 Essential (primary) hypertension: Secondary | ICD-10-CM | POA: Diagnosis not present

## 2022-01-23 LAB — BASIC METABOLIC PANEL
Anion gap: 5 (ref 5–15)
BUN: 14 mg/dL (ref 8–23)
CO2: 23 mmol/L (ref 22–32)
Calcium: 7.8 mg/dL — ABNORMAL LOW (ref 8.9–10.3)
Chloride: 111 mmol/L (ref 98–111)
Creatinine, Ser: 0.52 mg/dL (ref 0.44–1.00)
GFR, Estimated: >60 mL/min (ref >60)
Glucose, Bld: 113 mg/dL — ABNORMAL HIGH (ref 70–99)
Potassium: 3.3 mmol/L — ABNORMAL LOW (ref 3.5–5.1)
Sodium: 139 mmol/L (ref 135–145)

## 2022-01-23 LAB — CBC
HCT: 31.3 % — ABNORMAL LOW (ref 36.0–46.0)
Hemoglobin: 10.2 g/dL — ABNORMAL LOW (ref 12.0–15.0)
MCH: 29.5 pg (ref 26.0–34.0)
MCHC: 32.6 g/dL (ref 30.0–36.0)
MCV: 90.5 fL (ref 80.0–100.0)
Platelets: 228 10*3/uL (ref 150–400)
RBC: 3.46 MIL/uL — ABNORMAL LOW (ref 3.87–5.11)
RDW: 15 % (ref 11.5–15.5)
WBC: 9.7 10*3/uL (ref 4.0–10.5)
nRBC: 0 % (ref 0.0–0.2)

## 2022-01-23 LAB — GLUCOSE, CAPILLARY: Glucose-Capillary: 119 mg/dL — ABNORMAL HIGH (ref 70–99)

## 2022-01-23 MED ORDER — SIMETHICONE 80 MG PO CHEW
160.0000 mg | CHEWABLE_TABLET | Freq: Once | ORAL | Status: AC
  Administered 2022-01-23: 160 mg via ORAL
  Filled 2022-01-23: qty 2

## 2022-01-23 NOTE — Progress Notes (Signed)
  Progress Note   Patient: Lindsey Thomas HUT:[id number] DOB: [birth year] DOA: 01/18/2022     5 DOS: the patient was seen and examined on 01/23/2022     Assessment and Plan: * Bowel obstruction_sigmoid colon Postoperative day 3.  Patient also had cecal repair along with sigmoidectomy. Continue IV fluids and supportive care.  Empiric Flagyl and Rocephin started for low-grade temperature the other day.  NG tube out today and started on clear liquid diet.  Abdominal pain Postoperative day 3.  Pain control.  Appreciate PT and OT consultation.  Lymphedema Hold lasix   Hypertension IV metoprolol as needed   Hypokalemia Continue potassium in IV fluids and still has NG tube in.  LBBB (left bundle branch block) Patient has left bundle blockage on EKG. outpatient stress test which was negative in April 2023.  Asthma Stable -Bronchodilators  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.0 to 39.9 BMI 36.73 with recent height and weight in computer.   Hyponatremia Last sodium normal range at 139  Lung nodule Small pulmonary nodules measuring 4 mm or less in size is in the lung bases.  Will need follow-up with her oncology team as outpatient especially with history of lung cancer.  Sinus tachycardia IV metoprolol as needed  Hypoglycemia Resolved with adding D5 to fluids.        Subjective: Patient still having abdominal pain.  Having output out of her ostomy.  Admitted with sigmoid obstruction  Physical Exam: Vitals:   01/22/22 1545 01/22/22 2157 01/23/22 0459 01/23/22 0510  BP: (!) 109/59 (!) 111/51 (!) 107/47   Pulse: (!) 109 (!) 108 (!) 118 (!) 110  Resp: 18 18 20    Temp: 98.8 F (37.1 C) 98 F (36.7 C) 98.9 F (37.2 C)   TempSrc: Oral Oral Oral   SpO2: 94% 97% 97%   Weight:      Height:       Physical Exam HENT:     Head: Normocephalic.     Mouth/Throat:     Pharynx: No oropharyngeal exudate.  Eyes:     General: Lids are normal.     Conjunctiva/sclera:  Conjunctivae normal.  Cardiovascular:     Rate and Rhythm: Regular rhythm. Tachycardia present.     Heart sounds: Normal heart sounds, S1 normal and S2 normal.  Pulmonary:     Breath sounds: No decreased breath sounds, wheezing, rhonchi or rales.  Abdominal:     General: Bowel sounds are normal.     Palpations: Abdomen is soft.     Tenderness: There is generalized abdominal tenderness.  Musculoskeletal:     Right lower leg: Swelling present.     Left lower leg: Swelling present.  Skin:    General: Skin is warm.     Findings: No rash.     Comments: Bruising right lower extremity and left lower extremity and right upper extremity.  Neurological:     Mental Status: She is alert and oriented to person, place, and time.     Data Reviewed: Potassium 3.3, creatinine 0.52, white blood cell count 9.7, hemoglobin 10.2  Family Communication: Spoke with patient's daughter on the phone  Disposition: Status is: Inpatient Remains inpatient appropriate because: Postoperative day 3, NG tube out today and started on clear liquid diet  Planned Discharge Destination: Rehab    Time spent: 28 minutes  Author: Loletha Grayer, MD 01/23/2022 3:31 PM  For on call review [email or web address].

## 2022-01-23 NOTE — Progress Notes (Signed)
   01/23/22 1000  Assess: MEWS Score  ECG Heart Rate (!) 123  Level of Consciousness Alert  Assess: MEWS Score  MEWS Temp 0  MEWS Systolic 0  MEWS Pulse 2  MEWS RR 0  MEWS LOC 0  MEWS Score 2  MEWS Score Color Yellow  Assess: if the MEWS score is Yellow or Red  Were vital signs taken at a resting state? No  Focused Assessment No change from prior assessment  Does the patient meet 2 or more of the SIRS criteria? No  MEWS guidelines implemented *See Row Information* No, vital signs rechecked  Treat  Pain Scale 0-10  Pain Score 0  Notify: Charge Nurse/RN  Name of Charge Nurse/RN Notified Misty RN  Date Charge Nurse/RN Notified 01/23/22  Time Charge Nurse/RN Notified 1000  Assess: SIRS CRITERIA  SIRS Temperature  0  SIRS Pulse 1  SIRS Respirations  0  SIRS WBC 1  SIRS Score Sum  2   Heart rate 104 after iv Metoprolol. Will continue to monitor.  Haydee Salter, RN

## 2022-01-23 NOTE — Progress Notes (Signed)
Mobility Specialist - Progress Note    01/23/22 1500  Mobility  Activity Dangled on edge of bed;Stood at bedside  Level of Assistance Moderate assist, patient does 50-74%  Assistive Device Front wheel walker  Activity Response Tolerated well  $Mobility charge 1 Mobility   Pt supine upon entry, utilizing RA. Pt slowly transferred to EOB and stood using RW with ModA. Pt stood for approximately 2 minutes before returning to bed. Pt left supine with alarm set and needs in reach. Family present at bedside. No complaints.  Candie [place] Mobility Specialist 01/23/22 3:07 PM

## 2022-01-23 NOTE — Progress Notes (Signed)
Patient ID: Lindsey Thomas, female   DOB: November 21, 1940, 81 y.o.   MRN: [id number]     [place] Day(s): 5.   Interval History: Patient seen and examined, no acute events or new complaints overnight. Patient reports feeling a little bit better today.  She endorses that the back has been working adequately.  They have antibiotic 2 times today already.  She has been with NGT clamped since yesterday morning.  She has not had any nausea or vomiting.  She is tolerating clear liquid diet.  Vital signs in last 24 hours: [min-max] current  Temp:  [98 F (36.7 C)-98.9 F (37.2 C)] 98.9 F (37.2 C) (08/19 0459) Pulse Rate:  [108-118] 110 (08/19 0510) Resp:  [18-20] 20 (08/19 0459) BP: (107-111)/(47-59) 107/47 (08/19 0459) SpO2:  [94 %-97 %] 97 % (08/19 0459)     Height: 4\' 9"  (144.8 cm) Weight: 77 kg BMI (Calculated): 36.72   Physical Exam:  Constitutional: alert, cooperative and no distress  Respiratory: breathing non-labored at rest  Cardiovascular: regular rate and sinus rhythm  Gastrointestinal: soft, non-tender, and non-distended.  Ileostomy pink and patent.  Labs:     Latest Ref Rng & Units 01/23/2022    5:33 AM 01/22/2022    5:57 AM 01/21/2022    5:13 AM  CBC  WBC 4.0 - 10.5 K/uL 9.7  12.4  6.5   Hemoglobin 12.0 - 15.0 g/dL 10.2  10.9  10.7   Hematocrit 36.0 - 46.0 % 31.3  33.7  31.3   Platelets 150 - 400 K/uL 228  246  207       Latest Ref Rng & Units 01/23/2022    5:33 AM 01/21/2022    5:42 PM 01/21/2022    5:13 AM  CMP  Glucose 70 - 99 mg/dL 113  150  151   BUN 8 - 23 mg/dL 14  15  11    Creatinine 0.44 - 1.00 mg/dL 0.52  0.62  0.61   Sodium 135 - 145 mmol/L 139  138  135   Potassium 3.5 - 5.1 mmol/L 3.3  4.0  3.4   Chloride 98 - 111 mmol/L 111  108  107   CO2 22 - 32 mmol/L 23  23  23    Calcium 8.9 - 10.3 mg/dL 7.8  7.9  7.4     Imaging studies: No new pertinent imaging studies   Assessment/Plan:  81 y.o. female with sigmoid stricture with  obstruction and cecal perforation 3 Day Post-Op s/p sigmoidectomy with anastomosis and repair of cecal perforation with protective loop ileostomy creation, complicated by pertinent comorbidities including hypertension, hyperlipidemia, asthma, GERD, hypothyroidism.  -Patient recovering adequately -Stable vital signs, no fever -Ileostomy started working with adequate output.  She has been with NGT clamped for more than 24 hours without any nausea or vomiting.  She tolerated clear liquid diet.  I remove the NGT today. -We will plan to advance to full liquids tomorrow if she tolerates liquid diet for the whole 24 hours. -Continue IV antibiotic therapy -I encouraged the patient to ambulate -Continue DVT prophylaxis -I will continue to follow closely.  Arnold Long, MD

## 2022-01-24 ENCOUNTER — Inpatient Hospital Stay: Payer: Medicare HMO | Admitting: Anesthesiology

## 2022-01-24 ENCOUNTER — Inpatient Hospital Stay: Payer: Medicare HMO

## 2022-01-24 ENCOUNTER — Other Ambulatory Visit: Payer: Self-pay

## 2022-01-24 ENCOUNTER — Encounter
Admission: EM | Disposition: A | Discharge status: Skilled Nursing Facility | Payer: Self-pay | Source: Home / Self Care | Attending: Internal Medicine

## 2022-01-24 DIAGNOSIS — E872 Acidosis, unspecified: Secondary | ICD-10-CM

## 2022-01-24 DIAGNOSIS — R1084 Generalized abdominal pain: Secondary | ICD-10-CM | POA: Diagnosis not present

## 2022-01-24 DIAGNOSIS — I959 Hypotension, unspecified: Secondary | ICD-10-CM

## 2022-01-24 DIAGNOSIS — K56699 Other intestinal obstruction unspecified as to partial versus complete obstruction: Secondary | ICD-10-CM | POA: Diagnosis not present

## 2022-01-24 HISTORY — PX: LAPAROTOMY: SHX154

## 2022-01-24 LAB — URINALYSIS, COMPLETE (UACMP) WITH MICROSCOPIC
Bilirubin Urine: NEGATIVE
Glucose, UA: NEGATIVE mg/dL
Ketones, ur: NEGATIVE mg/dL
Leukocytes,Ua: NEGATIVE
Nitrite: NEGATIVE
Protein, ur: 30 mg/dL — AB
Specific Gravity, Urine: 1.027 (ref 1.005–1.030)
pH: 5 (ref 5.0–8.0)

## 2022-01-24 LAB — POCT I-STAT, CHEM 8
BUN: 13 mg/dL (ref 8–23)
Calcium, Ion: 1.32 mmol/L (ref 1.15–1.40)
Chloride: 106 mmol/L (ref 98–111)
Creatinine, Ser: 0.6 mg/dL (ref 0.44–1.00)
Glucose, Bld: 129 mg/dL — ABNORMAL HIGH (ref 70–99)
HCT: 31 % — ABNORMAL LOW (ref 36.0–46.0)
Hemoglobin: 10.5 g/dL — ABNORMAL LOW (ref 12.0–15.0)
Potassium: 4.5 mmol/L (ref 3.5–5.1)
Sodium: 131 mmol/L — ABNORMAL LOW (ref 135–145)
TCO2: 21 mmol/L — ABNORMAL LOW (ref 22–32)

## 2022-01-24 LAB — BLOOD GAS, ARTERIAL
Acid-base deficit: 7.5 mmol/L — ABNORMAL HIGH (ref 0.0–2.0)
Bicarbonate: 19.3 mmol/L — ABNORMAL LOW (ref 20.0–28.0)
FIO2: 50 %
MECHVT: 400 mL
Mechanical Rate: 12
O2 Saturation: 98.9 %
PEEP: 5 cmH2O
Patient temperature: 37
pCO2 arterial: 43 mmHg (ref 32–48)
pH, Arterial: 7.26 — ABNORMAL LOW (ref 7.35–7.45)
pO2, Arterial: 194 mmHg — ABNORMAL HIGH (ref 83–108)

## 2022-01-24 LAB — BASIC METABOLIC PANEL
Anion gap: 8 (ref 5–15)
BUN: 14 mg/dL (ref 8–23)
CO2: 16 mmol/L — ABNORMAL LOW (ref 22–32)
Calcium: 7.4 mg/dL — ABNORMAL LOW (ref 8.9–10.3)
Chloride: 112 mmol/L — ABNORMAL HIGH (ref 98–111)
Creatinine, Ser: 0.65 mg/dL (ref 0.44–1.00)
GFR, Estimated: >60 mL/min (ref >60)
Glucose, Bld: 97 mg/dL (ref 70–99)
Potassium: 4.8 mmol/L (ref 3.5–5.1)
Sodium: 136 mmol/L (ref 135–145)

## 2022-01-24 LAB — CBC WITH DIFFERENTIAL/PLATELET
Abs Immature Granulocytes: 0.17 10*3/uL — ABNORMAL HIGH (ref 0.00–0.07)
Basophils Absolute: 0 10*3/uL (ref 0.0–0.1)
Basophils Relative: 0 %
Eosinophils Absolute: 0 10*3/uL (ref 0.0–0.5)
Eosinophils Relative: 0 %
HCT: 38.5 % (ref 36.0–46.0)
Hemoglobin: 12.4 g/dL (ref 12.0–15.0)
Immature Granulocytes: 1 %
Lymphocytes Relative: 4 %
Lymphs Abs: 0.6 10*3/uL — ABNORMAL LOW (ref 0.7–4.0)
MCH: 29.2 pg (ref 26.0–34.0)
MCHC: 32.2 g/dL (ref 30.0–36.0)
MCV: 90.6 fL (ref 80.0–100.0)
Monocytes Absolute: 0.6 10*3/uL (ref 0.1–1.0)
Monocytes Relative: 3 %
Neutro Abs: 14.8 10*3/uL — ABNORMAL HIGH (ref 1.7–7.7)
Neutrophils Relative %: 92 %
Platelets: 218 10*3/uL (ref 150–400)
RBC: 4.25 MIL/uL (ref 3.87–5.11)
RDW: 15.1 % (ref 11.5–15.5)
Smear Review: UNDETERMINED
WBC: 16.1 10*3/uL — ABNORMAL HIGH (ref 4.0–10.5)
nRBC: 0 % (ref 0.0–0.2)

## 2022-01-24 LAB — MAGNESIUM: Magnesium: 1.8 mg/dL (ref 1.7–2.4)

## 2022-01-24 SURGERY — LAPAROTOMY, EXPLORATORY
Anesthesia: General | Site: Abdomen

## 2022-01-24 MED ORDER — BUPIVACAINE LIPOSOME 1.3 % IJ SUSP
INTRAMUSCULAR | Status: AC
  Filled 2022-01-24: qty 20

## 2022-01-24 MED ORDER — PROPOFOL 10 MG/ML IV BOLUS
INTRAVENOUS | Status: AC
  Filled 2022-01-24: qty 20

## 2022-01-24 MED ORDER — MIDODRINE HCL 5 MG PO TABS
5.0000 mg | ORAL_TABLET | Freq: Three times a day (TID) | ORAL | Status: DC
  Administered 2022-01-24: 5 mg via ORAL
  Filled 2022-01-24: qty 1

## 2022-01-24 MED ORDER — KCL IN DEXTROSE-NACL 20-5-0.9 MEQ/L-%-% IV SOLN
INTRAVENOUS | Status: DC
  Filled 2022-01-24 (×5): qty 1000

## 2022-01-24 MED ORDER — LACTATED RINGERS IV BOLUS
250.0000 mL | Freq: Once | INTRAVENOUS | Status: AC
Start: 2022-01-24 — End: 2022-01-24
  Administered 2022-01-24: 250 mL via INTRAVENOUS

## 2022-01-24 MED ORDER — ALBUTEROL SULFATE (2.5 MG/3ML) 0.083% IN NEBU: 2.5000 mg | INHALATION_SOLUTION | RESPIRATORY_TRACT | Status: DC | PRN

## 2022-01-24 MED ORDER — ONDANSETRON HCL 4 MG/2ML IJ SOLN
INTRAMUSCULAR | Status: AC
  Filled 2022-01-24: qty 2

## 2022-01-24 MED ORDER — BUPIVACAINE HCL (PF) 0.5 % IJ SOLN
INTRAMUSCULAR | Status: AC
  Filled 2022-01-24: qty 30

## 2022-01-24 MED ORDER — DEXAMETHASONE SODIUM PHOSPHATE 10 MG/ML IJ SOLN
INTRAMUSCULAR | Status: AC
  Filled 2022-01-24: qty 1

## 2022-01-24 MED ORDER — BUPIVACAINE HCL (PF) 0.5 % IJ SOLN
INTRAMUSCULAR | Status: DC | PRN
  Administered 2022-01-24 (×2): 75 mg

## 2022-01-24 MED ORDER — SUCCINYLCHOLINE CHLORIDE 200 MG/10ML IV SOSY
PREFILLED_SYRINGE | INTRAVENOUS | Status: DC | PRN
  Administered 2022-01-24: 100 mg via INTRAVENOUS

## 2022-01-24 MED ORDER — ALBUMIN HUMAN 5 % IV SOLN: INTRAVENOUS | Status: DC | PRN

## 2022-01-24 MED ORDER — LIDOCAINE HCL (CARDIAC) PF 100 MG/5ML IV SOSY
PREFILLED_SYRINGE | INTRAVENOUS | Status: DC | PRN
  Administered 2022-01-24: 80 mg via INTRAVENOUS

## 2022-01-24 MED ORDER — SODIUM CHLORIDE 0.9 % IV SOLN: INTRAVENOUS | Status: DC | PRN

## 2022-01-24 MED ORDER — ACETAMINOPHEN 10 MG/ML IV SOLN
INTRAVENOUS | Status: AC
  Filled 2022-01-24: qty 100

## 2022-01-24 MED ORDER — ONDANSETRON HCL 4 MG/2ML IJ SOLN
INTRAMUSCULAR | Status: DC | PRN
  Administered 2022-01-24: 4 mg via INTRAVENOUS

## 2022-01-24 MED ORDER — DROPERIDOL 2.5 MG/ML IJ SOLN: 0.6250 mg | Freq: Once | INTRAMUSCULAR | Status: DC | PRN

## 2022-01-24 MED ORDER — 0.9 % SODIUM CHLORIDE (POUR BTL) OPTIME
TOPICAL | Status: DC | PRN
  Administered 2022-01-24: 500 mL

## 2022-01-24 MED ORDER — LACTATED RINGERS IV SOLN: INTRAVENOUS | Status: DC | PRN

## 2022-01-24 MED ORDER — SUGAMMADEX SODIUM 200 MG/2ML IV SOLN
INTRAVENOUS | Status: DC | PRN
  Administered 2022-01-24: 200 mg via INTRAVENOUS

## 2022-01-24 MED ORDER — ACETAMINOPHEN 325 MG PO TABS
650.0000 mg | ORAL_TABLET | Freq: Four times a day (QID) | ORAL | Status: DC | PRN
  Administered 2022-01-27: 650 mg via ORAL
  Filled 2022-01-24: qty 2

## 2022-01-24 MED ORDER — ALBUTEROL SULFATE (2.5 MG/3ML) 0.083% IN NEBU: 2.5000 mg | INHALATION_SOLUTION | RESPIRATORY_TRACT | Status: DC

## 2022-01-24 MED ORDER — HYDROMORPHONE HCL 1 MG/ML IJ SOLN
INTRAMUSCULAR | Status: AC
  Administered 2022-01-24: 0.25 mg via INTRAVENOUS
  Filled 2022-01-24: qty 1

## 2022-01-24 MED ORDER — ALBUMIN HUMAN 5 % IV SOLN
INTRAVENOUS | Status: AC
  Filled 2022-01-24: qty 250

## 2022-01-24 MED ORDER — DEXAMETHASONE SODIUM PHOSPHATE 10 MG/ML IJ SOLN
INTRAMUSCULAR | Status: DC | PRN
  Administered 2022-01-24: 10 mg via INTRAVENOUS

## 2022-01-24 MED ORDER — ACETAMINOPHEN 10 MG/ML IV SOLN
INTRAVENOUS | Status: DC | PRN
  Administered 2022-01-24: 1000 mg via INTRAVENOUS

## 2022-01-24 MED ORDER — NOREPINEPHRINE 4 MG/250ML-% IV SOLN
INTRAVENOUS | Status: AC
  Filled 2022-01-24: qty 250

## 2022-01-24 MED ORDER — LACTATED RINGERS IV BOLUS: 250.0000 mL | Freq: Once | INTRAVENOUS | Status: DC

## 2022-01-24 MED ORDER — PROPOFOL 10 MG/ML IV BOLUS
INTRAVENOUS | Status: DC | PRN
  Administered 2022-01-24: 100 mg via INTRAVENOUS

## 2022-01-24 MED ORDER — MIDODRINE HCL 5 MG PO TABS: 10.0000 mg | ORAL_TABLET | Freq: Three times a day (TID) | ORAL | Status: DC

## 2022-01-24 MED ORDER — METOPROLOL TARTRATE 5 MG/5ML IV SOLN: 5.0000 mg | INTRAVENOUS | Status: DC | PRN

## 2022-01-24 MED ORDER — LACTATED RINGERS IV BOLUS
500.0000 mL | Freq: Once | INTRAVENOUS | Status: AC
  Administered 2022-01-24: 500 mL via INTRAVENOUS

## 2022-01-24 MED ORDER — CALCIUM CHLORIDE 10 % IV SOLN
INTRAVENOUS | Status: DC | PRN
  Administered 2022-01-24: 1 g via INTRAVENOUS

## 2022-01-24 MED ORDER — FENTANYL CITRATE (PF) 100 MCG/2ML IJ SOLN
INTRAMUSCULAR | Status: AC
  Filled 2022-01-24: qty 2

## 2022-01-24 MED ORDER — IOHEXOL 300 MG/ML  SOLN
100.0000 mL | Freq: Once | INTRAMUSCULAR | Status: AC | PRN
  Administered 2022-01-24: 100 mL via INTRAVENOUS

## 2022-01-24 MED ORDER — PROMETHAZINE HCL 25 MG/ML IJ SOLN: 6.2500 mg | INTRAMUSCULAR | Status: DC | PRN

## 2022-01-24 MED ORDER — BUPIVACAINE LIPOSOME 1.3 % IJ SUSP
INTRAMUSCULAR | Status: DC | PRN
  Administered 2022-01-24 (×2): 10 mL

## 2022-01-24 MED ORDER — ROCURONIUM BROMIDE 100 MG/10ML IV SOLN
INTRAVENOUS | Status: DC | PRN
  Administered 2022-01-24: 20 mg via INTRAVENOUS
  Administered 2022-01-24: 50 mg via INTRAVENOUS

## 2022-01-24 MED ORDER — MIDAZOLAM HCL 2 MG/2ML IJ SOLN
INTRAMUSCULAR | Status: AC
  Filled 2022-01-24: qty 2

## 2022-01-24 MED ORDER — NOREPINEPHRINE 4 MG/250ML-% IV SOLN
INTRAVENOUS | Status: DC | PRN
  Administered 2022-01-24: 8 ug/min via INTRAVENOUS

## 2022-01-24 MED ORDER — CALCIUM CHLORIDE 10 % IV SOLN
INTRAVENOUS | Status: AC
  Filled 2022-01-24: qty 10

## 2022-01-24 MED ORDER — IOHEXOL 9 MG/ML PO SOLN: 500.0000 mL | ORAL | Status: DC

## 2022-01-24 MED ORDER — FENTANYL CITRATE (PF) 100 MCG/2ML IJ SOLN
INTRAMUSCULAR | Status: DC | PRN
  Administered 2022-01-24 (×2): 25 ug via INTRAVENOUS

## 2022-01-24 MED ORDER — HYDROMORPHONE HCL 1 MG/ML IJ SOLN
0.2500 mg | INTRAMUSCULAR | Status: DC | PRN
  Administered 2022-01-24 (×2): 0.25 mg via INTRAVENOUS

## 2022-01-24 SURGICAL SUPPLY — 46 items
BULB RESERV EVAC DRAIN JP 100C (MISCELLANEOUS) IMPLANT
CHLORAPREP W/TINT 26 (MISCELLANEOUS) ×1 IMPLANT
DRAIN CHANNEL JP 19F (MISCELLANEOUS) IMPLANT
DRAPE LAPAROTOMY 100X77 ABD (DRAPES) ×1 IMPLANT
DRSG OPSITE POSTOP 4X10 (GAUZE/BANDAGES/DRESSINGS) ×1 IMPLANT
DRSG OPSITE POSTOP 4X8 (GAUZE/BANDAGES/DRESSINGS) ×1 IMPLANT
ELECT REM PT RETURN 9FT ADLT (ELECTROSURGICAL) ×1
ELECTRODE REM PT RTRN 9FT ADLT (ELECTROSURGICAL) ×1 IMPLANT
GAUZE 4X4 16PLY ~~LOC~~+RFID DBL (SPONGE) ×1 IMPLANT
GLOVE BIO SURGEON STRL SZ 6.5 (GLOVE) ×1 IMPLANT
GLOVE BIOGEL PI IND STRL 6.5 (GLOVE) ×1 IMPLANT
GLOVE BIOGEL PI IND STRL 7.5 (GLOVE) IMPLANT
GLOVE BIOGEL PI INDICATOR 6.5 (GLOVE) ×1
GLOVE BIOGEL PI INDICATOR 7.5 (GLOVE) ×2
GOWN STRL REUS W/ TWL LRG LVL3 (GOWN DISPOSABLE) ×2 IMPLANT
GOWN STRL REUS W/TWL LRG LVL3 (GOWN DISPOSABLE) ×2
KIT OSTOMY 2 PC DRNBL 2.25 STR (WOUND CARE) IMPLANT
KIT OSTOMY DRAINABLE 2.25 STR (WOUND CARE) ×1
KIT TURNOVER KIT A (KITS) ×1 IMPLANT
LABEL OR SOLS (LABEL) ×1 IMPLANT
LIGASURE IMPACT 36 18CM CVD LR (INSTRUMENTS) IMPLANT
MANIFOLD NEPTUNE II (INSTRUMENTS) ×1 IMPLANT
NS IRRIG 1000ML POUR BTL (IV SOLUTION) ×1 IMPLANT
PACK BASIN MAJOR ARMC (MISCELLANEOUS) ×1 IMPLANT
PACK COLON CLEAN CLOSURE (MISCELLANEOUS) ×1 IMPLANT
RELOAD LINEAR CUT PROX 55 BLUE (ENDOMECHANICALS) ×1 IMPLANT
RELOAD STAPLE 55 3.8 BLU REG (ENDOMECHANICALS) IMPLANT
SPONGE T-LAP 18X18 ~~LOC~~+RFID (SPONGE) ×4 IMPLANT
STAPLER CVD CUT BL 40 RELOAD (ENDOMECHANICALS) ×1 IMPLANT
STAPLER CVD CUT BLU 40 RELOAD (ENDOMECHANICALS) IMPLANT
STAPLER PROXIMATE 55 BLUE (STAPLE) IMPLANT
SUT ETHILON 3-0 FS-10 30 BLK (SUTURE) ×1
SUT PDS AB 0 CT1 27 (SUTURE) ×2 IMPLANT
SUT PROLENE 2 0 SH DA (SUTURE) IMPLANT
SUT SILK 2 0 (SUTURE) ×1
SUT SILK 2-0 18XBRD TIE 12 (SUTURE) ×1 IMPLANT
SUT SILK 3 0 (SUTURE) ×1
SUT SILK 3-0 18XBRD TIE 12 (SUTURE) ×1 IMPLANT
SUT VIC AB 3-0 SH 27 (SUTURE) ×2
SUT VIC AB 3-0 SH 27X BRD (SUTURE) ×2 IMPLANT
SUT VICRYL 3-0 CR8 SH (SUTURE) IMPLANT
SUTURE EHLN 3-0 FS-10 30 BLK (SUTURE) IMPLANT
TRAP FLUID SMOKE EVACUATOR (MISCELLANEOUS) ×1 IMPLANT
TRAY FOLEY MTR SLVR 16FR STAT (SET/KITS/TRAYS/PACK) ×1 IMPLANT
TRAY FOLEY SLVR 16FR LF STAT (SET/KITS/TRAYS/PACK) IMPLANT
WATER STERILE IRR 500ML POUR (IV SOLUTION) ×1 IMPLANT

## 2022-01-24 NOTE — Anesthesia Procedure Notes (Addendum)
Arterial Line Insertion Start/End8/20/2023 5:57 PM, 01/24/2022 5:57 PM Performed by: Iran Ouch, MD  Patient location: [facility] procedure area. Preanesthetic checklist: patient identified, IV checked, site marked, risks and benefits discussed, surgical consent, monitors and equipment checked, pre-op evaluation, timeout performed and anesthesia consent Lidocaine 1% used for infiltration Left, radial was placed Catheter size: 20 G Hand hygiene performed  and maximum sterile barriers used   Attempts: 1 Procedure performed using ultrasound guided technique. Ultrasound Notes:anatomy identified and no ultrasound evidence of intravascular and/or intraneural injection Following insertion, dressing applied. Post procedure assessment: normal and unchanged  Post procedure complications: unsuccessful attempts and local hematoma. Additional procedure comments: Two attempts unsuccessful. Radial pulsatile flow noted in distal vessel after attempts. Ulnar artery with pulsatile flow. Marland Kitchen

## 2022-01-24 NOTE — Progress Notes (Signed)
Progress Note   Patient: Lindsey Thomas TOI:[id number] DOB: [birth year] DOA: 01/18/2022     6 DOS: the patient was seen and examined on 01/24/2022   Brief hospital course: 81 year old female with history of non-small cell lung cancer, hyperlipidemia, lymphedema, sleep apnea, COPD.  She presented back to the hospital with abdominal pain and distention.  Repeat CT scan showed increased dilatation of the colon until the mid sigmoid colon where there is abrupt change in colonic caliber consistent with sigmoid colonic obstruction.  Repeat flexible sigmoidoscopy showed a stricture in the descending colon.  Patient was brought to the operating room by Dr. Windell Moment and noted a serosal tear in the transverse colon and a perforation of the cecum was identified.  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  A sigmoidectomy was done, cecal perforation and serosal tear of the transverse colon was repaired.  Loop ileostomy creation.  The patient had a low-grade temperature on 01/21/2022 and empiric Rocephin and Flagyl was started.  The patient had some hypotension early morning on 01/24/2022 and was given a fluid bolus.  CO2 on the chemistry was lower at 16 and white count went up to 16.1.  Repeat CT scan of the abdomen pelvis ordered by Dr. Windell Moment.  The patient has not urinated since Foley was removed yesterday.  In and out catheterization to rule out retention.  Assessment and Plan: * Hypotension Continue IV fluid hydration.  Patient is mentating well.  Start midodrine.  CT scan of the abdomen pelvis to rule out intra-abdominal infection.  Continue IV antibiotics Rocephin and Flagyl for now.  Metabolic acidosis With CO2 dropping and white blood cell count increasing.  Case discussed with general surgery and we will get a CT scan of the abdomen pelvis to rule out intra-abdominal infection.  Continue IV fluids.  Bowel obstruction_sigmoid colon Postoperative day 4.  Patient  also had cecal and transverse colon repair along with sigmoidectomy. Continue IV fluids and supportive care.  Empiric Flagyl and Rocephin.  With patient having low-grade temperature, metabolic acidosis and increased white count, general surgery ordering a CT scan of the abdomen pelvis to rule out intra-abdominal infection.  Abdominal pain Postoperative day 4.  Pain control.   Lymphedema Hold lasix   Hypokalemia Continue potassium in IV fluids today.  Recheck labs tomorrow.  LBBB (left bundle branch block) Patient has left bundle blockage on EKG. outpatient stress test which was negative in April 2023.  Asthma Stable -Bronchodilators  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.0 to 39.9 BMI 36.73 with recent height and weight in computer.   Hyponatremia Last sodium normal range at 136  Lung nodule Small pulmonary nodules measuring 4 mm or less in size is in the lung bases.  Will need follow-up with her oncology team as outpatient especially with history of lung cancer.  Sinus tachycardia IV metoprolol as needed  Hypoglycemia Resolved with adding D5 to fluids.        Subjective: Patient received a fluid bolus overnight secondary to hypotension.  Not feeling great.  Still having abdominal pain.  Has not urinated since Foley come out yesterday.  Bladder scan was actually on the lower side.  Increased IV fluids this morning and started midodrine.  Having output out of ostomy.  Physical Exam: Vitals:   01/23/22 2304 01/23/22 2313 01/24/22 0616 01/24/22 0802  BP:   97/62 95/62  Pulse: (!) 132 (!) 103 (!) 118 (!) 124  Resp:   18 16  Temp:  98 F (36.7 C) 99.4 F (37.4 C)  TempSrc:   Oral Axillary  SpO2:   96% 97%  Weight:      Height:       Physical Exam HENT:     Head: Normocephalic.     Mouth/Throat:     Pharynx: No oropharyngeal exudate.  Eyes:     General: Lids are normal.     Conjunctiva/sclera: Conjunctivae normal.  Cardiovascular:     Rate and Rhythm:  Regular rhythm. Tachycardia present.     Heart sounds: Normal heart sounds, S1 normal and S2 normal.  Pulmonary:     Breath sounds: No decreased breath sounds, wheezing, rhonchi or rales.  Abdominal:     General: There is distension.     Palpations: Abdomen is soft.     Tenderness: There is generalized abdominal tenderness.  Musculoskeletal:     Right lower leg: Swelling present.     Left lower leg: Swelling present.  Skin:    General: Skin is warm.     Findings: No rash.     Comments: Bruising right lower extremity and left lower extremity and right upper extremity.  Neurological:     Mental Status: She is alert and oriented to person, place, and time.     Data Reviewed: CO2 down to 16, white blood cell count up at 16.1, creatinine 0.65, platelet clumps on smear.  Family Communication: Updated patient's daughter on the phone  Disposition: Status is: Inpatient Remains inpatient appropriate because: General surgery to repeat CT scan to rule out intra-abdominal abscess.  Nursing staff to do an in and out catheterization to see if urinary retention Planned Discharge Destination: Rehab    Time spent: 32 minutes Case discussed with nursing staff and general surgery.  Author: Loletha Grayer, MD 01/24/2022 12:20 PM  For on call review [email or web address].

## 2022-01-24 NOTE — NC FL2 (Signed)
[place] LEVEL OF CARE SCREENING TOOL     IDENTIFICATION  Patient Name: Lindsey Thomas Birthdate: 21-Aug-1940 Sex: female Admission Date (Current Location): 01/18/2022  [facility] and [place] Number:  Engineering geologist and Address:         Provider Number: [id number]  Attending Physician Name and Address:  Loletha Grayer, MD  Relative Name and Phone Number:       Current Level of Care: Hospital Recommended Level of Care: [place] Prior Approval Number:    Date Approved/Denied:   PASRR Number: [id number]  Discharge Plan: SNF    Current Diagnoses: Patient Active Problem List   Diagnosis Date Noted   Metabolic acidosis 96/75/9163   Hypotension 01/24/2022   Sinus tachycardia 01/22/2022   Hypoglycemia 01/20/2022   Hypothyroidism    Hypokalemia    LBBB (left bundle branch block)    Bowel obstruction_sigmoid colon    Rectal bleeding 01/14/2022   Abdominal pain 01/14/2022   Nausea 01/14/2022   Hyponatremia 01/14/2022   Splenic artery aneurysm ([place]) 01/14/2022   GERD (gastroesophageal reflux disease)    Asthma    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.0 to 39.9    Lung nodule    Lower limb ulcer, calf, left, limited to breakdown of skin ([place]) 05/08/2021   Acute deep vein thrombosis (DVT) of proximal vein of left lower extremity ([place]) 01/01/2021   Tendinitis of upper biceps tendon of right shoulder 12/15/2020   Rotator cuff tendinitis, right 12/15/2020   Dyspnea and respiratory abnormalities 02/18/2020   Lung cancer ([place]) 12/03/2018   Varicose veins of leg with pain, 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  will advance prior to dc)  AMBULATORY STATUS COMMUNICATION OF NEEDS Skin   Extensive Assist Verbally Surgical wounds                       Personal Care Assistance Level of Assistance              Functional Limitations Info             SPECIAL CARE FACTORS FREQUENCY  PT (By licensed PT), OT (By licensed OT)                    Contractures Contractures Info: Not present    Additional Factors Info  Code Status, Allergies Code Status Info: Full Allergies Info: Eryc (Erythromycin), Levofloxacin, Percocet (Oxycodone-acetaminophen), Augmentin (Amoxicillin-pot Clavulanate), Celecoxib           Current Medications (01/24/2022):  This is the current hospital active medication list Current Facility-Administered Medications  Medication Dose Route Frequency Provider Last Rate Last Admin   acetaminophen (TYLENOL) tablet 650 mg  650 mg Oral Q6H PRN Herbert Pun, MD   650 mg at 01/24/22 0902   albuterol (PROVENTIL) (2.5 MG/3ML) 0.083% nebulizer solution 2.5 mg  2.5 mg Inhalation  Q4H PRN Herbert Pun, MD       cefTRIAXone (ROCEPHIN) 2 g in sodium chloride 0.9 % 100 mL IVPB  2 g Intravenous Q24H Loletha Grayer, MD   Stopped at 01/23/22 1844   Chlorhexidine Gluconate Cloth 2 % PADS 6 each  6 each Topical Daily Loletha Grayer, MD   6 each at 01/24/22 0902   dextromethorphan-guaiFENesin ([place] DM) 30-600 MG per 12 hr tablet 1 tablet  1 tablet Oral BID PRN Herbert Pun, MD       dextrose 5 % and 0.9 % NaCl with KCl 20 mEq/L infusion   Intravenous Continuous Loletha Grayer, MD 125 mL/hr at 01/24/22 0900 New Bag at 01/24/22 0900   enoxaparin (LOVENOX) injection 40 mg  40 mg Subcutaneous Q24H  Herbert Pun, MD   40 mg at 01/23/22 2101   fluticasone (FLONASE) 50 MCG/ACT nasal spray 2 spray  2 spray Each Nare Daily Herbert Pun, MD   2 spray at 01/23/22 1002   hydrALAZINE (APRESOLINE) injection 5 mg  5 mg Intravenous Q2H PRN Herbert Pun, MD   5 mg at 01/23/22 0129   iohexol (OMNIPAQUE) 9 MG/ML oral solution 500 mL  500 mL Oral Q1H Wieting, Richard, MD       lactated ringers bolus 250 mL  250 mL Intravenous Once Sharion Settler, NP 500 mL/hr at 01/24/22 0352 Restarted at 01/24/22 0352   metoprolol tartrate (LOPRESSOR) injection 5 mg  5 mg Intravenous Q4H PRN Sharion Settler, NP       metroNIDAZOLE (FLAGYL) IVPB 500 mg  500 mg Intravenous Q12H Loletha Grayer, MD 100 mL/hr at 01/24/22 0641 500 mg at 01/24/22 0641   midodrine (PROAMATINE) tablet 10 mg  10 mg Oral TID WC Wieting, Richard, MD       mometasone-formoterol [facility]) 200-5 MCG/ACT inhaler 2 puff  2 puff Inhalation BID Herbert Pun, MD   2 puff at 01/24/22 0907   montelukast (SINGULAIR) tablet 10 mg  10 mg Oral QHS Herbert Pun, MD   10 mg at 01/23/22 2100   morphine (PF) 2 MG/ML injection 2 mg  2 mg Intravenous Q4H PRN Ronny Bacon, MD   2 mg at 01/23/22 1942   multivitamin-lutein (OCUVITE-LUTEIN) capsule 1 capsule  1 capsule Oral Daily Herbert Pun, MD   1 capsule at 01/24/22 0902   ondansetron (ZOFRAN) injection 4 mg  4 mg Intravenous Q8H PRN Herbert Pun, MD   4 mg at 01/24/22 1305   pantoprazole (PROTONIX) EC tablet 40 mg  40 mg Oral Daily Herbert Pun, MD   40 mg at 01/24/22 6606   polyvinyl alcohol (LIQUIFILM TEARS) 1.4 % ophthalmic solution 1 drop  1 drop Both Eyes BID Herbert Pun, MD   1 drop at 01/24/22 3016     Discharge Medications: Please see discharge summary for a list of discharge medications.  Relevant Imaging Results:  Relevant Lab Results:   Additional Information ss [id number]  Beverly Sessions, RN

## 2022-01-24 NOTE — Anesthesia Preprocedure Evaluation (Addendum)
Anesthesia Evaluation  Patient identified by MRN, date of birth, ID band Patient awake    Reviewed: Allergy & Precautions, NPO status , Patient's Chart, lab work & pertinent test resultsPreop documentation limited or incomplete due to emergent nature of procedure.  History of Anesthesia Complications Negative for: history of anesthetic complications  Airway Mallampati: IV   Neck ROM: Full    Dental   Bridges :   Pulmonary asthma , sleep apnea , COPD,  NSCLC   Pulmonary exam normal breath sounds clear to auscultation       Cardiovascular hypertension, +CHF  Normal cardiovascular exam+ dysrhythmias  Rhythm:Regular Rate:Normal  Hx DVT  ECG 01/18/22: Sinus tachycardia (HR 125); LBBB  Echo 12/14/21:  NORMAL LEFT VENTRICULAR SYSTOLIC FUNCTION  NORMAL RIGHT VENTRICULAR SYSTOLIC FUNCTION  MODERATE VALVULAR REGURGITATION   NO VALVULAR STENOSIS  MODERATE AI- Pt1/2 408 ms; LVIDd 4.4 cm   Myocardial perfusion 09/16/21:  1. Poor exercise capacity  2. No ischemia noted on current study. Small fixed anterior defect likely consistent with attenuation artificat.  3. Normal LV systolic function   Neuro/Psych  Headaches, PSYCHIATRIC DISORDERS Anxiety    GI/Hepatic GERD  ,  Endo/Other  Hypothyroidism Obesity   Renal/GU negative Renal ROS     Musculoskeletal  (+) Arthritis ,   Abdominal (+) + obese,   Peds  Hematology negative hematology ROS (+)   Anesthesia Other Findings Pt s/p Surgery 01/20/2022 with sigmoid hemicolectomy, repair cecal perforation in transverse serosal tear and loop ileostomy creation now with abdominal pain.  CT scan as below: IMPRESSION: 1. Recent midline laparotomy with right lower quadrant loop ileostomy. 2. There is a moderate volume of free air in the abdomen. There is air adjacent to the sigmoid suture line, but also throughout the anterior abdomen, and scattered in the mesentery. It is unclear  if this represents residual postsurgical air or anastomotic leak. 3. Moderate volume of free fluid in the lower abdomen and pelvis. Majority of this fluid is not loculated, however is mildly complex in density. There may be developing peripheral enhancement involving fluid in the right pelvis with possible developing collection measuring 6.7 x 3.4 x 11.1 cm. 4. Two small linear subcapsular regions in the spleen, new from prior exam, suspicious for small splenic infarcts. No adjacent perisplenic fluid or subcapsular hematoma. 5. Diffuse small bowel enhancement, prominent fluid-filled proximal small bowel and distended stomach. Favor postoperative ileus, although the possibility of enteritis is also considered.  Pt tachycardic with recent metoprolol administration.  Cardiology note 09/01/21:  81 y.o. female with history of obesity, OSA, hypertension, hyperlipidemia, COPD, DVT on Xarelto.  # Preoperative evaluation She plans to undergo knee surgery (no date set), and wonders about her risk. She is unable to complete 4 METS of exertion, and has HTN, and advanced age. WE will complete stress testing for further risk stratification purposes.   #Essential hypertension Blood pressures been well controlled. She is not currently on any medications for this. -Low-salt diet encouraged.  #Shortness of breath #1.2 cm right upper lobe nodule s/p radiation She has chronic shortness of breath which there is no clear cardiac etiology. She underwent a chest x-ray for evaluation of her shortness of breath which showed a nodule in her right upper lobe for which a PET scan was concerning for malignancy. She was not a surgical candidate because of her low FEV1 (0.8 L) and thus underwent XRT with decreased size and this nodule. -Continue following with oncology and pulmonology  #Chronic lymphedema Somewhat stable with  compression stockings. She has had wounds in her lower extremities previously requiring an  Haematologist for healing. -Follows with wound clinic and vascular.  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 Completed therapy in November 2022.   Reproductive/Obstetrics                             Anesthesia Physical  Anesthesia Plan  ASA: 4 and emergent  Anesthesia Plan: General   Post-op Pain Management:    Induction: Intravenous and Rapid sequence  PONV Risk Score and Plan: 3 and Ondansetron, Dexamethasone and Treatment may vary due to age or medical condition  Airway Management Planned: Oral ETT  Additional Equipment: Arterial line  Intra-op Plan:   Post-operative Plan: Extubation in OR and Possible Post-op intubation/ventilation  Informed Consent: I have reviewed the patients History and Physical, chart, labs and discussed the procedure including the risks, benefits and alternatives for the proposed anesthesia with the patient or authorized representative who has indicated his/her understanding and acceptance.       Plan Discussed with: CRNA  Anesthesia Plan Comments:        Anesthesia Quick Evaluation

## 2022-01-24 NOTE — TOC Progression Note (Signed)
Transition of Care [facility]) - Progression Note    Patient Details  Name: Lindsey Thomas MRN: [id number] Date of Birth: 1940-10-23  Transition of [facility]) CM/SW Contact  Beverly Sessions, RN Phone Number: 01/24/2022, 1:24 PM  Clinical Narrative:     NG out.  Full liquid diet PASSR obtained Fl2 sent for signature Bed search initiated    Expected Discharge Plan: [place] Barriers to Discharge: Continued Medical Work up  Expected Discharge Plan and Services Expected Discharge Plan: [place]   Discharge Planning Services: CM Consult Post Acute Care Choice: [place] (vs home health if improved.) Living arrangements for the past 2 months: Single Family Home                                       Social Determinants of Health (SDOH) Interventions    Readmission Risk Interventions     No data to display

## 2022-01-24 NOTE — Progress Notes (Signed)
       CROSS COVER NOTE  NAME: Lindsey Thomas MRN: [id number] DOB : 1940/08/19    Date of Service   01/24/22  HPI/Events of Note   Message received from nursing reporting tachycardia and RR 7-9 on arrival from PACU.  On bedside assessment Lindsey Thomas is sleeping but arousable to voice. [place] 7. She is oriented and now breathing 10-12 times a minute. SPO2 100% on 2L nasal cannula. Breath sounds are clear to auscultation with good air movement.  Interventions   Plan: ABG--> pH 7.22 pCO2 48 pO2 60 Bicarb 19.6-->1 amp Bicarb Continue IVF and antibiotics CBC, BMP       This document was prepared using Dragon voice recognition software and may include unintentional dictation errors.  Neomia Glass DNP, MHA, FNP-BC Nurse Practitioner [facility] Pager [phone]

## 2022-01-24 NOTE — Anesthesia Procedure Notes (Signed)
Anesthesia Regional Block: TAP block (Bilateral)   Pre-Anesthetic Checklist: , timeout performed,  Correct Patient, Correct Site, Correct Laterality,  Correct Procedure, Correct Position, site marked,  Risks and benefits discussed,  Surgical consent,  Pre-op evaluation,  At surgeon's request and post-op pain management  Laterality: Left and Right  Prep: chloraprep       Needles:  Injection technique: Single-shot  Needle Type: Stimiplex     Needle Length: 9cm  Needle Gauge: 22     Additional Needles:   Procedures:,,,, ultrasound used (permanent image in chart),,    Narrative:  Start time: 01/24/2022 9:28 PM End time: 01/24/2022 9:40 PM Injection made incrementally with aspirations every 5 mL.  Performed by: Personally  Anesthesiologist: Iran Ouch, MD  Additional Notes: Patient consented for risk and benefits of nerve block including but not limited to nerve damage, failed block, bleeding and infection.  Patient voiced understanding.  Functioning IV was confirmed and monitors were applied.  Timeout done prior to procedure and prior to any sedation being given to the patient.  Patient confirmed procedure site prior to any sedation given to the patient. Sterile prep,hand hygiene and sterile gloves were used.  Minimal sedation used for procedure.  No paresthesia endorsed by patient during the procedure.  Negative aspiration and negative test dose prior to incremental administration of local anesthetic. The patient tolerated the procedure well with no immediate complications.

## 2022-01-24 NOTE — Op Note (Signed)
Preoperative diagnosis: Intestinal perforation  Postoperative diagnosis: Anastomosis leak.   Procedure: Subtotal colectomy with end ileostomy.   Anesthesia: GETA  Surgeon: Dr. Windell Moment, MD  Wound Classification: Contaminated  Indications: Patient is a 81 y.o. female had a sigmoid colectomy with repair of cecal perforation with loop ileostomy 4 days ago.  She has been presenting with low blood pressure and tachycardia.  CT scan of the abdomen and pelvis shows abundant amount of free fluid and free air.  Reopening of recent laparotomy indicated to rule out complication from previous surgery.  Findings:  1.  Anastomosis leak from colorectal anastomosis 2.  Pelvic abscess identified and drained.  Culture taken. 3.  No other source of complication identified.  Description of procedure: The patient was placed in the supine position and general endotracheal anesthesia was induced. Preoperative antibiotics were given. A Foley catheter and nasogastric tube were placed. The abdomen was prepped and draped in the usual sterile fashion. A time-out was completed verifying correct patient, procedure, site, positioning, and implant(s) and/or special equipment prior to beginning this procedure.  Staples removed and previous wound open at.  This was deepened through the subcutaneous tissues and hemostasis was achieved with electrocautery.  The PDS from the linea alba was cutted and the peritoneal cavity entered. The abdomen was explored.  Abundant amount of serous fluid was aspirated.  Pelvic abscess was identified and drained.  Stool content was seen to be leaking from colorectal anastomosis.  It was decided to proceed with total colectomy.  Initially, the right colon was mobilized by incising the peritoneal attachment from the cecum to the hepatic flexure. The colon and ileum was mobilized medially, taking care to protect the duodenum and right ureter. The greater omentum was then taken from the transverse  colon up to the splenic flexure. The lateral peritoneal attachment of the left colon was then incised from the splenic flexure to the sigmoid colon. The left colon was mobilized medially. Care was taken to protect the left ureter.  Points of division on the ileum proximal to the previous loop ileostomy and rectosigmoid colon were then chosen.  Loop ileostomy was taken down.  The ileum was divided with a linear cutting stapler.  The rectum was divided with control device.  The mesentery was scored and divided with the LigaSure device. The specimen was removed.  Hemostasis was checked. Attention was then turned to the right lower quadrant. The ileum was passed through th the previous ileostomy opening and determined to lie comfortably without tension.  The abdominal cavity was irrigated with 3 L of warm saline.  Two #19 French drains were left in the pelvis and right abdominal cavity.  The fascia was closed with a running suture of PDS 0.  A Penrose drain was left in the subcutaneous tissue.  The skin was closed with skin staples.  A dressing was placed over the incision and the ileostomy was matured with multiple interrupted sutures of 3-0 Vicryl placed full thickness through the edge of the ileum, catching a submucosal bite at the skin level and then suturing the ileum to the subcuticular level of the skin. A nicely everted ileostomy with good protrusion was thus produced. An ostomy bag was applied.  The patient tolerated the procedure well and was taken to the postanesthesia care unit in stable condition.   Specimen: Total colectomy  Complications: None  Estimated Blood Loss: 50 mL

## 2022-01-24 NOTE — Hospital Course (Addendum)
81 year old female with history of non-small cell lung cancer, hyperlipidemia, lymphedema, sleep apnea, COPD.  She presented back to the hospital with abdominal pain and distention.  Repeat CT scan showed increased dilatation of the colon until the mid sigmoid colon where there is abrupt change in colonic caliber consistent with sigmoid colonic obstruction.  Repeat flexible sigmoidoscopy showed a stricture in the descending colon.  Patient was brought to the operating room by Dr. Windell Moment and noted a serosal tear in the transverse colon and a perforation of the cecum was identified.  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  A sigmoidectomy was done, cecal perforation and serosal tear of the transverse colon was repaired.  Loop ileostomy creation.  The patient had a low-grade temperature on 01/21/2022 and empiric Rocephin and Flagyl was started.  The patient had some hypotension early morning on 01/24/2022 and was given a fluid bolus.  CO2 on the chemistry was lower at 16 and white count went up to 16.1.  Repeat CT scan of the abdomen pelvis ordered by Dr. Windell Moment.  The patient has not urinated since Foley was removed yesterday.  In and out catheterization to rule out retention.

## 2022-01-24 NOTE — Progress Notes (Signed)
PT Cancellation Note  Patient Details Name: Lindsey Thomas MRN: [id number] DOB: [birth year]   Cancelled Treatment:    Reason Eval/Treat Not Completed: Other (comment). Per discussion with team, pt limited by pain complaints this date, elevated HR and overall not feeling up for PT at this time. Encouraged to hold session and re-attempt next date.   Nalini Alcaraz 01/24/2022, 1:28 PM Greggory Stallion, PT, DPT, GCS [phone]

## 2022-01-24 NOTE — Progress Notes (Signed)
Patient ID: Lindsey Thomas, female   DOB: February 02, 1941, 81 y.o.   MRN: [id number]  Patient has not voided since foley removed. Bladder scan was 56 ml. IV infiltrated, fluds have been paused until we regain access. MD aware.  Haydee Salter, RN

## 2022-01-24 NOTE — Transfer of Care (Signed)
Immediate Anesthesia Transfer of Care Note  Patient: Lindsey Thomas  Procedure(s) Performed: EXPLORATORY LAPAROTOMY (Abdomen)  Patient Location: PACU  Anesthesia Type:General  Level of Consciousness: awake, alert  and oriented  Airway & Oxygen Therapy: Patient Spontanous Breathing and Patient connected to face mask oxygen  Post-op Assessment: Report given to RN, Post -op Vital signs reviewed and stable and Patient moving all extremities  Post vital signs: Reviewed and stable  Last Vitals:  Vitals Value Taken Time  BP 96/58 01/24/22 2052  Temp 36.2 C 01/24/22 2052  Pulse 51 01/24/22 2059  Resp 13 01/24/22 2100  SpO2 58 % 01/24/22 2059  Vitals shown include unvalidated device data.  Last Pain:  Vitals:   01/24/22 1620  TempSrc: Oral  PainSc:          Complications: No notable events documented.

## 2022-01-24 NOTE — Assessment & Plan Note (Addendum)
With CO2 dropping and white blood cell count increasing.  Case discussed with general surgery and we will get a CT scan of the abdomen pelvis to rule out intra-abdominal infection.  Continue IV fluids.

## 2022-01-24 NOTE — Progress Notes (Signed)
Patient ID: Lindsey Thomas, female   DOB: 1941-05-13, 81 y.o.   MRN: [id number]  Patient reevaluated today.  She continue with tachycardia and borderline low blood pressure.  Patient is alert, oriented x3.  There is tenderness on palpation in both lower quadrants.  Ileostomy is still having adequate output.  Even though there has been no fever and the ileostomy is working patient started to get nauseous and was unable to tolerate oral contrast.  Also today with elevated white blood cell count and metabolic acidosis on labs.  CT scan shows abundant amount of free fluid and free air.  Even though this could be expected postoperatively, in my opinion it is more free fluid and free air that I wanted to see 4 days postop.  I am concerned that this could be anastomosis leak or leaking from the perforation repair.  Patient with stable hemoglobin.  Low suspicious of bleeding.  I discussed with patient findings from CT scan and recommended to proceed with reopening of recent laparotomy to rule out any new perforation or complication from previous surgery.  I will take patient to the OR on emergent basis.  Patient and family member agreed to proceed with surgical intervention.  Herbert Pun, MD, FACS

## 2022-01-24 NOTE — Progress Notes (Signed)
Patient ID: Lindsey Thomas, female   DOB: Dec 29, 1940, 81 y.o.   MRN: [id number]   01/24/22 0802  Vitals  Temp 99.4 F (37.4 C)  Temp Source Axillary  BP 95/62  MAP (mmHg) 72  BP Location Left Arm  BP Method Automatic  Patient Position (if appropriate) Lying  Pulse Rate (!) 124  Pulse Rate Source Monitor  Resp 16  Level of Consciousness  Level of Consciousness Alert  MEWS COLOR  MEWS Score Color Yellow  Oxygen Therapy  SpO2 97 %  O2 Device Room Air  MEWS Score  MEWS Temp 0  MEWS Systolic 1  MEWS Pulse 2  MEWS RR 0  MEWS LOC 0  MEWS Score 3   Patient has not voided since foley removal, bladder scan 225. Received 1L bolus last night per night shift RN, yellow mews continued. MD aware.  Haydee Salter, RN

## 2022-01-24 NOTE — Assessment & Plan Note (Signed)
Improved after second surgery.  Decrease rate of IV fluid hydration.  Continue IV antibiotics Rocephin and Flagyl.  Likely secondary to abscess in abdomen.  Follow-up cultures.

## 2022-01-24 NOTE — Progress Notes (Signed)
Patient ID: Lindsey Thomas, female   DOB: [birth year], 81 y.o.   MRN: [id number]     [facility] Day(s): 6.   Interval History: Patient seen and examined, no acute events or new complaints overnight. Patient reports feeling pain on both bilateral lower flanks.  She denies any nausea or vomiting.  She was told that she had a fever.  Highest temp charted is 99.4.  She had Tylenol.  She has been tolerating clear liquid diet.  She had 1.4 L of ileostomy output.  Vital signs in last 24 hours: [min-max] current  Temp:  [98 F (36.7 C)-99.4 F (37.4 C)] 99.4 F (37.4 C) (08/20 0802) Pulse Rate:  [103-132] 124 (08/20 0802) Resp:  [16-18] 16 (08/20 0802) BP: (95-122)/(52-62) 95/62 (08/20 0802) SpO2:  [95 %-97 %] 97 % (08/20 0802)     Height: 4\' 9"  (144.8 cm) Weight: 77 kg BMI (Calculated): 36.72   Physical Exam:  Constitutional: alert, cooperative and no distress  Respiratory: breathing non-labored at rest  Cardiovascular: regular rate and sinus rhythm  Gastrointestinal: soft, non-tender, and non-distended.  The wound is dry and clean.  Lower portion of the wound is packed with iodoform packing.  Ileostomy is pink and patent.  Labs:     Latest Ref Rng & Units 01/23/2022    5:33 AM 01/22/2022    5:57 AM 01/21/2022    5:13 AM  CBC  WBC 4.0 - 10.5 K/uL 9.7  12.4  6.5   Hemoglobin 12.0 - 15.0 g/dL 10.2  10.9  10.7   Hematocrit 36.0 - 46.0 % 31.3  33.7  31.3   Platelets 150 - 400 K/uL 228  246  207       Latest Ref Rng & Units 01/23/2022    5:33 AM 01/21/2022    5:42 PM 01/21/2022    5:13 AM  CMP  Glucose 70 - 99 mg/dL 113  150  151   BUN 8 - 23 mg/dL 14  15  11    Creatinine 0.44 - 1.00 mg/dL 0.52  0.62  0.61   Sodium 135 - 145 mmol/L 139  138  135   Potassium 3.5 - 5.1 mmol/L 3.3  4.0  3.4   Chloride 98 - 111 mmol/L 111  108  107   CO2 22 - 32 mmol/L 23  23  23    Calcium 8.9 - 10.3 mg/dL 7.8  7.9  7.4     Imaging studies: No new pertinent imaging  studies   Assessment/Plan:  81 y.o. 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  -Patient has been dealing with low blood pressure and tachycardia.  Highest temp 99.4. -There is no nausea or vomiting.  Patient tolerated clear liquid diet. -Ileostomy output was 1.4 L -Agreed to keep high rate IV fluids to keep patient hydrated.  He does not unusual to cause significant dehydration with ileostomy output over a liter in this 81 year old patient.  Oriented the patient to drink plenty of water to stay hydrated.  I will advance diet to full liquids.  Will not start Imodium yet.  Usually when diet starts to get advance the ileostomy output decreases since it is less liquid. -Follow-up labs to rule out electrolyte disturbance, acute kidney injury. -If patient develops fever I will consider doing CT scan of the  abdomen and pelvis to rule out intra-abdominal infection which is not unexpected in postop patient after her complicated surgery with cecal perforation. -Continue IV antibiotic therapy. -I encouraged the patient to mobilize, get out of bed -Continue DVT prophylaxis -I will continue to follow closely.  Arnold Long, MD

## 2022-01-24 NOTE — Anesthesia Procedure Notes (Signed)
Procedure Name: Intubation Date/Time: 01/24/2022 5:58 PM  Performed by: Nelda Marseille, CRNAPre-anesthesia Checklist: Patient identified, Patient being monitored, Timeout performed, Emergency Drugs available and Suction available Patient Re-evaluated:Patient Re-evaluated prior to induction Oxygen Delivery Method: Circle system utilized Preoxygenation: Pre-oxygenation with 100% oxygen Induction Type: IV induction Ventilation: Mask ventilation without difficulty Laryngoscope Size: Mac, 3 and McGraph Grade View: Grade III Tube type: Oral Tube size: 7.0 mm Number of attempts: 1 Airway Equipment and Method: Stylet Placement Confirmation: ETT inserted through vocal cords under direct vision, positive ETCO2 and breath sounds checked- equal and bilateral Secured at: 21 cm Tube secured with: Tape Dental Injury: Teeth and Oropharynx as per pre-operative assessment

## 2022-01-24 NOTE — Progress Notes (Signed)
A consult was placed to the the hospital's IV Nurse for new iv access; pt has had multiple ivs over the last couple days; significant pitting edema noted in both hands, and arms; arms "tight" with fluid;  able to place a 20ga x 1.88" catheter in the left upper arm using ultrasound;  pt has extremely limited peripheral access due to excess fluid; pt encouraged to move her arms and elevate them when possible;

## 2022-01-24 NOTE — Progress Notes (Signed)
Cross Cover Data - Tachycardia, hypotension and no urine ousput with minimal volumes in bladder (per bladder scn)  Action - Total 1 liter of LR ordered  Response -

## 2022-01-24 NOTE — Progress Notes (Signed)
OT Cancellation Note  Patient Details Name: Lindsey Thomas MRN: [id number] DOB: 1940/08/02   Cancelled Treatment:    Reason Eval/Treat Not Completed: Patient declined, no reason specified;Pain limiting ability to participate. Upon attempt, pt declining OT 2/2 bilat hip/side pain. OT to re-attempt at later date/time.  Ardeth Perfect., MPH, MS, OTR/L ascom [phone] 01/24/22, 10:44 AM

## 2022-01-25 ENCOUNTER — Encounter: Payer: Self-pay | Admitting: General Surgery

## 2022-01-25 DIAGNOSIS — E872 Acidosis, unspecified: Secondary | ICD-10-CM | POA: Diagnosis not present

## 2022-01-25 DIAGNOSIS — I959 Hypotension, unspecified: Secondary | ICD-10-CM | POA: Diagnosis not present

## 2022-01-25 DIAGNOSIS — T8143XA Infection following a procedure, organ and space surgical site, initial encounter: Secondary | ICD-10-CM

## 2022-01-25 DIAGNOSIS — K56699 Other intestinal obstruction unspecified as to partial versus complete obstruction: Secondary | ICD-10-CM | POA: Diagnosis not present

## 2022-01-25 DIAGNOSIS — E875 Hyperkalemia: Secondary | ICD-10-CM

## 2022-01-25 LAB — BASIC METABOLIC PANEL
Anion gap: 7 (ref 5–15)
Anion gap: 6 (ref 5–15)
BUN: 16 mg/dL (ref 8–23)
BUN: 16 mg/dL (ref 8–23)
CO2: 18 mmol/L — ABNORMAL LOW (ref 22–32)
CO2: 20 mmol/L — ABNORMAL LOW (ref 22–32)
Calcium: 7.9 mg/dL — ABNORMAL LOW (ref 8.9–10.3)
Calcium: 7.8 mg/dL — ABNORMAL LOW (ref 8.9–10.3)
Chloride: 109 mmol/L (ref 98–111)
Chloride: 109 mmol/L (ref 98–111)
Creatinine, Ser: 0.75 mg/dL (ref 0.44–1.00)
Creatinine, Ser: 0.67 mg/dL (ref 0.44–1.00)
GFR, Estimated: >60 mL/min (ref >60)
GFR, Estimated: >60 mL/min (ref >60)
Glucose, Bld: 150 mg/dL — ABNORMAL HIGH (ref 70–99)
Glucose, Bld: 172 mg/dL — ABNORMAL HIGH (ref 70–99)
Potassium: 4.9 mmol/L (ref 3.5–5.1)
Potassium: 5.4 mmol/L — ABNORMAL HIGH (ref 3.5–5.1)
Sodium: 134 mmol/L — ABNORMAL LOW (ref 135–145)
Sodium: 135 mmol/L (ref 135–145)

## 2022-01-25 LAB — CBC
HCT: 34.8 % — ABNORMAL LOW (ref 36.0–46.0)
HCT: 32.4 % — ABNORMAL LOW (ref 36.0–46.0)
Hemoglobin: 10.6 g/dL — ABNORMAL LOW (ref 12.0–15.0)
Hemoglobin: 10.3 g/dL — ABNORMAL LOW (ref 12.0–15.0)
MCH: 29.4 pg (ref 26.0–34.0)
MCH: 29.4 pg (ref 26.0–34.0)
MCHC: 30.5 g/dL (ref 30.0–36.0)
MCHC: 31.8 g/dL (ref 30.0–36.0)
MCV: 96.7 fL (ref 80.0–100.0)
MCV: 92.6 fL (ref 80.0–100.0)
Platelets: 216 10*3/uL (ref 150–400)
Platelets: 199 10*3/uL (ref 150–400)
RBC: 3.6 MIL/uL — ABNORMAL LOW (ref 3.87–5.11)
RBC: 3.5 MIL/uL — ABNORMAL LOW (ref 3.87–5.11)
RDW: 15.1 % (ref 11.5–15.5)
RDW: 15.1 % (ref 11.5–15.5)
WBC: 16.7 10*3/uL — ABNORMAL HIGH (ref 4.0–10.5)
WBC: 15.9 10*3/uL — ABNORMAL HIGH (ref 4.0–10.5)
nRBC: 0 % (ref 0.0–0.2)
nRBC: 0.1 % (ref 0.0–0.2)

## 2022-01-25 LAB — BLOOD GAS, ARTERIAL
Acid-base deficit: 8.2 mmol/L — ABNORMAL HIGH (ref 0.0–2.0)
Bicarbonate: 19.6 mmol/L — ABNORMAL LOW (ref 20.0–28.0)
O2 Content: 2 L/min
O2 Saturation: 99.1 %
Patient temperature: 37
pCO2 arterial: 48 mmHg (ref 32–48)
pH, Arterial: 7.22 — ABNORMAL LOW (ref 7.35–7.45)
pO2, Arterial: 125 mmHg — ABNORMAL HIGH (ref 83–108)

## 2022-01-25 LAB — GLUCOSE, CAPILLARY: Glucose-Capillary: 160 mg/dL — ABNORMAL HIGH (ref 70–99)

## 2022-01-25 LAB — BLOOD GAS, VENOUS
Acid-base deficit: 3.3 mmol/L — ABNORMAL HIGH (ref 0.0–2.0)
Bicarbonate: 22.1 mmol/L (ref 20.0–28.0)
O2 Saturation: 94 %
Patient temperature: 37
pCO2, Ven: 40 mmHg — ABNORMAL LOW (ref 44–60)
pH, Ven: 7.35 (ref 7.25–7.43)
pO2, Ven: 63 mmHg — ABNORMAL HIGH (ref 32–45)

## 2022-01-25 LAB — SURGICAL PATHOLOGY

## 2022-01-25 MED ORDER — DEXTROSE-NACL 5-0.9 % IV SOLN: INTRAVENOUS | Status: DC

## 2022-01-25 MED ORDER — SODIUM BICARBONATE 8.4 % IV SOLN
50.0000 meq | Freq: Once | INTRAVENOUS | Status: AC
Start: 2022-01-25 — End: 2022-01-25
  Administered 2022-01-25: 50 meq via INTRAVENOUS
  Filled 2022-01-25: qty 50

## 2022-01-25 MED ORDER — BOOST / RESOURCE BREEZE PO LIQD CUSTOM
1.0000 | Freq: Three times a day (TID) | ORAL | Status: DC
  Administered 2022-01-25 – 2022-01-27 (×4): 1 via ORAL

## 2022-01-25 MED ORDER — ADULT MULTIVITAMIN W/MINERALS CH
1.0000 | ORAL_TABLET | Freq: Every day | ORAL | Status: DC
  Administered 2022-01-26 – 2022-01-30 (×5): 1 via ORAL
  Filled 2022-01-25 (×5): qty 1

## 2022-01-25 NOTE — Progress Notes (Signed)
Patient ID: Lindsey Thomas, female   DOB: 01-13-1941, 81 y.o.   MRN: [id number]     [facility] Day(s): 7.   Interval History: Patient seen and examined. "I am feeling OK, I guess". Patient was seen this morning looking comfortable. Post op pain expected. No nausea.   Patient had Subtotal colectomy last night due to leak from colorectal anastomosis.   Vital signs in last 24 hours: [min-max] current  Temp:  [97.2 F (36.2 C)-99.4 F (37.4 C)] 97.9 F (36.6 C) (08/21 0424) Pulse Rate:  [51-124] 98 (08/21 0424) Resp:  [7-20] 9 (08/21 0424) BP: (84-112)/(44-72) 108/51 (08/21 0424) SpO2:  [93 %-100 %] 100 % (08/21 0424)     Height: 4\' 9"  (144.8 cm) Weight: 77 kg BMI (Calculated): 36.72   Physical Exam:  Constitutional: alert, cooperative and no distress  Respiratory: breathing non-labored at rest  Cardiovascular: regular rate and sinus rhythm  Gastrointestinal: soft, non-tender, and no-distended. Ileostomy is pink and patent.   Labs:     Latest Ref Rng & Units 01/25/2022    5:45 AM 01/25/2022   12:50 AM 01/24/2022    8:14 PM  CBC  WBC 4.0 - 10.5 K/uL 15.9  16.7    Hemoglobin 12.0 - 15.0 g/dL 10.3  10.6  10.5   Hematocrit 36.0 - 46.0 % 32.4  34.8  31.0   Platelets 150 - 400 K/uL 199  216        Latest Ref Rng & Units 01/25/2022    5:45 AM 01/25/2022   12:50 AM 01/24/2022    8:14 PM  CMP  Glucose 70 - 99 mg/dL 172  150  129   BUN 8 - 23 mg/dL 16  16  13    Creatinine 0.44 - 1.00 mg/dL 0.67  0.75  0.60   Sodium 135 - 145 mmol/L 135  134  131   Potassium 3.5 - 5.1 mmol/L 5.4  4.9  4.5   Chloride 98 - 111 mmol/L 109  109  106   CO2 22 - 32 mmol/L 20  18    Calcium 8.9 - 10.3 mg/dL 7.8  7.9      Imaging studies: No new pertinent imaging studies   Assessment/Plan:  81 y.o. 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  Complicated with colorectal anastomosis 1 Day Post-Op s/p subtotal colectomy - This morning patient stable, better heart rate, no fever - Stable hemoglobin - End ileostomy is pink and patent - Will continue with NGT in place, IV hydration, IV abx. OK to get ice chips for comfort - I personally changed the dressings. I will remove penrose slowly daily.  - Continue DVT prophylaxis.  - I encourage the patient to engage with PT/OT  Arnold Long, MD

## 2022-01-25 NOTE — Evaluation (Addendum)
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  PMH significant for HTH, HLD, asthma, GERD, hypothyroidism, obesity obesity BMI 37.00, NSCLC (s/p of radiation therapy, no surgery or chemo), DVT not on AC, lymphedema, GI bleeding. Additional Sx performed on 8/20 for subtotal colectomy. Re-evaluation performed this date.   Clinical Impression   Pt seen for OT re-evaluation and tx this date. Pt/spouse agreeable to OT session, endorse pt recently worked with PT. Pt declining bed mobility 2/2 abdominal pain with movement. Pt noted with significant BUE edema and weeping. Pt/family educated in BUE positioning, skin breakdown and edema mgt. Pt completed gentle UB bathing with setup and MIN A for LUE bathing, also washed her face with set up. Pt appreciative. Pt repositioned with pillows to support BUE elevation and chuck pad overtop for weeping. Encouraged pt/family to monitor for saturation and let nursing know so that linens can be changed to support skin integrity and minimize breakdown. Pt/family instructed in BUE exercises to complete to improve strength. Pt continues to benefit from skilled OT services. Pt now requiring increased assist from previous evaluation for ADL and mobility. Continue to recommend SNF at this time to maximize pt's return to PLOF.       Recommendations for follow up therapy are one component of a multi-disciplinary discharge planning process, led by the attending physician.  Recommendations may be updated based on patient status, additional functional criteria and insurance authorization.   Follow Up Recommendations  Skilled nursing-short term rehab (<3 hours/day)     Assistance Recommended at Discharge Frequent or constant Supervision/Assistance  Patient can return home with the following Two people to help with walking and/or transfers;A lot of help with bathing/dressing/bathroom;Assistance with cooking/housework;Assist for transportation;Help with stairs or ramp for entrance;Direct supervision/assist for medications management    Functional Status Assessment  Patient has had a recent decline in their functional status and demonstrates the ability to make significant improvements in function in a reasonable and predictable amount of time.  Equipment Recommendations  BSC/3in1;Tub/shower seat    Recommendations for Other Services       Precautions / Restrictions Precautions Precautions: Fall Precaution Comments: new ostomy Restrictions Weight Bearing Restrictions: No      Mobility Bed Mobility               General bed mobility comments: Per chart, pt required +2 for rolling, too pain limited to attempt with OT this date    Transfers                   General transfer comment: unable      Balance                                           ADL either performed or assessed with clinical judgement   ADL Overall ADL's : Needs assistance/impaired     Grooming: Wash/dry hands;Wash/dry face;Bed level;Set up;Supervision/safety   Upper Body Bathing: Bed level;Minimal assistance Upper Body Bathing Details (indicate cue type and reason): MIN A for thoroughness for LUE bathing  Vision         Perception     Praxis      Pertinent Vitals/Pain Pain Assessment Pain Assessment: Faces Faces Pain Scale: Hurts even more Pain Location: abdomen with any bed mobility Pain Descriptors / Indicators: Discomfort, Grimacing Pain Intervention(s): Limited activity within patient's tolerance, Monitored during session, Repositioned, Premedicated before session     Hand  Dominance     Extremity/Trunk Assessment Upper Extremity Assessment Upper Extremity Assessment: Generalized weakness (BUE edema, weeping noted, hx L shoulder injury limiting AROM with shoulder flexion)   Lower Extremity Assessment Lower Extremity Assessment: Generalized weakness       Communication Communication Communication: No difficulties   Cognition Arousal/Alertness: Awake/alert Behavior During Therapy: WFL for tasks assessed/performed Overall Cognitive Status: Within Functional Limits for tasks assessed                                       General Comments  weeping edema present in B UE, RN notified.    Exercises Other Exercises Other Exercises: Pt/family educated in BUE positioning, skin breakdown and edema mgt Other Exercises: Pt/family instructed in BUE exercises   Shoulder Instructions      Home Living Family/patient expects to be discharged to:: Private residence Living Arrangements: Spouse/significant other Available Help at Discharge: Available 24 hours/day Type of Home: House Home Access: Stairs to enter [employer] of Steps: 2                   Home Equipment: Conservation officer, nature (2 wheels);Cane - single point          Prior Functioning/Environment Prior Level of Function : Independent/Modified Independent             Mobility Comments: pt reports amb with RW in house PRN, use of cane in community PRN ADLs Comments: MOD I in ADL/IADL per pt report        OT Problem List: Decreased strength;Decreased activity tolerance;Decreased knowledge of use of DME or AE;Pain;Impaired balance (sitting and/or standing);Increased edema      OT Treatment/Interventions: Self-care/ADL training;Patient/family education;Therapeutic exercise;Balance training;DME and/or AE instruction;Therapeutic activities    OT Goals(Current goals can be found in the care plan section) Acute Rehab OT Goals Patient Stated Goal: have less pain and  go home OT Goal Formulation: With patient/family Time For Goal Achievement: 02/08/22 Potential to Achieve Goals: Fair ADL Goals Pt Will Perform Grooming: sitting;with set-up;with supervision Pt Will Transfer to Toilet: stand pivot transfer;bedside commode;with max assist Pt Will Perform Toileting - Clothing Manipulation and hygiene: with mod assist;sitting/lateral leans  OT Frequency: Min 2X/week    Co-evaluation              AM-PAC OT "6 Clicks" Daily Activity     Outcome Measure Help from another person eating meals?: A Little Help from another person taking care of personal grooming?: A Little Help from another person toileting, which includes using toliet, bedpan, or urinal?: Total Help from another person bathing (including washing, rinsing, drying)?: A Lot Help from another person to put on and taking off regular upper body clothing?: A Lot Help from another person to put on and taking off regular lower body clothing?: A Lot 6 Click Score: 13   End of Session Nurse Communication: Mobility status;Other (comment) (positioning, edema mgt, request for assist with LB bathing this afternoon)  Activity Tolerance: Patient limited by pain Patient left: in bed;with call  bell/phone within reach;with bed alarm set;with family/visitor present  OT Visit Diagnosis: Muscle weakness (generalized) (M62.81)                Time: 1751-0258 OT Time Calculation (min): 27 min Charges:  OT General Charges $OT Visit: 1 Visit OT Evaluation $OT Re-eval: 1 Re-eval OT Treatments $Self Care/Home Management : 8-22 mins  Ardeth Perfect., MPH, MS, OTR/L ascom [phone] 01/25/22, 3:13 PM

## 2022-01-25 NOTE — Progress Notes (Signed)
Patient ID: Lindsey Thomas, female   DOB: [birth year], 81 y.o.   MRN: [id number]   Patient was reevaluated this afternoon.  There is intestinal content in the ileostomy bag.  There is adequate bowel sounds.  Abdomen is nondistended.  Patient without nausea.  Patient tolerated ice chips.  Vitals:   01/25/22 0424 01/25/22 0755  BP: (!) 108/51 (!) 118/36  Pulse: 98 (!) 106  Resp: (!) 9 20  Temp: 97.9 F (36.6 C) 98.5 F (36.9 C)  SpO2: 100% 100%     Patient continue with better heart rate.  Patient continue with better blood pressure.  Abdomen is soft and depressible.  Ileostomy pink and patent.  I clamped the NGT.  I will give clear liquid trial.  Herbert Pun, MD, FACS

## 2022-01-25 NOTE — Progress Notes (Signed)
Progress Note   Patient: Lindsey Thomas HYI:[id number] DOB: May 16, 1941 DOA: 01/18/2022     7 DOS: the patient was seen and examined on 01/25/2022   Brief hospital course: 81 year old female with history of non-small cell lung cancer, hyperlipidemia, lymphedema, sleep apnea, COPD.  She presented back to the hospital with abdominal pain and distention.  Repeat CT scan showed increased dilatation of the colon until the mid sigmoid colon where there is abrupt change in colonic caliber consistent with sigmoid colonic obstruction.  Repeat flexible sigmoidoscopy showed a stricture in the descending colon.  Patient was brought to the operating room by Dr. Windell Moment and noted a serosal tear in the transverse colon and a perforation of the cecum was identified.  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  A sigmoidectomy was done, cecal perforation and serosal tear of the transverse colon was repaired.  Loop ileostomy creation.  The patient had a low-grade temperature on 01/21/2022 and empiric Rocephin and Flagyl was started.  The patient had some hypotension early morning on 01/24/2022 and was given a fluid bolus.  CO2 on the chemistry was lower at 16 and white count went up to 16.1.  Repeat CT scan of the abdomen pelvis done on 01/26/2022 that showed a fluid collection and more free air.  Patient was brought back to the operating room for drainage of abscess and colectomy.  Assessment and Plan: * Hypotension Continue IV fluid hydration.  Continue IV antibiotics Rocephin and Flagyl.  Likely secondary to abscess in abdomen  Metabolic acidosis CO2 a little bit higher today than yesterday.  Continue IV fluids.  Likely secondary to intra-abdominal abscess  Bowel obstruction_sigmoid colon 01/20/2022 had a sigmoid colectomy, repair of cecum and transverse colon with ileostomy.  On 01/24/2022, the patient went back to the operating room for abscess drainage and colectomy.  Continue NG  tube and IV fluids.  General surgery following  Abdominal pain Postoperative day 1.  NG tube, pain control.  Lymphedema Hold lasix   Hypokalemia With hyperkalemia today we will stop potassium in IV fluids  LBBB (left bundle branch block) Patient has left bundle blockage on EKG. outpatient stress test which was negative in April 2023.  Asthma Stable -Bronchodilators  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.0 to 39.9 BMI 36.73 with height and weight in computer.   Hyponatremia Last sodium normal range at 135  Lung nodule Small pulmonary nodules measuring 4 mm or less in size is in the lung bases.  Will need follow-up with her oncology team as outpatient especially with history of lung cancer.  Sinus tachycardia IV metoprolol as needed  Hypoglycemia Resolved with adding D5 to fluids.        Subjective: Patient with abdominal pain.  NG tube back in.  Went back to the operating room last night for abscess and colectomy.  Physical Exam: Vitals:   01/25/22 0221 01/25/22 0341 01/25/22 0424 01/25/22 0755  BP: (!) 100/49 (!) 112/58 (!) 108/51 (!) 118/36  Pulse: (!) 102 (!) 103 98 (!) 106  Resp: 12 (!) 8 (!) 9 20  Temp:  98 F (36.7 C) 97.9 F (36.6 C) 98.5 F (36.9 C)  TempSrc:  Oral Oral Oral  SpO2: 100% 100% 100% 100%  Weight:      Height:       Physical Exam HENT:     Head: Normocephalic.     Mouth/Throat:     Pharynx: No oropharyngeal exudate.  Eyes:  General: Lids are normal.     Conjunctiva/sclera: Conjunctivae normal.  Cardiovascular:     Rate and Rhythm: Regular rhythm. Tachycardia present.     Heart sounds: Normal heart sounds, S1 normal and S2 normal.  Pulmonary:     Breath sounds: No decreased breath sounds, wheezing, rhonchi or rales.  Abdominal:     General: There is distension.     Palpations: Abdomen is soft.     Tenderness: There is generalized abdominal tenderness.  Musculoskeletal:     Right forearm: Swelling present.     Left  forearm: Swelling present.     Right lower leg: Swelling present.     Left lower leg: Swelling present.  Skin:    General: Skin is warm.     Findings: No rash.     Comments: Bruising right lower extremity and left lower extremity and right upper extremity.  Neurological:     Mental Status: She is alert and oriented to person, place, and time.     Data Reviewed: Sodium 135, potassium 5.4, CO2 28, creatinine 0.67, white blood cell count 15.9, hemoglobin 10.3  Family Communication: Spoke with patient's daughter  Disposition: Status is: Inpatient Remains inpatient appropriate because: Will need another 5 days here in the hospital  Planned Discharge Destination: Rehab    Time spent: 28 minutes Is discussed with nursing staff  Author: Loletha Grayer, MD 01/25/2022 3:09 PM  For on call review [email or web address].

## 2022-01-25 NOTE — Progress Notes (Signed)
Initial Nutrition Assessment  DOCUMENTATION CODES:   Obesity unspecified  INTERVENTION:   Boost Breeze po TID, each supplement provides 250 kcal and 9 grams of protein  MVI po daily   Pt at high refeed risk; recommend monitor potassium, magnesium and phosphorus labs daily until stable  Recommend TPN if unable to advance pt's diet in the next 1-2 days  Daily weights   NUTRITION DIAGNOSIS:   Inadequate oral intake related to acute illness as evidenced by other (comment) (pt on NPO/clear liquid diet since admission).  GOAL:   Patient will meet greater than or equal to 90% of their needs  MONITOR:   PO intake, Supplement acceptance, Labs, Weight trends, Skin, I & O's  REASON FOR ASSESSMENT:   NPO/Clear Liquid Diet    ASSESSMENT:   81 y.o. female with medical history significant of HTN, HLD, asthma, emphysema, GERD, hypothyroidism, obesity, OSA, NSCLC (s/p radiation therapy, no surgery or chemo), DVT not on AC, lymphedema and anxiety who is admitted with LBO secondary to stricture with cecal perforation s/p sigmoid hemicolectomy with mobilization of splenic flexure and primary anastomosis 7/40 complicated by anastamotic leak requiring subtotal colectomy with end ileostomy 8/20.  Met with pt in room today. Pt reports good appetite and oral intake at baseline. Pt reports decreased oral intake for several days pta r/t abdominal pain. Pt has remained on NPO/liquid diet since admission and is now without adequate nutrition for > 7 days. Pt reports that she is feeling better today. Pt reports less abdominal distension and nausea today. NGT in place with 640ml output; tube has now been clamped. Pt initiated on a clear liquid diet today. RD discussed with pt the importance of adequate nutrition needed to support post op healing. RD will add supplements and MVI to help pt meet her estimated needs. Pt is at high refeed risk. Recommend TPN if unable to advance pt's diet in the next 24-48 hrs.  Per chart, pt appears weight stable at baseline. Pt has not been weighed since admission; will request daily weights.   Medications reviewed and include: lovenox, protonix, ceftriaxone, NaCl w/ 5% dextrose $RemoveBef'@125ml'RhMmPUoRxb$ /hr, metronidazole   Labs reviewed: K 5.4(H) Wbc- 15.9(H), Hgb 10.3(L), Hct 32.4(L)  Drains 469ml output   NUTRITION - FOCUSED PHYSICAL EXAM:  Flowsheet Row Most Recent Value  Orbital Region No depletion  Upper Arm Region No depletion  Thoracic and Lumbar Region No depletion  Buccal Region No depletion  Temple Region No depletion  Clavicle Bone Region No depletion  Clavicle and Acromion Bone Region No depletion  Scapular Bone Region No depletion  Dorsal Hand No depletion  Patellar Region No depletion  Anterior Thigh Region No depletion  Posterior Calf Region No depletion  Edema (RD Assessment) Mild  Hair Reviewed  Eyes Reviewed  Mouth Reviewed  Skin Reviewed  Nails Reviewed   Diet Order:   Diet Order             Diet clear liquid Room service appropriate? Yes; Fluid consistency: Thin  Diet effective now                  EDUCATION NEEDS:   Education needs have been addressed  Skin:  Skin Assessment: Reviewed RN Assessment (incision abdomen)  Last BM:  8/18- type 6  Height:   Ht Readings from Last 1 Encounters:  01/18/22 $RemoveB'4\' 9"'CyCRLfHv$  (1.448 m)    Weight:   Wt Readings from Last 1 Encounters:  01/18/22 77 kg    Ideal Body Weight:  43 kg  BMI:  Body mass index is 36.73 kg/m.  Estimated Nutritional Needs:   Kcal:  1500-1700kcal/day  Protein:  75-85g/day  Fluid:  1.2-1.4L/day  Koleen Distance MS, RD, LDN Please refer to [facility] for RD and/or RD on-call/weekend/after hours pager

## 2022-01-25 NOTE — Evaluation (Signed)
Physical Therapy RE-Evaluation Patient Details Name: Lindsey Thomas MRN: [id number] DOB: [birth year] Today's Date: 01/25/2022  History of Present Illness  Pt is an 81 year old female with sigmoid stricture with obstruction and cecal perforation s/p sigmoidectomy with anastomosis and repair of cecal perforation with protective loop ileostomy creation 8/16.  PMH significant for HTH, HLD, asthma, GERD, hypothyroidism, obesity obesity BMI 37.00, NSCLC (s/p of radiation therapy, no surgery or chemo), DVT not on AC, lymphedema, GI bleeding. Additional Sx performed on 8/20 for subtotal colectomy. Re-evaluation performed this date.  Clinical Impression  Pt is a pleasant 81 year old female who was admitted for hypotension with complicated hospital stay. Pt re-evaluated this date secondary to additional Sx needing to be done including a colectomy on 01/24/22. Pt performs bed mobility with max assist +2 and is very effortful and uncomfortable. Pt declines further mobility attempts. Linen changed due to weeping/soiled. Extensive family education given and pt empowered to continue HEP to regain strength. Pt demonstrates deficits with strength/mobility/pain/endurance. Pt is far removed from baseline as she was able to stand at bedside last week. Would benefit from skilled PT to address above deficits and promote optimal return to PLOF; recommend transition to STR upon discharge from acute hospitalization.      Recommendations for follow up therapy are one component of a multi-disciplinary discharge planning process, led by the attending physician.  Recommendations may be updated based on patient status, additional functional criteria and insurance authorization.  Follow Up Recommendations Skilled nursing-short term rehab (<3 hours/day) Can patient physically be transported by private vehicle: No    Assistance Recommended at Discharge Frequent or constant Supervision/Assistance  Patient can return home with the  following  Two people to help with walking and/or transfers;A lot of help with bathing/dressing/bathroom;Assistance with cooking/housework;Assist for transportation;Help with stairs or ramp for entrance    Equipment Recommendations  (TBD)  Recommendations for Other Services       Functional Status Assessment Patient has had a recent decline in their functional status and demonstrates the ability to make significant improvements in function in a reasonable and predictable amount of time.     Precautions / Restrictions Precautions Precautions: Fall Precaution Comments: new ostomy Restrictions Weight Bearing Restrictions: No      Mobility  Bed Mobility Overal bed mobility: Needs Assistance Bed Mobility: Rolling Rolling: Max assist, +2 for physical assistance         General bed mobility comments: able to roll x 3 times, very effortful and painful. Very little effort given by patient. HR elevated to 115bpm with exertion.    Transfers                   General transfer comment: unable    Ambulation/Gait                  Stairs            Wheelchair Mobility    Modified Rankin (Stroke Patients Only)       Balance                                             Pertinent Vitals/Pain Pain Assessment Pain Assessment: Faces Faces Pain Scale: Hurts even more Pain Location: abdomen Pain Descriptors / Indicators: Discomfort, Grimacing Pain Intervention(s): Limited activity within patient's tolerance, Repositioned    Home Living Family/patient expects to be  discharged to:: Private residence Living Arrangements: Spouse/significant other Available Help at Discharge: Available 24 hours/day Type of Home: House Home Access: Stairs to enter   [employer] of Steps: 2     Home Equipment: Conservation officer, nature (2 wheels);Cane - single point      Prior Function Prior Level of Function : Independent/Modified Independent              Mobility Comments: pt reports amb with RW in house PRN, use of cane in community PRN ADLs Comments: MOD I in ADL/IADL per pt report     Hand Dominance        Extremity/Trunk Assessment   Upper Extremity Assessment Upper Extremity Assessment: Generalized weakness (B UE grossly 3/5)    Lower Extremity Assessment Lower Extremity Assessment: Generalized weakness (R LE grossly 3/5; L LE grossly 2/5)       Communication   Communication: No difficulties  Cognition Arousal/Alertness: Awake/alert Behavior During Therapy: WFL for tasks assessed/performed Overall Cognitive Status: Within Functional Limits for tasks assessed                                          General Comments General comments (skin integrity, edema, etc.): weeping edema present in B UE, RN notified.    Exercises Other Exercises Other Exercises: heavy education given regarding PT intervention and family education Other Exercises: HEP given and reviewed, written on white board. Ther-ex including including AP, glut sets, and SLRs. 10 reps performed with cga. Pt on 2L of O2, unable to obtain accurate reading   Assessment/Plan    PT Assessment Patient needs continued PT services  PT Problem List Decreased strength;Decreased range of motion;Decreased activity tolerance;Decreased balance;Decreased mobility;Decreased knowledge of use of DME;Decreased safety awareness;Pain;Cardiopulmonary status limiting activity       PT Treatment Interventions DME instruction;Gait training;Functional mobility training;Therapeutic activities;Therapeutic exercise;Balance training;Neuromuscular re-education;Patient/family education    PT Goals (Current goals can be found in the Care Plan section)  Acute Rehab PT Goals Patient Stated Goal: pt very much hopes to improve enough to go home PT Goal Formulation: With patient Time For Goal Achievement: 02/08/22 Potential to Achieve Goals: Fair    Frequency Min  2X/week     Co-evaluation               AM-PAC PT "6 Clicks" Mobility  Outcome Measure Help needed turning from your back to your side while in a flat bed without using bedrails?: Total Help needed moving from lying on your back to sitting on the side of a flat bed without using bedrails?: Total Help needed moving to and from a bed to a chair (including a wheelchair)?: Total Help needed standing up from a chair using your arms (e.g., wheelchair or bedside chair)?: Total Help needed to walk in hospital room?: Total Help needed climbing 3-5 steps with a railing? : Total 6 Click Score: 6    End of Session Equipment Utilized During Treatment: Oxygen (JP drains, NG tube) Activity Tolerance: Patient limited by pain Patient left: in bed;with bed alarm set;with family/visitor present (with RN in room) Nurse Communication: Mobility status PT Visit Diagnosis: Muscle weakness (generalized) (M62.81);Unsteadiness on feet (R26.81);Difficulty in walking, not elsewhere classified (R26.2);Pain Pain - part of body:  (abdominal)    Time: 2119-4174 PT Time Calculation (min) (ACUTE ONLY): 27 min   Charges:   PT Evaluation $PT Re-evaluation: 1 Re-eval PT Treatments $Therapeutic  Exercise: 8-22 mins        Greggory Stallion, PT, DPT, GCS [phone]   Lindsey Thomas 01/25/2022, 12:43 PM

## 2022-01-26 DIAGNOSIS — I959 Hypotension, unspecified: Secondary | ICD-10-CM | POA: Diagnosis not present

## 2022-01-26 DIAGNOSIS — E872 Acidosis, unspecified: Secondary | ICD-10-CM | POA: Diagnosis not present

## 2022-01-26 DIAGNOSIS — K56699 Other intestinal obstruction unspecified as to partial versus complete obstruction: Secondary | ICD-10-CM | POA: Diagnosis not present

## 2022-01-26 DIAGNOSIS — T8143XA Infection following a procedure, organ and space surgical site, initial encounter: Secondary | ICD-10-CM | POA: Diagnosis not present

## 2022-01-26 LAB — BASIC METABOLIC PANEL
Anion gap: 4 — ABNORMAL LOW (ref 5–15)
BUN: 15 mg/dL (ref 8–23)
CO2: 23 mmol/L (ref 22–32)
Calcium: 7.6 mg/dL — ABNORMAL LOW (ref 8.9–10.3)
Chloride: 110 mmol/L (ref 98–111)
Creatinine, Ser: 0.61 mg/dL (ref 0.44–1.00)
GFR, Estimated: >60 mL/min (ref >60)
Glucose, Bld: 207 mg/dL — ABNORMAL HIGH (ref 70–99)
Potassium: 4.1 mmol/L (ref 3.5–5.1)
Sodium: 137 mmol/L (ref 135–145)

## 2022-01-26 LAB — PHOSPHORUS: Phosphorus: 2.7 mg/dL (ref 2.5–4.6)

## 2022-01-26 LAB — GLUCOSE, CAPILLARY: Glucose-Capillary: 181 mg/dL — ABNORMAL HIGH (ref 70–99)

## 2022-01-26 LAB — MAGNESIUM: Magnesium: 1.6 mg/dL — ABNORMAL LOW (ref 1.7–2.4)

## 2022-01-26 LAB — SURGICAL PATHOLOGY

## 2022-01-26 MED ORDER — MAGNESIUM SULFATE 2 GM/50ML IV SOLN
2.0000 g | Freq: Once | INTRAVENOUS | Status: AC
  Administered 2022-01-26: 2 g via INTRAVENOUS
  Filled 2022-01-26: qty 50

## 2022-01-26 NOTE — Progress Notes (Signed)
Physical Therapy Treatment Patient Details Name: Lindsey Thomas MRN: [id number] DOB: 1940-08-22 Today's Date: 01/26/2022   History of Present Illness Pt is an 81 year old female with sigmoid stricture with obstruction and cecal perforation s/p sigmoidectomy with anastomosis and repair of cecal perforation with protective loop ileostomy creation 8/16.  PMH significant for HTH, HLD, asthma, GERD, hypothyroidism, obesity obesity BMI 37.00, NSCLC (s/p of radiation therapy, no surgery or chemo), DVT not on AC, lymphedema, GI bleeding. Additional Sx performed on 8/20 for subtotal colectomy. Re-evaluation performed this date.    PT Comments    Pt in bed on entry, reports ABD pain from rolling earlier. BUE supported on pillow and other adjacent items to absorb weeping from arms. Pt assisted with AA/ROM in BLE as well as gravity and author resisted exercises. Pt also instructed on A/ROM of LUE. Pt refuses sitting up at end of session, but si agreeable to elevate HOB later in day. ABD pain and concerns over disrupting her ostomy are both limitations in more mobility, however her gross motor weakness is the biggest limiter. PT would be for chipping away at these acute impairments.     Recommendations for follow up therapy are one component of a multi-disciplinary discharge planning process, led by the attending physician.  Recommendations may be updated based on patient status, additional functional criteria and insurance authorization.  Follow Up Recommendations  Skilled nursing-short term rehab (<3 hours/day) (CIR reports pt not a candidate at this time) Can patient physically be transported by private vehicle: No   Assistance Recommended at Discharge Frequent or constant Supervision/Assistance  Patient can return home with the following Two people to help with walking and/or transfers;A lot of help with bathing/dressing/bathroom;Assistance with cooking/housework;Assist for transportation;Help with  stairs or ramp for entrance   Equipment Recommendations  Wheelchair cushion (measurements PT);Wheelchair (measurements PT);Hospital bed;BSC/3in1;Other (comment) (hoyer lift for home)    Recommendations for Other Services       Precautions / Restrictions Precautions Precautions: Fall Precaution Comments: new ostomy, JP drains Restrictions Weight Bearing Restrictions: No     Mobility  Bed Mobility                    Transfers                        Ambulation/Gait                   Stairs             Wheelchair Mobility    Modified Rankin (Stroke Patients Only)       Balance                                            Cognition Arousal/Alertness: Awake/alert Behavior During Therapy: WFL for tasks assessed/performed Overall Cognitive Status: Within Functional Limits for tasks assessed                                          Exercises General Exercises - Upper Extremity Shoulder Flexion: AROM, Left, 15 reps, Supine Elbow Flexion: AROM, Left, 15 reps General Exercises - Lower Extremity Ankle Circles/Pumps: AROM, Supine, 5 reps, Limitations Ankle Circles/Pumps Limitations: performing ad lib prior to entry Short Arc Quad: AROM, Both,  10 reps, Supine, 20 reps Heel Slides: AAROM, 20 reps, Both, Supine Hip ABduction/ADduction: AAROM, Both, 10 reps, Supine Mini-Sqauts: Strengthening, Both, 10 reps, Supine, Limitations Mini Squats Limitations: manually resisted leg press    General Comments        Pertinent Vitals/Pain Pain Assessment Pain Assessment: 0-10 Pain Score: 5  Pain Location: RLQ Pain Descriptors / Indicators: Discomfort, Grimacing Pain Intervention(s): Limited activity within patient's tolerance, Monitored during session, Premedicated before session    Home Living                          Prior Function            PT Goals (current goals can now be found in the  care plan section) Acute Rehab PT Goals Patient Stated Goal: pt very much hopes to improve enough to go home PT Goal Formulation: With patient Time For Goal Achievement: 02/08/22 Potential to Achieve Goals: Fair Progress towards PT goals: PT to reassess next treatment    Frequency    Min 2X/week      PT Plan Current plan remains appropriate    Co-evaluation              AM-PAC PT "6 Clicks" Mobility   Outcome Measure  Help needed turning from your back to your side while in a flat bed without using bedrails?: Total Help needed moving from lying on your back to sitting on the side of a flat bed without using bedrails?: Total Help needed moving to and from a bed to a chair (including a wheelchair)?: Total Help needed standing up from a chair using your arms (e.g., wheelchair or bedside chair)?: Total Help needed to walk in hospital room?: Total Help needed climbing 3-5 steps with a railing? : Total 6 Click Score: 6    End of Session Equipment Utilized During Treatment: Oxygen Activity Tolerance: No increased pain;Patient tolerated treatment well;Patient limited by pain;Patient limited by fatigue Patient left: in bed;with family/visitor present;with SCD's reapplied Nurse Communication: Mobility status PT Visit Diagnosis: Muscle weakness (generalized) (M62.81);Unsteadiness on feet (R26.81);Difficulty in walking, not elsewhere classified (R26.2);Pain     Time: 1331-1407 PT Time Calculation (min) (ACUTE ONLY): 36 min  Charges:  $Therapeutic Exercise: 23-37 mins                    2:25 PM, 01/26/22 Etta Grandchild, PT, DPT Physical Therapist - [facility]  [phone] ([place])   [place] C 01/26/2022, 2:22 PM

## 2022-01-26 NOTE — Anesthesia Postprocedure Evaluation (Signed)
Anesthesia Post Note  Patient: Lindsey Thomas  Procedure(s) Performed: EXPLORATORY LAPAROTOMY (Abdomen)  Patient location during evaluation: PACU Anesthesia Type: General Level of consciousness: awake and alert Pain management: pain level controlled Vital Signs Assessment: post-procedure vital signs reviewed and stable Respiratory status: spontaneous breathing, nonlabored ventilation, respiratory function stable and patient connected to nasal cannula oxygen Cardiovascular status: blood pressure returned to baseline, stable and tachycardic Postop Assessment: no apparent nausea or vomiting Anesthetic complications: no   No notable events documented.   Last Vitals:  Vitals:   01/26/22 0448 01/26/22 0808  BP: (!) 123/92 121/66  Pulse: (!) 116 (!) 109  Resp: 18 16  Temp: 36.6 C 36.7 C  SpO2: 100% 98%    Last Pain:  Vitals:   01/26/22 0808  TempSrc: Oral  PainSc: 0-No pain                 [place] Ouch

## 2022-01-26 NOTE — Progress Notes (Signed)
Patient ID: Lindsey Thomas, female   DOB: 1941-04-26, 81 y.o.   MRN: [id number]   Patient evaluated this evening.  She denies any new complaint.  She has been tolerating clear liquid diet well.  She has been having adequate output through the ileostomy.  Vital signs continue to be stable.  Heart rate decreased to 101.  He has been no fever.  I personally remove the NGT tonight.  Plan is to advance diet to full liquids tomorrow morning.  Herbert Pun, MD, FACS

## 2022-01-26 NOTE — Assessment & Plan Note (Signed)
IV magnesium today

## 2022-01-26 NOTE — Progress Notes (Signed)
Dear Doctor:  This patient has been identified as a candidate for PICC for the following reason (s): poor veins/poor circulatory system (CHF, COPD, emphysema, diabetes, steroid use, IV drug abuse, etc.) If you agree, please write an order for the indicated device. For any questions contact the Vascular Access Team at [phone] if no answer, please leave a message.  Thank you for supporting the early vascular access assessment program.

## 2022-01-26 NOTE — Consult Note (Signed)
[facility] Nurse ostomy follow up Stoma type/location: RLQ, end ileostomy Stomal assessment/size: 1" x 1 3/8" oval shaped, minimally budded, pink, moist  Peristomal assessment: large ruputured blister from 5-7 oclock that is related to medical adhesive and tension of pouch placement directly after surgery (in OR) MARSI (medical adhesive related skin injury).  Not contact dermatitis because the affected area is limited to this distal aspect of the tape border  The midline wound is weeping serous fluid in moderate amounts from between the staples which is making pouch adherence very difficult. The staff had just changed pouched (2 hours or less ) prior to my arrival however it was leaking distally along the area of the skin injury and along the medial aspect of the tape border because the tape will not stick to the wet skin from all the midline drainage. Two penrose drains (one at the distal aspect of the wound bed draining serous moderate amounts, the pannus and under the pannus are saturated.  One penrose drain at the proximal aspect of the wound, not above skin level  Treatment options for stomal/peristomal skin: covered blistered area with cut to fit silver hydrofiber and piece of hydrocolloid I added silver hydrofiber along the open wound to absorb drainage and covered with ostomy border extenders along the tape edges of the ostomy barrier and covered the remainder of the incision with silver and foam to manage drainage. I have notified Dr. Windell Moment of the same.  Output: brown liquid stool  Ostomy pouching: 1pc. Flex convex Kellie Simmering # [phone]), I have ordered larger flex convex for patient's room Kellie Simmering # [phone]), using 2" skin barrier ring and ostomy belt  Education provided:  Demonstrated pouch change (cutting new skin barrier, measuring stoma, cleaning peristomal skin and stoma, use of barrier ring) Education on emptying when 1/3 to 1/2 full and how to empty Demonstrated use of wick to clean spout;  and made sure family understands rationale for cleaning to prevent stool seepage from edge of spout and also to lessen disruption of lock and roll closure (if contaminated with stool the velcro does not work as well) Discussed ostomy clinic and role after DC Provided clamp and demonstrated use (patient, husband and grandson) requested clamp demonstration.  I will take pouch with non lock and roll closure with my next visit if one available in [place]  Will provide patient and family with [place] catelog (marking items for use) and ostomy clinic information when I see them again tomorrow. Patient and family a little discouraged with leaking, we discussed trial and error with pouching due to the proxmiy of the wound and ostomy, the swelling and size of the patient's abdomen.  Patient is not that engaged in learning at this time, appears husband will be primary CG. Yolanda Bonine is very encouraging to patient to mobilize and get her the best care either in CIR or SNF or at home.  TOC staff in the room during a portion of my visit, patient's family is hoping for CIR however patient is not participating in PT currently. I have also encouraged her to mobilize as well.   Enrolled patient in [place] Start Discharge program: Yes Will plan to see patient again tomorrow, feel this ostomy will be a potential challenge to manage with the wound proximity and leaking/weeping of the abdomen.   [place] nursing team will follow along for wound and ostomy care needs.  [place], [place], [place]

## 2022-01-26 NOTE — Progress Notes (Signed)
Inpatient Rehab Admissions Coordinator:   Per therapy recommendations, patient was screened for CIR candidacy by Clemens Catholic, MS, CCC-SLP . At this time, Pt. is not tolerating OOB and not at a level to tolerate intensity of CIR. [employer] medicare is also unlikely to approve for this diagnosis.  Pt. may have potential to progress to becoming a potential CIR candidate, so CIR admissions team will follow and monitor for progress and participation with therapies and place consult order if Pt. appears to be an appropriate candidate. Please contact me with any questions.   Clemens Catholic, [place], [place] Admissions Coordinator  [phone] ([place]) [phone] (office)

## 2022-01-26 NOTE — Progress Notes (Signed)
Progress Note   Patient: Lindsey Thomas DDU:[id number] DOB: 08/05/1940 DOA: 01/18/2022     8 DOS: the patient was seen and examined on 01/26/2022   Brief hospital course: 81 year old female with history of non-small cell lung cancer, hyperlipidemia, lymphedema, sleep apnea, COPD.  She presented back to the hospital with abdominal pain and distention.  Repeat CT scan showed increased dilatation of the colon until the mid sigmoid colon where there is abrupt change in colonic caliber consistent with sigmoid colonic obstruction.  Repeat flexible sigmoidoscopy showed a stricture in the descending colon.  Patient was brought to the operating room by Dr. Windell Moment and noted a serosal tear in the transverse colon and a perforation of the cecum was identified.  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  A sigmoidectomy was done, cecal perforation and serosal tear of the transverse colon was repaired.  Loop ileostomy creation.  The patient had a low-grade temperature on 01/21/2022 and empiric Rocephin and Flagyl was started.  The patient had some hypotension early morning on 01/24/2022 and was given a fluid bolus.  CO2 on the chemistry was lower at 16 and white count went up to 16.1.  Repeat CT scan of the abdomen pelvis done on 01/24/2022 that showed a fluid collection and more free air.  Patient was brought back to the operating room for drainage of abscess and colectomy.  Assessment and Plan: * Hypotension Improved after second surgery.  Decrease rate of IV fluid hydration.  Continue IV antibiotics Rocephin and Flagyl.  Likely secondary to abscess in abdomen.  Follow-up cultures.  Metabolic acidosis CO2 now in normal range.  Improved after second surgery.  Bowel obstruction_sigmoid colon 01/20/2022 had a sigmoid colectomy, repair of cecum and transverse colon with ileostomy.  On 01/24/2022, the patient went back to the operating room for abscess drainage and colectomy.  NG  tube as per general surgery.  Patient started on clear liquid diet.  Abdominal pain Postoperative day 2 for second surgery.  NG tube as per general surgery, pain control.  Lymphedema Hold lasix   Hypokalemia Replaced into the normal range.  Was hyperkalemic yesterday and now in normal range again.  LBBB (left bundle branch block) Patient has left bundle blockage on EKG. outpatient stress test which was negative in April 2023.  Asthma Stable -Bronchodilators  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.0 to 39.9 BMI 36.26 with height and weight in computer.   Hyponatremia Last sodium normal range at 137  Lung nodule Small pulmonary nodules measuring 4 mm or less in size is in the lung bases.  Will need follow-up with her oncology team as outpatient especially with history of lung cancer.  Hypomagnesemia IV magnesium today  Sinus tachycardia IV metoprolol as needed  Hypoglycemia Resolved with adding D5 to fluids.        Subjective: Patient still having abdominal pain.  Postoperative day 2 for second surgery.  Initially had sigmoidectomy and cecal and transverse colon repair.  Second surgery was drainage of abscess and colectomy.  Physical Exam: Vitals:   01/25/22 1919 01/26/22 0448 01/26/22 0500 01/26/22 0808  BP: (!) 101/56 (!) 123/92  121/66  Pulse: (!) 115 (!) 116  (!) 109  Resp: 20 18  16   Temp: 98.4 F (36.9 C) 97.9 F (36.6 C)  98 F (36.7 C)  TempSrc: Oral Oral  Oral  SpO2: 99% 100%  98%  Weight:   76 kg   Height:  Physical Exam HENT:     Head: Normocephalic.     Mouth/Throat:     Pharynx: No oropharyngeal exudate.  Eyes:     General: Lids are normal.     Conjunctiva/sclera: Conjunctivae normal.  Cardiovascular:     Rate and Rhythm: Regular rhythm. Tachycardia present.     Heart sounds: Normal heart sounds, S1 normal and S2 normal.  Pulmonary:     Breath sounds: No decreased breath sounds, wheezing, rhonchi or rales.  Abdominal:      General: There is distension.     Palpations: Abdomen is soft.     Tenderness: There is generalized abdominal tenderness.  Musculoskeletal:     Right forearm: Swelling present.     Left forearm: Swelling present.     Right lower leg: Swelling present.     Left lower leg: Swelling present.  Skin:    General: Skin is warm.     Findings: No rash.     Comments: Bruising right lower extremity and left lower extremity and right upper extremity.  Neurological:     Mental Status: She is alert and oriented to person, place, and time.     Data Reviewed: CO2 up to 23, creatinine 0.61, sodium 137, magnesium 1.6  Family Communication: Spoke with patient's daughter at the bedside  Disposition: Status is: Inpatient Remains inpatient appropriate because: Postoperative day 2  Planned Discharge Destination: Rehab    Time spent: 27 minutes  Author: Loletha Grayer, MD 01/26/2022 11:24 AM  For on call review [email or web address].

## 2022-01-26 NOTE — Progress Notes (Signed)
Patient ID: Lindsey Thomas, female   DOB: 1940/11/05, 81 y.o.   MRN: [id number]     [facility] Hospital Day(s): 8.   Interval History: Patient seen and examined, no acute events or new complaints overnight. Patient reports feeling okay this morning.  She denies any nausea or vomiting.  She endorses she has been drinking small amount of fluids.  She endorses having a output through the ileostomy.  She denies chest pain or shortness of breath.  Continues to be sore in the lower abdomen.  Vital signs in last 24 hours: [min-max] current  Temp:  [97.8 F (36.6 C)-98.5 F (36.9 C)] 97.9 F (36.6 C) (08/22 0448) Pulse Rate:  [106-116] 116 (08/22 0448) Resp:  [18-20] 18 (08/22 0448) BP: (101-123)/(36-92) 123/92 (08/22 0448) SpO2:  [99 %-100 %] 100 % (08/22 0448) Weight:  [76 kg] 76 kg (08/22 0500)     Height: 4\' 9"  (144.8 cm) Weight: 76 kg BMI (Calculated): 36.25   Physical Exam:  Constitutional: alert, cooperative and no distress  Respiratory: breathing non-labored at rest  Cardiovascular: Tachycardia Gastrointestinal: soft, non-tender, and non-distended.  The wound is dry and clean.  Penrose on top and bottom of the wound.  Serous drainage.  Ileostomy pink and patent.  Labs:     Latest Ref Rng & Units 01/25/2022    5:45 AM 01/25/2022   12:50 AM 01/24/2022    8:14 PM  CBC  WBC 4.0 - 10.5 K/uL 15.9  16.7    Hemoglobin 12.0 - 15.0 g/dL 10.3  10.6  10.5   Hematocrit 36.0 - 46.0 % 32.4  34.8  31.0   Platelets 150 - 400 K/uL 199  216        Latest Ref Rng & Units 01/26/2022    4:16 AM 01/25/2022    5:45 AM 01/25/2022   12:50 AM  CMP  Glucose 70 - 99 mg/dL 207  172  150   BUN 8 - 23 mg/dL 15  16  16    Creatinine 0.44 - 1.00 mg/dL 0.61  0.67  0.75   Sodium 135 - 145 mmol/L 137  135  134   Potassium 3.5 - 5.1 mmol/L 4.1  5.4  4.9   Chloride 98 - 111 mmol/L 110  109  109   CO2 22 - 32 mmol/L 23  20  18    Calcium 8.9 - 10.3 mg/dL 7.6  7.8  7.9     Imaging studies: No new  pertinent imaging studies   Assessment/Plan:  81 y.o. 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   Complicated with colorectal anastomosis 2 Day Post-Op s/p subtotal colectomy -Patient with stable vital signs, again without any cardiac.  No fever - End ileostomy is pink and patent with ileostomy output -We will assess clear liquid diet toleration and possible removal of NGT later today. - I personally changed the dressings. I am slowly removing penrose daily.  - Continue DVT prophylaxis.  - I encourage the patient to engage with PT/OT  Arnold Long, MD

## 2022-01-26 NOTE — TOC Progression Note (Signed)
Transition of Care [facility]) - Progression Note    Patient Details  Name: Lindsey Thomas MRN: [id number] Date of Birth: 1940-07-28  Transition of Care [facility]) CM/SW Contact  Beverly Sessions, RN Phone Number: 01/26/2022, 4:37 PM  Clinical Narrative:    Met with patient, husband, and grandson at bedside to discuss bed offers.  Full note to follow    Expected Discharge Plan: [place] Barriers to Discharge: Continued Medical Work up  Expected Discharge Plan and Services Expected Discharge Plan: [place]   Discharge Planning Services: CM Consult Post Acute Care Choice: [facility] (vs home health if improved.) Living arrangements for the past 2 months: Single Family Home                                       Social Determinants of Health (SDOH) Interventions    Readmission Risk Interventions     No data to display

## 2022-01-26 NOTE — Progress Notes (Signed)
   01/24/22 2353  Assess: MEWS Score  BP 111/72  MAP (mmHg) 84  Pulse Rate (!) 111  Resp 12  SpO2 100 %  O2 Device Nasal Cannula  Assess: MEWS Score  MEWS Temp 0  MEWS Systolic 0  MEWS Pulse 2  MEWS RR 1  MEWS LOC 1  MEWS Score 4  MEWS Score Color Red  Assess: if the MEWS score is Yellow or Red  Were vital signs taken at a resting state? Yes  Focused Assessment Change from prior assessment (see assessment flowsheet) (very drowsy postop)  Does the patient meet 2 or more of the SIRS criteria? Yes  Does the patient have a confirmed or suspected source of infection? Yes  MEWS guidelines implemented *See Row Information* Yes  Treat  MEWS Interventions Other (Comment) (monitoring)  Pain Score Asleep  Take Vital Signs  Increase Vital Sign Frequency  Red: Q 1hr X 4 then Q 4hr X 4, if remains red, continue Q 4hrs  Escalate  MEWS: Escalate Red: discuss with charge nurse/RN and provider, consider discussing with RRT  Notify: Charge Nurse/RN  Name of Charge Nurse/RN Notified Actuary  Date Charge Nurse/RN Notified 01/25/22  Time Charge Nurse/RN Notified 0001  Notify: Provider  Provider Name/Title Neomia Glass NP  Date Provider Notified 01/25/22  Time Provider Notified 0001  Method of Notification Face-to-face  Notification Reason Change in status  Provider response No new orders  Date of Provider Response 01/25/22  Time of Provider Response 0010  Document  Patient Outcome Other (Comment) (slowly came around)  Progress note created (see row info) Yes  Assess: SIRS CRITERIA  SIRS Temperature  0  SIRS Pulse 1  SIRS Respirations  0  SIRS WBC 1  SIRS Score Sum  2

## 2022-01-27 DIAGNOSIS — K56699 Other intestinal obstruction unspecified as to partial versus complete obstruction: Secondary | ICD-10-CM | POA: Diagnosis not present

## 2022-01-27 DIAGNOSIS — E669 Obesity, unspecified: Secondary | ICD-10-CM | POA: Diagnosis not present

## 2022-01-27 DIAGNOSIS — I890 Lymphedema, not elsewhere classified: Secondary | ICD-10-CM | POA: Diagnosis not present

## 2022-01-27 DIAGNOSIS — T8143XA Infection following a procedure, organ and space surgical site, initial encounter: Secondary | ICD-10-CM | POA: Diagnosis not present

## 2022-01-27 LAB — BASIC METABOLIC PANEL
Anion gap: 5 (ref 5–15)
BUN: 12 mg/dL (ref 8–23)
CO2: 21 mmol/L — ABNORMAL LOW (ref 22–32)
Calcium: 7.8 mg/dL — ABNORMAL LOW (ref 8.9–10.3)
Chloride: 112 mmol/L — ABNORMAL HIGH (ref 98–111)
Creatinine, Ser: 0.43 mg/dL — ABNORMAL LOW (ref 0.44–1.00)
GFR, Estimated: >60 mL/min (ref >60)
Glucose, Bld: 108 mg/dL — ABNORMAL HIGH (ref 70–99)
Potassium: 4.7 mmol/L (ref 3.5–5.1)
Sodium: 138 mmol/L (ref 135–145)

## 2022-01-27 LAB — MAGNESIUM: Magnesium: 1.8 mg/dL (ref 1.7–2.4)

## 2022-01-27 MED ORDER — METOPROLOL TARTRATE 25 MG PO TABS
12.5000 mg | ORAL_TABLET | Freq: Two times a day (BID) | ORAL | Status: DC
  Administered 2022-01-27 – 2022-01-28 (×4): 12.5 mg via ORAL
  Filled 2022-01-27 (×4): qty 1

## 2022-01-27 MED ORDER — ASPIRIN 81 MG PO TBEC
81.0000 mg | DELAYED_RELEASE_TABLET | Freq: Every day | ORAL | Status: DC
  Administered 2022-01-27 – 2022-01-30 (×4): 81 mg via ORAL
  Filled 2022-01-27 (×4): qty 1

## 2022-01-27 MED ORDER — TRAMADOL HCL 50 MG PO TABS
50.0000 mg | ORAL_TABLET | Freq: Four times a day (QID) | ORAL | Status: DC | PRN
  Administered 2022-01-27 – 2022-01-30 (×5): 50 mg via ORAL
  Filled 2022-01-27 (×5): qty 1

## 2022-01-27 MED ORDER — ENSURE ENLIVE PO LIQD
237.0000 mL | Freq: Three times a day (TID) | ORAL | Status: DC
  Administered 2022-01-27 – 2022-01-30 (×11): 237 mL via ORAL

## 2022-01-27 MED ORDER — VITAMIN D 25 MCG (1000 UNIT) PO TABS
5000.0000 [IU] | ORAL_TABLET | Freq: Every day | ORAL | Status: DC
  Administered 2022-01-27 – 2022-01-30 (×4): 5000 [IU] via ORAL
  Filled 2022-01-27 (×4): qty 5

## 2022-01-27 MED ORDER — MAGNESIUM SULFATE 2 GM/50ML IV SOLN
2.0000 g | Freq: Once | INTRAVENOUS | Status: AC
  Administered 2022-01-27: 2 g via INTRAVENOUS
  Filled 2022-01-27: qty 50

## 2022-01-27 MED ORDER — FUROSEMIDE 20 MG PO TABS
20.0000 mg | ORAL_TABLET | Freq: Every day | ORAL | Status: DC
  Administered 2022-01-27 – 2022-01-30 (×4): 20 mg via ORAL
  Filled 2022-01-27 (×4): qty 1

## 2022-01-27 NOTE — TOC Progression Note (Signed)
Transition of Care [facility]) - Progression Note    Patient Details  Name: Lindsey Thomas MRN: [id number] Date of Birth: [birth year]  Transition of Care [facility]) CM/SW Contact  Beverly Sessions, RN Phone Number: 01/27/2022, 1:52 PM  Clinical Narrative:     With patient permission case discussed with daughter [name]. I notified [name] that at this time patient is not a candidate for CIR. She would like to hold the bed at [facility].  Accepted in hub and notified Tammy at [facility].  [name] to notify patients husband and son as well   Expected Discharge Plan: Skilled Nursing Facility Barriers to Discharge: Continued Medical Work up  Expected Discharge Plan and Services Expected Discharge Plan: [place]   Discharge Planning Services: CM Consult Post Acute Care Choice: [place] (vs home health if improved.) Living arrangements for the past 2 months: Single Family Home                                       Social Determinants of Health (SDOH) Interventions    Readmission Risk Interventions     No data to display

## 2022-01-27 NOTE — Progress Notes (Signed)
Acute change to discharge from L/JP drain, discharge now appears brown in color with foul odor.   Acute change reported to Center.   Will continue to monitor.

## 2022-01-27 NOTE — Consult Note (Signed)
PHARMACY CONSULT NOTE - FOLLOW UP  Pharmacy Consult for Electrolyte Monitoring and Replacement   Recent Labs: Potassium (mmol/L)  Date Value  01/27/2022 4.7  10/16/2012 3.6   Magnesium (mg/dL)  Date Value  01/27/2022 1.8   Calcium (mg/dL)  Date Value  01/27/2022 7.8 (L)   Calcium, Total (mg/dL)  Date Value  10/16/2012 8.6   Albumin (g/dL)  Date Value  01/18/2022 3.4 (L)  10/16/2012 3.4   Phosphorus (mg/dL)  Date Value  01/26/2022 2.7   Sodium (mmol/L)  Date Value  01/27/2022 138  10/16/2012 136   Assessment: POD7 s/p sigmoidectomy with anastomosis and repair of cecal perforation with protective loop ileostomy creation. Pharmacy consulted for electrolytes replacement.  Goal of Therapy:  K =/> 4, Mg =/> 2, all other electrolytes WNL  Plan:  Replace Mg with MgSulfate 2gm x 1 dose today Will follow up AM labs and replace electrolytes as needed.  Mal Asher Rodriguez-Guzman PharmD, BCPS 01/27/2022 11:26 AM

## 2022-01-27 NOTE — Progress Notes (Signed)
Nutrition Follow Up Note   DOCUMENTATION CODES:   Obesity unspecified  INTERVENTION:   Ensure Enlive po TID, each supplement provides 350 kcal and 20 grams of protein.  MVI po daily   Daily weights   Pt at high refeed risk; recommend monitor potassium, magnesium and phosphorus labs daily until stable  NUTRITION DIAGNOSIS:   Inadequate oral intake related to acute illness as evidenced by other (comment) (pt on NPO/clear liquid diet since admission).  GOAL:   Patient will meet greater than or equal to 90% of their needs -progressing   MONITOR:   PO intake, Supplement acceptance, Labs, Weight trends, Skin, I & O's  ASSESSMENT:   81 y.o. 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  NGT removed. Pt advanced to a full liquid diet 8/22. Pt with fair appetite and oral intake in hospital. Pt ate most of her breakfast, a few bites of lunch and drank and Ensure today. Pt remains at high refeed risk; recommend monitor electrolytes. Pt is having stool via her ostomy. Per chart, pt down ~2lbs since admission. Plan for for SNF at discharge.   Medications reviewed and include: aspirin, vitamin D3, lovenox, lasix, MVI, protonix, ceftriaxone, NaCl w/ 5% dextrose @75ml /hr, metronidazole   Labs reviewed: K 4.7 wnl, creat 0.43(L), Mg 1.8 wnl P 2.7 wnl- 8/22 Wbc- 15.9(H), Hgb 10.3(L), Hct 32.4(L) Cbgs- 181, 160 x 48 hrs   Drains 1430ml output   Diet Order:   Diet Order             Diet full liquid Room service appropriate? Yes; Fluid consistency: Thin  Diet effective now                   EDUCATION NEEDS:   Education needs have been addressed  Skin:  Skin Assessment: Reviewed RN Assessment (incision abdomen)  Last BM:  8/23- 549ml via ostomy  Height:   Ht Readings from Last 1 Encounters:  01/18/22 4\' 9"  (1.448 m)    Weight:   Wt Readings from Last 1 Encounters:  01/27/22 76 kg    Ideal Body Weight:  43 kg  BMI:  Body mass index is 36.26 kg/m.  Estimated Nutritional Needs:   Kcal:  1500-1700kcal/day  Protein:  75-85g/day  Fluid:  1.2-1.4L/day  Koleen Distance MS, RD, LDN Please refer to [facility] for RD and/or RD on-call/weekend/after hours pager

## 2022-01-27 NOTE — Consult Note (Signed)
[place] Nurse ostomy follow up Stoma type/location: RLQ, ileostomy  Stomal assessment/size: 1" x 1 3/8" oval; slightly budded, pink, moist Peristomal assessment: NA Treatment options for stomal/peristomal skin: used 2" skin barrier ring with pouching 8/22 Output liquid brown/green Ostomy pouching: 1pc. Flex convex with 2" skin barrier ring; belt  Education provided: family not in the room, patient getting ready to get out of bed with PT, essentially visited to follow up on wound and ostomy pouching today. No teaching performed.  I will follow up Thur/Friday to determine when next pouch change need.  Explained that good wear time of 3 days would be perfect. She is much more engaged today and looks so much better. Encouraged her to work hard with PT Enrolled patient in [place] Start Discharge program: Yes  [place] Nurse will follow along with you for continued support with ostomy teaching and care [place] MSN, [place], [place], [place], [place]

## 2022-01-27 NOTE — Progress Notes (Signed)
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  PMH significant for HTH, HLD, asthma, GERD, hypothyroidism, obesity obesity BMI 37.00, NSCLC (s/p of radiation therapy, no surgery or chemo), DVT not on AC, lymphedema, GI bleeding. Additional Sx performed on 8/20 for subtotal colectomy. Re-evaluation performed this date.    PT Comments    Pt was supine in bed with HOB elevated ~ 20 degrees upon arriving. She is agreeable and motivated for PT session however somewhat lethargic throughout. " I know I got to do it." Pt does endorse wanting to get OOB to [facility]. She was unable to achieve goal and requires max assist to progress to short sit EOB. Prolong time spent sitting EOB. Pt has severe posterior push and required constant encouragement and assist to prevent posterior LOB while having all extremity support. Highly recommend DC to rehab to address deficits while maximizing independence. Acute PT will continue to follow and progress as able per pt tolerance. Pt was on O2 upon arriving however removed throughout session with sao2 >95%.RN made aware.     Recommendations for follow up therapy are one component of a multi-disciplinary discharge planning process, led by the attending physician.  Recommendations may be updated based on patient status, additional functional criteria and insurance authorization.  Follow Up Recommendations  Skilled nursing-short term rehab (<3 hours/day)     Assistance Recommended at Discharge Frequent or constant Supervision/Assistance  Patient can return home with the following Two people to help with walking and/or transfers;A lot of help with  bathing/dressing/bathroom;Assistance with cooking/housework;Assist for transportation;Help with stairs or ramp for entrance   Equipment Recommendations  Other (comment) (Highly recommend DC to rehab however if pt/family elects to DC home will need hospital bed, hoyer lift, w/c+ w/c cushion to maximize safety.)       Precautions / Restrictions Precautions Precautions: Fall Precaution Comments: new ostomy, JP drains x 2 Restrictions Weight Bearing Restrictions: No     Mobility  Bed Mobility Overal bed mobility: Needs Assistance Bed Mobility: Rolling, Sidelying to Sit, Supine to Sit, Sit to Supine Rolling: Max assist Sidelying to sit: Max assist Supine to sit: Max assist Sit to supine: Max assist   General bed mobility comments: Pt required extensive assistance to achieve EOB short sit and then to return to supine after sitting EOB x ~ 20 minutes    Transfers    General transfer comment: unsafe to attempt standing 2/2 to severe posterior push in sitting. worked on balance and strengthening while seated EOB     Balance Overall balance assessment: Needs assistance Sitting-balance support: Feet supported, Bilateral upper extremity supported Sitting balance-Leahy Scale: Poor Sitting balance - Comments: pt presents with posterior push. Vcs through+ assistance to prevent falling backwards. pt performed several exercises to promote fwd wt shift. unsafe to attempt standing due to severity of posterior push       Cognition Arousal/Alertness: Lethargic, Suspect due to medications Behavior During Therapy: WFL for tasks assessed/performed Overall Cognitive Status: Within Functional Limits for tasks assessed      General Comments: Pt was somewhat lkethargic during session however cooperative and motivated. She requested to attempt OOB to [facility].           General Comments General comments (skin integrity, edema, etc.):  pt performed severalk exercises while seated EOB and in bed prior to  getting to EOB. Encouraged pt continue to increased exercises throughout the day to promote return in strength/abilities      Pertinent Vitals/Pain Pain Assessment Pain Assessment: 0-10 Pain Score: 7  Faces Pain Scale: Hurts little more Pain Descriptors / Indicators: Discomfort, Grimacing Pain Intervention(s): Limited activity within patient's tolerance, Monitored during session, Premedicated before session, Repositioned     PT Goals (current goals can now be found in the care plan section) Acute Rehab PT Goals Patient Stated Goal: get better Progress towards PT goals: Not progressing toward goals - comment (pt is severely deconditioned and weak)    Frequency    Min 2X/week      PT Plan Current plan remains appropriate       AM-PAC PT "6 Clicks" Mobility   Outcome Measure  Help needed turning from your back to your side while in a flat bed without using bedrails?: A Lot Help needed moving from lying on your back to sitting on the side of a flat bed without using bedrails?: A Lot Help needed moving to and from a bed to a chair (including a wheelchair)?: Total Help needed standing up from a chair using your arms (e.g., wheelchair or bedside chair)?: Total Help needed to walk in hospital room?: Total Help needed climbing 3-5 steps with a railing? : Total 6 Click Score: 8    End of Session Equipment Utilized During Treatment:  (removed O2 with sao2 > 95% throughout. RN aware. pt uses c-pap at night @ baseline.) Activity Tolerance: Patient limited by pain;Patient limited by fatigue;Patient limited by lethargy Patient left: in bed;with family/visitor present;with SCD's reapplied Nurse Communication: Mobility status PT Visit Diagnosis: Muscle weakness (generalized) (M62.81);Unsteadiness on feet (R26.81);Difficulty in walking, not elsewhere classified (R26.2);Pain     Time: 5102-5852 PT Time Calculation (min) (ACUTE ONLY): 44 min  Charges:  $Therapeutic Exercise: 8-22  mins $Therapeutic Activity: 23-37 mins                     Julaine Fusi PTA 01/27/22, 5:15 PM

## 2022-01-27 NOTE — Progress Notes (Signed)
Patient ID: Lindsey Thomas, female   DOB: July 30, 1940, 81 y.o.   MRN: [id number]     [place] Hospital Day(s): 9.   Interval History: Patient seen and examined, no acute events or new complaints overnight. Patient reports having difficulty sleeping.  Denies any new complaint.  She is tolerating diet.  Ileostomy working adequately.  Vital signs in last 24 hours: [min-max] current  Temp:  [97.7 F (36.5 C)-98.7 F (37.1 C)] 98.3 F (36.8 C) (08/23 0259) Pulse Rate:  [101-110] 108 (08/23 0259) Resp:  [16-20] 20 (08/23 0259) BP: (121-137)/(56-71) 137/63 (08/23 0259) SpO2:  [98 %-100 %] 98 % (08/23 0259) Weight:  [76 kg] 76 kg (08/23 0500)     Height: 4\' 9"  (144.8 cm) Weight: 76 kg BMI (Calculated): 36.25   Physical Exam:  Constitutional: alert, cooperative and no distress  Respiratory: breathing non-labored at rest  Cardiovascular: regular rate and sinus rhythm  Gastrointestinal: soft, non-tender, and non-distended.  Midline wound with serous output through top and bottom opening.  I remove the Penrose the top portion.  Wet-to-dry packing with gauze.  Labs:     Latest Ref Rng & Units 01/25/2022    5:45 AM 01/25/2022   12:50 AM 01/24/2022    8:14 PM  CBC  WBC 4.0 - 10.5 K/uL 15.9  16.7    Hemoglobin 12.0 - 15.0 g/dL 10.3  10.6  10.5   Hematocrit 36.0 - 46.0 % 32.4  34.8  31.0   Platelets 150 - 400 K/uL 199  216        Latest Ref Rng & Units 01/26/2022    4:16 AM 01/25/2022    5:45 AM 01/25/2022   12:50 AM  CMP  Glucose 70 - 99 mg/dL 207  172  150   BUN 8 - 23 mg/dL 15  16  16    Creatinine 0.44 - 1.00 mg/dL 0.61  0.67  0.75   Sodium 135 - 145 mmol/L 137  135  134   Potassium 3.5 - 5.1 mmol/L 4.1  5.4  4.9   Chloride 98 - 111 mmol/L 110  109  109   CO2 22 - 32 mmol/L 23  20  18    Calcium 8.9 - 10.3 mg/dL 7.6  7.8  7.9     Imaging studies: No new pertinent imaging studies   Assessment/Plan:  81 y.o. female with sigmoid stricture with obstruction and cecal  perforation 7 Day Post-Op s/p sigmoidectomy with anastomosis and repair of cecal perforation with protective loop ileostomy creation, complicated by pertinent comorbidities including hypertension, hyperlipidemia, asthma, GERD, hypothyroidism.   Complicated with colorectal anastomosis 3 Day Post-Op s/p subtotal colectomy -Patient with stable vital signs, heart rate slowly started rising within 101-110.  No fever - End ileostomy is pink and patent with ileostomy output -Tolerating clear liquid diet.  Will advance to full liquids and assess for toleration -Pelvic drain (left lower quadrant) with suspected stoolish output.  I cleaned the tubing and milk the drain and now back to serous.  This could be residual stool from previous anastomosis leakage versus rectal stump leakage.  Either way should resolve spontaneously.  We will continue monitoring. - I personally changed the dressings. I am slowly removing penrose daily.  - Continue DVT prophylaxis.   Severely debilitated patient - I encourage the patient to engage with PT/OT -Advancing diet for better nutrition  Hypertension -This seems to have been resolved.  Patient with better blood pressure.  Not on midodrine anymore.  Agree with IV fluid hydration.  Hopefully should be able to slowly decrease IV fluid once patient started tolerating very diet. -Continue IV antibiotic therapy  Lymphedema -May restart Lasix as per primary team once they feel that the blood pressure has been stabilized with current medical management  Arnold Long, MD

## 2022-01-27 NOTE — Progress Notes (Signed)
[facility] NAME: Lindsey Thomas    MR#:  [id number]  DATE OF BIRTH:  05-08-1941  SUBJECTIVE:   Patient's daughter and sister at bedside. Tolerated full liquid diet today. General surgery Dr. Windell Moment changed pressings on the drains. Patient working with physical therapy appears quite deconditioned. Needs lot of help.   VITALS:  Blood pressure 122/66, pulse (!) 106, temperature 98.2 F (36.8 C), temperature source Oral, resp. rate 16, height 4\' 9"  (1.448 m), weight 76 kg, SpO2 100 %.  PHYSICAL EXAMINATION:   GENERAL:  81 y.o.-year-old patient lying in the bed with no acute distress. Obese LUNGS: Normal breath sounds bilaterally, no wheezing, rales, rhonchi.  CARDIOVASCULAR: S1, S2 normal. No murmurs, rubs, or gallops. Tachycardia ABDOMEN: Soft, right-sided ileostomy and Penrose drain  EXTREMITIES: upper and lower extremity edema dependent    NEUROLOGIC: nonfocal  patient is alert and awake SKIN: per RN  LABORATORY PANEL:  CBC Recent Labs  Lab 01/25/22 0545  WBC 15.9*  HGB 10.3*  HCT 32.4*  PLT 199    Chemistries  Recent Labs  Lab 01/27/22 0946  NA 138  K 4.7  CL 112*  CO2 21*  GLUCOSE 108*  BUN 12  CREATININE 0.43*  CALCIUM 7.8*  MG 1.8    Assessment and Plan 81 year old female with history of non-small cell lung cancer, hyperlipidemia, lymphedema, sleep apnea, COPD.  She presented back to the hospital with abdominal pain and distention.  Repeat CT scan showed increased dilatation of the colon until the mid sigmoid colon where there is abrupt change in colonic caliber consistent with sigmoid colonic obstruction   Hypotension --Improved after second surgery.   --Decrease rate of IV fluid hydration. --  Continue IV antibiotics Rocephin and Flagyl.  Likely secondary to abscess in abdomen.   --Follow-up cultures-- few WBC, few PMN's --no fever   Metabolic acidosis --CO2 now in normal range.  Improved  after second surgery.   Bowel obstruction_sigmoid colon --01/20/2022 had a sigmoid colectomy, repair of cecum and transverse colon with ileostomy.   --On 01/24/2022, the patient went back to the operating room for abscess drainage and colectomy. --  NG tube as per general surgery--now removed -- Patient started on clear liquid diet--advance diet as tolerated   Lymphedema --will resume lasix    Hypokalemia --Replaced into the normal range.   --k 4.7   LBBB (left bundle branch block) --Patient has left bundle blockage on EKG. outpatient stress test which was negative in April 2023.   Asthma -Bronchodilators   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.0 to 39.9 BMI 36.26 with height and weight in computer.  Hyponatremia Last sodium normal range at 137 resolved   Lung nodule Small pulmonary nodules measuring 4 mm or less in size is in the lung bases.  Will need follow-up with her oncology team as outpatient especially with history of lung cancer.   Hypomagnesemia Mag 1.8   Sinus tachycardia IV metoprolol as needed Start low dose metoprolol 12.5 mg bid since bp ok   Hypoglycemia Resolved   TOC for d/c planning to rehab.  Procedures s/p sigmoidectomy with anastomosis and repair of cecal perforation with protective loop ileostomy creation Family communication :dter [name] Consults :gen surgery CODE STATUS: FULL DVT Prophylaxis :enoxaparin Level of care: Med-Surg Status is: Inpatient Remains inpatient appropriate because: complex surgical issue--improving/TOC working on Rehab bed    TOTAL TIME TAKING CARE OF THIS PATIENT: 35 minutes.  >50% time  spent on counselling and coordination of care  Note: This dictation was prepared with Dragon dictation along with smaller phrase technology. Any transcriptional errors that result from this process are unintentional.  [name] M.D    [facility]   CC: Primary care physician; Idelle Crouch, MD

## 2022-01-28 DIAGNOSIS — I890 Lymphedema, not elsewhere classified: Secondary | ICD-10-CM | POA: Diagnosis not present

## 2022-01-28 DIAGNOSIS — T8143XA Infection following a procedure, organ and space surgical site, initial encounter: Secondary | ICD-10-CM | POA: Diagnosis not present

## 2022-01-28 DIAGNOSIS — E669 Obesity, unspecified: Secondary | ICD-10-CM | POA: Diagnosis not present

## 2022-01-28 DIAGNOSIS — K56699 Other intestinal obstruction unspecified as to partial versus complete obstruction: Secondary | ICD-10-CM | POA: Diagnosis not present

## 2022-01-28 LAB — MAGNESIUM: Magnesium: 2 mg/dL (ref 1.7–2.4)

## 2022-01-28 LAB — GLUCOSE, CAPILLARY: Glucose-Capillary: 98 mg/dL (ref 70–99)

## 2022-01-28 LAB — PHOSPHORUS: Phosphorus: 2.4 mg/dL — ABNORMAL LOW (ref 2.5–4.6)

## 2022-01-28 MED ORDER — K PHOS MONO-SOD PHOS DI & MONO 155-852-130 MG PO TABS
250.0000 mg | ORAL_TABLET | Freq: Once | ORAL | Status: AC
  Administered 2022-01-28: 250 mg via ORAL
  Filled 2022-01-28: qty 1

## 2022-01-28 MED ORDER — NYSTATIN 100000 UNIT/ML MT SUSP
5.0000 mL | Freq: Four times a day (QID) | OROMUCOSAL | Status: DC
  Administered 2022-01-28 (×2): 500000 [IU] via ORAL
  Filled 2022-01-28 (×2): qty 5

## 2022-01-28 MED ORDER — SODIUM PHOSPHATES 45 MMOLE/15ML IV SOLN
15.0000 mmol | Freq: Once | INTRAVENOUS | Status: DC
  Filled 2022-01-28: qty 5

## 2022-01-28 MED ORDER — ORAL CARE MOUTH RINSE: 15.0000 mL | OROMUCOSAL | Status: DC | PRN

## 2022-01-28 MED ORDER — NYSTATIN 100000 UNIT/GM EX POWD
Freq: Three times a day (TID) | CUTANEOUS | Status: DC
  Filled 2022-01-28: qty 15

## 2022-01-28 NOTE — Progress Notes (Signed)
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  PMH significant for HTH, HLD, asthma, GERD, hypothyroidism, obesity obesity BMI 37.00, NSCLC (s/p of radiation therapy, no surgery or chemo), DVT not on AC, lymphedema, GI bleeding. Additional Sx performed on 8/20 for subtotal colectomy. Re-evaluation performed this date.   OT comments  Upon entering the room, pt supine in bed with husband in room. Pt's daughter also arrives to room and RN present with medications. Pt needing mod A for supine >sit and able to maintain sitting balance with close supervision while she combs hair. RN provided pt with medications and encouraged pt to hold her own cup to take medications. RN changing linens around pt while seated. She stands for ~ 1 minute for linen to be arranged under her with max A to stand. Pt needing assistance to facilitate scoot back onto bed further and then max A for sit >supine. RN assisted therapist with repositioning pt in bed. OT encouraged pt to do as much for herself as possible. All needs within reach and family remains within room.    Recommendations for follow up therapy are one component of a multi-disciplinary discharge planning process, led by the attending physician.  Recommendations may be updated based on patient status, additional functional criteria and insurance authorization.    Follow Up Recommendations  Skilled nursing-short term rehab (<3 hours/day)    Assistance Recommended at Discharge Frequent or constant Supervision/Assistance  Patient can return home with the following  Two people to help with walking and/or transfers;A lot of help with  bathing/dressing/bathroom;Assistance with cooking/housework;Assist for transportation;Help with stairs or ramp for entrance;Direct supervision/assist for medications management   Equipment Recommendations  Other (comment) (defer to next venue of care)       Precautions / Restrictions Precautions Precautions: Fall Precaution Comments: new ostomy, JP drains x 2       Mobility Bed Mobility Overal bed mobility: Needs Assistance Bed Mobility: Rolling, Supine to Sit, Sit to Supine Rolling: Max assist   Supine to sit: Mod assist Sit to supine: Max assist        Transfers Overall transfer level: Needs assistance Equipment used: 1 person hand held assist Transfers: Sit to/from Stand Sit to Stand: Max assist                 Balance Overall balance assessment: Needs assistance Sitting-balance support: Feet supported, Bilateral upper extremity supported Sitting balance-Leahy Scale: Good Sitting balance - Comments: Pt seated and combing hair with close sueprvision for safety   Standing balance support: During functional activity Standing balance-Leahy Scale: Zero                             ADL either performed or assessed with clinical judgement    Extremity/Trunk Assessment Upper Extremity Assessment Upper Extremity Assessment: Generalized weakness   Lower Extremity Assessment Lower Extremity Assessment: Generalized weakness        Vision Patient Visual Report: No change from baseline            Cognition Arousal/Alertness: Awake/alert Behavior During Therapy: WFL for tasks assessed/performed Overall Cognitive Status: Within Functional Limits for tasks assessed  General Comments: A & O x4 and needing encouragement                   Pertinent Vitals/ Pain       Pain Assessment Pain Assessment: Faces Faces Pain Scale: Hurts even more Pain Location: generalized Pain Descriptors / Indicators:  Discomfort, Grimacing Pain Intervention(s): Limited activity within patient's tolerance, Monitored during session, Premedicated before session, Repositioned         Frequency  Min 2X/week        Progress Toward Goals  OT Goals(current goals can now be found in the care plan section)  Progress towards OT goals: Progressing toward goals  Acute Rehab OT Goals Patient Stated Goal: to decrease pain and go home OT Goal Formulation: With patient/family Time For Goal Achievement: 02/08/22 Potential to Achieve Goals: [place] Discharge plan remains appropriate;Frequency remains appropriate       AM-PAC OT "6 Clicks" Daily Activity     Outcome Measure   Help from another person eating meals?: A Little Help from another person taking care of personal grooming?: A Little Help from another person toileting, which includes using toliet, bedpan, or urinal?: Total Help from another person bathing (including washing, rinsing, drying)?: A Lot Help from another person to put on and taking off regular upper body clothing?: A Lot Help from another person to put on and taking off regular lower body clothing?: Total 6 Click Score: 12    End of Session    OT Visit Diagnosis: Muscle weakness (generalized) (M62.81)   Activity Tolerance Patient limited by fatigue   Patient Left in bed;with call bell/phone within reach;with bed alarm set;with family/visitor present   Nurse Communication Mobility status        Time: 1027-2536 OT Time Calculation (min): 23 min  Charges: OT General Charges $OT Visit: 1 Visit OT Treatments $Self Care/Home Management : 8-22 mins $Therapeutic Activity: 8-22 mins  Darleen Crocker, MS, OTR/L , CBIS ascom [phone]  01/28/22, 3:55 PM

## 2022-01-28 NOTE — TOC Progression Note (Signed)
Transition of Care [facility]) - Progression Note    Patient Details  Name: LATARSHA ZANI MRN: [id number] Date of Birth: 25-Jun-1940  [facility] CM/SW Contact  Beverly Sessions, RN Phone Number: 01/28/2022, 3:48 PM  Clinical Narrative:     Insurance auth received.  Auth number [id number].  Valid 8/24-8/28.  MD notified and [facility] notified   Expected Discharge Plan: [place] Barriers to Discharge: Continued Medical Work up  Expected Discharge Plan and Services Expected Discharge Plan: [place]   Discharge Planning Services: CM Consult Post Acute Care Choice: [place] (vs home health if improved.) Living arrangements for the past 2 months: Single Family Home                                       Social Determinants of Health (SDOH) Interventions    Readmission Risk Interventions     No data to display

## 2022-01-28 NOTE — Consult Note (Addendum)
PHARMACY CONSULT NOTE - FOLLOW UP  Pharmacy Consult for Electrolyte Monitoring and Replacement   Recent Labs: Potassium (mmol/L)  Date Value  01/27/2022 4.7  10/16/2012 3.6   Magnesium (mg/dL)  Date Value  01/28/2022 2.0   Calcium (mg/dL)  Date Value  01/27/2022 7.8 (L)   Calcium, Total (mg/dL)  Date Value  10/16/2012 8.6   Albumin (g/dL)  Date Value  01/18/2022 3.4 (L)  10/16/2012 3.4   Phosphorus (mg/dL)  Date Value  01/28/2022 2.4 (L)   Sodium (mmol/L)  Date Value  01/27/2022 138  10/16/2012 136   Assessment: POD7 s/p sigmoidectomy with anastomosis and repair of cecal perforation with protective loop ileostomy creation. Pharmacy consulted for electrolytes replacement.  Goal of Therapy:  K =/> 4, Mg =/> 2, all other electrolytes WNL  K 4.7 - no replacement needed Mg 2.0 - no replacement needed Corrected calcium 8.6 - no replacement at this time Phos 2.4 - replace K phoes neutral   Plan:  Noted patient edematous per RN assessment. Will replace phos with Kphos neutral 250mg  tab x 1 dose. (Diet advanced today) Will follow up AM labs and replace electrolytes as needed.  Ronique Simerly Rodriguez-Guzman PharmD, BCPS 01/28/2022 8:51 AM

## 2022-01-28 NOTE — Progress Notes (Signed)
Patient ID: Lindsey Thomas, female   DOB: 07-26-1940, 81 y.o.   MRN: [id number] Patient reevaluated this afternoon.  Today she had a better day.  She was able to get out of bed and was able to do few steps.  She sat down on the commode.  She was able to void spontaneously.  She feels that it was a very productive day.  She is complaining of severe mouth pain with difficulty swallowing.  Vitals:   01/28/22 0744 01/28/22 1619  BP: 124/60 (!) 112/57  Pulse: (!) 40 (!) 109  Resp: 18 18  Temp: 98.7 F (37.1 C) 98.1 F (36.7 C)  SpO2: (!) 84% 94%   General: Alert oriented x3 Mouth: Moderate amount of oral thrush Abdomen: Soft and depressible wound covered with new dressings.  Ileostomy pink and patent.  Assessment and plan: Patient unable to eat a lot of the soft diet due to pain in her mouth and difficulty swallowing.  I discussed situation with hospitalist which will start Nystatin.  We will continue with soft diet.  Continue PT/OT  I will follow-up closely.  Herbert Pun, MD, FACS

## 2022-01-28 NOTE — Progress Notes (Signed)
Patient has 2 JP drains. Left producing brown odorous drainage. Right producing pale yellow output. Right having 483mL of output tonight left having 17mL. Ileostomy having lighter brown liquid output at 11102mL.  Patient has not urinated during my shift. Notified Neomia Glass NP. New order for in and out cath which produced 564mL.  Patient is still weeping and edematous. Midline incision dressing was redone at the bottom for moderate amount of drainage, serosanguinous. Lower right JP drain has moderate drainage at insertion site.   Patient is A&O, breathing regular unlabored. All needs addressed. Will continue to monitor patient

## 2022-01-28 NOTE — Consult Note (Signed)
Lindsey Thomas Nurse ostomy follow up Stoma type/location: RLQ, ileostomy  Stomal assessment/size: 1" x 1 3/8" oval, budded, pink, moist Peristomal assessment: NA Treatment options for stomal/peristomal skin: 2" skin barrier with silver hydrofiber/hydrocolloid to treat MARSI distal from stoma Output liquid brown stool Ostomy pouching: 1pc. Flex convex with 2" skin barrier ring  Education provided:  Husband and daughter at bedside. Plans made to change ostomy pouch and wound dressing tomorrow to prepare for DC Saturday to SNF. All pleased with that plan; prefer to have fresh pouch in place for weekend and transfer Enrolled patient in [facility] Discharge program: Yes  Extra supplies in the room 5 flex convex, barrier rings, silver hydrofiber, hydrocolloid, will prepare supplies tomorrow for transfer Verified daughter has my contact information Daughter is [employer] employee. Husband concerned about ostomy care at [facility], explained that while they will not have an ostomy certified RN that all RN's have education on ostomy care. Encouraged patient to participate in care of her ostomy now and while in SNF to be prepared for return to home.   Plans for am visit Friday 8/25  [place], [place], [place]

## 2022-01-28 NOTE — TOC Progression Note (Addendum)
Transition of Care [facility]) - Progression Note    Patient Details  Name: Lindsey Thomas MRN: [id number] Date of Birth: Nov 11, 1940  Transition of [facility] CM/SW Contact  Beverly Sessions, RN Phone Number: 01/28/2022, 12:49 PM  Clinical Narrative:     Insurance auth started in [employer] portal ID # [id number] Daughter and patient updated at bedside   Expected Discharge Plan: [place] Barriers to Discharge: Continued Medical Work up  Expected Discharge Plan and Services Expected Discharge Plan: [place]   Discharge Planning Services: CM Consult Post Acute Care Choice: [place] (vs home health if improved.) Living arrangements for the past 2 months: Single Family Home                                       Social Determinants of Health (SDOH) Interventions    Readmission Risk Interventions     No data to display

## 2022-01-28 NOTE — Progress Notes (Signed)
Patient ID: Lindsey Thomas, female   DOB: [birth year], 81 y.o.   MRN: [id number]     [place] Hospital Day(s): 10.   Interval History: Patient seen and examined, no acute events or new complaints overnight. Patient reports feeling better this morning.  She was able to sleep better.  She was tolerating for liquid diet.  No nausea or vomiting.  Ileostomy working adequately.  She was able to see the bedside with PT yesterday.  Denies any chest pain or shortness of breath.  Vital signs in last 24 hours: [min-max] current  Temp:  [97.6 F (36.4 C)-98.7 F (37.1 C)] 98.7 F (37.1 C) (08/24 0744) Pulse Rate:  [40-100] 40 (08/24 0744) Resp:  [18-20] 18 (08/24 0744) BP: (110-128)/(51-60) 124/60 (08/24 0744) SpO2:  [84 %-100 %] 84 % (08/24 0744) Weight:  [75 kg] 75 kg (08/24 0412)     Height: 4\' 9"  (144.8 cm) Weight: 75 kg BMI (Calculated): 35.77   Physical Exam:  Constitutional: alert, cooperative and no distress  Respiratory: breathing non-labored at rest  Cardiovascular: regular rate and sinus rhythm  Gastrointestinal: soft, non-tender, and non-distended.  Ileostomy pink and patent.  Midline partially open wound with serous output.  No erythema.  Labs:     Latest Ref Rng & Units 01/25/2022    5:45 AM 01/25/2022   12:50 AM 01/24/2022    8:14 PM  CBC  WBC 4.0 - 10.5 K/uL 15.9  16.7    Hemoglobin 12.0 - 15.0 g/dL 10.3  10.6  10.5   Hematocrit 36.0 - 46.0 % 32.4  34.8  31.0   Platelets 150 - 400 K/uL 199  216        Latest Ref Rng & Units 01/27/2022    9:46 AM 01/26/2022    4:16 AM 01/25/2022    5:45 AM  CMP  Glucose 70 - 99 mg/dL 108  207  172   BUN 8 - 23 mg/dL 12  15  16    Creatinine 0.44 - 1.00 mg/dL 0.43  0.61  0.67   Sodium 135 - 145 mmol/L 138  137  135   Potassium 3.5 - 5.1 mmol/L 4.7  4.1  5.4   Chloride 98 - 111 mmol/L 112  110  109   CO2 22 - 32 mmol/L 21  23  20    Calcium 8.9 - 10.3 mg/dL 7.8  7.6  7.8     Imaging studies: No new pertinent imaging  studies   Assessment/Plan:  81 y.o. 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   Complicated with colorectal anastomosis 4 Day Post-Op s/p subtotal colectomy  -Patient with stable vital signs,. -Heart rate continues to get better.  Range between 88-106.  There is a heart rate chart of 40 bpm which does not seem to be real.  This morning patient alert, oriented x3.  No chest pain.  No shortness of breath.  No lightheaded. - End ileostomy is pink and patent with ileostomy output -Tolerating full liquids liquid diet.  Will advance to solid foot and assess for toleration -Pelvic drain continue with intermittent stool output.  This is able to be clear with milking of the drain.  This should resolve spontaneously. - I personally changed the dressings. Penrose removed.  Wound partially packed with Aquacel. - Continue DVT prophylaxis.  Severely debilitated patient - I encourage the patient to engage with PT/OT -Advancing diet for better nutrition   Hypertension -Patient with adequate blood pressure.   -I agree with Lasix for lymphedema -Continue IV antibiotic therapy   Lymphedema -I agree with Lasix.  This will help with decreasing the right abdomen drain output as well  Arnold Long, MD

## 2022-01-28 NOTE — Progress Notes (Signed)
Patient ID: ARRON MCNAUGHT, female   DOB: 1941-03-04, 81 y.o.   MRN: [id number]  Patient did not void since I&O cath this morning. Bladder scan showed 7 ml. PT was able to help her to [facility]. Patient voided 50 ml.  Haydee Salter, RN

## 2022-01-28 NOTE — Progress Notes (Signed)
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  PMH significant for HTH, HLD, asthma, GERD, hypothyroidism, obesity obesity BMI 37.00, NSCLC (s/p of radiation therapy, no surgery or chemo), DVT not on AC, lymphedema, GI bleeding. Additional Sx performed on 8/20 for subtotal colectomy. Re-evaluation performed this date.    PT Comments    Pt was supine in bed with supportive family at bedside. She agrees to session and requested to attempt OOB to [facility] so she would not have to be in/out cath. She was much more alert and stronger than previous date. Unable to maintain balance EOB previous date and today was able to sit with supervision only. Session included standing 3 x EOB with +2 assist for safety only. Achieve standing with +1 mod assist. Unable to safely progress to taking steps with use of RW. Elected to stand pivot pt to [facility] without use of RW. Pt was able to urinate small amount prior to stand pivot back to EOB. She tolerated there ex EOB and overall is demonstrating much improved abilities. Rehab at DC remains most appropriate to maximize independence  while assisting pt to PLOF. Acute PT will continue to follow per current POC.    Recommendations for follow up therapy are one component of a multi-disciplinary discharge planning process, led by the attending physician.  Recommendations may be updated based on patient status, additional functional criteria and insurance authorization.  Follow Up Recommendations  Skilled nursing-short term rehab (<3 hours/day) Can patient physically be transported by private vehicle: No   Assistance Recommended at Discharge Frequent or constant  Supervision/Assistance  Patient can return home with the following A lot of help with bathing/dressing/bathroom;Assistance with cooking/housework;Assist for transportation;Help with stairs or ramp for entrance;Two people to help with walking and/or transfers;Assistance with feeding;Direct supervision/assist for medications management;Direct supervision/assist for financial management   Equipment Recommendations  Other (comment) (defer to next level of care)       Precautions / Restrictions Precautions Precautions: Fall Precaution Comments: new ostomy, JP drains x 2 Restrictions Weight Bearing Restrictions: No     Mobility  Bed Mobility Overal bed mobility: Needs Assistance Bed Mobility: Rolling, Supine to Sit, Sit to Supine Rolling:  (pt unwilling to roll. Prefers to sit straight up in bed from supine position (HOB elevated ~20 degrees))   Supine to sit: Max assist Sit to supine: Max assist     Transfers Overall transfer level: Needs assistance Equipment used: Rolling walker (2 wheels), None Transfers: Sit to/from Stand Sit to Stand: +2 safety/equipment, Mod assist, From elevated surface (1st trial +2 min assist. 2nd trial +1 mod assist form elevated bed height.) Stand pivot transfers: Max assist, +2 safety/equipment, From elevated surface    General transfer comment: Pt was able to stand to youth RW 3 x EOB prior to stand pivot without RW to [facility]. max assist to stand pivot.    Ambulation/Gait  General Gait Details: Is able to clear BLEs with assistance with lateral wt shift to allow opposite LE advancement.   Balance Overall balance assessment: Needs assistance Sitting-balance support: Feet supported, Bilateral upper extremity supported Sitting balance-Leahy Scale: Good Sitting balance - Comments: much improved sitting balance today. was able to sit EOB x > 20 minutes without LOB   Standing  balance support: During functional activity Standing balance-Leahy Scale:  Poor Standing balance comment: pt did stand x ~ 20-30 secound 3 x with BUE support on RW. poor balance overall but improving quickly     Cognition Arousal/Alertness: Awake/alert Behavior During Therapy: WFL for tasks assessed/performed Overall Cognitive Status: Within Functional Limits for tasks assessed    General Comments: A & O x4 and needing encouragement           General Comments General comments (skin integrity, edema, etc.): Reviewed importance of increasing activity throughout the day to promote return in strength and abilties.      Pertinent Vitals/Pain Pain Assessment Faces Pain Scale: Hurts little more Pain Location: generalized Pain Descriptors / Indicators: Discomfort, Grimacing Pain Intervention(s): Limited activity within patient's tolerance, Monitored during session, Premedicated before session, Repositioned     PT Goals (current goals can now be found in the care plan section) Acute Rehab PT Goals Patient Stated Goal: be able to get on/off BSC so they wont have to in/out cath me Progress towards PT goals: Progressing toward goals    Frequency    Min 2X/week      PT Plan Current plan remains appropriate       AM-PAC PT "6 Clicks" Mobility   Outcome Measure  Help needed turning from your back to your side while in a flat bed without using bedrails?: A Lot Help needed moving from lying on your back to sitting on the side of a flat bed without using bedrails?: A Lot Help needed moving to and from a bed to a chair (including a wheelchair)?: A Lot Help needed standing up from a chair using your arms (e.g., wheelchair or bedside chair)?: A Lot Help needed to walk in hospital room?: Total Help needed climbing 3-5 steps with a railing? : Total 6 Click Score: 10    End of Session   Activity Tolerance: Patient tolerated treatment well Patient left: in bed;with family/visitor present;with SCD's reapplied Nurse Communication: Mobility status PT Visit  Diagnosis: Muscle weakness (generalized) (M62.81);Unsteadiness on feet (R26.81);Difficulty in walking, not elsewhere classified (R26.2);Pain     Time: 1518-1600 PT Time Calculation (min) (ACUTE ONLY): 42 min  Charges:  $Gait Training: 8-22 mins $Therapeutic Activity: 23-37 mins                     Julaine Fusi PTA 01/28/22, 5:06 PM

## 2022-01-28 NOTE — Progress Notes (Signed)
       CROSS COVER NOTE  NAME: Lindsey Thomas MRN: [id number] DOB : 1940/09/30    Date of Service   01/28/2022   HPI/Events of Note   Notified by nursing that [name] has not had urine output since ~1430 yesterday. Bladder scan shows 390 mL.  Interventions   Plan: In and Out cath x1     This document was prepared using Dragon voice recognition software and may include unintentional dictation errors.  Neomia Glass DNP, MHA, FNP-BC Nurse Practitioner [facility] Pager [phone]

## 2022-01-28 NOTE — Progress Notes (Signed)
[place] at [place] NAME: Lindsey Thomas    MR#:  [id number]  DATE OF BIRTH:  1940-12-24  SUBJECTIVE:   Patient's husband bedside. Tolerated  soft diet today. General surgery Dr. Windell Moment changed pressings on the drains. Patient working with physical therapy appears quite deconditioned. Needs lot of help.   VITALS:  Blood pressure 124/60, pulse (!) 40, temperature 98.7 F (37.1 C), temperature source Oral, resp. rate 18, height 4\' 9"  (1.448 m), weight 75 kg, SpO2 (!) 84 %.  PHYSICAL EXAMINATION:   GENERAL:  81 y.o.-year-old patient lying in the bed with no acute distress. Obese LUNGS: Normal breath sounds bilaterally, no wheezing, rales, rhonchi.  CARDIOVASCULAR: S1, S2 normal. No murmurs, rubs, or gallops. Tachycardia ABDOMEN: Soft, right-sided ileostomy and Penrose drain  EXTREMITIES: upper and lower extremity edema dependent    NEUROLOGIC: nonfocal  patient is alert and awake SKIN: per RN  LABORATORY PANEL:  CBC Recent Labs  Lab 01/25/22 0545  WBC 15.9*  HGB 10.3*  HCT 32.4*  PLT 199     Chemistries  Recent Labs  Lab 01/27/22 0946 01/28/22 0618  NA 138  --   K 4.7  --   CL 112*  --   CO2 21*  --   GLUCOSE 108*  --   BUN 12  --   CREATININE 0.43*  --   CALCIUM 7.8*  --   MG 1.8 2.0     Assessment and Plan 81 year old female with history of non-small cell lung cancer, hyperlipidemia, lymphedema, sleep apnea, COPD.  She presented back to the hospital with abdominal pain and distention.  Repeat CT scan showed increased dilatation of the colon until the mid sigmoid colon where there is abrupt change in colonic caliber consistent with sigmoid colonic obstruction   Hypotension --Improved after second surgery.   --Decrease rate of IV fluid hydration. --  Continue IV antibiotics Rocephin and Flagyl.  Likely secondary to abscess in abdomen.   --Follow-up cultures-- few WBC, few PMN's --no fever   Metabolic  acidosis --CO2 now in normal range.  Improved after second surgery.   Bowel obstruction_sigmoid colon --01/20/2022 had a sigmoid colectomy, repair of cecum and transverse colon with ileostomy.   --On 01/24/2022, the patient went back to the operating room for abscess drainage and colectomy. --  NG tube as per general surgery--now removed -- Patient started on clear liquid diet--advance diet as tolerated   Lymphedema --will resume lasix    Hypokalemia --Replaced into the normal range.   --k 4.7   LBBB (left bundle branch block) --Patient has left bundle blockage on EKG. outpatient stress test which was negative in April 2023.   Asthma -Bronchodilators   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.0 to 39.9 BMI 36.26 with height and weight in computer.  Hyponatremia Last sodium normal range at 137 resolved   Lung nodule Small pulmonary nodules measuring 4 mm or less in size is in the lung bases.  Will need follow-up with her oncology team as outpatient especially with history of lung cancer.   Hypomagnesemia Mag 1.8   Sinus tachycardia IV metoprolol as needed Start low dose metoprolol 12.5 mg bid since bp ok   Hypoglycemia Resolved   TOC for d/c planning to rehab.  Procedures s/p sigmoidectomy with anastomosis and repair of cecal perforation with protective loop ileostomy creation Family communication :dter [name] Consults :gen surgery CODE STATUS: FULL DVT Prophylaxis :enoxaparin Level of care: Med-Surg Status is:  Inpatient Remains inpatient appropriate because: complex surgical issue--improving/TOC working on Rehab bed    TOTAL TIME TAKING CARE OF THIS PATIENT: 35 minutes.  >50% time spent on counselling and coordination of care  Note: This dictation was prepared with Dragon dictation along with smaller phrase technology. Any transcriptional errors that result from this process are unintentional.  [name] M.D    [facility]   CC: Primary care  physician; Idelle Crouch, MD

## 2022-01-29 DIAGNOSIS — E669 Obesity, unspecified: Secondary | ICD-10-CM | POA: Diagnosis not present

## 2022-01-29 DIAGNOSIS — T8143XA Infection following a procedure, organ and space surgical site, initial encounter: Secondary | ICD-10-CM | POA: Diagnosis not present

## 2022-01-29 DIAGNOSIS — K56699 Other intestinal obstruction unspecified as to partial versus complete obstruction: Secondary | ICD-10-CM | POA: Diagnosis not present

## 2022-01-29 DIAGNOSIS — I890 Lymphedema, not elsewhere classified: Secondary | ICD-10-CM | POA: Diagnosis not present

## 2022-01-29 LAB — PHOSPHORUS: Phosphorus: 3.1 mg/dL (ref 2.5–4.6)

## 2022-01-29 LAB — BASIC METABOLIC PANEL
Anion gap: 9 (ref 5–15)
BUN: 12 mg/dL (ref 8–23)
CO2: 22 mmol/L (ref 22–32)
Calcium: 7.8 mg/dL — ABNORMAL LOW (ref 8.9–10.3)
Chloride: 105 mmol/L (ref 98–111)
Creatinine, Ser: 0.42 mg/dL — ABNORMAL LOW (ref 0.44–1.00)
GFR, Estimated: >60 mL/min (ref >60)
Glucose, Bld: 121 mg/dL — ABNORMAL HIGH (ref 70–99)
Potassium: 3.2 mmol/L — ABNORMAL LOW (ref 3.5–5.1)
Sodium: 136 mmol/L (ref 135–145)

## 2022-01-29 LAB — MAGNESIUM: Magnesium: 1.9 mg/dL (ref 1.7–2.4)

## 2022-01-29 LAB — AEROBIC/ANAEROBIC CULTURE W GRAM STAIN (SURGICAL/DEEP WOUND)

## 2022-01-29 LAB — POTASSIUM: Potassium: 3.7 mmol/L (ref 3.5–5.1)

## 2022-01-29 LAB — GLUCOSE, CAPILLARY: Glucose-Capillary: 118 mg/dL — ABNORMAL HIGH (ref 70–99)

## 2022-01-29 MED ORDER — LOPERAMIDE HCL 2 MG PO CAPS
2.0000 mg | ORAL_CAPSULE | Freq: Two times a day (BID) | ORAL | Status: DC
  Administered 2022-01-29: 2 mg via ORAL
  Filled 2022-01-29: qty 1

## 2022-01-29 MED ORDER — MAGIC MOUTHWASH
5.0000 mL | Freq: Four times a day (QID) | ORAL | Status: DC
  Administered 2022-01-29 – 2022-01-30 (×6): 5 mL via ORAL
  Filled 2022-01-29 (×8): qty 10

## 2022-01-29 MED ORDER — POTASSIUM CHLORIDE 20 MEQ PO PACK
40.0000 meq | PACK | Freq: Once | ORAL | Status: AC
  Administered 2022-01-29: 40 meq via ORAL
  Filled 2022-01-29: qty 2

## 2022-01-29 MED ORDER — LIDOCAINE VISCOUS HCL 2 % MT SOLN
15.0000 mL | Freq: Three times a day (TID) | OROMUCOSAL | Status: DC | PRN
  Administered 2022-01-29 (×2): 15 mL via OROMUCOSAL
  Filled 2022-01-29 (×3): qty 15

## 2022-01-29 MED ORDER — MAGNESIUM SULFATE 2 GM/50ML IV SOLN
2.0000 g | Freq: Once | INTRAVENOUS | Status: AC
  Administered 2022-01-29: 2 g via INTRAVENOUS
  Filled 2022-01-29: qty 50

## 2022-01-29 MED ORDER — LOPERAMIDE HCL 2 MG PO CAPS
2.0000 mg | ORAL_CAPSULE | Freq: Three times a day (TID) | ORAL | Status: DC
  Administered 2022-01-29 – 2022-01-30 (×2): 2 mg via ORAL
  Filled 2022-01-29 (×2): qty 1

## 2022-01-29 MED ORDER — METOPROLOL TARTRATE 25 MG PO TABS
25.0000 mg | ORAL_TABLET | Freq: Two times a day (BID) | ORAL | Status: DC
  Administered 2022-01-29 – 2022-01-30 (×2): 25 mg via ORAL
  Filled 2022-01-29 (×3): qty 1

## 2022-01-29 MED ORDER — METRONIDAZOLE 500 MG PO TABS
500.0000 mg | ORAL_TABLET | Freq: Two times a day (BID) | ORAL | Status: DC
  Administered 2022-01-29 – 2022-01-30 (×2): 500 mg via ORAL
  Filled 2022-01-29 (×3): qty 1

## 2022-01-29 MED ORDER — CIPROFLOXACIN HCL 500 MG PO TABS
500.0000 mg | ORAL_TABLET | Freq: Two times a day (BID) | ORAL | Status: DC
  Administered 2022-01-29 – 2022-01-30 (×3): 500 mg via ORAL
  Filled 2022-01-29 (×3): qty 1

## 2022-01-29 MED ORDER — CEFDINIR 300 MG PO CAPS
300.0000 mg | ORAL_CAPSULE | Freq: Two times a day (BID) | ORAL | Status: DC
  Administered 2022-01-29: 300 mg via ORAL
  Filled 2022-01-29: qty 1

## 2022-01-29 NOTE — TOC Progression Note (Addendum)
Transition of Care [facility]) - Progression Note    Patient Details  Name: ZEFFIE BICKERT MRN: [id number] Date of Birth: 03-Oct-1940  Transition of Care [facility]) CM/SW [place], LCSW Phone Number: 01/29/2022, 12:42 PM  Clinical Narrative:    [facility] confirmed patient can come tomorrow.    Expected Discharge Plan: [place] Barriers to Discharge: Continued Medical Work up  Expected Discharge Plan and Services Expected Discharge Plan: [place]   Discharge Planning Services: CM Consult Post Acute Care Choice: [place] (vs home health if improved.) Living arrangements for the past 2 months: Single Family Home                                       Social Determinants of Health (SDOH) Interventions    Readmission Risk Interventions     No data to display

## 2022-01-29 NOTE — Progress Notes (Addendum)
[facility] [place] NAME: Lindsey Thomas     MR#:  [id number]  DATE OF BIRTH:  12/02/1940  SUBJECTIVE:   Patient's husband and daughter bedside.  Complains of oral thrush and difficulty swallowing. Started on magic mouthwash and nystatin switch and swallow. Patient did get out of bed to bedside commode. Appears significantly weak. Trying to eat l few bites  VITALS:  Blood pressure 113/73, pulse (!) 105, temperature 98.6 F (37 C), temperature source Oral, resp. rate 18, height 4\' 9"  (1.448 m), weight 84 kg, SpO2 100 %.  PHYSICAL EXAMINATION:   GENERAL:  81 y.o.-year-old patient lying in the bed with no acute distress. Obese LUNGS: Normal breath sounds bilaterally, no wheezing, rales, rhonchi.  CARDIOVASCULAR: S1, S2 normal. No murmurs, rubs, or gallops. Tachycardia ABDOMEN: Soft, right-sided ileostomy and Penrose drain  EXTREMITIES: upper and lower extremity edema dependent    NEUROLOGIC: nonfocal  patient is alert and awake, very deconditioned SKIN: per RN  LABORATORY PANEL:  CBC Recent Labs  Lab 01/25/22 0545  WBC 15.9*  HGB 10.3*  HCT 32.4*  PLT 199     Chemistries  Recent Labs  Lab 01/29/22 0905  NA 136  K 3.2*  CL 105  CO2 22  GLUCOSE 121*  BUN 12  CREATININE 0.42*  CALCIUM 7.8*  MG 1.9     Assessment and Plan 81 year old female with history of non-small cell lung cancer, hyperlipidemia, lymphedema, sleep apnea, COPD.  She presented back to the hospital with abdominal pain and distention.  Repeat CT scan showed increased dilatation of the colon until the mid sigmoid colon where there is abrupt change in colonic caliber consistent with sigmoid colonic obstruction   Hypotension --Improved after second surgery.   --Decrease rate of IV fluid hydration. -- IV antibiotics Rocephin and Flagyl-- change to Cipro and Flagyl for two more days --Follow-up interop cultures-- few WBC, few PMN's Pseudomonas --no fever    Metabolic acidosis --CO2 now in normal range.  Improved after second surgery. --resolved  Oral thrush --started on Nystatin S & S and magic mouth wash   Bowel obstruction_sigmoid colon --01/20/2022 had a sigmoid colectomy, repair of cecum and transverse colon with ileostomy.   --On 01/24/2022, the patient went back to the operating room for abscess drainage and colectomy. --  NG tube as per general surgery--now removed -- Patient started on clear liquid diet--advance diet as tolerated -- patient has significant amount of ileostomy output. Electrolytes repeated. Patient started on lomotil BID scheduled by surgery.   Lymphedema --will resume lasix    Hypokalemia --Replaced into the normal range.   --k 3.2 today. Received 62 M EQ of PO potassium   LBBB (left bundle branch block) --Patient has left bundle blockage on EKG. outpatient stress test which was negative in April 2023.   Asthma -Bronchodilators   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.0 to 39.9 BMI 36.26 with height and weight in computer.   Lung nodule Small pulmonary nodules measuring 4 mm or less in size is in the lung bases.  Will need follow-up with her oncology team as outpatient especially with history of lung cancer.   Hypomagnesemia Mag 2.0 -- resolved   Sinus tachycardia IV metoprolol as needed Start low dose metoprolol 25 mg bid since bp ok   Hypoglycemia Resolved   TOC for d/c planning to rehab. Anticipate discharge to [facility] tomorrow  Procedures s/p sigmoidectomy with anastomosis and repair of cecal perforation with protective  loop ileostomy creation Family communication :dter [name], husband Consults :gen surgery CODE STATUS: FULL DVT Prophylaxis :enoxaparin Level of care: Med-Surg Status is: Inpatient Remains inpatient appropriate because: complex surgical issue-likely discharge tomorrow to [facility]    TOTAL TIME TAKING CARE OF THIS PATIENT: 35 minutes.  >50% time spent on counselling and  coordination of care  Note: This dictation was prepared with Dragon dictation along with smaller phrase technology. Any transcriptional errors that result from this process are unintentional.  [name] M.D    [facility]   CC: Primary care physician; Idelle Crouch, MD

## 2022-01-29 NOTE — Progress Notes (Signed)
Pharmacy - Brief Note  OR culture from 8/20 with P aeruginosa and bacteroides.  Patient on ceftriaxone and metronidazole for previous 7 days and changed to po cefidinir and metronidazole this morning.    History of nausea due to levofloxacin  Plan: Use ciprofloxacin in place of cefdinir pending susceptibilities.  If develops nausea, can try IV (assuming returns susceptible)  Continue metronidazole Plan is 7days s/p 8/20 OR so stop date 8/27 entered for both antibiotics   Doreene Eland, PharmD, BCPS, BCIDP Work Cell: [phone] 01/29/2022 10:16 AM

## 2022-01-29 NOTE — Progress Notes (Signed)
Patient ID: Lindsey Thomas, female   DOB: 24-Nov-1940, 81 y.o.   MRN: [id number]     [place] Hospital Day(s): 11.   Interval History: Patient seen and examined, no acute events or new complaints overnight. Patient reports feeling better this morning.  She understood that she had a better night.  She also reports that her mouth feels better.  Denies any nausea or vomiting.  Vital signs in last 24 hours: [min-max] current  Temp:  [98.1 F (36.7 C)-98.7 F (37.1 C)] 98.3 F (36.8 C) (08/25 0409) Pulse Rate:  [40-109] 105 (08/25 0409) Resp:  [16-20] 20 (08/25 0409) BP: (112-125)/(51-60) 118/51 (08/25 0409) SpO2:  [84 %-97 %] 96 % (08/25 0409) Weight:  [84 kg] 84 kg (08/25 0409)     Height: 4\' 9"  (144.8 cm) Weight: 84 kg BMI (Calculated): 40.06   Physical Exam:  Constitutional: alert, cooperative and no distress  Respiratory: breathing non-labored at rest  Cardiovascular: regular rate and sinus rhythm  Gastrointestinal: soft, non-tender, and non-distended.  Ileostomy is pink and patent.  Partially open wound for drainage.  Labs:     Latest Ref Rng & Units 01/25/2022    5:45 AM 01/25/2022   12:50 AM 01/24/2022    8:14 PM  CBC  WBC 4.0 - 10.5 K/uL 15.9  16.7    Hemoglobin 12.0 - 15.0 g/dL 10.3  10.6  10.5   Hematocrit 36.0 - 46.0 % 32.4  34.8  31.0   Platelets 150 - 400 K/uL 199  216        Latest Ref Rng & Units 01/27/2022    9:46 AM 01/26/2022    4:16 AM 01/25/2022    5:45 AM  CMP  Glucose 70 - 99 mg/dL 108  207  172   BUN 8 - 23 mg/dL 12  15  16    Creatinine 0.44 - 1.00 mg/dL 0.43  0.61  0.67   Sodium 135 - 145 mmol/L 138  137  135   Potassium 3.5 - 5.1 mmol/L 4.7  4.1  5.4   Chloride 98 - 111 mmol/L 112  110  109   CO2 22 - 32 mmol/L 21  23  20    Calcium 8.9 - 10.3 mg/dL 7.8  7.6  7.8     Imaging studies: No new pertinent imaging studies   Assessment/Plan:  81 y.o. 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   Complicated with colorectal anastomosis 5 Day Post-Op s/p subtotal colectomy   -Patient with stable vital signs,. -Heart rate continues to get better, up to 105.   - End ileostomy is pink and patent with high ileostomy output.  We will start Imodium to decrease ileostomy output and avoid dehydration. -Continue soft diet. -Pelvic drain continue with intermittent stool output.  This is able to be clear with milking of the drain.  This should resolve spontaneously. - I personally changed the dressings. Penrose removed.  Wound partially packed with Aquacel. - Continue DVT prophylaxis.    Severely debilitated patient - I encourage the patient to engage with PT/OT -Advancing diet for better nutrition   Hypertension -Patient with adequate blood pressure.   -I agree with Lasix for lymphedema -Continue IV antibiotic therapy   Lymphedema -I agree with Lasix.  This will help with decreasing the right abdomen drain  output as well  Oral thrush -Tube was causing significant mouth pain unable to eat solid diet.  Patient started on nystatin.  Today feeling better.  Hopefully she will be able to eat better today.  Arnold Long, MD

## 2022-01-29 NOTE — Consult Note (Addendum)
PHARMACY CONSULT NOTE - FOLLOW UP  Pharmacy Consult for Electrolyte Monitoring and Replacement   Recent Labs: Potassium (mmol/L)  Date Value  01/27/2022 4.7  10/16/2012 3.6   Magnesium (mg/dL)  Date Value  01/28/2022 2.0   Calcium (mg/dL)  Date Value  01/27/2022 7.8 (L)   Calcium, Total (mg/dL)  Date Value  10/16/2012 8.6   Albumin (g/dL)  Date Value  01/18/2022 3.4 (L)  10/16/2012 3.4   Phosphorus (mg/dL)  Date Value  01/28/2022 2.4 (L)   Sodium (mmol/L)  Date Value  01/27/2022 138  10/16/2012 136   Assessment: POD7 s/p sigmoidectomy with anastomosis and repair of cecal perforation with protective loop ileostomy creation. Pharmacy consulted for electrolytes replacement.  Goal of Therapy:  K =/> 4, Mg =/> 2, all other electrolytes WNL  K 4.7>3.2 - delta of 1.5 across 2 days. (on lasix 20mg  PO QD) Mg 2.0>1.9 - slightly below goal. Corrected calcium 8.6 - no replacement at this time Phos 2.4 > 3.1 - no further replacement at this time  Plan:  Scr stable 0.42 w/ UOP 0.3-0.6 ml/k/h. Unable to eat solid diet d/t oral thrush so continues soft diet + imodium to slow the high ileostomy output.  K: K+ 3.2>3.7 After 48meq PO x1. Will repeat with 45meq PO x1; & reassess in AM labs. Noted patient edematous per RN assessment & on lasix 20mg  PO QD continues with stable creatinine.  2. Mg: now WNL after MgSO4 2g IV x1. 3. Will follow up AM labs and replace electrolytes as needed.  Lorna Dibble, PharmD, [facility] Clinical Pharmacist 01/29/2022 8:00 AM

## 2022-01-29 NOTE — Consult Note (Signed)
[place] Nurse wound follow up Wound type: surgical  Wound bed: open areas both distally and proximal, left open intentionally for drains which have since been removed. Staples intact with some opening between staples  Drainage (amount, consistency, odor) serosanguinous  Periwound: edematous; weeping distally  Dressing procedure/placement/frequency: Remove old midline dressing Cleanse periwound skin with saline, pat dry Pack distal and proximal openings with silver hydrofiber (aquacel Ag+) or equivalent Top with silicone foam or dry dressings   [facility] Nurse ostomy follow up Stoma type/location: RLQ, ileostomy  Stomal assessment/size: 1"x 1 3/8" oval shaped, flush with the skin, pink, moist Peristomal assessment: new onset of redness circumferentially; slight mucocutaneous separation from 11-1oclock  Treatment options for stomal/peristomal skin: 2" skin barrier ring; may need ostomy powder with next pouch change  MARSI (medical adhesive related skin injury) distal from the stoma that measures 2cm x 3.5cm x 0.1cm; 100% pink Output liquid green with some formed particles  Ostomy pouching: 1pc.compressible flex pouch, 2" skin barrier ring; silver hydrofiber to the ulceration and cover with hydrocolloid (piece cut to fit to cover silver and keep in place Education provided:  Demonstrated pouch change again with patient, husband and daughter.  Explained rationale for use of silver over the wound and the peristomal wound with family again.  Demonstrated use of wick again and stressed importance of cleaning spout Demonstrated emptying and need to empty when 1/3-1/2 full Discussed diet and medications, daughter is pharmacist however discussed rationale for switching XR meds if needed to liquid form. She will follow up on this Discussed risk for dehydration and s/s to report; drinking one 8oz fluid with each pouch empty Discussed changing pouch when the body is dry, first thing in the morning before eating  and drinking  Discussed risk of food blockage and how to avoid. Discussed activity level and need to ambulate to get stronger Enrolled patient in [facility] Discharge program: Yes  6 compressible flex pouches (1pc)  [employer] item # [id number] 6 Ostomy barrier rings [employer] # [id number] Silver hydrofiber 2 sheets Hydrocollid (1 piece)    Thanks  [place] MSN, RN,CWOCN, CNS, CWON-AP [phone])  [email or web address]

## 2022-01-29 NOTE — Progress Notes (Signed)
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  PMH significant for HTH, HLD, asthma, GERD, hypothyroidism, obesity obesity BMI 37.00, NSCLC (s/p of radiation therapy, no surgery or chemo), DVT not on AC, lymphedema, GI bleeding. Additional Sx performed on 8/20 for subtotal colectomy. Re-evaluation performed this date.   OT comments  Pt seen for OT tx this afternoon. Pt pleasant and eager to trial [facility] transfers. Pt required MAX A +1 for bed mobility with VC for hand placement and sequencing to improve her participation. Once EOB, pt attempted standing requiring MAX A +1 with MAX VC for hand placement and anterior weight shift to prevent posterior LOB but ultimately unable to maintain standing for more than ~10sec. Nurse tech came to assist and pt able to stand with MOD-MAX A +2 and complete step pivot from EOB to BSC and back to EOB with MAX VC for sequencing. TOTAL A for pericare in standing. Pt endorsed increased fatigue this date compared to previous. Pt demonstrating overall progress towards goals and continues to benefit from skilled OT services to maximize return to PLOF. Continue to recommend SNF.    Recommendations for follow up therapy are one component of a multi-disciplinary discharge planning process, led by the attending physician.  Recommendations may be updated based on patient status, additional functional criteria and insurance authorization.    Follow Up Recommendations  Skilled nursing-short term rehab (<3 hours/day)    Assistance Recommended at Discharge Frequent or constant Supervision/Assistance  Patient can return home with the following  Two people to help with walking  and/or transfers;A lot of help with bathing/dressing/bathroom;Assistance with cooking/housework;Assist for transportation;Help with stairs or ramp for entrance;Direct supervision/assist for medications management   Equipment Recommendations  Other (comment) (defer to next venue)    Recommendations for Other Services      Precautions / Restrictions Precautions Precautions: Fall Precaution Comments: new ostomy, JP drains x 2, BUE weeping Restrictions Weight Bearing Restrictions: No       Mobility Bed Mobility Overal bed mobility: Needs Assistance Bed Mobility: Supine to Sit, Sit to Supine     Supine to sit: Max assist Sit to supine: Max assist        Transfers Overall transfer level: Needs assistance Equipment used: Rolling walker (2 wheels) Transfers: Sit to/from Stand, Bed to chair/wheelchair/BSC Sit to Stand: Max assist, +2 physical assistance, Mod assist Stand pivot transfers: Mod assist, Max assist, +2 physical assistance         General transfer comment: VC for sequencing     Balance Overall balance assessment: Needs assistance Sitting-balance support: Feet supported, Bilateral upper extremity supported Sitting balance-Leahy Scale: Fair     Standing balance support: During functional activity, Bilateral upper extremity supported, Reliant on assistive device for balance Standing balance-Leahy Scale: Poor                             ADL either performed or assessed with clinical judgement   ADL Overall ADL's : Needs assistance/impaired                         Toilet Transfer: Moderate assistance;Maximal assistance;Stand-pivot;Rolling walker (2 wheels);BSC/3in1;+2 for physical assistance   Toileting-  Clothing Manipulation and Hygiene: Maximal assistance;Sit to/from stand              Extremity/Trunk Assessment              Vision       Perception     Praxis      Cognition Arousal/Alertness: Awake/alert Behavior  During Therapy: WFL for tasks assessed/performed Overall Cognitive Status: Within Functional Limits for tasks assessed                                 General Comments: encouragement        Exercises      Shoulder Instructions       General Comments      Pertinent Vitals/ Pain       Pain Assessment Pain Assessment: No/denies pain  Home Living                                          Prior Functioning/Environment              Frequency  Min 2X/week        Progress Toward Goals  OT Goals(current goals can now be found in the care plan section)  Progress towards OT goals: Progressing toward goals  Acute Rehab OT Goals Patient Stated Goal: to decrease pain and go home OT Goal Formulation: With patient/family Time For Goal Achievement: 02/08/22 Potential to Achieve Goals: [place] Discharge plan remains appropriate;Frequency remains appropriate    Co-evaluation                 AM-PAC OT "6 Clicks" Daily Activity     Outcome Measure   Help from another person eating meals?: A Little Help from another person taking care of personal grooming?: A Little Help from another person toileting, which includes using toliet, bedpan, or urinal?: Total Help from another person bathing (including washing, rinsing, drying)?: A Lot Help from another person to put on and taking off regular upper body clothing?: A Lot Help from another person to put on and taking off regular lower body clothing?: Total 6 Click Score: 12    End of Session Equipment Utilized During Treatment: Rolling walker (2 wheels);Gait belt  OT Visit Diagnosis: Muscle weakness (generalized) (M62.81)   Activity Tolerance Patient limited by fatigue   Patient Left in bed;with call bell/phone within reach;with bed alarm set;with family/visitor present   Nurse Communication          Time: 1660-6301 OT Time Calculation (min): 48 min  Charges: OT General  Charges $OT Visit: 1 Visit OT Treatments $Self Care/Home Management : 38-52 mins  Ardeth Perfect., MPH, MS, OTR/L ascom [phone] 01/29/22, 4:53 PM

## 2022-01-29 NOTE — Care Management Important Message (Signed)
Important Message  Patient Details  Name: CHERYL CHAY MRN: [id number] Date of Birth: 1940-07-18   Medicare Important Message Given:  Yes     Dannette Aldene 01/29/2022, 11:29 AM

## 2022-01-30 DIAGNOSIS — K631 Perforation of intestine (nontraumatic): Secondary | ICD-10-CM | POA: Diagnosis not present

## 2022-01-30 DIAGNOSIS — I890 Lymphedema, not elsewhere classified: Secondary | ICD-10-CM | POA: Diagnosis not present

## 2022-01-30 DIAGNOSIS — E876 Hypokalemia: Secondary | ICD-10-CM | POA: Diagnosis not present

## 2022-01-30 DIAGNOSIS — K56699 Other intestinal obstruction unspecified as to partial versus complete obstruction: Secondary | ICD-10-CM | POA: Diagnosis not present

## 2022-01-30 DIAGNOSIS — M6281 Muscle weakness (generalized): Secondary | ICD-10-CM | POA: Diagnosis not present

## 2022-01-30 DIAGNOSIS — J4520 Mild intermittent asthma, uncomplicated: Secondary | ICD-10-CM | POA: Diagnosis not present

## 2022-01-30 DIAGNOSIS — R29898 Other symptoms and signs involving the musculoskeletal system: Secondary | ICD-10-CM | POA: Diagnosis not present

## 2022-01-30 DIAGNOSIS — Z79899 Other long term (current) drug therapy: Secondary | ICD-10-CM | POA: Diagnosis not present

## 2022-01-30 DIAGNOSIS — J45909 Unspecified asthma, uncomplicated: Secondary | ICD-10-CM | POA: Diagnosis not present

## 2022-01-30 DIAGNOSIS — K5669 Other partial intestinal obstruction: Secondary | ICD-10-CM | POA: Diagnosis not present

## 2022-01-30 DIAGNOSIS — K56609 Unspecified intestinal obstruction, unspecified as to partial versus complete obstruction: Secondary | ICD-10-CM | POA: Diagnosis not present

## 2022-01-30 DIAGNOSIS — E669 Obesity, unspecified: Secondary | ICD-10-CM | POA: Diagnosis not present

## 2022-01-30 DIAGNOSIS — T8143XA Infection following a procedure, organ and space surgical site, initial encounter: Secondary | ICD-10-CM | POA: Diagnosis not present

## 2022-01-30 DIAGNOSIS — I10 Essential (primary) hypertension: Secondary | ICD-10-CM | POA: Diagnosis not present

## 2022-01-30 DIAGNOSIS — Z932 Ileostomy status: Secondary | ICD-10-CM | POA: Diagnosis not present

## 2022-01-30 DIAGNOSIS — D72829 Elevated white blood cell count, unspecified: Secondary | ICD-10-CM | POA: Diagnosis not present

## 2022-01-30 DIAGNOSIS — E039 Hypothyroidism, unspecified: Secondary | ICD-10-CM | POA: Diagnosis not present

## 2022-01-30 DIAGNOSIS — Z7401 Bed confinement status: Secondary | ICD-10-CM | POA: Diagnosis not present

## 2022-01-30 DIAGNOSIS — D649 Anemia, unspecified: Secondary | ICD-10-CM | POA: Diagnosis not present

## 2022-01-30 DIAGNOSIS — R5381 Other malaise: Secondary | ICD-10-CM | POA: Diagnosis not present

## 2022-01-30 DIAGNOSIS — K56691 Other complete intestinal obstruction: Secondary | ICD-10-CM | POA: Diagnosis not present

## 2022-01-30 DIAGNOSIS — K5732 Diverticulitis of large intestine without perforation or abscess without bleeding: Secondary | ICD-10-CM | POA: Diagnosis not present

## 2022-01-30 LAB — BASIC METABOLIC PANEL
Anion gap: 3 — ABNORMAL LOW (ref 5–15)
BUN: 14 mg/dL (ref 8–23)
CO2: 26 mmol/L (ref 22–32)
Calcium: 7.6 mg/dL — ABNORMAL LOW (ref 8.9–10.3)
Chloride: 106 mmol/L (ref 98–111)
Creatinine, Ser: 0.38 mg/dL — ABNORMAL LOW (ref 0.44–1.00)
GFR, Estimated: >60 mL/min (ref >60)
Glucose, Bld: 111 mg/dL — ABNORMAL HIGH (ref 70–99)
Potassium: 3.8 mmol/L (ref 3.5–5.1)
Sodium: 135 mmol/L (ref 135–145)

## 2022-01-30 LAB — MAGNESIUM: Magnesium: 2.2 mg/dL (ref 1.7–2.4)

## 2022-01-30 LAB — PHOSPHORUS: Phosphorus: 3.3 mg/dL (ref 2.5–4.6)

## 2022-01-30 LAB — GLUCOSE, CAPILLARY
Glucose-Capillary: 87 mg/dL (ref 70–99)
Glucose-Capillary: 96 mg/dL (ref 70–99)

## 2022-01-30 MED ORDER — LOPERAMIDE HCL 2 MG PO CAPS: 4.0000 mg | ORAL_CAPSULE | Freq: Two times a day (BID) | ORAL | Status: DC

## 2022-01-30 MED ORDER — POTASSIUM CHLORIDE 20 MEQ PO PACK
40.0000 meq | PACK | Freq: Once | ORAL | Status: AC
  Administered 2022-01-30: 40 meq via ORAL
  Filled 2022-01-30: qty 2

## 2022-01-30 MED ORDER — LIDOCAINE VISCOUS HCL 2 % MT SOLN: 15.0000 mL | Freq: Three times a day (TID) | OROMUCOSAL | 0 refills | Status: DC | PRN

## 2022-01-30 MED ORDER — METOPROLOL TARTRATE 25 MG PO TABS: 25.0000 mg | ORAL_TABLET | Freq: Two times a day (BID) | ORAL | 0 refills | Status: DC

## 2022-01-30 MED ORDER — NYSTATIN 100000 UNIT/GM EX POWD: Freq: Three times a day (TID) | CUTANEOUS | 0 refills | Status: DC

## 2022-01-30 MED ORDER — TRAMADOL HCL 50 MG PO TABS: 50.0000 mg | ORAL_TABLET | Freq: Four times a day (QID) | ORAL | 0 refills | Status: DC | PRN

## 2022-01-30 MED ORDER — ENSURE ENLIVE PO LIQD: 237.0000 mL | Freq: Three times a day (TID) | ORAL | 12 refills | Status: DC

## 2022-01-30 MED ORDER — MAGIC MOUTHWASH: 5.0000 mL | Freq: Four times a day (QID) | ORAL | 0 refills | Status: DC

## 2022-01-30 MED ORDER — LOPERAMIDE HCL 2 MG PO CAPS: 4.0000 mg | ORAL_CAPSULE | Freq: Two times a day (BID) | ORAL | 0 refills | Status: DC

## 2022-01-30 MED ORDER — METRONIDAZOLE 500 MG PO TABS: 500.0000 mg | ORAL_TABLET | Freq: Two times a day (BID) | ORAL | 0 refills | Status: AC

## 2022-01-30 MED ORDER — CIPROFLOXACIN HCL 500 MG PO TABS: 500.0000 mg | ORAL_TABLET | Freq: Two times a day (BID) | ORAL | 0 refills | Status: AC

## 2022-01-30 NOTE — Consult Note (Addendum)
[facility] for Electrolyte Monitoring and Replacement   Recent Labs: Potassium (mmol/L)  Date Value  01/30/2022 3.8  10/16/2012 3.6   Magnesium (mg/dL)  Date Value  01/30/2022 2.2   Calcium (mg/dL)  Date Value  01/30/2022 7.6 (L)   Calcium, Total (mg/dL)  Date Value  10/16/2012 8.6   Albumin (g/dL)  Date Value  01/18/2022 3.4 (L)  10/16/2012 3.4   Phosphorus (mg/dL)  Date Value  01/30/2022 3.3   Sodium (mmol/L)  Date Value  01/30/2022 135  10/16/2012 136   Assessment: POD10 s/p sigmoidectomy with anastomosis and repair of cecal perforation with protective loop ileostomy creation. Post-operative course complicated by colorectal anastomosis issues and now POD # 6 from subtotal colectomy. Pharmacy consulted for electrolytes replacement.  Scr stable 0.38. Unable to eat solid diet d/t oral thrush so continues soft diet + imodium to slow the high ileostomy output.  Diuresis: Lasix 20 mg PO daily  Goal of Therapy:  K =/> 4, Mg =/> 2, all other electrolytes WNL  Plan:  --K 3.8, Kcl 40 mEq PO x 1 dose --Will follow up AM labs and replace electrolytes as needed  Lindsey Thomas  01/30/2022 8:42 AM

## 2022-01-30 NOTE — Discharge Summary (Signed)
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  Hospital Course: 81 year old female with history of non-small cell lung cancer, hyperlipidemia, lymphedema, sleep apnea, COPD.  She presented back to the hospital with abdominal pain and distention.  Repeat CT scan showed increased dilatation of the colon until the mid sigmoid colon where there is abrupt change in colonic caliber consistent with sigmoid colonic obstruction.  Repeat flexible sigmoidoscopy showed a stricture in the descending colon.  Patient was brought to the operating room by Dr. Windell Moment and noted a serosal tear in the transverse colon and a perforation of the cecum was identified.  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  A sigmoidectomy was done, cecal perforation and serosal tear of the transverse colon was repaired.  Loop ileostomy creation.  The patient had a low-grade temperature on 01/21/2022 and empiric Rocephin and Flagyl was started.  The patient had some hypotension early morning on 01/24/2022 and was given a fluid bolus.  CO2 on the chemistry was lower at 16 and white count went up to 16.1.  Repeat CT scan of the abdomen pelvis done on 01/24/2022 that showed a fluid collection and more free air.  Patient was brought back to the operating room for drainage of abscess and colectomy.  Assessment and Plan: 81 year old female with history of non-small cell lung cancer, hyperlipidemia, lymphedema, sleep apnea, COPD.  She presented back to the hospital  with abdominal pain and distention.  Repeat CT scan showed increased dilatation of the colon until the mid sigmoid colon where there is abrupt change in colonic caliber consistent with sigmoid colonic obstruction   Bowel obstruction_sigmoid colon --01/20/2022 had a sigmoid colectomy, repair of cecum and transverse colon with ileostomy.   --On 01/24/2022, the patient went back to the operating room for abscess drainage and colectomy. --  NG tube as per general surgery--now removed -- Patient started on clear liquid diet--advance diet as tolerated -- patient has significant amount of ileostomy output. Electrolytes repeated. Patient started on lomotil BID scheduled by surgery.   Hypotension --Improved after second surgery.   -- IV antibiotics Rocephin and Flagyl-- change to Cipro and Flagyl for 3 more doses --Follow-up intraop cultures-- few WBC, few PMN's Pseudomonas --no fever   Metabolic acidosis --CO2 now in normal range.  Improved after second surgery. --resolved   Oral thrush --started on Nystatin S & S and magic mouth wash   Lymphedema --will resume lasix    Hypokalemia --Replaced into the normal range.     LBBB (left bundle branch block) --Patient has left bundle blockage on EKG. outpatient stress test which was negative in April 2023.   Asthma -Bronchodilators   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.0 to 39.9 BMI 36.26 with height and weight in computer.   Lung nodule Small pulmonary nodules measuring 4 mm or less in size is in the lung bases.  Will need follow-up with her oncology team as outpatient especially with history of lung cancer.   Hypomagnesemia Mag 2.0 -- resolved   Sinus tachycardia IV metoprolol as  needed Start low dose metoprolol 25 mg bid since bp ok   Hypoglycemia Resolved    TOC for d/c planning to rehab--[facility] today   Procedures s/p sigmoidectomy with anastomosis and repair of cecal perforation with protective loop ileostomy  creation Family communication :dter [name], husband Consults :gen surgery CODE STATUS: FULL DVT Prophylaxis :enoxaparin     Pain control - [place] Controlled Substance Reporting System database was reviewed. and patient was instructed, not to drive, operate heavy machinery, perform activities at heights, swimming or participation in water activities or provide baby-sitting services while on Pain, Sleep and Anxiety Medications; until their outpatient Physician has advised to do so again. Also recommended to not to take more than prescribed Pain, Sleep and Anxiety Medications.  Disposition: Rehabilitation facility Diet recommendation:  Discharge Diet Orders (From admission, onward)     Start     Ordered   01/30/22 0000  Diet - low sodium heart healthy        01/30/22 0952           Cardiac diet DISCHARGE MEDICATION: Allergies as of 01/30/2022       Reactions   Eryc [erythromycin]    Levofloxacin Nausea Only   Percocet [oxycodone-acetaminophen] Nausea And Vomiting   Augmentin [amoxicillin-pot Clavulanate] Rash   Celecoxib Palpitations        Medication List     TAKE these medications    acetaminophen 500 MG tablet Commonly known as: TYLENOL Take 1,000 mg by mouth daily as needed for pain.   aspirin EC 81 MG tablet Take 81 mg by mouth daily.   Calcium Antacid 500 MG chewable tablet Generic drug: calcium carbonate Chew 2 tablets by mouth daily.   ciprofloxacin 500 MG tablet Commonly known as: CIPRO Take 1 tablet (500 mg total) by mouth 2 (two) times daily for 3 doses.   feeding supplement Liqd Take 237 mLs by mouth 3 (three) times daily between meals.   fluticasone 50 MCG/ACT nasal spray Commonly known as: FLONASE Place 2 sprays into both nostrils daily.   furosemide 20 MG tablet Commonly known as: LASIX Take 20 mg by mouth daily.   lidocaine 2 % solution Commonly known as: XYLOCAINE Use as directed 15 mLs in the mouth or throat every 8 (eight)  hours as needed for mouth pain.   loperamide 2 MG capsule Commonly known as: IMODIUM Take 2 capsules (4 mg total) by mouth every 12 (twelve) hours.   magic mouthwash Soln Take 5 mLs by mouth 4 (four) times daily.   meloxicam 7.5 MG tablet Commonly known as: MOBIC Take 1 tablet by mouth daily.   metoprolol tartrate 25 MG tablet Commonly known as: LOPRESSOR Take 1 tablet (25 mg total) by mouth 2 (two) times daily.   metroNIDAZOLE 500 MG tablet Commonly known as: FLAGYL Take 1 tablet (500 mg total) by mouth every 12 (twelve) hours for 3 doses.   montelukast 10 MG tablet Commonly known as: SINGULAIR Take 10 mg by mouth at bedtime.   nystatin powder Commonly known as: MYCOSTATIN/NYSTOP Apply topically 3 (three) times daily.   omeprazole 20 MG capsule Commonly known as: PRILOSEC Take 20 mg by mouth 2 (two) times daily before a meal.   ondansetron 4 MG tablet Commonly known as: Zofran Take 1 tablet (4 mg total) by mouth daily as needed for nausea or vomiting.   PRESERVISION AREDS PO Take 1 capsule by mouth 2 (two) times daily.   Symbicort 160-4.5 MCG/ACT inhaler Generic drug: budesonide-formoterol Inhale 2 puffs  into the lungs 2 (two) times daily.   Systane 0.4-0.3 % Soln Generic drug: Polyethyl Glycol-Propyl Glycol Apply 1 drop to eye 2 (two) times daily.   traMADol 50 MG tablet Commonly known as: ULTRAM Take 1 tablet (50 mg total) by mouth every 6 (six) hours as needed for severe pain or moderate pain.   VITAMIN B-12 PO Take 2 tablets by mouth daily.   VITAMIN D PO Take 5,000 Units by mouth daily.               Discharge Care Instructions  (From admission, onward)           Start     Ordered   01/30/22 0000  Discharge wound care:       Comments: 01/27/22 0500    Wound care  Daily at 5am      Comments: Cover wound with strips of silver hydrofiber (Aquacel Ag+) cut to fit over midline wound, top with foam. Ok to use gauze (dry) over the penrose  drains.    Change daily  01/26/22 1325     01/30/22 1610            Contact information for follow-up providers     Sparks, Leonie Douglas, MD .   Specialty: Internal Medicine Contact information: [place] [address] [phone]         Herbert Pun, MD Follow up on 02/04/2022.   Specialty: General Surgery Why: If any difficulty with transportation, appointment can be rescheduled for patient conveniance. Contact information: [place] [address] [phone]              Contact information for after-discharge care     Destination     [facility] Preferred SNF .   Service: Skilled Nursing Contact information: [address] [place] [phone]                    Discharge Exam: Danley Danker Weights   01/27/22 0500 01/28/22 0412 01/29/22 0409  Weight: 76 kg 75 kg 84 kg     Condition at discharge: fair  The results of significant diagnostics from this hospitalization (including imaging, microbiology, ancillary and laboratory) are listed below for reference.   Imaging Studies: [place] OR NERVE BLOCK-IMAGE ONLY [facility])  Result Date: 01/24/2022 There is no interpretation for this exam.  This order is for images obtained during a surgical procedure.  Please See "Surgeries" Tab for more information regarding the procedure.   CT ABDOMEN PELVIS W CONTRAST  Result Date: 01/24/2022 CLINICAL DATA:  Postop abdominal pain. Increased white blood cell. Rule out abscess. Surgery 01/20/2022 with sigmoid hemicolectomy, repair cecal perforation in transverse serosal tear and loop ileostomy creation. EXAM: CT ABDOMEN AND PELVIS WITH CONTRAST TECHNIQUE: Multidetector CT imaging of the abdomen and pelvis was performed using the standard protocol following bolus administration of intravenous contrast. RADIATION DOSE REDUCTION: This exam was performed according  to the departmental dose-optimization program which includes automated exposure control, adjustment of the mA and/or kV according to patient size and/or use of iterative reconstruction technique. CONTRAST:  133mL OMNIPAQUE IOHEXOL 300 MG/ML  SOLN COMPARISON:  Preoperative CT 01/18/2022, CT 01/13/2022 FINDINGS: Lower chest: Trace pleural thickening without significant effusion. No basilar airspace disease. Hepatobiliary: Possible mild hepatic steatosis. No focal liver lesion. No biliary dilatation. Mild gallbladder distension but no calcified gallstone or pericholecystic fat stranding. Pancreas: No ductal dilatation or inflammation. Spleen: Normal in size.  Two small linear subcapsular regions in the spleen, 8 mm superiorly series 2, image 20, 13 mm laterally series 2, image 23. These are new from prior exam. There is no adjacent perisplenic fluid or subcapsular hematoma. Adrenals/Urinary Tract: Normal adrenal glands. No hydronephrosis. No focal renal abnormality. Decompressed urinary bladder, not well assessed. Stomach/Bowel: Fluid distended stomach. No gastric wall thickening. Fluid-filled small bowel particularly proximally. There is moderate small bowel wall thickening in the mid abdomen. Mild diffuse small bowel wall enhancement. Interval right lower quadrant loop ileostomy. Decreased colonic distension from preoperative imaging. There is wall thickening of the descending and sigmoid colon. Surgery in the mid sigmoid colon, series 2, image 70. There is mild air adjacent to the suture line posteriorly tracking inferior and superiorly, for example series 2 images 67 through 73 and adjacent. Moderate volume of free air is also seen scattered throughout the anterior abdomen with small volume of free air in the mesentery. There is associated free fluid adjacent to this free air, as well as moderate volume of free fluid throughout the abdomen and pelvis. Vascular/Lymphatic: Aortic atherosclerosis. Patent portal vein.  There is mild generalized mesenteric edema. Limited assessment for adenopathy. Reproductive: Status post hysterectomy. No adnexal masses. Other: Recent midline laparotomy. There is scattered free air, greatest in the mid anterior abdomen, also in the right upper quadrant, scattered throughout the mesentery and in the left pelvis. Moderate volume of free fluid in the lower abdomen and pelvis. Free fluid is mildly complicated in density. Short TE this appears to be non loculated, however there may be developing peripheral enhancement involving fluid in the right pelvis with possible developing collection measuring 6.7 x 3.4 x 11.1 cm series 2, image 67 and series 5, image 69. Small amount of perihepatic fluid, non organized. Diffuse subcutaneous edema of the anterior abdominal wall. No subcutaneous collection. Musculoskeletal: Stable osseous structures. Degenerative change in the spine and hips. IMPRESSION: 1. Recent midline laparotomy with right lower quadrant loop ileostomy. 2. There is a moderate volume of free air in the abdomen. There is air adjacent to the sigmoid suture line, but also throughout the anterior abdomen, and scattered in the mesentery. It is unclear if this represents residual postsurgical air or anastomotic leak. 3. Moderate volume of free fluid in the lower abdomen and pelvis. Majority of this fluid is not loculated, however is mildly complex in density. There may be developing peripheral enhancement involving fluid in the right pelvis with possible developing collection measuring 6.7 x 3.4 x 11.1 cm. 4. Two small linear subcapsular regions in the spleen, new from prior exam, suspicious for small splenic infarcts. No adjacent perisplenic fluid or subcapsular hematoma. 5. Diffuse small bowel enhancement, prominent fluid-filled proximal small bowel and distended stomach. Favor postoperative ileus, although the possibility of enteritis is also considered. Aortic Atherosclerosis (ICD10-I70.0).  Electronically Signed   By: Keith Rake M.D.   On: 01/24/2022 16:34   US ABDOMEN LIMITED RUQ (LIVER/GB)  Result Date: 01/18/2022 CLINICAL DATA:  Upper abdominal pain, worse with eating. EXAM: ULTRASOUND ABDOMEN LIMITED RIGHT UPPER QUADRANT COMPARISON:  CT 01/13/2022 FINDINGS: Gallbladder: Small amount of gallbladder sludge. No gallstones. Wall thickness upper limits of normal. No Murphy sign. Common bile duct: Diameter: 2.4 mm.  Normal. Liver: No focal lesion identified. Within normal limits in parenchymal echogenicity. Portal vein is patent on color Doppler imaging with normal direction of blood flow towards the liver. Other: Tiny amount of ascites noted, etiology unknown. IMPRESSION: Small amount of gallbladder sludge. No gallstones. No ductal  obstruction. Liver parenchyma is normal. Small amount of ascites. Electronically Signed   By: Nelson Chimes M.D.   On: 01/18/2022 16:02   CT ABDOMEN PELVIS WO CONTRAST  Result Date: 01/18/2022 CLINICAL DATA:  Nausea, vomiting, recent GI bleed and hospitalization, bloating, increased pain with eating, diarrhea, had colonoscopy during hospitalization, suspected bowel obstruction EXAM: CT ABDOMEN AND PELVIS WITHOUT CONTRAST TECHNIQUE: Multidetector CT imaging of the abdomen and pelvis was performed following the standard protocol without IV contrast. RADIATION DOSE REDUCTION: This exam was performed according to the departmental dose-optimization program which includes automated exposure control, adjustment of the mA and/or kV according to patient size and/or use of iterative reconstruction technique. COMPARISON:  01/13/2022 FINDINGS: Lower chest: Tiny calcified pulmonary granuloma RIGHT lung base. Nodular density LEFT lower lobe 3 mm diameter image 14 unchanged. Additional small branching density posterior RIGHT lower lobe question mucoid impaction. Hepatobiliary: Increased attenuation within gallbladder, likely vicarious excretion of prior contrast. Single tiny  high attenuation focus dependently question cholelithiasis. Liver unremarkable. Pancreas: Atrophic pancreas without mass Spleen: Normal appearance Adrenals/Urinary Tract: Adrenal glands, kidneys, ureters, and bladder normal appearance Stomach/Bowel: Appendix not visualized. Sigmoid diverticulosis. Dilated colon until mid sigmoid colon increased since previous study. Abrupt change in colonic caliber is identified at mid sigmoid colon, series 2, image 72. This did not appear mass-like on the recent CT exam and appears more prominent on the current study over a length of approximately 6 cm. Colon distal to this site is decompressed. Dilated small bowel loops new since previous exam. Stomach decompressed. Vascular/Lymphatic: Atherosclerotic calcifications aorta without aneurysm. No adenopathy. Reproductive: Uterus surgically absent with nonvisualization of ovaries Other: Small amount of free fluid in pelvis and at RIGHT gutter. No free air. No hernia. Musculoskeletal: Osseous demineralization. IMPRESSION: Increased dilatation of the colon until mid sigmoid colon with abrupt change in colonic caliber over a length of approximately 6 cm, associated with a small amount of free fluid in pelvis and at RIGHT gutter as well as diffuse small bowel dilatation. Findings are most consistent with sigmoid colonic obstruction, question stricture, with no definite mass seen at this site by sigmoidoscopy or prior CT. Sigmoid diverticulosis without evidence of diverticulitis. Small amount of free intraperitoneal fluid without free air. Aortic Atherosclerosis (ICD10-I70.0). Electronically Signed   By: Lavonia Dana M.D.   On: 01/18/2022 15:28   CT Angio Abd/Pel w/ and/or w/o  Result Date: 01/13/2022 CLINICAL DATA:  81 year old female with a history of lower GI bleeding EXAM: CTA ABDOMEN AND PELVIS WITHOUT AND WITH CONTRAST TECHNIQUE: Multidetector CT imaging of the abdomen and pelvis was performed using the standard protocol during  bolus administration of intravenous contrast. Multiplanar reconstructed images and MIPs were obtained and reviewed to evaluate the vascular anatomy. RADIATION DOSE REDUCTION: This exam was performed according to the departmental dose-optimization program which includes automated exposure control, adjustment of the mA and/or kV according to patient size and/or use of iterative reconstruction technique. CONTRAST:  156mL OMNIPAQUE IOHEXOL 350 MG/ML SOLN COMPARISON:  01/13/2022, 06/23/2017 FINDINGS: VASCULAR Aorta: Mild atherosclerotic changes of the lower thoracic and the abdominal aorta. No pedunculated plaque, ulcerated plaque, dissection, periaortic fluid or inflammatory changes. No aneurysm. Celiac: Celiac artery patent without significant atherosclerotic changes. Typical branching of the celiac artery into the left gastric artery, common hepatic artery, splenic artery. Rim calcified small aneurysm of the splenic artery at the hilum of the spleen, 7 mm. This appears unchanged dating to the CT 06/23/2017 SMA: SMA patent with minimal atherosclerosis at the origin. Renals: -  Right: Single right renal artery, patent. No significant atherosclerotic changes. - Left: Single left renal artery. Mild atherosclerosis at the origin without evidence of high-grade stenosis. IMA: Inferior mesenteric artery is patent. Right lower extremity: Unremarkable course, caliber, and contour of the right iliac system. No aneurysm, dissection, or occlusion. Hypogastric artery is patent. Common femoral artery patent. Proximal SFA and profunda femoris patent. Left lower extremity: Unremarkable course, caliber, and contour of the left iliac system. No aneurysm, dissection, or occlusion. Hypogastric artery is patent. Common femoral artery patent. Proximal SFA and profunda femoris patent. Veins: Unremarkable appearance of the venous system. Review of the MIP images confirms the above findings. NON-VASCULAR Lower chest: No acute finding of the  lower chest. Small pulmonary nodules as identified on earlier CT. Hepatobiliary: Unremarkable appearance of the liver. Unremarkable gall bladder. Pancreas: Unremarkable. Spleen: Unremarkable. Adrenals/Urinary Tract: - Right adrenal gland: Unremarkable - Left adrenal gland: Unremarkable. - Right kidney: No hydronephrosis, nephrolithiasis, inflammation, or ureteral dilation. No focal lesion. - Left Kidney: No hydronephrosis, nephrolithiasis, inflammation, or ureteral dilation. No focal lesion. - Urinary Bladder: Urinary bladder with excreted contrast. Stomach/Bowel: - Stomach: Small hiatal hernia. Otherwise unremarkable stomach with no evidence of puddling of contrast. - Small bowel: Small bowel unremarkable. No abnormal distension. No transition point. No wall thickening. No accumulation of contrast within the lumen. - Appendix: Appendix is not visualized, however, no inflammatory changes are present adjacent to the cecum to indicate an appendicitis. - Colon: Minimal stool burden. Gaseous distension through the length of the colon without a transition point or focal obstruction. Relative decompression in the sigmoid colon. Redemonstration of advanced sigmoid diverticular disease. No acute inflammation. No accumulation of contrast within the lumen of the colon. Lymphatic: No adenopathy. Mesenteric: No free fluid or air. No mesenteric adenopathy. Reproductive: Hysterectomy Other: Fat containing umbilical hernia. Musculoskeletal: No displaced fracture. Multilevel degenerative changes of the spine. Mild degenerative changes of the hips. IMPRESSION: The CT angiogram is negative for acute gastrointestinal hemorrhage. Advanced left-sided diverticular disease again noted without evidence of acute diverticulitis. Gaseous distension of the colon, without evidence of obstruction. 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 Aortic Atherosclerosis (ICD10-I70.0). Additional  ancillary findings as above. Signed, Dulcy Fanny. Nadene Rubins, RPVI [facility] Electronically Signed   By: Corrie Mckusick D.O.   On: 01/13/2022 16:58   CT ABDOMEN PELVIS W CONTRAST  Result Date: 01/13/2022 CLINICAL DATA:  81 year old female with history of left lower quadrant abdominal pain. Bright red blood in stool. EXAM: CT ABDOMEN AND PELVIS WITH CONTRAST TECHNIQUE: Multidetector CT imaging of the abdomen and pelvis was performed using the standard protocol following bolus administration of intravenous contrast. RADIATION DOSE REDUCTION: This exam was performed according to the departmental dose-optimization program which includes automated exposure control, adjustment of the mA and/or kV according to patient size and/or use of iterative reconstruction technique. CONTRAST:  170mL OMNIPAQUE IOHEXOL 300 MG/ML  SOLN COMPARISON:  CT of the abdomen and pelvis 06/23/2017. FINDINGS: Lower chest: Multiple small pulmonary nodules measuring 4 mm or less in size are noted in the visualized lung bases. Atherosclerotic calcifications in the descending thoracic aorta. Hepatobiliary: No suspicious cystic or solid hepatic lesions. No intra or extrahepatic biliary ductal dilatation. Gallbladder is normal in appearance. Pancreas: No pancreatic mass. No pancreatic ductal dilatation. No pancreatic or peripancreatic fluid collections or inflammatory changes. Spleen: Unremarkable. Adrenals/Urinary Tract: Bilateral kidneys and adrenal glands are normal in appearance. No hydroureteronephrosis.  Urinary bladder is normal in appearance. Stomach/Bowel: The appearance of the stomach is normal. There is no pathologic dilatation of small bowel or colon. Numerous colonic diverticula are noted, particularly in the region of the sigmoid colon, without surrounding inflammatory changes to suggest an acute diverticulitis at this time. The appendix is not confidently identified and may  be surgically absent. Regardless, there are no inflammatory changes noted adjacent to the cecum to suggest the presence of an acute appendicitis at this time. Vascular/Lymphatic: Atherosclerosis in the abdominal aorta and pelvic vasculature, without evidence of aneurysm or dissection. No lymphadenopathy noted in the abdomen or pelvis. Reproductive: Status post hysterectomy. Ovaries are not confidently identified may be surgically absent or atrophic. Other: No significant volume of ascites.  No pneumoperitoneum. Musculoskeletal: There are no aggressive appearing lytic or blastic lesions noted in the visualized portions of the skeleton. IMPRESSION: 1. 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 2. No other acute findings are noted elsewhere in the abdomen or pelvis to account for the patient's symptoms. 3. Aortic atherosclerosis. 4. Small pulmonary nodules measuring 4 mm or less in size in the visualize lung bases. This is nonspecific, but statistically likely benign. No follow-up needed if patient is low-risk (and has no known or suspected primary neoplasm). Non-contrast chest CT can be considered in 12 months if patient is high-risk. This recommendation follows the consensus statement: Guidelines for Management of Incidental Pulmonary Nodules Detected on CT Images: From the Fleischner Society 2017; Radiology 2017; 284:228-243. Electronically Signed   By: Vinnie Langton M.D.   On: 01/13/2022 05:14    Microbiology: Results for orders placed or performed during the hospital encounter of 01/18/22  Aerobic/Anaerobic Culture w Gram Stain (surgical/deep wound)     Status: None   Collection Time: 01/24/22  6:20 PM   Specimen: PATH Cytology Misc. fluid; Body Fluid  Result Value Ref Range Status   Specimen Description FLUID INTRA ABDOMINAL  Final   Special Requests ON SWAB  Final   Gram Stain   Final    FEW WBC PRESENT,BOTH PMN  AND MONONUCLEAR FEW GRAM POSITIVE COCCI IN PAIRS RARE GRAM VARIABLE ROD    Culture   Final    RARE PSEUDOMONAS AERUGINOSA RARE BACTEROIDES VULGATUS BETA LACTAMASE POSITIVE Performed at [facility], [place] [address] [place], [place] [address]    Report Status 01/29/2022 FINAL  Final   Organism ID, Bacteria PSEUDOMONAS AERUGINOSA  Final      Susceptibility   Pseudomonas aeruginosa - MIC*    CEFTAZIDIME 4 SENSITIVE Sensitive     CIPROFLOXACIN <=0.25 SENSITIVE Sensitive     GENTAMICIN 2 SENSITIVE Sensitive     IMIPENEM 1 SENSITIVE Sensitive     PIP/TAZO 8 SENSITIVE Sensitive     CEFEPIME 2 SENSITIVE Sensitive     * RARE PSEUDOMONAS AERUGINOSA    Labs: CBC: Recent Labs  Lab 01/24/22 0842 01/24/22 2014 01/25/22 0050 01/25/22 0545  WBC 16.1*  --  16.7* 15.9*  NEUTROABS 14.8*  --   --   --   HGB 12.4 10.5* 10.6* 10.3*  HCT 38.5 31.0* 34.8* 32.4*  MCV 90.6  --  96.7 92.6  PLT 218  --  216 222   Basic Metabolic Panel: Recent Labs  Lab 01/25/22 0545 01/26/22 0416 01/27/22 0946 01/28/22 0618 01/29/22 0905 01/29/22 1412 01/30/22 0448  NA 135 137 138  --  136  --  135  K 5.4* 4.1 4.7  --  3.2* 3.7 3.8  CL 109 110 112*  --  105  --  106  CO2 20* 23 21*  --  22  --  26  GLUCOSE 172* 207* 108*  --  121*  --  111*  BUN 16 15 12   --  12  --  14  CREATININE 0.67 0.61 0.43*  --  0.42*  --  0.38*  CALCIUM 7.8* 7.6* 7.8*  --  7.8*  --  7.6*  MG  --  1.6* 1.8 2.0 1.9  --  2.2  PHOS  --  2.7  --  2.4* 3.1  --  3.3   Liver Function Tests: No results for input(s): "AST", "ALT", "ALKPHOS", "BILITOT", "PROT", "ALBUMIN" in the last 168 hours. CBG: Recent Labs  Lab 01/25/22 0756 01/26/22 0802 01/28/22 0747 01/29/22 0817 01/30/22 0750  GLUCAP 160* 181* 98 118* 87    Discharge time spent: greater than 30 minutes.  Signed: [name], MD [facility] 01/30/2022

## 2022-01-30 NOTE — Progress Notes (Signed)
Patient ID: Lindsey Thomas, female   DOB: 1941-02-28, 81 y.o.   MRN: [id number]     [place] [facility] Day(s): 12.   Interval History: Patient seen and examined, no acute events or new complaints overnight. Patient reports having pain on her back and bottom part.  Denies abdominal pain.  Denies any nausea or vomiting.  Endorses tolerating diet.  Still not eating a whole lot but eating solid food.  Most pain is improving.  Vital signs in last 24 hours: [min-max] current  Temp:  [97.8 F (36.6 C)-98.3 F (36.8 C)] 98.2 F (36.8 C) (08/26 0749) Pulse Rate:  [101-110] 101 (08/26 0749) Resp:  [18] 18 (08/26 0749) BP: (104-138)/(49-55) 109/53 (08/26 0749) SpO2:  [88 %-98 %] 98 % (08/26 0749)     Height: 4\' 9"  (144.8 cm) Weight: 84 kg BMI (Calculated): 40.06   Physical Exam:  Constitutional: alert, cooperative and no distress  Respiratory: breathing non-labored at rest  Cardiovascular: regular rate and sinus rhythm  Gastrointestinal: soft, non-tender, and non-distended.  The wound is adequately dressed.  Ileostomy pink and patent.  Labs:     Latest Ref Rng & Units 01/25/2022    5:45 AM 01/25/2022   12:50 AM 01/24/2022    8:14 PM  CBC  WBC 4.0 - 10.5 K/uL 15.9  16.7    Hemoglobin 12.0 - 15.0 g/dL 10.3  10.6  10.5   Hematocrit 36.0 - 46.0 % 32.4  34.8  31.0   Platelets 150 - 400 K/uL 199  216        Latest Ref Rng & Units 01/30/2022    4:48 AM 01/29/2022    2:12 PM 01/29/2022    9:05 AM  CMP  Glucose 70 - 99 mg/dL 111   121   BUN 8 - 23 mg/dL 14   12   Creatinine 0.44 - 1.00 mg/dL 0.38   0.42   Sodium 135 - 145 mmol/L 135   136   Potassium 3.5 - 5.1 mmol/L 3.8  3.7  3.2   Chloride 98 - 111 mmol/L 106   105   CO2 22 - 32 mmol/L 26   22   Calcium 8.9 - 10.3 mg/dL 7.6   7.8     Imaging studies: No new pertinent imaging studies   Assessment/Plan:  81 y.o. 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   Complicated with colorectal anastomosis 6 Day Post-Op s/p subtotal colectomy   -Patient with stable vital signs,. -Heart rate continues in the low 100s. - End ileostomy is pink and patent with high ileostomy output.  Continue increasing Imodium.  Imodium can be increased as increment of 2 mg daily.  Today she should receive 8 mg.  I really we will like to keep ileostomy output under 1.5 L. -Patient can be advanced to regular diet -Pelvic drain continue with intermittent stool output.  This is able to be clear with milking of the drain.  This should resolve spontaneously. -No contraindications to discharge to skilled nursing facility from a standpoint  -Depending of how this is to transfer patient to my clinic at the end of the week I will try to see her Thursday for wound check.  If transportation is the thecal because of the limitation or pain these appointment can be rearranged at patient  convenience.  Severely debilitated patient - I encourage the patient to engage with PT/OT -Advancing diet for better nutrition -Agree with recommendation of discharge to skilled nursing facility.   Oral thrush -Tube was causing significant mouth pain unable to eat solid diet.  Patient started on nystatin.  Today feeling better.  Hopefully she will be able to eat better today.  Arnold Long, MD

## 2022-01-30 NOTE — TOC Progression Note (Signed)
Transition of Care [facility]) - Progression Note    Patient Details  Name: Lindsey Thomas MRN: [id number] Date of Birth: 1941-05-25  Transition of Care [facility]) CM/SW [place], LCSW Phone Number: 01/30/2022, 10:28 AM  Clinical Narrative:    Reached out to [facility] asking for room #.   10:28- Reached out to [facility] again, waiting on response. DC Summary has been sent in the [place].   Expected Discharge Plan: [place] Barriers to Discharge: Continued Medical Work up  Expected Discharge Plan and Services Expected Discharge Plan: [place]   Discharge Planning Services: CM Consult Post Acute Care Choice: [place] (vs home health if improved.) Living arrangements for the past 2 months: Single Family Home Expected Discharge Date: 01/30/22                                     Social Determinants of Health (SDOH) Interventions    Readmission Risk Interventions     No data to display

## 2022-01-30 NOTE — TOC Transition Note (Signed)
Transition of Care [facility]) - CM/SW Discharge Note   Patient Details  Name: Lindsey Thomas MRN: [id number] Date of Birth: December 27, 1940  [facility] CM/SW Contact:  Magnus Ivan, LCSW Phone Number: 01/30/2022, 12:21 PM   Clinical Narrative:    Discharge to [facility] in [place] today. Room 607A. Confirmed with Admissions Worker Tammy. Updated MD, RN, and patient's daughter [name]). Asked RN to call report. EMS paperwork completed. [facility] called, patient is 2nd on list as of 12:20, RN notified.    Final next level of care: Skilled Nursing Facility Barriers to Discharge: Barriers Resolved   Patient Goals and CMS Choice Patient states their goals for this hospitalization and ongoing recovery are:: [facility] CMS Medicare.gov Compare Post Acute Care list provided to:: Patient Represenative (must comment) Choice offered to / list presented to : Adult Children  Discharge Placement              Patient chooses bed at: [facility] [place] Patient to be transferred to facility by: [facility] Name of family member notified: [name] Patient and family notified of of transfer: 01/30/22  Discharge Plan and Services   Discharge Planning Services: CM Consult Post Acute Care Choice: [place] (vs home health if improved.)                               Social Determinants of Health (SDOH) Interventions     Readmission Risk Interventions     No data to display

## 2022-02-01 DIAGNOSIS — K56691 Other complete intestinal obstruction: Secondary | ICD-10-CM | POA: Diagnosis not present

## 2022-02-01 DIAGNOSIS — M6281 Muscle weakness (generalized): Secondary | ICD-10-CM | POA: Diagnosis not present

## 2022-02-01 DIAGNOSIS — J45909 Unspecified asthma, uncomplicated: Secondary | ICD-10-CM | POA: Diagnosis not present

## 2022-02-01 DIAGNOSIS — I890 Lymphedema, not elsewhere classified: Secondary | ICD-10-CM | POA: Diagnosis not present

## 2022-02-04 DIAGNOSIS — K56691 Other complete intestinal obstruction: Secondary | ICD-10-CM | POA: Diagnosis not present

## 2022-02-04 DIAGNOSIS — I10 Essential (primary) hypertension: Secondary | ICD-10-CM | POA: Diagnosis not present

## 2022-02-09 ENCOUNTER — Ambulatory Visit (INDEPENDENT_AMBULATORY_CARE_PROVIDER_SITE_OTHER): Payer: Medicare HMO | Admitting: Vascular Surgery

## 2022-02-10 DIAGNOSIS — Z932 Ileostomy status: Secondary | ICD-10-CM | POA: Diagnosis not present

## 2022-02-10 DIAGNOSIS — I10 Essential (primary) hypertension: Secondary | ICD-10-CM | POA: Diagnosis not present

## 2022-02-10 DIAGNOSIS — I890 Lymphedema, not elsewhere classified: Secondary | ICD-10-CM | POA: Diagnosis not present

## 2022-02-10 DIAGNOSIS — K631 Perforation of intestine (nontraumatic): Secondary | ICD-10-CM | POA: Diagnosis not present

## 2022-02-10 DIAGNOSIS — K5732 Diverticulitis of large intestine without perforation or abscess without bleeding: Secondary | ICD-10-CM | POA: Diagnosis not present

## 2022-02-10 DIAGNOSIS — K56691 Other complete intestinal obstruction: Secondary | ICD-10-CM | POA: Diagnosis not present

## 2022-02-10 DIAGNOSIS — K56609 Unspecified intestinal obstruction, unspecified as to partial versus complete obstruction: Secondary | ICD-10-CM | POA: Diagnosis not present

## 2022-02-12 DIAGNOSIS — K56691 Other complete intestinal obstruction: Secondary | ICD-10-CM | POA: Diagnosis not present

## 2022-02-12 DIAGNOSIS — I10 Essential (primary) hypertension: Secondary | ICD-10-CM | POA: Diagnosis not present

## 2022-02-15 DIAGNOSIS — D72829 Elevated white blood cell count, unspecified: Secondary | ICD-10-CM | POA: Diagnosis not present

## 2022-02-15 DIAGNOSIS — K56691 Other complete intestinal obstruction: Secondary | ICD-10-CM | POA: Diagnosis not present

## 2022-02-15 DIAGNOSIS — D649 Anemia, unspecified: Secondary | ICD-10-CM | POA: Diagnosis not present

## 2022-02-16 ENCOUNTER — Other Ambulatory Visit: Payer: Self-pay | Admitting: General Surgery

## 2022-02-16 DIAGNOSIS — K651 Peritoneal abscess: Secondary | ICD-10-CM

## 2022-02-16 DIAGNOSIS — K56691 Other complete intestinal obstruction: Secondary | ICD-10-CM | POA: Diagnosis not present

## 2022-02-17 ENCOUNTER — Emergency Department: Payer: Medicare HMO

## 2022-02-17 ENCOUNTER — Other Ambulatory Visit: Payer: Self-pay

## 2022-02-17 ENCOUNTER — Inpatient Hospital Stay
Admission: EM | Admit: 2022-02-17 | Discharge: 2022-02-20 | DRG: 862 | Disposition: A | Discharge status: Home Health | Payer: Medicare HMO | Attending: Internal Medicine | Admitting: Internal Medicine

## 2022-02-17 DIAGNOSIS — E7800 Pure hypercholesterolemia, unspecified: Secondary | ICD-10-CM | POA: Diagnosis present

## 2022-02-17 DIAGNOSIS — Z6837 Body mass index (BMI) 37.0-37.9, adult: Secondary | ICD-10-CM

## 2022-02-17 DIAGNOSIS — Z885 Allergy status to narcotic agent status: Secondary | ICD-10-CM

## 2022-02-17 DIAGNOSIS — L89312 Pressure ulcer of right buttock, stage 2: Secondary | ICD-10-CM | POA: Diagnosis present

## 2022-02-17 DIAGNOSIS — D649 Anemia, unspecified: Secondary | ICD-10-CM | POA: Diagnosis present

## 2022-02-17 DIAGNOSIS — Z881 Allergy status to other antibiotic agents status: Secondary | ICD-10-CM

## 2022-02-17 DIAGNOSIS — E041 Nontoxic single thyroid nodule: Secondary | ICD-10-CM | POA: Diagnosis present

## 2022-02-17 DIAGNOSIS — E871 Hypo-osmolality and hyponatremia: Secondary | ICD-10-CM | POA: Diagnosis not present

## 2022-02-17 DIAGNOSIS — J9811 Atelectasis: Secondary | ICD-10-CM | POA: Diagnosis not present

## 2022-02-17 DIAGNOSIS — Z801 Family history of malignant neoplasm of trachea, bronchus and lung: Secondary | ICD-10-CM

## 2022-02-17 DIAGNOSIS — J439 Emphysema, unspecified: Secondary | ICD-10-CM | POA: Diagnosis present

## 2022-02-17 DIAGNOSIS — Z825 Family history of asthma and other chronic lower respiratory diseases: Secondary | ICD-10-CM

## 2022-02-17 DIAGNOSIS — E860 Dehydration: Secondary | ICD-10-CM | POA: Diagnosis present

## 2022-02-17 DIAGNOSIS — L89152 Pressure ulcer of sacral region, stage 2: Secondary | ICD-10-CM | POA: Diagnosis present

## 2022-02-17 DIAGNOSIS — Z880 Allergy status to penicillin: Secondary | ICD-10-CM | POA: Diagnosis not present

## 2022-02-17 DIAGNOSIS — I82469 Acute embolism and thrombosis of unspecified calf muscular vein: Secondary | ICD-10-CM | POA: Diagnosis not present

## 2022-02-17 DIAGNOSIS — A419 Sepsis, unspecified organism: Secondary | ICD-10-CM | POA: Diagnosis not present

## 2022-02-17 DIAGNOSIS — K219 Gastro-esophageal reflux disease without esophagitis: Secondary | ICD-10-CM | POA: Diagnosis present

## 2022-02-17 DIAGNOSIS — D62 Acute posthemorrhagic anemia: Secondary | ICD-10-CM | POA: Diagnosis not present

## 2022-02-17 DIAGNOSIS — L02211 Cutaneous abscess of abdominal wall: Secondary | ICD-10-CM | POA: Diagnosis not present

## 2022-02-17 DIAGNOSIS — R5381 Other malaise: Secondary | ICD-10-CM | POA: Diagnosis not present

## 2022-02-17 DIAGNOSIS — E876 Hypokalemia: Secondary | ICD-10-CM | POA: Diagnosis not present

## 2022-02-17 DIAGNOSIS — T8143XA Infection following a procedure, organ and space surgical site, initial encounter: Principal | ICD-10-CM | POA: Diagnosis present

## 2022-02-17 DIAGNOSIS — K56691 Other complete intestinal obstruction: Secondary | ICD-10-CM | POA: Diagnosis not present

## 2022-02-17 DIAGNOSIS — E039 Hypothyroidism, unspecified: Secondary | ICD-10-CM | POA: Diagnosis present

## 2022-02-17 DIAGNOSIS — Z85118 Personal history of other malignant neoplasm of bronchus and lung: Secondary | ICD-10-CM | POA: Diagnosis not present

## 2022-02-17 DIAGNOSIS — Y838 Other surgical procedures as the cause of abnormal reaction of the patient, or of later complication, without mention of misadventure at the time of the procedure: Secondary | ICD-10-CM | POA: Diagnosis present

## 2022-02-17 DIAGNOSIS — Z932 Ileostomy status: Secondary | ICD-10-CM | POA: Diagnosis not present

## 2022-02-17 DIAGNOSIS — G4730 Sleep apnea, unspecified: Secondary | ICD-10-CM | POA: Diagnosis present

## 2022-02-17 DIAGNOSIS — R109 Unspecified abdominal pain: Secondary | ICD-10-CM | POA: Diagnosis not present

## 2022-02-17 DIAGNOSIS — R1084 Generalized abdominal pain: Secondary | ICD-10-CM

## 2022-02-17 DIAGNOSIS — E669 Obesity, unspecified: Secondary | ICD-10-CM | POA: Diagnosis present

## 2022-02-17 DIAGNOSIS — K651 Peritoneal abscess: Secondary | ICD-10-CM | POA: Diagnosis not present

## 2022-02-17 DIAGNOSIS — Z791 Long term (current) use of non-steroidal anti-inflammatories (NSAID): Secondary | ICD-10-CM

## 2022-02-17 DIAGNOSIS — Z79899 Other long term (current) drug therapy: Secondary | ICD-10-CM

## 2022-02-17 DIAGNOSIS — L8990 Pressure ulcer of unspecified site, unspecified stage: Secondary | ICD-10-CM | POA: Diagnosis present

## 2022-02-17 DIAGNOSIS — L89322 Pressure ulcer of left buttock, stage 2: Secondary | ICD-10-CM | POA: Diagnosis present

## 2022-02-17 DIAGNOSIS — Z7951 Long term (current) use of inhaled steroids: Secondary | ICD-10-CM

## 2022-02-17 DIAGNOSIS — L0291 Cutaneous abscess, unspecified: Principal | ICD-10-CM

## 2022-02-17 DIAGNOSIS — Z886 Allergy status to analgesic agent status: Secondary | ICD-10-CM

## 2022-02-17 DIAGNOSIS — Z808 Family history of malignant neoplasm of other organs or systems: Secondary | ICD-10-CM

## 2022-02-17 DIAGNOSIS — R946 Abnormal results of thyroid function studies: Secondary | ICD-10-CM | POA: Diagnosis not present

## 2022-02-17 LAB — CBC WITH DIFFERENTIAL/PLATELET
Abs Immature Granulocytes: 0.43 10*3/uL — ABNORMAL HIGH (ref 0.00–0.07)
Basophils Absolute: 0.1 10*3/uL (ref 0.0–0.1)
Basophils Relative: 1 %
Eosinophils Absolute: 0.1 10*3/uL (ref 0.0–0.5)
Eosinophils Relative: 0 %
HCT: 24.8 % — ABNORMAL LOW (ref 36.0–46.0)
Hemoglobin: 8 g/dL — ABNORMAL LOW (ref 12.0–15.0)
Immature Granulocytes: 3 %
Lymphocytes Relative: 5 %
Lymphs Abs: 0.8 10*3/uL (ref 0.7–4.0)
MCH: 28 pg (ref 26.0–34.0)
MCHC: 32.3 g/dL (ref 30.0–36.0)
MCV: 86.7 fL (ref 80.0–100.0)
Monocytes Absolute: 1 10*3/uL (ref 0.1–1.0)
Monocytes Relative: 7 %
Neutro Abs: 12.6 10*3/uL — ABNORMAL HIGH (ref 1.7–7.7)
Neutrophils Relative %: 84 %
Platelets: 279 10*3/uL (ref 150–400)
RBC: 2.86 MIL/uL — ABNORMAL LOW (ref 3.87–5.11)
RDW: 14.6 % (ref 11.5–15.5)
WBC: 15 10*3/uL — ABNORMAL HIGH (ref 4.0–10.5)
nRBC: 0 % (ref 0.0–0.2)

## 2022-02-17 LAB — COMPREHENSIVE METABOLIC PANEL
ALT: 9 U/L (ref 0–44)
AST: 12 U/L — ABNORMAL LOW (ref 15–41)
Albumin: 2 g/dL — ABNORMAL LOW (ref 3.5–5.0)
Alkaline Phosphatase: 77 U/L (ref 38–126)
Anion gap: 10 (ref 5–15)
BUN: 9 mg/dL (ref 8–23)
CO2: 20 mmol/L — ABNORMAL LOW (ref 22–32)
Calcium: 7.8 mg/dL — ABNORMAL LOW (ref 8.9–10.3)
Chloride: 99 mmol/L (ref 98–111)
Creatinine, Ser: 0.52 mg/dL (ref 0.44–1.00)
GFR, Estimated: >60 mL/min (ref >60)
Glucose, Bld: 138 mg/dL — ABNORMAL HIGH (ref 70–99)
Potassium: 3.5 mmol/L (ref 3.5–5.1)
Sodium: 129 mmol/L — ABNORMAL LOW (ref 135–145)
Total Bilirubin: 0.3 mg/dL (ref 0.3–1.2)
Total Protein: 5.4 g/dL — ABNORMAL LOW (ref 6.5–8.1)

## 2022-02-17 LAB — PROTIME-INR
INR: 1.3 — ABNORMAL HIGH (ref 0.8–1.2)
Prothrombin Time: 15.6 seconds — ABNORMAL HIGH (ref 11.4–15.2)

## 2022-02-17 LAB — T4, FREE: Free T4: 1.46 ng/dL — ABNORMAL HIGH (ref 0.61–1.12)

## 2022-02-17 LAB — LACTIC ACID, PLASMA
Lactic Acid, Venous: 0.9 mmol/L (ref 0.5–1.9)
Lactic Acid, Venous: 1.9 mmol/L (ref 0.5–1.9)

## 2022-02-17 LAB — TSH: TSH: 5.358 u[IU]/mL — ABNORMAL HIGH (ref 0.350–4.500)

## 2022-02-17 MED ORDER — LACTATED RINGERS IV BOLUS
1000.0000 mL | Freq: Once | INTRAVENOUS | Status: AC
  Administered 2022-02-17: 1000 mL via INTRAVENOUS

## 2022-02-17 MED ORDER — VANCOMYCIN HCL IN DEXTROSE 1-5 GM/200ML-% IV SOLN
1000.0000 mg | INTRAVENOUS | Status: DC
  Administered 2022-02-19: 1000 mg via INTRAVENOUS
  Filled 2022-02-17: qty 200

## 2022-02-17 MED ORDER — MONTELUKAST SODIUM 10 MG PO TABS
10.0000 mg | ORAL_TABLET | Freq: Every day | ORAL | Status: DC
  Administered 2022-02-17 – 2022-02-19 (×3): 10 mg via ORAL
  Filled 2022-02-17 (×3): qty 1

## 2022-02-17 MED ORDER — LIDOCAINE VISCOUS HCL 2 % MT SOLN: 15.0000 mL | Freq: Three times a day (TID) | OROMUCOSAL | Status: DC | PRN

## 2022-02-17 MED ORDER — POLYVINYL ALCOHOL 1.4 % OP SOLN
1.0000 [drp] | Freq: Two times a day (BID) | OPHTHALMIC | Status: DC
  Administered 2022-02-18 – 2022-02-20 (×5): 1 [drp] via OPHTHALMIC
  Filled 2022-02-17 (×2): qty 15

## 2022-02-17 MED ORDER — ASPIRIN 81 MG PO TBEC
81.0000 mg | DELAYED_RELEASE_TABLET | Freq: Every day | ORAL | Status: DC
  Administered 2022-02-19 – 2022-02-20 (×2): 81 mg via ORAL
  Filled 2022-02-17 (×2): qty 1

## 2022-02-17 MED ORDER — IOHEXOL 300 MG/ML  SOLN
100.0000 mL | Freq: Once | INTRAMUSCULAR | Status: AC | PRN
  Administered 2022-02-17: 100 mL via INTRAVENOUS

## 2022-02-17 MED ORDER — LACTATED RINGERS IV SOLN: INTRAVENOUS | Status: DC

## 2022-02-17 MED ORDER — SODIUM CHLORIDE 0.9 % IV SOLN
2.0000 g | Freq: Once | INTRAVENOUS | Status: AC
  Administered 2022-02-17: 2 g via INTRAVENOUS
  Filled 2022-02-17: qty 12.5

## 2022-02-17 MED ORDER — LACTATED RINGERS IV BOLUS (SEPSIS)
500.0000 mL | Freq: Once | INTRAVENOUS | Status: AC
Start: 2022-02-17 — End: 2022-02-17
  Administered 2022-02-17: 500 mL via INTRAVENOUS

## 2022-02-17 MED ORDER — ONDANSETRON HCL 4 MG PO TABS: 4.0000 mg | ORAL_TABLET | Freq: Every day | ORAL | Status: DC | PRN

## 2022-02-17 MED ORDER — FLUTICASONE PROPIONATE 50 MCG/ACT NA SUSP: 2.0000 | Freq: Every day | NASAL | Status: DC | PRN

## 2022-02-17 MED ORDER — METOPROLOL TARTRATE 25 MG PO TABS
25.0000 mg | ORAL_TABLET | Freq: Two times a day (BID) | ORAL | Status: DC
  Administered 2022-02-17 – 2022-02-20 (×4): 25 mg via ORAL
  Filled 2022-02-17 (×4): qty 1

## 2022-02-17 MED ORDER — PANTOPRAZOLE SODIUM 40 MG IV SOLR
40.0000 mg | Freq: Two times a day (BID) | INTRAVENOUS | Status: DC
  Administered 2022-02-17 – 2022-02-20 (×6): 40 mg via INTRAVENOUS
  Filled 2022-02-17 (×6): qty 10

## 2022-02-17 MED ORDER — VANCOMYCIN HCL 1750 MG/350ML IV SOLN
1750.0000 mg | Freq: Once | INTRAVENOUS | Status: AC
  Administered 2022-02-17: 1750 mg via INTRAVENOUS
  Filled 2022-02-17: qty 350

## 2022-02-17 MED ORDER — SODIUM CHLORIDE 0.9 % IV SOLN
2.0000 g | Freq: Two times a day (BID) | INTRAVENOUS | Status: DC
  Administered 2022-02-18 – 2022-02-20 (×5): 2 g via INTRAVENOUS
  Filled 2022-02-17 (×6): qty 12.5

## 2022-02-17 NOTE — H&P (Signed)
History and Physical    Lindsey Thomas BJY:[id number] DOB: 1940/11/08 DOA: 02/17/2022  PCP: Idelle Crouch, MD    Patient coming from:  Home    Chief Complaint:  Abdominal pain.    HPI:  Lindsey Thomas is a 81 y.o. female seen in ed for abd pain post procedure for sigmoid colon stricture small bowel obstruction complicated by perforation status post colectomy and ileostomy with abscess status postdrainage and colectomy now presenting with purulent discharge around the drainage sites . Upon arrival case was discussed with general surgery Dr. Peyton Najjar was agreed to consult and follow the patient with admission to medicine service and starting empiric broad-spectrum antibiotics and n.p.o. after midnight.  Pt has past medical history of as below.  Past Medical History:  Diagnosis Date   Anxiety    Arthritis    Asthma    DVT (deep venous thrombosis) (HCC)    Edema    feet   Emphysema of lung (HCC)    GERD (gastroesophageal reflux disease)    Headache    MIGRAINES   History of orthopnea    Hypercholesterolemia    Hypothyroidism    NODULES   Lymphedema    Migraine    Non-small cell lung cancer, right ([place]) 05/2019   Rad tx's   Shortness of breath dyspnea    WITH EXERTION   Sleep apnea    MILD, DOES NOT USE CPAP   Thyroid nodule     Past Surgical History:  Procedure Laterality Date   ABDOMINAL HYSTERECTOMY     BREAST BIOPSY     CARDIAC CATHETERIZATION     CATARACT EXTRACTION W/PHACO Left 02/12/2016   Procedure: CATARACT EXTRACTION PHACO AND INTRAOCULAR LENS PLACEMENT ([place]);  Surgeon: Eulogio Bear, MD;  Location: [facility];  Service: Ophthalmology;  Laterality: Left;  [place] 01:30 AP% 15.2 CDE 13.71 Fluid pack lot # [id number]   CATARACT EXTRACTION W/PHACO Right 03/18/2016   Procedure: CATARACT EXTRACTION PHACO AND INTRAOCULAR LENS PLACEMENT ([facility]);  Surgeon: Eulogio Bear, MD;  Location: [facility];  Service: Ophthalmology;  Laterality: Right;  Lot #  [id number] [place]: 01:05.5 AP%:14.3 CDE: 9.33   COLECTOMY WITH COLOSTOMY CREATION/HARTMANN PROCEDURE N/A 01/20/2022   Procedure: COLECTOMY WITH COLOSTOMY CREATION/HARTMANN PROCEDURE;  Surgeon: Herbert Pun, MD;  Location: [facility];  Service: General;  Laterality: N/A;   FLEXIBLE SIGMOIDOSCOPY N/A 01/14/2022   Procedure: FLEXIBLE SIGMOIDOSCOPY;  Surgeon: Toledo, Benay Pike, MD;  Location: [facility];  Service: Gastroenterology;  Laterality: N/A;   FLEXIBLE SIGMOIDOSCOPY N/A 01/19/2022   Procedure: FLEXIBLE SIGMOIDOSCOPY;  Surgeon: Lesly Rubenstein, MD;  Location: [facility];  Service: Endoscopy;  Laterality: N/A;   FRACTURE SURGERY Left    arm rod and screw   KNEE ARTHROSCOPY     LAPAROTOMY N/A 01/24/2022   Procedure: EXPLORATORY LAPAROTOMY;  Surgeon: Herbert Pun, MD;  Location: [facility];  Service: General;  Laterality: N/A;   OOPHORECTOMY     RCR     ROTATOR CUFF REPAIR Left 2006     reports that she has never smoked. She has never used smokeless tobacco. She reports that she does not drink alcohol and does not use drugs.  Allergies  Allergen Reactions   Eryc [Erythromycin]    Levofloxacin Nausea Only   Percocet [Oxycodone-Acetaminophen] Nausea And Vomiting   Augmentin [Amoxicillin-Pot Clavulanate] Rash   Celecoxib Palpitations    Family History  Problem Relation Age of Onset   Emphysema Mother    Heart Problems Mother    Tuberculosis  Father    Brain cancer Father    Skin cancer Father    Breast cancer Sister    Irritable bowel syndrome Sister    Lung cancer Brother    Dementia Sister    Schizophrenia Sister     Prior to Admission medications   Medication Sig Start Date End Date Taking? Authorizing Provider  acetaminophen (TYLENOL) 500 MG tablet Take 1,000 mg by mouth daily as needed for pain.    [provider]  aspirin EC 81 MG tablet Take 81 mg by mouth daily.    [provider]  calcium carbonate (CALCIUM ANTACID) 500 MG  chewable tablet Chew 2 tablets by mouth daily.    [provider]  Cholecalciferol (VITAMIN D PO) Take 5,000 Units by mouth daily.    [provider]  Cyanocobalamin (VITAMIN B-12 PO) Take 2 tablets by mouth daily.    [provider]  feeding supplement (ENSURE ENLIVE / ENSURE PLUS) LIQD Take 237 mLs by mouth 3 (three) times daily between meals. 01/30/22   [name], MD  fluticasone (FLONASE) 50 MCG/ACT nasal spray Place 2 sprays into both nostrils daily. 07/26/18   [provider]  furosemide (LASIX) 20 MG tablet Take 20 mg by mouth daily. 10/10/17   [provider]  lidocaine (XYLOCAINE) 2 % solution Use as directed 15 mLs in the mouth or throat every 8 (eight) hours as needed for mouth pain. 01/30/22   [name], MD  loperamide (IMODIUM) 2 MG capsule Take 2 capsules (4 mg total) by mouth every 12 (twelve) hours. 01/30/22   [name], MD  magic mouthwash SOLN Take 5 mLs by mouth 4 (four) times daily. 01/30/22   [name], MD  meloxicam (MOBIC) 7.5 MG tablet Take 1 tablet by mouth daily. 10/15/21   [provider]  metoprolol tartrate (LOPRESSOR) 25 MG tablet Take 1 tablet (25 mg total) by mouth 2 (two) times daily. 01/30/22   [name], MD  montelukast (SINGULAIR) 10 MG tablet Take 10 mg by mouth at bedtime.     [provider]  Multiple Vitamins-Minerals (PRESERVISION AREDS PO) Take 1 capsule by mouth 2 (two) times daily.    [provider]  nystatin (MYCOSTATIN/NYSTOP) powder Apply topically 3 (three) times daily. 01/30/22   [name], MD  omeprazole (PRILOSEC) 20 MG capsule Take 20 mg by mouth 2 (two) times daily before a meal.    [provider]  ondansetron (ZOFRAN) 4 MG tablet Take 1 tablet (4 mg total) by mouth daily as needed for nausea or vomiting. 01/15/22 01/15/23  Enzo Bi, MD  Polyethyl Glycol-Propyl Glycol (SYSTANE) 0.4-0.3 % SOLN Apply 1 drop to eye 2 (two) times daily.    [provider]   SYMBICORT 160-4.5 MCG/ACT inhaler Inhale 2 puffs into the lungs 2 (two) times daily. 06/19/21   [provider]  traMADol (ULTRAM) 50 MG tablet Take 1 tablet (50 mg total) by mouth every 6 (six) hours as needed for severe pain or moderate pain. 01/30/22   [name], MD    Review of Systems:  Review of Systems  Gastrointestinal:  Positive for abdominal pain.  All other systems reviewed and are negative.    ED Course:   > Vitals:   02/17/22 1930 02/17/22 2000 02/17/22 2110 02/17/22 2356  BP: (!) 111/56 (!) 111/59 (!) 126/55 (!) 93/48  Pulse: 97 74  (!) 109  Resp: $Remo'14 20 20 18  'qJJvN$ Temp:  98.4 F (36.9 C) 98.5 F (36.9  C) 99.3 F (37.4 C)  TempSrc:   Oral Oral  SpO2: 99% 100%  99%  Weight: 78.5 kg      > Vitals:   02/17/22 1730 02/17/22 1830 02/17/22 1900 02/17/22 1930  BP: (!) 100/54 (!) 118/59 (!) 113/53 (!) 111/56   02/17/22 2000 02/17/22 2110 02/17/22 2356  BP: (!) 111/59 (!) 126/55 (!) 93/48    >I/O last 3 completed shifts: In: 1000 [IV Piggyback:1000] Out: -  >Total I/O In: 350 [IV Piggyback:350] Out: 850 [Urine:650; Stool:200] >SpO2: 99 %  In the emergency room patient is alert awake oriented afebrile. Initial CMP shows sodium of 129 glucose 138 LFTs otherwise.    EKG: Independently reviewed.  None   Admission Imaging : CT Abdomen Pelvis W Contrast  Result Date: 02/17/2022 CLINICAL DATA:  Sigmoid hemicolectomy 01/20/2022. Repair of cecal perforation. Loop ileostomy creation. Drains removed last week. Abdominal pain. Elevated white blood cell count. EXAM: CT ABDOMEN AND PELVIS WITH CONTRAST TECHNIQUE: Multidetector CT imaging of the abdomen and pelvis was performed using the standard protocol following bolus administration of intravenous contrast. RADIATION DOSE REDUCTION: This exam was performed according to the departmental dose-optimization program which includes automated exposure control, adjustment of the mA and/or kV according to patient size  and/or use of iterative reconstruction technique. CONTRAST:  168mL OMNIPAQUE IOHEXOL 300 MG/ML  SOLN COMPARISON:  02/01/2022 FINDINGS: Lower chest: Motion degradation within the lower chest. Tiny bibasilar pulmonary nodules are most likely incidental/benign and do not warrant imaging follow-up per consensus criteria. Example at maximally 3 mm.Mild cardiomegaly, without pericardial or pleural effusion. Hepatobiliary: Mild motion degradation continuing into the upper abdomen. Normal liver. The gallbladder is underdistended, without calcified stone or specific evidence of acute cholecystitis. Pancreas: Normal, without mass or ductal dilatation. Spleen: Normal in size, without focal abnormality. Adrenals/Urinary Tract: Normal adrenal glands. Normal kidneys, without hydronephrosis. Normal urinary bladder. Stomach/Bowel: Normal stomach, without wall thickening. Interval Hartmann's pouch creation and new complete colectomy. Right lower quadrant ileostomy again identified. Ill-defined right pelvic fluid and gas of 6.0 x 5.1 cm on 57/2 is contiguous with the rectal stump, new since 01/24/2022. ill-defined left pelvic fluid and gas collection measures 6.7 x 4.3 cm on 57/2, also likely tracking from the rectal stump including on 63/2. Extends to the left adnexa and superior aspect of the left side of the vaginal cuff including on 66/2. More ill-defined gas within the ileocolic mesentery including on 50/2. Vascular/Lymphatic: Aortic atherosclerosis. 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 No abdominopelvic adenopathy. Reproductive: Hysterectomy.  No adnexal mass. Other: No free pelvic fluid. Pelvic midline laparotomy included on 66/2. Hyperattenuation material within could represent contrast. Gas just deep to the laparotomy site on 63/2 is intimately associated with the adjacent bowel loops. No well-defined fluid collection. There is also midline abdominal wall presumably laparotomy induced gas on 49/2. Musculoskeletal: No  acute osseous abnormality. Grade 1 L3-4 anterolisthesis with advanced lumbosacral spondylosis. IMPRESSION: 1. Minimally motion degraded exam. 2. Interval near complete colectomy with rectal stump creation. Bilateral ill-defined gas and fluid collections within the pelvis, most consistent with abscesses. These track from the rectal stump site, and anastomotic leak cannot be excluded. 3. Abdominopelvic laparotomy sites. Gas deep to pelvic surgical sutures is intimately associated with adjacent bowel and fistulous communication cannot be excluded (especially given hyperattenuating material within the defect, possibly contrast). 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 Electronically Signed   By: Abigail Miyamoto M.D.   On: 02/17/2022 18:08   DG Chest 2 View  Result  Date: 02/17/2022 CLINICAL DATA:  Suspected sepsis EXAM: CHEST - 2 VIEW COMPARISON:  11/29/2020 FINDINGS: Linear atelectasis in the right upper lobe. No confluent airspace opacities otherwise. Heart is normal size. Mediastinal contours within normal limits. Aortic atherosclerosis. No effusions or acute bony abnormality. IMPRESSION: Platelike right upper lobe atelectasis. No active disease. Electronically Signed   By: Rolm Baptise M.D.   On: 02/17/2022 17:09       Physical Exam: Vitals:   02/17/22 1930 02/17/22 2000 02/17/22 2110 02/17/22 2356  BP: (!) 111/56 (!) 111/59 (!) 126/55 (!) 93/48  Pulse: 97 74  (!) 109  Resp: $Remo'14 20 20 18  'qQDcI$ Temp:  98.4 F (36.9 C) 98.5 F (36.9 C) 99.3 F (37.4 C)  TempSrc:   Oral Oral  SpO2: 99% 100%  99%  Weight: 78.5 kg      Physical Exam Vitals and nursing note reviewed.  Constitutional:      General: She is not in acute distress.    Appearance: Normal appearance. She is not ill-appearing, toxic-appearing or diaphoretic.  HENT:     Head: Normocephalic and atraumatic.     Right Ear: Hearing and external ear normal.     Left Ear: Hearing and external ear normal.     Nose: Nose normal. No nasal deformity.      Mouth/Throat:     Lips: Pink.     Mouth: Mucous membranes are moist.     Tongue: No lesions.     Pharynx: Oropharynx is clear.  Eyes:     Extraocular Movements: Extraocular movements intact.     Pupils: Pupils are equal, round, and reactive to light.  Neck:     Vascular: No carotid bruit.  Cardiovascular:     Rate and Rhythm: Normal rate and regular rhythm.     Pulses: Normal pulses.     Heart sounds: Normal heart sounds.  Pulmonary:     Effort: Pulmonary effort is normal.     Breath sounds: Normal breath sounds.  Abdominal:     General: Bowel sounds are normal. There is distension.     Palpations: Abdomen is soft. There is no mass.     Tenderness: There is abdominal tenderness. There is no guarding.     Hernia: No hernia is present.  Musculoskeletal:     Right lower leg: No edema.     Left lower leg: No edema.  Skin:    General: Skin is warm.  Neurological:     General: No focal deficit present.     Mental Status: She is alert and oriented to person, place, and time.     Cranial Nerves: Cranial nerves 2-12 are intact.     Motor: Motor function is intact.  Psychiatric:        Attention and Perception: Attention normal.        Mood and Affect: Mood normal.        Speech: Speech normal.        Behavior: Behavior normal. Behavior is cooperative.        Cognition and Memory: Cognition normal.    Assessment and Plan: * Abdominal pain CT of the abdomen found collections in the abdomen consistent with abscess. General surgery Dr. Peyton Najjar at bedside and plan for possible IR guided aspiration tomorrow.  1. Minimally motion degraded exam. 2. Interval near complete colectomy with rectal stump creation. Bilateral ill-defined gas and fluid collections within the pelvis, most consistent with abscesses. These track from the rectal stump site, and anastomotic leak  cannot be excluded. 3. Abdominopelvic laparotomy sites. Gas deep to pelvic surgical sutures is intimately associated  with adjacent bowel and fistulous communication cannot be excluded (especially given hyperattenuating material within the defect, possibly contrast). 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  We will continue patient on IV antibiotics.   Sepsis [facility]) Patient met sepsis criteria with infection, white count, tachycardia. Continue with cautious hydration and IV fluids overnight. IV antibiotics with pharmacy protocol. We will follow culture sensitivity n.p.o. after midnight.    Hypothyroidism Will defer to primary care for evaluation of thyroid nodules.   GERD (gastroesophageal reflux disease) IV PPI therapy.   Postprocedural intraabdominal abscess Per general surgery recommendations n.p.o. after midnight with plan for possible IR guided aspiration. Continue with IV antibiotics and follow culture and sensitivity.   Electrolyte abnormality > Electrolyte abnormalities: We will continue with IV fluid hydration for hyponatremia and other electrolyte correction  Anemia  > Anemia with a hemoglobin of 8 MCV of 86 and normal platelet count and a normal RDW. Suspect combination mostly though of blood loss as patient had procedures done last month.  We will transfuse type and screen. Iv ppi. Type and screen.      Unresulted Labs (From admission, onward)     Start     Ordered   02/18/22 0500  Protime-INR  Tomorrow morning,   STAT        02/17/22 1949   02/18/22 0500  Cortisol-am, blood  Tomorrow morning,   URGENT        02/17/22 1949   02/18/22 0500  Procalcitonin  Tomorrow morning,   URGENT        02/17/22 1949   02/18/22 0500  Creatinine, serum  Tomorrow morning,   R        02/17/22 2037   02/17/22 1545  Culture, blood (Routine x 2)  BLOOD CULTURE X 2,   STAT      02/17/22 1544   02/17/22 1545  Urinalysis, Routine w reflex microscopic  Once,   URGENT        02/17/22 1544             DVT prophylaxis:  scd   Code Status:  Full code    Family Communication:   Hilmes,Herbert L (Spouse)  [phone] (Mobile)   Disposition Plan:  Home    Consults called:  General surgery dr. Peyton Najjar.  Admission status: Inpatient.    Unit: Med tele.   Para Skeans MD [facility]  6 PM- 2 AM. Please contact me via secure Chat 6 PM-2 AM. [phone] ( Pager ) To contact the [facility] Attending or Consulting provider [place] or covering provider during after hours [place], for this patient.   Check the care team in [facility] and look for a) attending/consulting [facility] provider listed and b) the [facility] team listed Log into [email or web address] and use [place]'s universal password to access. If you do not have the password, please contact the hospital operator. Locate the [facility] provider you are looking for under [facility] and page to a number that you can be directly reached. If you still have difficulty reaching the provider, please page the [facility] (Director on Call) for the Hospitalists listed on amion for assistance. [email or web address] 02/18/2022, 1:15 AM

## 2022-02-17 NOTE — Consult Note (Signed)
Pharmacy Antibiotic Note  Lindsey Thomas is a 81 y.o. female admitted on 02/17/2022 with  wound infection . PMH significant for NSCLC, HLD, lymphedema, sleep apnea, COPD, recent hospitalization and surgery for sigmoid colon stricture and SBO. Recent surgery complicated by perforation s/p sigmoid colectomy with ileostomy and abscess. Of note, previous wound cultures from 01/24/2022 grew pan-sensitive pseudomonas. Patient presented with purulent drainage from wound site and elevated WBC. Pharmacy has been consulted for Vancomycin and Cefepime dosing.  Plan: Day 1 of antibiotics Give Vancomycin 1750 mg IV x1 followed by 1000 mg IV Q36H. Goal AUC 400-550. Expected AUC: 541.8 Expected Css min: 12.6 SCr used: 0.8 (actual 0.52) Weight used: IBW, Vd used: 0.5 (BMI 37.4) Initiate Cefepime 2g Q12H Continue to monitor renal function and culture results  Weight: 78.5 kg (173 lb 1 oz) (per 02/10/22 [facility] note)  No data recorded.  Recent Labs  Lab 02/17/22 1630  WBC 15.0*  CREATININE 0.52  LATICACIDVEN 0.9    Estimated Creatinine Clearance: 45.9 mL/min (by C-G formula based on SCr of 0.52 mg/dL).    Allergies  Allergen Reactions   Eryc [Erythromycin]    Levofloxacin Nausea Only   Percocet [Oxycodone-Acetaminophen] Nausea And Vomiting   Augmentin [Amoxicillin-Pot Clavulanate] Rash   Celecoxib Palpitations    Antimicrobials this admission: 9/13 Vancomycin >>  9/13 Cefepime >>   Dose adjustments this admission: N/A  Microbiology results: 9/13 BCx: in process  Thank you for allowing pharmacy to be a part of this patient's care.  Gretel Acre, PharmD PGY1 Pharmacy Resident 02/17/2022 8:03 PM

## 2022-02-17 NOTE — ED Triage Notes (Signed)
Open abd surgery recently with 2 GP drains and an ostomy. Drains removed last week. Green drainage from drain site, abd incision site, and ostomy bag. Denies fevers. Per facility WBC has been increasing.

## 2022-02-17 NOTE — Consult Note (Signed)
SURGICAL CONSULTATION NOTE   HISTORY OF PRESENT ILLNESS (HPI):  81 y.o. female presented to [facility] ED for evaluation of abdominal pain. Patient reports she was started to get better during the last week but this morning she was feeling more intermittent pain during physical therapy.  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  Then patient had subtotal colectomy with end ileostomy creation.  The patient was able to recover slowly and was discharged to skilled nursing facility.  Patient has been improving doing physical therapy and eating better but she has been having persistent drainage from the left lower wound.  Now she is also having some drainage from the lower portion of the midline wound.  At the ED she was found with 15,000 white blood cell count.  No fever.  Normal lactic acid.  Patient hyponatremic with adequate renal function.  No other significant electrolyte disturbance.  CT scan of the abdomen and pelvis shows 2 new fluid collection in the pelvis.  This may be concerning of abscess.  I personally evaluated the images.  No free air or free fluid.  Surgery is consulted by Dr. Jacelyn Grip in this context for evaluation and management of pelvic abscess.  PAST MEDICAL HISTORY (PMH):  Past Medical History:  Diagnosis Date   Anxiety    Arthritis    Asthma    DVT (deep venous thrombosis) (HCC)    Edema    feet   Emphysema of lung (HCC)    GERD (gastroesophageal reflux disease)    Headache    MIGRAINES   History of orthopnea    Hypercholesterolemia    Hypothyroidism    NODULES   Lymphedema    Migraine    Non-small cell lung cancer, right ([place]) 05/2019   Rad tx's   Shortness of breath dyspnea    WITH EXERTION   Sleep apnea    MILD, DOES NOT USE CPAP   Thyroid nodule      PAST  SURGICAL HISTORY ([place]):  Past Surgical History:  Procedure Laterality Date   ABDOMINAL HYSTERECTOMY     BREAST BIOPSY     CARDIAC CATHETERIZATION     CATARACT EXTRACTION W/PHACO Left 02/12/2016   Procedure: CATARACT EXTRACTION PHACO AND INTRAOCULAR LENS PLACEMENT ([place]);  Surgeon: Eulogio Bear, MD;  Location: [facility] ORS;  Service: Ophthalmology;  Laterality: Left;  [place] 01:30 AP% 15.2 CDE 13.71 Fluid pack lot # [id number]   CATARACT EXTRACTION W/PHACO Right 03/18/2016   Procedure: CATARACT EXTRACTION PHACO AND INTRAOCULAR LENS PLACEMENT ([facility]);  Surgeon: Eulogio Bear, MD;  Location: [facility] ORS;  Service: Ophthalmology;  Laterality: Right;  Lot # [id number] H [place]: 01:05.5 AP%:14.3 CDE: 9.33   COLECTOMY WITH COLOSTOMY CREATION/HARTMANN PROCEDURE N/A 01/20/2022   Procedure: COLECTOMY WITH COLOSTOMY CREATION/HARTMANN PROCEDURE;  Surgeon: Herbert Pun, MD;  Location: [facility] ORS;  Service: General;  Laterality: N/A;   FLEXIBLE SIGMOIDOSCOPY N/A 01/14/2022   Procedure: FLEXIBLE SIGMOIDOSCOPY;  Surgeon: Toledo, Benay Pike, MD;  Location: [facility] ENDOSCOPY;  Service: Gastroenterology;  Laterality: N/A;   FLEXIBLE SIGMOIDOSCOPY N/A 01/19/2022   Procedure: FLEXIBLE SIGMOIDOSCOPY;  Surgeon: Lesly Rubenstein, MD;  Location: [facility] ENDOSCOPY;  Service: Endoscopy;  Laterality: N/A;   FRACTURE SURGERY Left    arm rod and screw   KNEE ARTHROSCOPY     LAPAROTOMY N/A 01/24/2022   Procedure: EXPLORATORY LAPAROTOMY;  Surgeon:  Herbert Pun, MD;  Location: [facility] ORS;  Service: General;  Laterality: N/A;   OOPHORECTOMY     RCR     ROTATOR CUFF REPAIR Left 2006     MEDICATIONS:  Prior to Admission medications   Medication Sig Start Date End Date Taking? Authorizing Provider  acetaminophen (TYLENOL) 500 MG tablet Take 1,000 mg by mouth daily as needed for pain.    [provider]  aspirin EC 81 MG tablet Take 81 mg by mouth daily.    [provider]  calcium carbonate (CALCIUM ANTACID)  500 MG chewable tablet Chew 2 tablets by mouth daily.    [provider]  Cholecalciferol (VITAMIN D PO) Take 5,000 Units by mouth daily.    [provider]  Cyanocobalamin (VITAMIN B-12 PO) Take 2 tablets by mouth daily.    [provider]  feeding supplement (ENSURE ENLIVE / ENSURE PLUS) LIQD Take 237 mLs by mouth 3 (three) times daily between meals. 01/30/22   [name], MD  fluticasone (FLONASE) 50 MCG/ACT nasal spray Place 2 sprays into both nostrils daily. 07/26/18   [provider]  furosemide (LASIX) 20 MG tablet Take 20 mg by mouth daily. 10/10/17   [provider]  lidocaine (XYLOCAINE) 2 % solution Use as directed 15 mLs in the mouth or throat every 8 (eight) hours as needed for mouth pain. 01/30/22   [name], MD  loperamide (IMODIUM) 2 MG capsule Take 2 capsules (4 mg total) by mouth every 12 (twelve) hours. 01/30/22   [name], MD  magic mouthwash SOLN Take 5 mLs by mouth 4 (four) times daily. 01/30/22   [name], MD  meloxicam (MOBIC) 7.5 MG tablet Take 1 tablet by mouth daily. 10/15/21   [provider]  metoprolol tartrate (LOPRESSOR) 25 MG tablet Take 1 tablet (25 mg total) by mouth 2 (two) times daily. 01/30/22   [name], MD  montelukast (SINGULAIR) 10 MG tablet Take 10 mg by mouth at bedtime.     [provider]  Multiple Vitamins-Minerals (PRESERVISION AREDS PO) Take 1 capsule by mouth 2 (two) times daily.    [provider]  nystatin (MYCOSTATIN/NYSTOP) powder Apply topically 3 (three) times daily. 01/30/22   [name], MD  omeprazole (PRILOSEC) 20 MG capsule Take 20 mg by mouth 2 (two) times daily before a meal.    [provider]  ondansetron (ZOFRAN) 4 MG tablet Take 1 tablet (4 mg total) by mouth daily as needed for nausea or vomiting. 01/15/22 01/15/23  Enzo Bi, MD  Polyethyl Glycol-Propyl Glycol (SYSTANE) 0.4-0.3 % SOLN Apply 1 drop to eye 2 (two) times daily.    [provider]  SYMBICORT 160-4.5 MCG/ACT inhaler Inhale 2 puffs into the lungs 2 (two) times daily. 06/19/21   [provider]  traMADol (ULTRAM) 50 MG tablet Take 1 tablet (50 mg total) by mouth every 6 (six) hours as needed for severe pain or moderate pain. 01/30/22   [name], MD     ALLERGIES:  Allergies  Allergen Reactions   Eryc [Erythromycin]    Levofloxacin Nausea Only   Percocet [Oxycodone-Acetaminophen] Nausea And Vomiting   Augmentin [Amoxicillin-Pot Clavulanate] Rash   Celecoxib Palpitations     SOCIAL HISTORY:  Social History   Socioeconomic History   Marital status: Married    Spouse name: Not on file   Number of children: Not on file   Years of education: Not on file   Highest education level: Not on file  Occupational History   Not on file  Tobacco Use   Smoking status: Never   Smokeless tobacco: Never  Substance and Sexual Activity   Alcohol use: No   Drug use: Never   Sexual activity: Not on file  Other Topics Concern   Not on file  Social History Narrative   Not on file   Social Determinants of Health   Financial Resource Strain: Not on file  Food Insecurity: Not on file  Transportation Needs: Not on file  Physical Activity: Not on file  Stress: Not on file  Social Connections: Not on file  Intimate Partner Violence: Not on file      FAMILY HISTORY:  Family History  Problem Relation Age of Onset   Emphysema Mother    Heart Problems Mother    Tuberculosis Father    Brain cancer Father    Skin cancer Father    Breast cancer Sister    Irritable bowel syndrome Sister    Lung cancer Brother    Dementia Sister    Schizophrenia Sister      VITAL SIGNS:  Temp:  [98.4 F (36.9 C)] 98.4 F (36.9 C) (09/13 2000) Pulse Rate:  [74-97] 74 (09/13 2000) Resp:  [14-20] 20 (09/13 2000) BP: (100-118)/(53-59) 111/59 (09/13 2000) SpO2:  [94 %-100 %] 100 % (09/13 2000) Weight:  [78.5 kg] 78.5 kg (09/13 1930)       Weight: 78.5 kg (per 02/10/22 [facility]  note)     INTAKE/OUTPUT:  This shift: No intake/output data recorded.  Last 2 shifts: @IOLAST2SHIFTS @   PHYSICAL EXAM:  Constitutional:  -- Normal body habitus  -- Awake, alert, and oriented x3  Eyes:  -- Pupils equally round and reactive to light  -- No scleral icterus  Ear, nose, and throat:  -- No jugular venous distension  Pulmonary:  -- No crackles  -- Equal breath sounds bilaterally -- Breathing non-labored at rest Cardiovascular:  -- S1, S2 present  -- No pericardial rubs Gastrointestinal:  -- Abdomen soft, nontender, non-distended, no guarding or rebound tenderness -- Complex midline wound with opening on the lower portion.  There seems to be purulent drainage. --The ileostomy has partial a sensitive skin but no sign of deeper infection. Musculoskeletal and Integumentary:  -- Wounds: There is a 3 cm stage 2 sacral pressure ulcer.  This is new and was present before this admission today. -- Extremities: B/L UE and LE FROM, hands and feet warm, no edema  Neurologic:  -- Motor function: intact and symmetric -- Sensation: intact and symmetric   Labs:     Latest Ref Rng & Units 02/17/2022    4:30 PM 01/25/2022    5:45 AM 01/25/2022   12:50 AM  CBC  WBC 4.0 - 10.5 K/uL 15.0  15.9  16.7   Hemoglobin 12.0 - 15.0 g/dL 8.0  10.3  10.6   Hematocrit 36.0 - 46.0 % 24.8  32.4  34.8   Platelets 150 - 400 K/uL 279  199  216       Latest Ref Rng & Units 02/17/2022    4:30 PM 01/30/2022    4:48 AM 01/29/2022    2:12 PM  CMP  Glucose 70 - 99 mg/dL 138  111    BUN 8 - 23 mg/dL 9  14    Creatinine 0.44 - 1.00 mg/dL 0.52  0.38    Sodium 135 - 145 mmol/L 129  135    Potassium 3.5 - 5.1 mmol/L 3.5  3.8  3.7   Chloride 98 - 111 mmol/L 99  106    CO2 22 - 32 mmol/L 20  26    Calcium 8.9 - 10.3 mg/dL 7.8  7.6    Total Protein 6.5 - 8.1 g/dL 5.4     Total Bilirubin 0.3 - 1.2 mg/dL 0.3     Alkaline Phos 38 - 126 U/L 77     AST 15 - 41 U/L 12     ALT 0 - 44 U/L 9       Imaging  studies:  EXAM: CT ABDOMEN AND PELVIS WITH CONTRAST   TECHNIQUE: Multidetector CT imaging of the abdomen and pelvis was performed using the standard protocol following bolus administration of intravenous contrast.   RADIATION DOSE REDUCTION: This exam was performed according to the departmental dose-optimization program which includes automated exposure control, adjustment of the mA and/or kV according to patient size and/or use of iterative reconstruction technique.   CONTRAST:  134mL OMNIPAQUE IOHEXOL 300 MG/ML  SOLN   COMPARISON:  02/01/2022   FINDINGS: Lower chest: Motion degradation within the lower chest. Tiny bibasilar pulmonary nodules are most likely incidental/benign and do not warrant imaging follow-up per consensus criteria. Example at maximally 3 mm.Mild cardiomegaly, without pericardial or pleural effusion.   Hepatobiliary: Mild motion degradation continuing into the upper abdomen. Normal liver. The gallbladder is underdistended, without calcified stone or specific evidence of acute cholecystitis.   Pancreas: Normal, without mass or ductal dilatation.   Spleen: Normal in size, without focal abnormality.   Adrenals/Urinary Tract: Normal adrenal glands. Normal kidneys, without hydronephrosis. Normal urinary bladder.   Stomach/Bowel: Normal stomach, without wall thickening. Interval Hartmann's pouch creation and new complete colectomy. Right lower quadrant ileostomy again identified.   Ill-defined right pelvic fluid and gas of 6.0 x 5.1 cm on 57/2 is contiguous with the rectal stump, new since 01/24/2022.   ill-defined left pelvic fluid and gas collection measures 6.7 x 4.3 cm on 57/2, also likely tracking from the rectal stump including on 63/2. Extends to the left adnexa and superior aspect of the left side of the vaginal cuff including on 66/2.   More ill-defined gas within the ileocolic mesentery including on 50/2.   Vascular/Lymphatic: Aortic  atherosclerosis. 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 No abdominopelvic adenopathy.   Reproductive: Hysterectomy.  No adnexal mass.   Other: No free pelvic fluid. Pelvic midline laparotomy included on 66/2. Hyperattenuation material within could represent contrast. Gas just deep to the laparotomy site on 63/2 is intimately associated with the adjacent bowel loops. No well-defined fluid collection. There is also midline abdominal wall presumably laparotomy induced gas on 49/2.   Musculoskeletal: No acute osseous abnormality. Grade 1 L3-4 anterolisthesis with advanced lumbosacral spondylosis.   IMPRESSION: 1. Minimally motion degraded exam. 2. Interval near complete colectomy with rectal stump creation. Bilateral ill-defined gas and fluid collections within the pelvis, most consistent with abscesses. These track from the rectal stump site, and anastomotic leak cannot be excluded. 3. Abdominopelvic laparotomy sites. Gas deep to pelvic surgical sutures is intimately associated with adjacent bowel and fistulous communication cannot be excluded (especially given hyperattenuating material within the defect, possibly contrast). 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     Electronically Signed   By: Abigail Miyamoto M.D.   On: 02/17/2022 18:08  Assessment/Plan:  81 y.o. female with sepsis due to pelvic abscess. -Patient started on broad-spectrum antibiotic therapy -Patient started on adequate hydration -Patient to have regular diet today but n.p.o. after midnight -  I will discuss with IR to see if these pelvic collections and amenable for percutaneous drainage  Hyponatremia -History resolved with adequate IV hydration  I personally the wound care to the midline, left lower quadrant small wound and ileostomy. -Wound care service consult in the morning for further recommendations  Sacral ulcer -Stage II sacral ulcer.  This is new and was present to a before she was admitted.  No need of  debridement.  Recommended frequent position changing   Arnold Long, MD

## 2022-02-17 NOTE — Consult Note (Signed)
PHARMACY -  BRIEF ANTIBIOTIC NOTE   Pharmacy has received consult(s) for Vancomycin from an ED provider.  The patient's profile has been reviewed for ht/wt/allergies/indication/available labs.    One time order(s) placed for Vancomycin 1750 mg IV x1  Further antibiotics/pharmacy consults should be ordered by admitting physician if indicated.                       Thank you,  Gretel Acre, PharmD PGY1 Pharmacy Resident 02/17/2022 4:14 PM

## 2022-02-17 NOTE — ED Provider Notes (Incomplete)
[facility] Provider Note    Event Date/Time   First MD Initiated Contact with Patient 02/17/22 1543     (approximate)   History   Abdominal Pain   HPI  Lindsey Thomas is a 81 y.o. female   Past medical history of non-small cell lung cancer, hyperlipidemia, lymphedema, sleep apnea, COPD, recent hospitalization and surgery for sigmoid colon stricture small bowel obstruction complicated by perforation status post sigmoid colectomy with ileostomy, course was complicated by abscess with subsequent drainage and colectomy presents with purulent drainage from wound site after drains were recently removed.  Her facility reports increasing white blood cell count.  No fever.  History was obtained via patient and review of external medical notes including discharge summary from 01/30/2022      Physical Exam   Triage Vital Signs: ED Triage Vitals [02/17/22 1542]  Enc Vitals Group     BP      Pulse      Resp      Temp      Temp src      SpO2      Weight      Height      Head Circumference      Peak Flow      Pain Score 0     Pain Loc      Pain Edu?      Excl. in [place]?     Most recent vital signs: Vitals:   02/17/22 2110 02/17/22 2356  BP: (!) 126/55 (!) 93/48  Pulse:  (!) 109  Resp: 20 18  Temp: 98.5 F (36.9 C) 99.3 F (37.4 C)  SpO2:  99%    General: Awake, no distress.  CV:  Good peripheral perfusion.  Resp:  Normal effort.  Abd:  Colostomy bag in place, purulent drainage from to abdominal surgical sites on the left side of the abdomen, no crepitus, mild tenderness to palpation surrounding.  No cellulitic changes.    ED Results / Procedures / Treatments   Labs (all labs ordered are listed, but only abnormal results are displayed) Labs Reviewed  COMPREHENSIVE METABOLIC PANEL - Abnormal; Notable for the following components:      Result Value   Sodium 129 (*)    CO2 20 (*)    Glucose, Bld 138 (*)    Calcium 7.8 (*)    Total  Protein 5.4 (*)    Albumin 2.0 (*)    AST 12 (*)    All other components within normal limits  CBC WITH DIFFERENTIAL/PLATELET - Abnormal; Notable for the following components:   WBC 15.0 (*)    RBC 2.86 (*)    Hemoglobin 8.0 (*)    HCT 24.8 (*)    Neutro Abs 12.6 (*)    Abs Immature Granulocytes 0.43 (*)    All other components within normal limits  PROTIME-INR - Abnormal; Notable for the following components:   Prothrombin Time 15.6 (*)    INR 1.3 (*)    All other components within normal limits  TSH - Abnormal; Notable for the following components:   TSH 5.358 (*)    All other components within normal limits  T4, FREE - Abnormal; Notable for the following components:   Free T4 1.46 (*)    All other components within normal limits  CULTURE, BLOOD (ROUTINE X 2)  CULTURE, BLOOD (ROUTINE X 2)  LACTIC ACID, PLASMA  LACTIC ACID, PLASMA  URINALYSIS, ROUTINE W REFLEX MICROSCOPIC  PROTIME-INR  CORTISOL-AM,  BLOOD  PROCALCITONIN  CREATININE, SERUM     I reviewed labs and they are notable for blood cell count of 15.0.    RADIOLOGY I independently reviewed and interpreted CT scan of the abdomen pelvis and see some fluid collections in the left pelvis.   PROCEDURES:  Critical Care performed: Yes, see critical care procedure note(s)  .Critical Care  Performed by: Lucillie Garfinkel, MD Authorized by: Lucillie Garfinkel, MD   Critical care provider statement:    Critical care time (minutes):  30   Critical care was necessary to treat or prevent imminent or life-threatening deterioration of the following conditions:  Sepsis   Critical care was time spent personally by me on the following activities:  Development of treatment plan with patient or surrogate, discussions with consultants, ordering and review of radiographic studies, ordering and review of laboratory studies, pulse oximetry, re-evaluation of patient's condition, examination of patient and evaluation of patient's response to  treatment   Care discussed with: admitting provider      MEDICATIONS ORDERED IN ED: Medications  montelukast (SINGULAIR) tablet 10 mg (10 mg Oral Given 02/17/22 2335)  fluticasone (FLONASE) 50 MCG/ACT nasal spray 2 spray (has no administration in time range)  polyvinyl alcohol (LIQUIFILM TEARS) 1.4 % ophthalmic solution 1 drop (has no administration in time range)  ondansetron (ZOFRAN) tablet 4 mg (has no administration in time range)  metoprolol tartrate (LOPRESSOR) tablet 25 mg (25 mg Oral Given 02/17/22 2334)  lidocaine (XYLOCAINE) 2 % viscous mouth solution 15 mL (has no administration in time range)  lactated ringers infusion ( Intravenous New Bag/Given 02/17/22 2344)  aspirin EC tablet 81 mg (has no administration in time range)  pantoprazole (PROTONIX) injection 40 mg (40 mg Intravenous Given 02/17/22 2335)  vancomycin (VANCOCIN) IVPB 1000 mg/200 mL premix (has no administration in time range)  ceFEPIme (MAXIPIME) 2 g in sodium chloride 0.9 % 100 mL IVPB (has no administration in time range)  lactated ringers bolus 500 mL (0 mLs Intravenous Stopped 02/17/22 1702)  lactated ringers bolus 1,000 mL (0 mLs Intravenous Stopped 02/17/22 1900)  ceFEPIme (MAXIPIME) 2 g in sodium chloride 0.9 % 100 mL IVPB (0 g Intravenous Stopped 02/17/22 1716)  vancomycin (VANCOREADY) IVPB 1750 mg/350 mL (0 mg Intravenous Stopped 02/17/22 2019)  iohexol (OMNIPAQUE) 300 MG/ML solution 100 mL (100 mLs Intravenous Contrast Given 02/17/22 1741)    Consultants:  I spoke with general surgery consultant and hospitalist regarding care plan for this patient.   IMPRESSION / MDM / ASSESSMENT AND PLAN / ED COURSE  I reviewed the triage vital signs and the nursing notes.                              Differential diagnosis includes, but is not limited to, operative infection given purulence from drainage sites, obtain CT scan with abdomen pelvis along with labs, screening for sepsis, blood cultures, careful IV fluid  hydration given history of fluid retention on home diuretics.   The patient is on the cardiac monitor to evaluate for evidence of arrhythmia and/or significant heart rate changes.  MDM: Patient found to have 2 fluid collections in the abdomen/pelvis consistent with abscesses on CT scan.  She had already received broad-spectrum antibiotics and fluid resuscitation.  She remained stable while in the emergency department.  I consulted with general surgery Dr. Peyton Najjar  Dispo: After careful consideration of this patient's presentation, medical and social risk factors, and evaluation in the  emergency department I engaged in shared decision making with the patient and/or their representative to consider admission or observation and this patient was ultimately *** because ***.   Patient's presentation is most consistent with {EM COPA:27473}       FINAL CLINICAL IMPRESSION(S) / ED DIAGNOSES   Final diagnoses:  Abscess  Generalized abdominal pain     Rx / DC Orders   ED Discharge Orders     None        Note:  This document was prepared using Dragon voice recognition software and may include unintentional dictation errors.

## 2022-02-17 NOTE — ED Provider Notes (Signed)
[facility] Provider Note    Event Date/Time   First MD Initiated Contact with Patient 02/17/22 1543     (approximate)   History   Abdominal Pain   HPI  Lindsey Thomas is a 81 y.o. female   Past medical history of non-small cell lung cancer, hyperlipidemia, lymphedema, sleep apnea, COPD, recent hospitalization and surgery for sigmoid colon stricture small bowel obstruction complicated by perforation status post sigmoid colectomy with ileostomy, course was complicated by abscess with subsequent drainage and colectomy presents with purulent drainage from wound site after drains were recently removed.  Her facility reports increasing white blood cell count.  No fever.  History was obtained via patient and review of external medical notes including discharge summary from 01/30/2022      Physical Exam   Triage Vital Signs: ED Triage Vitals [02/17/22 1542]  Enc Vitals Group     BP      Pulse      Resp      Temp      Temp src      SpO2      Weight      Height      Head Circumference      Peak Flow      Pain Score 0     Pain Loc      Pain Edu?      Excl. in [place]?     Most recent vital signs: Vitals:   02/17/22 2110 02/17/22 2356  BP: (!) 126/55 (!) 93/48  Pulse:  (!) 109  Resp: 20 18  Temp: 98.5 F (36.9 C) 99.3 F (37.4 C)  SpO2:  99%    General: Awake, no distress.  CV:  Good peripheral perfusion.  Resp:  Normal effort.  Abd:  Colostomy bag in place, purulent drainage from to abdominal surgical sites on the left side of the abdomen, no crepitus, mild tenderness to palpation surrounding.  No cellulitic changes.    ED Results / Procedures / Treatments   Labs (all labs ordered are listed, but only abnormal results are displayed) Labs Reviewed  COMPREHENSIVE METABOLIC PANEL - Abnormal; Notable for the following components:      Result Value   Sodium 129 (*)    CO2 20 (*)    Glucose, Bld 138 (*)    Calcium 7.8 (*)    Total  Protein 5.4 (*)    Albumin 2.0 (*)    AST 12 (*)    All other components within normal limits  CBC WITH DIFFERENTIAL/PLATELET - Abnormal; Notable for the following components:   WBC 15.0 (*)    RBC 2.86 (*)    Hemoglobin 8.0 (*)    HCT 24.8 (*)    Neutro Abs 12.6 (*)    Abs Immature Granulocytes 0.43 (*)    All other components within normal limits  PROTIME-INR - Abnormal; Notable for the following components:   Prothrombin Time 15.6 (*)    INR 1.3 (*)    All other components within normal limits  TSH - Abnormal; Notable for the following components:   TSH 5.358 (*)    All other components within normal limits  T4, FREE - Abnormal; Notable for the following components:   Free T4 1.46 (*)    All other components within normal limits  CULTURE, BLOOD (ROUTINE X 2)  CULTURE, BLOOD (ROUTINE X 2)  LACTIC ACID, PLASMA  LACTIC ACID, PLASMA  URINALYSIS, ROUTINE W REFLEX MICROSCOPIC  PROTIME-INR  CORTISOL-AM,  BLOOD  PROCALCITONIN  CREATININE, SERUM     I reviewed labs and they are notable for blood cell count of 15.0.    RADIOLOGY I independently reviewed and interpreted CT scan of the abdomen pelvis and see some fluid collections in the left pelvis.   PROCEDURES:  Critical Care performed: Yes, see critical care procedure note(s)  .Critical Care  Performed by: Lucillie Garfinkel, MD Authorized by: Lucillie Garfinkel, MD   Critical care provider statement:    Critical care time (minutes):  30   Critical care was necessary to treat or prevent imminent or life-threatening deterioration of the following conditions:  Sepsis   Critical care was time spent personally by me on the following activities:  Development of treatment plan with patient or surrogate, discussions with consultants, ordering and review of radiographic studies, ordering and review of laboratory studies, pulse oximetry, re-evaluation of patient's condition, examination of patient and evaluation of patient's response to  treatment   Care discussed with: admitting provider      MEDICATIONS ORDERED IN ED: Medications  montelukast (SINGULAIR) tablet 10 mg (10 mg Oral Given 02/17/22 2335)  fluticasone (FLONASE) 50 MCG/ACT nasal spray 2 spray (has no administration in time range)  polyvinyl alcohol (LIQUIFILM TEARS) 1.4 % ophthalmic solution 1 drop (has no administration in time range)  ondansetron (ZOFRAN) tablet 4 mg (has no administration in time range)  metoprolol tartrate (LOPRESSOR) tablet 25 mg (25 mg Oral Given 02/17/22 2334)  lidocaine (XYLOCAINE) 2 % viscous mouth solution 15 mL (has no administration in time range)  lactated ringers infusion ( Intravenous New Bag/Given 02/17/22 2344)  aspirin EC tablet 81 mg (has no administration in time range)  pantoprazole (PROTONIX) injection 40 mg (40 mg Intravenous Given 02/17/22 2335)  vancomycin (VANCOCIN) IVPB 1000 mg/200 mL premix (has no administration in time range)  ceFEPIme (MAXIPIME) 2 g in sodium chloride 0.9 % 100 mL IVPB (has no administration in time range)  lactated ringers bolus 500 mL (0 mLs Intravenous Stopped 02/17/22 1702)  lactated ringers bolus 1,000 mL (0 mLs Intravenous Stopped 02/17/22 1900)  ceFEPIme (MAXIPIME) 2 g in sodium chloride 0.9 % 100 mL IVPB (0 g Intravenous Stopped 02/17/22 1716)  vancomycin (VANCOREADY) IVPB 1750 mg/350 mL (0 mg Intravenous Stopped 02/17/22 2019)  iohexol (OMNIPAQUE) 300 MG/ML solution 100 mL (100 mLs Intravenous Contrast Given 02/17/22 1741)    Consultants:  I spoke with general surgery consultant and hospitalist regarding care plan for this patient.   IMPRESSION / MDM / ASSESSMENT AND PLAN / ED COURSE  I reviewed the triage vital signs and the nursing notes.                              Differential diagnosis includes, but is not limited to, operative infection given purulence from drainage sites, obtain CT scan with abdomen pelvis along with labs, screening for sepsis, blood cultures, careful IV fluid  hydration given history of fluid retention on home diuretics.   The patient is on the cardiac monitor to evaluate for evidence of arrhythmia and/or significant heart rate changes.  MDM: Patient found to have 2 fluid collections in the abdomen/pelvis consistent with abscesses on CT scan.  She had already received broad-spectrum antibiotics and fluid resuscitation.  She remained stable while in the emergency department.  I consulted with general surgery Dr. Peyton Najjar, advised IV antibiotics and admission to hospitalist service and he will consult IR for potential drainage.  Patient's  presentation is most consistent with acute presentation with potential threat to life or bodily function.       FINAL CLINICAL IMPRESSION(S) / ED DIAGNOSES   Final diagnoses:  Abscess  Generalized abdominal pain     Rx / DC Orders   ED Discharge Orders     None        Note:  This document was prepared using Dragon voice recognition software and may include unintentional dictation errors.    Lucillie Garfinkel, MD 02/18/22 [phone]

## 2022-02-18 ENCOUNTER — Inpatient Hospital Stay: Payer: Medicare HMO

## 2022-02-18 ENCOUNTER — Encounter: Payer: Self-pay | Admitting: Internal Medicine

## 2022-02-18 ENCOUNTER — Ambulatory Visit: Admission: RE | Admit: 2022-02-18 | Payer: Medicare HMO | Source: Ambulatory Visit

## 2022-02-18 DIAGNOSIS — R946 Abnormal results of thyroid function studies: Secondary | ICD-10-CM | POA: Diagnosis not present

## 2022-02-18 DIAGNOSIS — E669 Obesity, unspecified: Secondary | ICD-10-CM

## 2022-02-18 DIAGNOSIS — D649 Anemia, unspecified: Secondary | ICD-10-CM | POA: Diagnosis present

## 2022-02-18 DIAGNOSIS — A419 Sepsis, unspecified organism: Secondary | ICD-10-CM | POA: Diagnosis not present

## 2022-02-18 DIAGNOSIS — E871 Hypo-osmolality and hyponatremia: Secondary | ICD-10-CM | POA: Diagnosis not present

## 2022-02-18 DIAGNOSIS — L8990 Pressure ulcer of unspecified site, unspecified stage: Secondary | ICD-10-CM | POA: Diagnosis present

## 2022-02-18 LAB — CBC
HCT: 24 % — ABNORMAL LOW (ref 36.0–46.0)
Hemoglobin: 7.7 g/dL — ABNORMAL LOW (ref 12.0–15.0)
MCH: 27.9 pg (ref 26.0–34.0)
MCHC: 32.1 g/dL (ref 30.0–36.0)
MCV: 87 fL (ref 80.0–100.0)
Platelets: 273 10*3/uL (ref 150–400)
RBC: 2.76 MIL/uL — ABNORMAL LOW (ref 3.87–5.11)
RDW: 14.7 % (ref 11.5–15.5)
WBC: 12.4 10*3/uL — ABNORMAL HIGH (ref 4.0–10.5)
nRBC: 0 % (ref 0.0–0.2)

## 2022-02-18 LAB — BASIC METABOLIC PANEL
Anion gap: 10 (ref 5–15)
BUN: 8 mg/dL (ref 8–23)
CO2: 23 mmol/L (ref 22–32)
Calcium: 7.9 mg/dL — ABNORMAL LOW (ref 8.9–10.3)
Chloride: 101 mmol/L (ref 98–111)
Creatinine, Ser: 0.49 mg/dL (ref 0.44–1.00)
GFR, Estimated: >60 mL/min (ref >60)
Glucose, Bld: 91 mg/dL (ref 70–99)
Potassium: 3.3 mmol/L — ABNORMAL LOW (ref 3.5–5.1)
Sodium: 134 mmol/L — ABNORMAL LOW (ref 135–145)

## 2022-02-18 LAB — PROTIME-INR
INR: 1.2 (ref 0.8–1.2)
Prothrombin Time: 15.3 seconds — ABNORMAL HIGH (ref 11.4–15.2)

## 2022-02-18 LAB — CREATININE, SERUM
Creatinine, Ser: 0.51 mg/dL (ref 0.44–1.00)
GFR, Estimated: >60 mL/min (ref >60)

## 2022-02-18 LAB — CORTISOL-AM, BLOOD: Cortisol - AM: 14.3 ug/dL (ref 6.7–22.6)

## 2022-02-18 LAB — PROCALCITONIN: Procalcitonin: 0.49 ng/mL

## 2022-02-18 MED ORDER — FENTANYL CITRATE (PF) 100 MCG/2ML IJ SOLN
INTRAMUSCULAR | Status: AC
  Filled 2022-02-18: qty 2

## 2022-02-18 MED ORDER — SODIUM CHLORIDE 0.9 % IV SOLN: INTRAVENOUS | Status: DC

## 2022-02-18 MED ORDER — FENTANYL CITRATE (PF) 100 MCG/2ML IJ SOLN
INTRAMUSCULAR | Status: DC | PRN
  Administered 2022-02-18 (×3): 25 ug via INTRAVENOUS

## 2022-02-18 MED ORDER — MIDAZOLAM HCL 2 MG/2ML IJ SOLN
INTRAMUSCULAR | Status: AC
  Filled 2022-02-18: qty 2

## 2022-02-18 MED ORDER — ORAL CARE MOUTH RINSE: 15.0000 mL | OROMUCOSAL | Status: DC | PRN

## 2022-02-18 MED ORDER — METRONIDAZOLE 500 MG/100ML IV SOLN
500.0000 mg | Freq: Two times a day (BID) | INTRAVENOUS | Status: DC
  Administered 2022-02-18 – 2022-02-20 (×4): 500 mg via INTRAVENOUS
  Filled 2022-02-18 (×5): qty 100

## 2022-02-18 MED ORDER — FLUTICASONE FUROATE-VILANTEROL 200-25 MCG/ACT IN AEPB
1.0000 | INHALATION_SPRAY | Freq: Every day | RESPIRATORY_TRACT | Status: DC
  Administered 2022-02-18 – 2022-02-20 (×3): 1 via RESPIRATORY_TRACT
  Filled 2022-02-18: qty 28

## 2022-02-18 MED ORDER — MIDAZOLAM HCL 5 MG/5ML IJ SOLN
INTRAMUSCULAR | Status: DC | PRN
  Administered 2022-02-18 (×2): .5 mg via INTRAVENOUS

## 2022-02-18 MED ORDER — SODIUM CHLORIDE 0.9% FLUSH
5.0000 mL | Freq: Three times a day (TID) | INTRAVENOUS | Status: DC
  Administered 2022-02-18 – 2022-02-20 (×6): 5 mL

## 2022-02-18 NOTE — Assessment & Plan Note (Signed)
Pressure Injury 02/17/22 Buttocks Medial;Right;Left;Upper Stage 2 -  Partial thickness loss of dermis presenting as a shallow open injury with a red, pink wound bed without slough. pink and red (Active)  02/17/22 2200  Location: Buttocks  Location Orientation: Medial;Right;Left;Upper  Staging: Stage 2 -  Partial thickness loss of dermis presenting as a shallow open injury with a red, pink wound bed without slough.  Wound Description (Comments): pink and red  Present on Admission: Yes   Stage II decubitus ulcer, present on admission.  Patient received wound care to be changed every 3 days, will continue upon discharge.

## 2022-02-18 NOTE — Progress Notes (Addendum)
Pt BP at 93/48 MAP 61. Pt asymtomatic at this time and have LR running at 50 ml/hr. Ostomy has been changed three times, one by ED and twice on my shift but it still leaks even with the sealant on. NP Randol Kern made aware. Will continue to monitor.  Update 0013: Wound ostomy nurse consulted. Per NP Randol Kern to continue monitor pt at this time. Will continue to monitor.  Update 0202: Pt BP at 104/49 Map 64. NP Randol Kern made aware. Will continue to monitor.  Update [phone]: Notice some blood drainage from the ostomy bag. NP Randol Kern made aware. Will continue to monitor.

## 2022-02-18 NOTE — Progress Notes (Signed)
   02/17/22 2356  Assess: MEWS Score  Temp 99.3 F (37.4 C)  BP (!) 93/48  MAP (mmHg) (!) 61  Pulse Rate (!) 109  Resp 18  SpO2 99 %  O2 Device Room Air  Assess: MEWS Score  MEWS Temp 0  MEWS Systolic 1  MEWS Pulse 1  MEWS RR 0  MEWS LOC 0  MEWS Score 2  MEWS Score Color Yellow  Assess: if the MEWS score is Yellow or Red  Were vital signs taken at a resting state? Yes  Focused Assessment Change from prior assessment (see assessment flowsheet)  Does the patient meet 2 or more of the SIRS criteria? Yes  Does the patient have a confirmed or suspected source of infection? No  Provider and Rapid Response Notified? No  MEWS guidelines implemented *See Row Information* No, previously yellow, continue vital signs every 4 hours  Treat  MEWS Interventions Administered scheduled meds/treatments  Pain Scale 0-10  Pain Score 0  Notify: Charge Nurse/RN  Name of Charge Nurse/RN Notified Myrtis Hopping, RN  Date Charge Nurse/RN Notified 02/18/22  Time Charge Nurse/RN Notified 0015  Notify: Provider  Provider Name/Title Sharion Settler  Date Provider Notified 02/18/22  Time Provider Notified 0000  Method of Notification Page  Notification Reason Change in status  Provider response No new orders  Date of Provider Response 02/18/22  Time of Provider Response 0013  Assess: SIRS CRITERIA  SIRS Temperature  0  SIRS Pulse 1  SIRS Respirations  0  SIRS WBC 1  SIRS Score Sum  2

## 2022-02-18 NOTE — Consult Note (Addendum)
[place] Nurse ostomy consult note Stoma type/location: RUQ loop ileostomy   Stomal assessment/size: 1" flush stoma with stomal separation at 3 and 9 o'clock.  Extends 2 cm each side.  Skin is red but intact to peristomal area.  Peristomal assessment: red, tender, intact There is a 0.3 cm ruptured blister to 9 o'clock consistent with medical adhesive related skin injury.  Midline open abdominal incision Treatment options for stomal/peristomal skin: skin prep, barrier ring and 1 piece convex pouch.   Output soft brown stool Ostomy pouching: 1pc.convex with barrier ring Education provided: Spouse and daughter in room.  We discuss the separation, possible etiology (abdominal swelling) and that this area will be covered with barrier ring and barrier is to be cut to the size of the stoma.  New pouch applied.   Please send home with 1 piece convex pouches and only use 1 piece convex pouches while inpatient.  Going down to have Percutaneous drains placed today due to two abscesses. Enrolled patient in [place] program: Yes prior admission WOC Nurse Consult Note: Reason for Consult:midline abdominal wound with dehiscence at proximal and distal ends.  We are packing this area daily.  Having percutaneous drains placed today due to abscess.  Stage 2 pressure injury to sacrum Wound type:surgical, infectious Pressure Injury POA: NO Drainage (amount, consistency, odor) moderate serosanguinous with minimal purulence at distal end.  Periwound:RUQ ileostomy Dressing procedure/placement/frequency: Sacral foam to sacral pressure injury. Change every three days and PRN soilage.  Cleanse abdominal wound with NS and pat dry. Pack distal and proximal open ends with Aquacel (cut into strip) and cover with gauze/ABD pad and tape.  Change daily.    Will not follow at this time.  Please re-consult if needed.  Domenic Moras MSN, RN, FNP-BC CWON Wound, Ostomy, Continence Nurse Pager [phone]

## 2022-02-18 NOTE — Assessment & Plan Note (Addendum)
Patient met sepsis criteria on admission with abdominal abscess, leukocytosis and heart rate greater than 90.  Treated with IV fluids and antibiotics.  Sepsis itself felt to be stabilized.  Status post drains by interventional radiology.  Patient initially responded well to broad-spectrum antibiotics.  Fluid cultures preliminary came back with abundant gram-positive cocci in clusters and moderate gram-negative rods.  Plan is to discharge patient on Cipro and Flagyl x7 days for total of 10 days of therapy.  If gram-positive cocci in clusters comes back as MRSA, can add Bactrim to his regimen.  Patient will follow-up with general surgery in 1 week.  At that time, abscess can be assessed to see if there is any residual and if not, when drains can be removed, either by general surgery or interventional radiology.

## 2022-02-18 NOTE — Assessment & Plan Note (Signed)
CT of the abdomen found collections in the abdomen consistent with abscess. General surgery Dr. Peyton Najjar at bedside and plan for possible IR guided aspiration tomorrow.  1. Minimally motion degraded exam. 2. Interval near complete colectomy with rectal stump creation. Bilateral ill-defined gas and fluid collections within the pelvis, most consistent with abscesses. These track from the rectal stump site, and anastomotic leak cannot be excluded. 3. Abdominopelvic laparotomy sites. Gas deep to pelvic surgical sutures is intimately associated with adjacent bowel and fistulous communication cannot be excluded (especially given hyperattenuating material within the defect, possibly contrast). 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  We will continue patient on IV antibiotics.

## 2022-02-18 NOTE — Assessment & Plan Note (Addendum)
Abnormal thyroid function studies including mildly elevated TSH and free T4.  Given acute illness, would not make a formal diagnosis and would recommend rechecking labs in 6 weeks.

## 2022-02-18 NOTE — Progress Notes (Signed)
Patient clinically stable post bilateral abscess drain placement per DR Anselm Pancoast, tolerated well. Vitals stable pre and post procedure. Denies complaints post procedure. Specimens sent to lab. Received Versed 1 mg along with Fentanyl 75 mcg Iv for procedure. Report given to patients care nurse post procedure. Update given to patients daughter post procedure/recovery with questions answered.

## 2022-02-18 NOTE — Care Management Important Message (Signed)
Important Message  Patient Details  Name: Lindsey Thomas MRN: [id number] Date of Birth: 1941/02/21   Medicare Important Message Given:  N/A - LOS <3 / Initial given by admissions     Dannette Kathryn 02/18/2022, 6:49 PM

## 2022-02-18 NOTE — Assessment & Plan Note (Signed)
Secondary to dehydration.  Should improve

## 2022-02-18 NOTE — Plan of Care (Signed)
  Problem: Clinical Measurements: Goal: Signs and symptoms of infection will decrease Outcome: Progressing   Problem: Respiratory: Goal: Ability to maintain adequate ventilation will improve Outcome: Progressing   Problem: Education: Goal: Knowledge of General Education information will improve Description: Including pain rating scale, medication(s)/side effects and non-pharmacologic comfort measures Outcome: Progressing   Problem: Pain Managment: Goal: General experience of comfort will improve Outcome: Progressing   Problem: Safety: Goal: Ability to remain free from injury will improve Outcome: Progressing   Problem: Clinical Measurements: Goal: Signs and symptoms of infection will decrease Outcome: Progressing   Problem: Respiratory: Goal: Ability to maintain adequate ventilation will improve Outcome: Progressing   Problem: Education: Goal: Knowledge of General Education information will improve Description: Including pain rating scale, medication(s)/side effects and non-pharmacologic comfort measures Outcome: Progressing   Problem: Pain Managment: Goal: General experience of comfort will improve Outcome: Progressing   Problem: Safety: Goal: Ability to remain free from injury will improve Outcome: Progressing

## 2022-02-18 NOTE — Progress Notes (Signed)
[facility] Progress Note  Patient: Lindsey Thomas    ALP:[id number]  DOA: 02/17/2022    Date of Service: the patient was seen and examined on 02/18/2022  Brief hospital course: 81 year old female with past medical history of emphysema, hypothyroidism and non-small cell lung cancer of the right lung status post radiation treatment who had had a recent hospitalization for 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  Patient seen by general surgery and IR consulted, who took patient for placement of by lateral CT-guided drains of the abdomen on 9/14.  Assessment and Plan: Assessment and Plan: Sepsis ([place]) secondary to postprocedural abdominal abscess Patient met sepsis criteria on admission with abdominal abscess, leukocytosis and heart rate greater than 90.  Treated with IV fluids and antibiotics.  Labs are improving, sepsis itself felt to be stabilized.  Status post drains by interventional radiology.  Patient okay for p.o.  Follow-up on fluid cultures.  Pressure injury of skin Pressure Injury 02/17/22 Buttocks Medial;Right;Left;Upper Stage 2 -  Partial thickness loss of dermis presenting as a shallow open injury with a red, pink wound bed without slough. pink and red (Active)  02/17/22 2200  Location: Buttocks  Location Orientation: Medial;Right;Left;Upper  Staging: Stage 2 -  Partial thickness loss of dermis presenting as a shallow open injury with a red, pink wound bed without slough.  Wound Description (Comments): pink and red  Present on Admission: Yes      Anemia  > Anemia with a hemoglobin of 8 on admission, MCV of 86 and normal platelet count and a normal RDW.  Minimal change today.  Suspect  combination mostly though of blood loss as patient had procedures done last month.  We will transfuse type and screen.  Hyponatremia Secondary to dehydration.  Should improve  GERD (gastroesophageal reflux disease) Will change IV PPI to p.o.   Abnormal thyroid function test Abnormal thyroid function studies including mildly elevated TSH and free T4.  Given acute illness, would not make a formal diagnosis and would recommend rechecking labs in 6 weeks.   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    Pressure Injury 02/17/22 Buttocks Medial;Right;Left;Upper Stage 2 -  Partial thickness loss of dermis presenting as a shallow open injury with a red, pink wound bed without slough. pink and red (Active)  02/17/22 2200  Location: Buttocks  Location Orientation: Medial;Right;Left;Upper  Staging: Stage 2 -  Partial thickness loss of dermis presenting as a shallow open injury with a red, pink wound bed without slough.  Wound Description (Comments): pink and red  Present on Admission: Yes  Dressing Type Foam - Lift dressing to assess site every shift 02/18/22 0930     Consultants: General surgery Interventional radiology  Procedures: Placement bilateral CT guided drainage tubes of the abdomen  Antimicrobials: IV Flagyl 9/14-present IV vancomycin 9/13-present IV cefepime 9/13-present  Code Status: Full code   Subjective: Patient resting comfortably, seen before CT-guided tube placement  Objective: Vital signs were reviewed and unremarkable. Vitals:   02/18/22 1614 02/18/22 1630  BP: (!) 93/47 (!) 94/48  Pulse: 99 99  Resp:  18  Temp:    SpO2:  100% 100%    Intake/Output Summary (Last 24 hours) at 02/18/2022 1730 Last data filed at 02/18/2022 0617 Gross per 24 hour  Intake 1563.17 ml  Output 1330 ml  Net 233.17 ml   Filed Weights   02/17/22 1930  Weight: 78.5 kg   Body mass  index is 37.45 kg/m.  Exam:  General: Resting comfortably HEENT: Normocephalic, atraumatic, mucous membranes are moist Cardiovascular: Regular rate and rhythm, S1-S2 Respiratory: Clear to auscultation bilaterally Abdomen: Soft, moderate tenderness in the lower quadrants, nondistended, hypoactive bowel sounds Musculoskeletal: Clubbing or cyanosis or edema Skin: No skin breaks, tears or lesions other than surgical site from recent surgery noted. Psychiatry: Appropriate, no evidence of psychoses Neurology: No focal deficits  Data Reviewed: Improvement in white blood cell count  Disposition:  Status is: Inpatient Remains inpatient appropriate because:  -Stabilization of infection -Clearance by general surgery    Anticipated discharge date: 9/16   Family Communication: Husband at the bedside DVT Prophylaxis: SCDs Start: 02/17/22 1943    Author: Annita Brod ,MD 02/18/2022 5:30 PM  To reach On-call, see care teams to locate the attending and reach out via [email or web address]. Between 7PM-7AM, please contact night-coverage If you still have difficulty reaching the attending provider, please page the [facility] (Director on Call) for [facility] on amion for assistance.

## 2022-02-18 NOTE — Assessment & Plan Note (Signed)
>   Electrolyte abnormalities: We will continue with IV fluid hydration for hyponatremia and other electrolyte correction

## 2022-02-18 NOTE — Procedures (Signed)
Interventional Radiology Procedure:   Indications: Post operative abscesses  Procedure: CT guided drain placement in RLQ abscess; CT guided drain placement in LLQ abscess  Findings: 10 Fr drain placed in bilateral lower abdominal abscesses.  Purulent fluid removed from both drains. Fluid sent for culture.    Complications: No immediate complications noted.     EBL: Minimal  Plan: Flush bilateral drains and follow output.     Kenon Delashmit R. Anselm Pancoast, MD  Pager: [phone]

## 2022-02-18 NOTE — TOC Initial Note (Addendum)
Transition of Care [facility]) - Initial/Assessment Note    Patient Details  Name: Lindsey Thomas MRN: [id number] Date of Birth: 02-02-1941  Transition of Care [facility]) CM/SW Contact:    Gildardo Griffes, LCSW Phone Number: 02/18/2022, 10:43 AM  Clinical Narrative:                  CSW met with patient and family at bedside, confirmed patient was just at [facility] for [facility] Term Rehab. They report they would like to take patient home and feel confident they are able to meet her needs with the addition of home health set up.   They report [facility] had already ordered patient wheelchair, rolling walker and bedside commode to be delivered ot home via [employer] however they have not received yet.   CSW has followed up with Ian Malkin with [employer] who will work on identifying ETA for home equipment delivery.   Patient is active with [facility]  Expected Discharge Plan: Home w Home Health Services Barriers to Discharge: Continued Medical Work up   Patient Goals and CMS Choice Patient states their goals for this hospitalization and ongoing recovery are:: to go home CMS Medicare.gov Compare Post Acute Care list provided to:: Patient Choice offered to / list presented to : Patient  Expected Discharge Plan and Services Expected Discharge Plan: Home w Home Health Services       Living arrangements for the past 2 months: Single Family Home                                      Prior Living Arrangements/Services Living arrangements for the past 2 months: Single Family Home Lives with:: Relatives                   Activities of Daily Living   ADL Screening (condition at time of admission) Patient's cognitive ability adequate to safely complete daily activities?: Yes Is the patient deaf or have difficulty hearing?: Yes Does the patient have difficulty seeing, even when wearing glasses/contacts?: No Does the patient have difficulty concentrating, remembering, or making decisions?: No Patient able  to express need for assistance with ADLs?: Yes Does the patient have difficulty dressing or bathing?: No Independently performs ADLs?: No Communication: Independent Dressing (OT): Needs assistance Is this a change from baseline?: Pre-admission baseline Grooming: Independent Feeding: Independent Bathing: Needs assistance Is this a change from baseline?: Pre-admission baseline Toileting: Needs assistance Is this a change from baseline?: Pre-admission baseline In/Out Bed: Needs assistance Is this a change from baseline?: Pre-admission baseline Walks in Home: Needs assistance Is this a change from baseline?: Pre-admission baseline Does the patient have difficulty walking or climbing stairs?: Yes Weakness of Legs: Both Weakness of Arms/Hands: None  Permission Sought/Granted                  Emotional Assessment       Orientation: : Oriented to Self, Oriented to Place, Oriented to  Time, Oriented to Situation Alcohol / Substance Use: Not Applicable Psych Involvement: No (comment)  Admission diagnosis:  Abscess [L02.91] Generalized abdominal pain [R10.84] Abdominal pain [R10.9] Patient Active Problem List   Diagnosis Date Noted   Anemia 02/18/2022   Electrolyte abnormality 02/18/2022   Sepsis (HCC) 02/17/2022   Hypomagnesemia 01/26/2022   Postprocedural intraabdominal abscess    Hyperkalemia    Metabolic acidosis 01/24/2022   Hypotension 01/24/2022   Sinus tachycardia 01/22/2022  Hypoglycemia 01/20/2022   Hypothyroidism    Hypokalemia    LBBB (left bundle branch block)    Bowel obstruction_sigmoid colon    Rectal bleeding 01/14/2022   Abdominal pain 01/14/2022   Nausea 01/14/2022   Hyponatremia 01/14/2022   Splenic artery aneurysm ([place]) 01/14/2022   GERD (gastroesophageal reflux disease)    Asthma    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.0 to 39.9    Lung nodule    Lower limb ulcer, calf, left, limited to breakdown of skin ([place]) 05/08/2021   Acute deep  vein thrombosis (DVT) of proximal vein of left lower extremity ([place]) 01/01/2021   Tendinitis of upper biceps tendon of right shoulder 12/15/2020   Rotator cuff tendinitis, right 12/15/2020   Dyspnea and respiratory abnormalities 02/18/2020   Lung cancer ([place]) 12/03/2018   Varicose veins of leg with pain, 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   Leg swelling 04/23/2016   Lymphedema 04/23/2016   Venous stasis 12/23/2015   Thyroid nodule 06/25/2015   Anxiety 12/31/2013   Aphasia 12/31/2013   Obesity 12/31/2013   Derangement of posterior horn of medial meniscus 09/13/2013   Neck pain 09/13/2013   Pure hypercholesterolemia 09/13/2013   Disorder of bursae and tendons in shoulder region 09/13/2013   PCP:  Idelle Crouch, MD Pharmacy:   [place], [place] - [address] [place] [address] [place] [address] Phone: [phone] Fax: [phone]  [place], [place] - [address] [place] [address] Phone: [phone] Fax: [phone]  [facility] - [place], [place] [address] Phone: [phone] Fax: [phone]     Social Determinants of Health (SDOH) Interventions    Readmission Risk Interventions     No data to display

## 2022-02-18 NOTE — Assessment & Plan Note (Signed)
Meets criteria for BMI greater than 30

## 2022-02-18 NOTE — Assessment & Plan Note (Addendum)
Will change IV PPI to p.o.

## 2022-02-18 NOTE — Consult Note (Signed)
Chief Complaint: Patient was seen in consultation today for pelvic fluid collections at the request of Herbert Pun, MD  Referring Physician(s): Herbert Pun, MD  Supervising Physician: Markus Daft  Patient Status: [place] - In-pt  History of Present Illness: Lindsey Thomas is a 81 y.o. female with significant recent PMHx of bowel obstruction secondary to sigmoid stricture s/p surgical intervention with pathology consistent with diverticulosis and negative for dysplasia and malignancy. The patient underwent sigmoid hemicolectomy with repair of cecal perforation and loop ileostomy creation on 8/16. Patient then developed anastomotic leak and underwent subtotal colectomy with end ileostomy on 8/20. She was discharged to SNF and has been having intermittent abdominal pain and purulent leakage at incision sites. She presented to ED yesterday from SNF for abdominal pain and purulent drainage from surgical sites. Surgical team has evaluated the patient and CT and labs have been done. Leukocytosis on labs and patient has been started on Vancomycin and cefepime, CT with 2 fluid collections in the pelvis and IR has been asked for consult and possible drainage.   Patient denies any significant abdominal pain currently, she does complain of ongoing drainage at LLQ and midline incisions. She denies any chest pain or shortness of breath. She does have sleep apnea but does not use a CPAP. She denies any complications to moderate sedation.  Past Medical History:  Diagnosis Date   Anxiety    Arthritis    Asthma    DVT (deep venous thrombosis) (HCC)    Edema    feet   Emphysema of lung (HCC)    GERD (gastroesophageal reflux disease)    Headache    MIGRAINES   History of orthopnea    Hypercholesterolemia    Hypothyroidism    NODULES   Lymphedema    Migraine    Non-small cell lung cancer, right ([place]) 05/2019   Rad tx's   Shortness of breath dyspnea    WITH EXERTION   Sleep  apnea    MILD, DOES NOT USE CPAP   Thyroid nodule     Past Surgical History:  Procedure Laterality Date   ABDOMINAL HYSTERECTOMY     BREAST BIOPSY     CARDIAC CATHETERIZATION     CATARACT EXTRACTION W/PHACO Left 02/12/2016   Procedure: CATARACT EXTRACTION PHACO AND INTRAOCULAR LENS PLACEMENT ([place]);  Surgeon: Eulogio Bear, MD;  Location: [facility] ORS;  Service: Ophthalmology;  Laterality: Left;  [place] 01:30 AP% 15.2 CDE 13.71 Fluid pack lot # [id number]   CATARACT EXTRACTION W/PHACO Right 03/18/2016   Procedure: CATARACT EXTRACTION PHACO AND INTRAOCULAR LENS PLACEMENT ([facility]);  Surgeon: Eulogio Bear, MD;  Location: [facility];  Service: Ophthalmology;  Laterality: Right;  Lot # [id number] [place]: 01:05.5 AP%:14.3 CDE: 9.33   COLECTOMY WITH COLOSTOMY CREATION/HARTMANN PROCEDURE N/A 01/20/2022   Procedure: COLECTOMY WITH COLOSTOMY CREATION/HARTMANN PROCEDURE;  Surgeon: Herbert Pun, MD;  Location: [facility];  Service: General;  Laterality: N/A;   FLEXIBLE SIGMOIDOSCOPY N/A 01/14/2022   Procedure: FLEXIBLE SIGMOIDOSCOPY;  Surgeon: Toledo, Benay Pike, MD;  Location: [facility];  Service: Gastroenterology;  Laterality: N/A;   FLEXIBLE SIGMOIDOSCOPY N/A 01/19/2022   Procedure: FLEXIBLE SIGMOIDOSCOPY;  Surgeon: Lesly Rubenstein, MD;  Location: [facility];  Service: Endoscopy;  Laterality: N/A;   FRACTURE SURGERY Left    arm rod and screw   KNEE ARTHROSCOPY     LAPAROTOMY N/A 01/24/2022   Procedure: EXPLORATORY LAPAROTOMY;  Surgeon: Herbert Pun, MD;  Location: [facility];  Service: General;  Laterality: N/A;   OOPHORECTOMY  RCR     ROTATOR CUFF REPAIR Left 2006    Allergies: Eryc [erythromycin], Levofloxacin, Percocet [oxycodone-acetaminophen], Augmentin [amoxicillin-pot clavulanate], and Celecoxib  Medications: Prior to Admission medications   Medication Sig Start Date End Date Taking? Authorizing Provider  aspirin EC 81 MG tablet Take 81 mg by mouth daily.   Yes  [provider]  calcium carbonate (CALCIUM ANTACID) 500 MG chewable tablet Chew 2 tablets by mouth daily.   Yes [provider]  Cholecalciferol (VITAMIN D PO) Take 5,000 Units by mouth daily.   Yes [provider]  Cyanocobalamin (VITAMIN B-12 PO) Take 1,000 mcg by mouth daily.   Yes [provider]  fluticasone (FLONASE) 50 MCG/ACT nasal spray Place 2 sprays into both nostrils daily. 07/26/18  Yes [provider]  furosemide (LASIX) 20 MG tablet Take 20 mg by mouth daily. 10/10/17  Yes [provider]  loperamide (IMODIUM) 2 MG capsule Take 2 capsules (4 mg total) by mouth every 12 (twelve) hours. 01/30/22  Yes [name], MD  meloxicam (MOBIC) 7.5 MG tablet Take 1 tablet by mouth daily. 10/15/21  Yes [provider]  metoprolol tartrate (LOPRESSOR) 25 MG tablet Take 1 tablet (25 mg total) by mouth 2 (two) times daily. 01/30/22  Yes [name], MD  montelukast (SINGULAIR) 10 MG tablet Take 10 mg by mouth at bedtime.    Yes [provider]  Multiple Vitamins-Minerals (PRESERVISION AREDS PO) Take 1 capsule by mouth 2 (two) times daily.   Yes [provider]  omeprazole (PRILOSEC) 20 MG capsule Take 20 mg by mouth 2 (two) times daily before a meal.   Yes [provider]  Polyethyl Glycol-Propyl Glycol (SYSTANE) 0.4-0.3 % SOLN Apply 1 drop to eye 2 (two) times daily.   Yes [provider]  SYMBICORT 160-4.5 MCG/ACT inhaler Inhale 2 puffs into the lungs 2 (two) times daily. 06/19/21  Yes [provider]  acetaminophen (TYLENOL) 500 MG tablet Take 1,000 mg by mouth daily as needed for pain.    [provider]  feeding supplement (ENSURE ENLIVE / ENSURE PLUS) LIQD Take 237 mLs by mouth 3 (three) times daily between meals. 01/30/22   [name], MD  lidocaine (XYLOCAINE) 2 % solution Use as directed 15 mLs in the mouth or throat every 8 (eight) hours as needed for mouth pain. 01/30/22   [name], MD  magic mouthwash SOLN Take 5 mLs by mouth 4 (four) times daily. 01/30/22   [name], MD  nystatin (MYCOSTATIN/NYSTOP) powder Apply topically 3 (three) times daily. 01/30/22   [name], MD  ondansetron (ZOFRAN) 4 MG tablet Take 1 tablet (4 mg total) by mouth daily as needed for nausea or vomiting. 01/15/22 01/15/23  Enzo Bi, MD  traMADol (ULTRAM) 50 MG tablet Take 1 tablet (50 mg total) by mouth every 6 (six) hours as needed for severe pain or moderate pain. 01/30/22   [name], MD     Family History  Problem Relation Age of Onset   Emphysema Mother    Heart Problems Mother    Tuberculosis Father    Brain cancer Father    Skin cancer Father    Breast cancer Sister    Irritable bowel syndrome Sister    Lung cancer Brother    Dementia Sister    Schizophrenia Sister     Social History   Socioeconomic History   Marital status: Married    Spouse name: Not on file   Number of children: Not  on file   Years of education: Not on file   Highest education level: Not on file  Occupational History   Not on file  Tobacco Use   Smoking status: Never   Smokeless tobacco: Never  Substance and Sexual Activity   Alcohol use: No   Drug use: Never   Sexual activity: Not on file  Other Topics Concern   Not on file  Social History Narrative   Not on file   Social Determinants of Health   Financial Resource Strain: Not on file  Food Insecurity: Not on file  Transportation Needs: Not on file  Physical Activity: Not on file  Stress: Not on file  Social Connections: Not on file   Review of Systems: A 12 point ROS discussed and pertinent positives are indicated in the HPI above.  All other systems are negative.  Review of Systems  Vital Signs: BP (!) 101/49 (BP Location: Left Arm)   Pulse 96   Temp 98.2 F (36.8 C) (Oral)   Resp 18   Wt 173 lb 1 oz (78.5 kg) Comment: per 02/10/22 [facility] note  SpO2 100%   BMI 37.45 kg/m   Physical Exam Constitutional:      General:  She is not in acute distress. HENT:     Head: Normocephalic and atraumatic.  Cardiovascular:     Rate and Rhythm: Normal rate and regular rhythm.  Pulmonary:     Effort: Pulmonary effort is normal. No respiratory distress.  Abdominal:     General: There is no distension.     Palpations: Abdomen is soft.     Comments: Ostomy intact, midline incision with dressing intact and some drainage noted, LLQ incision with drainage intact and C/D  Neurological:     Mental Status: She is alert and oriented to person, place, and time.     Imaging: CT Abdomen Pelvis W Contrast  Result Date: 02/17/2022 CLINICAL DATA:  Sigmoid hemicolectomy 01/20/2022. Repair of cecal perforation. Loop ileostomy creation. Drains removed last week. Abdominal pain. Elevated white blood cell count. EXAM: CT ABDOMEN AND PELVIS WITH CONTRAST TECHNIQUE: Multidetector CT imaging of the abdomen and pelvis was performed using the standard protocol following bolus administration of intravenous contrast. RADIATION DOSE REDUCTION: This exam was performed according to the departmental dose-optimization program which includes automated exposure control, adjustment of the mA and/or kV according to patient size and/or use of iterative reconstruction technique. CONTRAST:  15mL OMNIPAQUE IOHEXOL 300 MG/ML  SOLN COMPARISON:  02/01/2022 FINDINGS: Lower chest: Motion degradation within the lower chest. Tiny bibasilar pulmonary nodules are most likely incidental/benign and do not warrant imaging follow-up per consensus criteria. Example at maximally 3 mm.Mild cardiomegaly, without pericardial or pleural effusion. Hepatobiliary: Mild motion degradation continuing into the upper abdomen. Normal liver. The gallbladder is underdistended, without calcified stone or specific evidence of acute cholecystitis. Pancreas: Normal, without mass or ductal dilatation. Spleen: Normal in size, without focal abnormality. Adrenals/Urinary Tract: Normal adrenal glands.  Normal kidneys, without hydronephrosis. Normal urinary bladder. Stomach/Bowel: Normal stomach, without wall thickening. Interval Hartmann's pouch creation and new complete colectomy. Right lower quadrant ileostomy again identified. Ill-defined right pelvic fluid and gas of 6.0 x 5.1 cm on 57/2 is contiguous with the rectal stump, new since 01/24/2022. ill-defined left pelvic fluid and gas collection measures 6.7 x 4.3 cm on 57/2, also likely tracking from the rectal stump including on 63/2. Extends to the left adnexa and superior aspect of the left side of the vaginal cuff including on 66/2. More  ill-defined gas within the ileocolic mesentery including on 50/2. Vascular/Lymphatic: Aortic atherosclerosis. 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 No abdominopelvic adenopathy. Reproductive: Hysterectomy.  No adnexal mass. Other: No free pelvic fluid. Pelvic midline laparotomy included on 66/2. Hyperattenuation material within could represent contrast. Gas just deep to the laparotomy site on 63/2 is intimately associated with the adjacent bowel loops. No well-defined fluid collection. There is also midline abdominal wall presumably laparotomy induced gas on 49/2. Musculoskeletal: No acute osseous abnormality. Grade 1 L3-4 anterolisthesis with advanced lumbosacral spondylosis. IMPRESSION: 1. Minimally motion degraded exam. 2. Interval near complete colectomy with rectal stump creation. Bilateral ill-defined gas and fluid collections within the pelvis, most consistent with abscesses. These track from the rectal stump site, and anastomotic leak cannot be excluded. 3. Abdominopelvic laparotomy sites. Gas deep to pelvic surgical sutures is intimately associated with adjacent bowel and fistulous communication cannot be excluded (especially given hyperattenuating material within the defect, possibly contrast). 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 Electronically Signed   By: Abigail Miyamoto M.D.   On: 02/17/2022 18:08   DG Chest 2  View  Result Date: 02/17/2022 CLINICAL DATA:  Suspected sepsis EXAM: CHEST - 2 VIEW COMPARISON:  11/29/2020 FINDINGS: Linear atelectasis in the right upper lobe. No confluent airspace opacities otherwise. Heart is normal size. Mediastinal contours within normal limits. Aortic atherosclerosis. No effusions or acute bony abnormality. IMPRESSION: Platelike right upper lobe atelectasis. No active disease. Electronically Signed   By: Rolm Baptise M.D.   On: 02/17/2022 17:09   [place] OR NERVE BLOCK-IMAGE ONLY [facility])  Result Date: 01/24/2022 There is no interpretation for this exam.  This order is for images obtained during a surgical procedure.  Please See "Surgeries" Tab for more information regarding the procedure.   CT ABDOMEN PELVIS W CONTRAST  Result Date: 01/24/2022 CLINICAL DATA:  Postop abdominal pain. Increased white blood cell. Rule out abscess. Surgery 01/20/2022 with sigmoid hemicolectomy, repair cecal perforation in transverse serosal tear and loop ileostomy creation. EXAM: CT ABDOMEN AND PELVIS WITH CONTRAST TECHNIQUE: Multidetector CT imaging of the abdomen and pelvis was performed using the standard protocol following bolus administration of intravenous contrast. RADIATION DOSE REDUCTION: This exam was performed according to the departmental dose-optimization program which includes automated exposure control, adjustment of the mA and/or kV according to patient size and/or use of iterative reconstruction technique. CONTRAST:  160mL OMNIPAQUE IOHEXOL 300 MG/ML  SOLN COMPARISON:  Preoperative CT 01/18/2022, CT 01/13/2022 FINDINGS: Lower chest: Trace pleural thickening without significant effusion. No basilar airspace disease. Hepatobiliary: Possible mild hepatic steatosis. No focal liver lesion. No biliary dilatation. Mild gallbladder distension but no calcified gallstone or pericholecystic fat stranding. Pancreas: No ductal dilatation or inflammation. Spleen: Normal in size. Two small linear  subcapsular regions in the spleen, 8 mm superiorly series 2, image 20, 13 mm laterally series 2, image 23. These are new from prior exam. There is no adjacent perisplenic fluid or subcapsular hematoma. Adrenals/Urinary Tract: Normal adrenal glands. No hydronephrosis. No focal renal abnormality. Decompressed urinary bladder, not well assessed. Stomach/Bowel: Fluid distended stomach. No gastric wall thickening. Fluid-filled small bowel particularly proximally. There is moderate small bowel wall thickening in the mid abdomen. Mild diffuse small bowel wall enhancement. Interval right lower quadrant loop ileostomy. Decreased colonic distension from preoperative imaging. There is wall thickening of the descending and sigmoid colon. Surgery in the mid sigmoid colon, series 2, image 70. There is mild air adjacent to the suture line posteriorly tracking inferior and superiorly, for example series  2 images 67 through 73 and adjacent. Moderate volume of free air is also seen scattered throughout the anterior abdomen with small volume of free air in the mesentery. There is associated free fluid adjacent to this free air, as well as moderate volume of free fluid throughout the abdomen and pelvis. Vascular/Lymphatic: Aortic atherosclerosis. Patent portal vein. There is mild generalized mesenteric edema. Limited assessment for adenopathy. Reproductive: Status post hysterectomy. No adnexal masses. Other: Recent midline laparotomy. There is scattered free air, greatest in the mid anterior abdomen, also in the right upper quadrant, scattered throughout the mesentery and in the left pelvis. Moderate volume of free fluid in the lower abdomen and pelvis. Free fluid is mildly complicated in density. Short TE this appears to be non loculated, however there may be developing peripheral enhancement involving fluid in the right pelvis with possible developing collection measuring 6.7 x 3.4 x 11.1 cm series 2, image 67 and series 5, image 69.  Small amount of perihepatic fluid, non organized. Diffuse subcutaneous edema of the anterior abdominal wall. No subcutaneous collection. Musculoskeletal: Stable osseous structures. Degenerative change in the spine and hips. IMPRESSION: 1. Recent midline laparotomy with right lower quadrant loop ileostomy. 2. There is a moderate volume of free air in the abdomen. There is air adjacent to the sigmoid suture line, but also throughout the anterior abdomen, and scattered in the mesentery. It is unclear if this represents residual postsurgical air or anastomotic leak. 3. Moderate volume of free fluid in the lower abdomen and pelvis. Majority of this fluid is not loculated, however is mildly complex in density. There may be developing peripheral enhancement involving fluid in the right pelvis with possible developing collection measuring 6.7 x 3.4 x 11.1 cm. 4. Two small linear subcapsular regions in the spleen, new from prior exam, suspicious for small splenic infarcts. No adjacent perisplenic fluid or subcapsular hematoma. 5. Diffuse small bowel enhancement, prominent fluid-filled proximal small bowel and distended stomach. Favor postoperative ileus, although the possibility of enteritis is also considered. Aortic Atherosclerosis (ICD10-I70.0). Electronically Signed   By: Keith Rake M.D.   On: 01/24/2022 16:34    Labs:  CBC: Recent Labs    01/24/22 0842 01/24/22 2014 01/25/22 0050 01/25/22 0545 02/17/22 1630  WBC 16.1*  --  16.7* 15.9* 15.0*  HGB 12.4 10.5* 10.6* 10.3* 8.0*  HCT 38.5 31.0* 34.8* 32.4* 24.8*  PLT 218  --  216 199 279    COAGS: Recent Labs    01/13/22 1138 01/18/22 2100 02/17/22 1630 02/18/22 0545  INR 1.0 1.2 1.3* 1.2  APTT 21* 29  --   --     BMP: Recent Labs    01/27/22 0946 01/29/22 0905 01/29/22 1412 01/30/22 0448 02/17/22 1630 02/18/22 0545  NA 138 136  --  135 129*  --   K 4.7 3.2* 3.7 3.8 3.5  --   CL 112* 105  --  106 99  --   CO2 21* 22  --  26 20*   --   GLUCOSE 108* 121*  --  111* 138*  --   BUN 12 12  --  14 9  --   CALCIUM 7.8* 7.8*  --  7.6* 7.8*  --   CREATININE 0.43* 0.42*  --  0.38* 0.52 0.51  GFRNONAA >60 >60  --  >60 >60 >60    LIVER FUNCTION TESTS: Recent Labs    05/12/21 1018 01/12/22 2051 01/18/22 0929 02/17/22 1630  BILITOT 0.6 0.6 1.1 0.3  AST  22 18 29  12*  ALT 15 15 27 9   ALKPHOS 105 102 75 77  PROT 6.8 6.1* 5.9* 5.4*  ALBUMIN 4.1 3.9 3.4* 2.0*    Assessment and Plan: This is a 81 year old female with significant recent PMHx of sigmoid stricture s/p surgical intervention with pathology consistent with diverticulosis and negative for dysplasia and malignancy. The patient underwent sigmoid hemicolectomy with repair of cecal perforation and loop ileostomy creation on 8/16. Patient then developed anastomotic leak and underwent subtotal colectomy with end ileostomy on 8/20. She was discharged to SNF and has been having intermittent abdominal pain and leakage at incision sites. She presented to ED yesterday from SNF for abdominal pain and purulent drainage from surgical sites. Surgical team has evaluated the patient and CT and labs have been done. Leukocytosis on labs and patient has been started on Vancomycin and cefepime, CT with 2 fluid collections in the pelvis and IR has been asked for consult and possible drainage.   The patient has been NPO, no blood thinners taken outside of home aspirin 81mg , imaging, labs and vitals have been reviewed.  Risks and benefits discussed with the patient and her family bedside today including bleeding, infection, damage to adjacent structures, bowel perforation/fistula connection, sepsis and need for long term drain management with clinic follow-ups.  All of the patient's questions were answered, patient is agreeable to proceed. Consent signed and in chart.  Thank you for this interesting consult.  I greatly enjoyed meeting Lindsey Thomas and look forward to participating in their  care.  A copy of this report was sent to the requesting provider on this date.  Electronically Signed: Hedy Jacob, PA-C 02/18/2022, 10:50 AM   I spent a total of 20 Minutes in face to face in clinical consultation, greater than 50% of which was counseling/coordinating care for pelvic fluid collections.

## 2022-02-18 NOTE — Hospital Course (Addendum)
81 year old female with past medical history of emphysema, hypothyroidism and non-small cell lung cancer of the right lung status post radiation treatment who had had a recent hospitalization for 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  Patient seen by general surgery and IR consulted, who took patient for placement of by lateral CT-guided drains of the abdomen on 9/14.

## 2022-02-18 NOTE — Assessment & Plan Note (Signed)
Per general surgery recommendations n.p.o. after midnight with plan for possible IR guided aspiration. Continue with IV antibiotics and follow culture and sensitivity.

## 2022-02-18 NOTE — Assessment & Plan Note (Addendum)
>   Anemia with a hemoglobin of 8 on admission, MCV of 86 and normal platelet count and a normal RDW.  Minimal change today.  Suspect combination mostly though of blood loss as patient had procedures done last month.  We will transfuse type and screen.

## 2022-02-18 NOTE — Progress Notes (Addendum)
Pt BP is at 92/49 MAP 64 HR 99 and has scheduled metropol 25 mg tablet. NP Morrsion amde aware. Will continue to monitor.  Update 2224: Per NP Morrson hold metropolol tonight. Will contineu to monitor.

## 2022-02-18 NOTE — Progress Notes (Signed)
Patient ID: Lindsey Thomas, female   DOB: Sep 21, 1940, 81 y.o.   MRN: [id number]     [place] Hospital Day(s): 1.   Interval History: Patient seen and examined, no acute events or new complaints overnight. Patient reports feeling better this morning compared to yesterday.  She denies any nausea or vomiting.  She denies any significant abdominal pain.  The dressings that I put yesterday looks clean.  Vital signs in last 24 hours: [min-max] current  Temp:  [98.2 F (36.8 C)-99.3 F (37.4 C)] 98.2 F (36.8 C) (09/14 1024) Pulse Rate:  [74-109] 96 (09/14 1024) Resp:  [14-20] 18 (09/14 0356) BP: (93-126)/(48-59) 101/49 (09/14 1024) SpO2:  [94 %-100 %] 100 % (09/14 1024) Weight:  [78.5 kg] 78.5 kg (09/13 1930)       Weight: 78.5 kg (per 02/10/22 [facility] note)     Physical Exam:  Constitutional: alert, cooperative and no distress  Respiratory: breathing non-labored at rest  Cardiovascular: regular rate and sinus rhythm  Gastrointestinal: soft, non-tender, and non-distended  Labs:     Latest Ref Rng & Units 02/18/2022   11:40 AM 02/17/2022    4:30 PM 01/25/2022    5:45 AM  CBC  WBC 4.0 - 10.5 K/uL 12.4  15.0  15.9   Hemoglobin 12.0 - 15.0 g/dL 7.7  8.0  10.3   Hematocrit 36.0 - 46.0 % 24.0  24.8  32.4   Platelets 150 - 400 K/uL 273  279  199       Latest Ref Rng & Units 02/18/2022    5:45 AM 02/17/2022    4:30 PM 01/30/2022    4:48 AM  CMP  Glucose 70 - 99 mg/dL  138  111   BUN 8 - 23 mg/dL  9  14   Creatinine 0.44 - 1.00 mg/dL 0.51  0.52  0.38   Sodium 135 - 145 mmol/L  129  135   Potassium 3.5 - 5.1 mmol/L  3.5  3.8   Chloride 98 - 111 mmol/L  99  106   CO2 22 - 32 mmol/L  20  26   Calcium 8.9 - 10.3 mg/dL  7.8  7.6   Total Protein 6.5 - 8.1 g/dL  5.4    Total Bilirubin 0.3 - 1.2 mg/dL  0.3    Alkaline Phos 38 - 126 U/L  77    AST 15 - 41 U/L  12    ALT 0 - 44 U/L  9      Imaging studies: No new pertinent imaging studies   Assessment/Plan:   Assessment/Plan:  81 y.o. female with sepsis due to pelvic abscess. -Continue broad-spectrum antibiotic therapy -I discussed case with IR will try to drain bilateral pelvic abscess. -N.p.o. for procedure but can resume regular diet after procedure -Appreciate wound care nurses management of complex ileostomy and midline wound   Hyponatremia -This should resolve with adequate IV hydration   Sacral ulcer -Stage II sacral ulcer.  This is new and was present to a before she was admitted.  No need of debridement.  Recommended frequent position changing  Arnold Long, MD

## 2022-02-19 DIAGNOSIS — D62 Acute posthemorrhagic anemia: Secondary | ICD-10-CM

## 2022-02-19 DIAGNOSIS — E876 Hypokalemia: Secondary | ICD-10-CM | POA: Diagnosis not present

## 2022-02-19 DIAGNOSIS — E669 Obesity, unspecified: Secondary | ICD-10-CM | POA: Diagnosis not present

## 2022-02-19 DIAGNOSIS — A419 Sepsis, unspecified organism: Secondary | ICD-10-CM | POA: Diagnosis not present

## 2022-02-19 LAB — BASIC METABOLIC PANEL
Anion gap: 6 (ref 5–15)
BUN: 9 mg/dL (ref 8–23)
CO2: 21 mmol/L — ABNORMAL LOW (ref 22–32)
Calcium: 7.7 mg/dL — ABNORMAL LOW (ref 8.9–10.3)
Chloride: 107 mmol/L (ref 98–111)
Creatinine, Ser: 0.47 mg/dL (ref 0.44–1.00)
GFR, Estimated: >60 mL/min (ref >60)
Glucose, Bld: 104 mg/dL — ABNORMAL HIGH (ref 70–99)
Potassium: 3.2 mmol/L — ABNORMAL LOW (ref 3.5–5.1)
Sodium: 134 mmol/L — ABNORMAL LOW (ref 135–145)

## 2022-02-19 LAB — CBC
HCT: 22.1 % — ABNORMAL LOW (ref 36.0–46.0)
Hemoglobin: 7 g/dL — ABNORMAL LOW (ref 12.0–15.0)
MCH: 27.8 pg (ref 26.0–34.0)
MCHC: 31.7 g/dL (ref 30.0–36.0)
MCV: 87.7 fL (ref 80.0–100.0)
Platelets: 240 10*3/uL (ref 150–400)
RBC: 2.52 MIL/uL — ABNORMAL LOW (ref 3.87–5.11)
RDW: 14.9 % (ref 11.5–15.5)
WBC: 10.7 10*3/uL — ABNORMAL HIGH (ref 4.0–10.5)
nRBC: 0 % (ref 0.0–0.2)

## 2022-02-19 LAB — HEMOGLOBIN: Hemoglobin: 8.4 g/dL — ABNORMAL LOW (ref 12.0–15.0)

## 2022-02-19 LAB — PROCALCITONIN: Procalcitonin: 0.31 ng/mL

## 2022-02-19 LAB — PREPARE RBC (CROSSMATCH)

## 2022-02-19 LAB — BRAIN NATRIURETIC PEPTIDE: B Natriuretic Peptide: 108.5 pg/mL — ABNORMAL HIGH (ref 0.0–100.0)

## 2022-02-19 MED ORDER — ALUM & MAG HYDROXIDE-SIMETH 200-200-20 MG/5ML PO SUSP
30.0000 mL | Freq: Four times a day (QID) | ORAL | Status: DC | PRN
  Administered 2022-02-19: 30 mL via ORAL
  Filled 2022-02-19: qty 30

## 2022-02-19 MED ORDER — SODIUM CHLORIDE 0.9% IV SOLUTION: Freq: Once | INTRAVENOUS | Status: AC

## 2022-02-19 MED ORDER — VANCOMYCIN HCL IN DEXTROSE 1-5 GM/200ML-% IV SOLN
1000.0000 mg | INTRAVENOUS | Status: DC
  Filled 2022-02-19: qty 200

## 2022-02-19 MED ORDER — VANCOMYCIN HCL 750 MG/150ML IV SOLN: 750.0000 mg | INTRAVENOUS | Status: DC

## 2022-02-19 NOTE — Consult Note (Addendum)
Pharmacy Antibiotic Note  Lindsey Thomas is a 81 y.o. female admitted on 02/17/2022 with  wound infection . PMH significant for NSCLC, HLD, lymphedema, sleep apnea, COPD, recent hospitalization and surgery for sigmoid colon stricture and SBO. Recent surgery complicated by perforation s/p sigmoid colectomy with ileostomy and abscess. Of note, previous wound cultures from 01/24/2022 grew pan-sensitive pseudomonas. Patient presented with purulent drainage from wound site and elevated WBC. Pharmacy has been consulted for Vancomycin and Cefepime dosing. Metronidazole has also been added for anaerobic coverage d/t pt's abscess. Blood cx and wound cx are NGTD. WBCs trending down, from 12.4 >> 10.7. Pt is afebrile but tachycardic, as most recent HR was 106. Pt has soft Bps currently, with SBPs in the 90s. CrCl remains stable, with most recent Scr 0.47. Pt did receive contrast with imaging, however..  Plan: Continue vancomycin 1000 mg IV q36H Continue cefepime 2 g q12H and metronidazole 500 mg q12H Continue to monitor WBC and vital signs to assess for efficacy in antibiotics Continue to monitor renal function and culture results Obtain pre-steady state vanc level before next dose in light of potential transient contrast-induced nephropathy  Height: 4\' 9"  (144.8 cm) Weight: 72.2 kg (159 lb 2.8 oz) IBW/kg (Calculated) : 38.6  Temp (24hrs), Avg:98.6 F (37 C), Min:98.2 F (36.8 C), Max:99.3 F (37.4 C)  Recent Labs  Lab 02/17/22 1630 02/17/22 2126 02/18/22 0545 02/18/22 1140 02/19/22 0509  WBC 15.0*  --   --  12.4* 10.7*  CREATININE 0.52  --  0.51 0.49 0.47  LATICACIDVEN 0.9 1.9  --   --   --      Estimated Creatinine Clearance: 43.7 mL/min (by C-G formula based on SCr of 0.47 mg/dL).    Allergies  Allergen Reactions   Eryc [Erythromycin]    Levofloxacin Nausea Only   Percocet [Oxycodone-Acetaminophen] Nausea And Vomiting   Augmentin [Amoxicillin-Pot Clavulanate] Rash   Celecoxib  Palpitations    Antimicrobials this admission: 9/13 Vancomycin >>  9/13 Cefepime >>   Dose adjustments this admission: N/A  Microbiology results: 9/14 Wound cx: NGTD @ <12 hrs 9/13 BCx: NGTD @ 2 days   Thank you for allowing pharmacy to be a part of this patient's care.  Dara Hoyer, PharmD PGY-1 Pharmacy Resident 02/19/2022 9:06 AM

## 2022-02-19 NOTE — Plan of Care (Signed)
  Problem: Clinical Measurements: Goal: Signs and symptoms of infection will decrease Outcome: Progressing   Problem: Clinical Measurements: Goal: Respiratory complications will improve Outcome: Progressing   Problem: Activity: Goal: Risk for activity intolerance will decrease Outcome: Progressing   Problem: Pain Managment: Goal: General experience of comfort will improve Outcome: Progressing   Problem: Safety: Goal: Ability to remain free from injury will improve Outcome: Progressing

## 2022-02-19 NOTE — Assessment & Plan Note (Signed)
Replacing as needed 

## 2022-02-19 NOTE — Progress Notes (Signed)
Patient ID: Lindsey Thomas, female   DOB: 03-25-1941, 81 y.o.   MRN: [id number]     [facility] Hospital Day(s): 2.   Interval History: Patient seen and examined, no acute events or new complaints overnight. Patient reports feeling well this morning.  Patient had a bilateral pelvic abscess drained yesterday.  There has been adequate seropurulent output from the drain.  No issues from procedure.  Vital signs in last 24 hours: [min-max] current  Temp:  [98.2 F (36.8 C)-99.3 F (37.4 C)] 99 F (37.2 C) (09/15 0023) Pulse Rate:  [86-100] 98 (09/15 0023) Resp:  [11-20] 18 (09/15 0023) BP: (80-106)/(40-57) 106/51 (09/15 0023) SpO2:  [93 %-100 %] 93 % (09/15 0023) Weight:  [72.2 kg] 72.2 kg (09/15 0218)     Height: 4\' 9"  (144.8 cm) Weight: 72.2 kg BMI (Calculated): 34.43   Physical Exam:  Constitutional: alert, cooperative and no distress  Respiratory: breathing non-labored at rest  Cardiovascular: regular rate and sinus rhythm  Gastrointestinal: soft, non-tender, and non-distended.  No changes on ileostomy and midline wound.  Labs:     Latest Ref Rng & Units 02/19/2022    5:09 AM 02/18/2022   11:40 AM 02/17/2022    4:30 PM  CBC  WBC 4.0 - 10.5 K/uL 10.7  12.4  15.0   Hemoglobin 12.0 - 15.0 g/dL 7.0  7.7  8.0   Hematocrit 36.0 - 46.0 % 22.1  24.0  24.8   Platelets 150 - 400 K/uL 240  273  279       Latest Ref Rng & Units 02/19/2022    5:09 AM 02/18/2022   11:40 AM 02/18/2022    5:45 AM  CMP  Glucose 70 - 99 mg/dL 104  91    BUN 8 - 23 mg/dL 9  8    Creatinine 0.44 - 1.00 mg/dL 0.47  0.49  0.51   Sodium 135 - 145 mmol/L 134  134    Potassium 3.5 - 5.1 mmol/L 3.2  3.3    Chloride 98 - 111 mmol/L 107  101    CO2 22 - 32 mmol/L 21  23    Calcium 8.9 - 10.3 mg/dL 7.7  7.9      Imaging studies: No new pertinent imaging studies   Assessment/Plan:  81 y.o. female with sepsis due to pelvic abscess. -Sepsis has resolved uncontrolled with antibiotic therapy and  percutaneous drainage of the abscess -May be transition to oral antibiotic therapy -I agree with starting discharge planning.  I think that is reasonable the family and patient concerned to try to be discharged home with home health.  If they feel comfortable taking care of her wounds and physical therapy, that could be an option.   Sacral ulcer -Stage II sacral ulcer.  This is new and was present to a before she was admitted.  No need of debridement.  Recommended frequent position changing  Arnold Long, MD

## 2022-02-19 NOTE — Progress Notes (Signed)
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  Patient seen by general surgery and IR consulted, who took patient for placement of by lateral CT-guided drains of the abdomen on 9/14.  Patient's hemoglobin dropped to 7.0 on 9/15 and she was transfused 1 unit packed red blood cells.  Assessment and Plan: Assessment and Plan: Sepsis ([place]) secondary to postprocedural abdominal abscess Patient met sepsis criteria on admission with abdominal abscess, leukocytosis and heart rate greater than 90.  Treated with IV fluids and antibiotics.  Sepsis itself felt to be stabilized.  Status post drains by interventional radiology.  Follow-up on fluid cultures.  Responding well to antibiotics with white blood cell count improving.  Pressure injury of skin Pressure Injury 02/17/22 Buttocks Medial;Right;Left;Upper Stage 2 -  Partial thickness loss of dermis presenting as a shallow open injury with a red, pink wound bed without slough. pink and red (Active)  02/17/22 2200  Location: Buttocks  Location Orientation: Medial;Right;Left;Upper  Staging: Stage 2 -  Partial thickness loss of dermis presenting as a shallow open injury with a red, pink wound bed without slough.  Wound Description (Comments): pink and red  Present on Admission: Yes      Anemia Anemia  with a hemoglobin of 8 on admission, MCV of 86 and normal platelet count and a normal RDW.  Had dropped over the next few days and down to 7.0 today so transfused 1 unit packed red blood cells.  Suspect combination of chronic disease, but more so from acute blood loss as patient had procedures done last month.  Hyponatremia Secondary to dehydration.  Should improve  GERD (gastroesophageal reflux disease) Will change IV PPI to p.o.   Abnormal thyroid function test Abnormal thyroid function studies including mildly elevated TSH and free T4.  Given acute illness, would not make a formal diagnosis and would recommend rechecking labs in 6 weeks.   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.0 to 39.9 Meets criteria for BMI greater than 30  Hypokalemia Replacing as needed       Body mass index is 34.44 kg/m.    Pressure Injury 02/17/22 Buttocks Medial;Right;Left;Upper Stage 2 -  Partial thickness loss of dermis presenting as a shallow open injury with a red, pink wound bed without slough. pink and red (Active)  02/17/22 2200  Location: Buttocks  Location Orientation: Medial;Right;Left;Upper  Staging: Stage 2 -  Partial thickness loss of dermis presenting as a shallow open injury with a red, pink wound bed without slough.  Wound Description (Comments): pink and red  Present on Admission: Yes  Dressing Type Foam - Lift dressing to assess site every shift 02/18/22 0930     Consultants: General surgery Interventional radiology  Procedures: Placement bilateral CT guided drainage tubes of the abdomen Transfused 1 unit packed red blood cells 9/15  Antimicrobials: IV Flagyl 9/14-present IV vancomycin 9/13-present IV cefepime 9/13-present  Code Status: Full  code   Subjective: Patient doing okay, no complaints, no abdominal pain currently  Objective: Vital signs were reviewed and unremarkable. Vitals:   02/19/22 1241 02/19/22 1253  BP: (!) 140/73 (!) 110/54  Pulse: (!) 102 93   Resp:  18  Temp:  97.7 F (36.5 C)  SpO2:  (!) 87%    Intake/Output Summary (Last 24 hours) at 02/19/2022 1510 Last data filed at 02/19/2022 1405 Gross per 24 hour  Intake 995.24 ml  Output 1172 ml  Net -176.76 ml   Filed Weights   02/17/22 1930 02/19/22 0218  Weight: 78.5 kg 72.2 kg   Body mass index is 34.44 kg/m.  Exam:  General: Resting comfortably HEENT: Normocephalic, atraumatic, mucous membranes are moist Cardiovascular: Regular rate and rhythm, S1-S2 Respiratory: Clear to auscultation bilaterally Abdomen: Soft, moderate tenderness in the lower quadrants, nondistended, hypoactive bowel sounds Musculoskeletal: Clubbing or cyanosis or edema Skin: No skin breaks, tears or lesions other than surgical site from recent surgery noted. Psychiatry: Appropriate, no evidence of psychoses Neurology: No focal deficits  Data Reviewed: White blood cell count and procalcitonin improving.  Hemoglobin down to 7.  Disposition:  Status is: Inpatient Remains inpatient appropriate because:  -Stabilization of infection     Anticipated discharge date: 9/16   Family Communication: Left message for family DVT Prophylaxis: SCDs Start: 02/17/22 1943    Author: Sendil K Krishnan ,MD 02/19/2022 3:10 PM  To reach On-call, see care teams to locate the attending and reach out via [email or web address]. Between 7PM-7AM, please contact night-coverage If you still have difficulty reaching the attending provider, please page the DOC (Director on Call) for [facility] on amion for assistance.  

## 2022-02-20 DIAGNOSIS — E871 Hypo-osmolality and hyponatremia: Secondary | ICD-10-CM | POA: Diagnosis not present

## 2022-02-20 DIAGNOSIS — E669 Obesity, unspecified: Secondary | ICD-10-CM | POA: Diagnosis not present

## 2022-02-20 DIAGNOSIS — L0291 Cutaneous abscess, unspecified: Secondary | ICD-10-CM | POA: Diagnosis not present

## 2022-02-20 DIAGNOSIS — R946 Abnormal results of thyroid function studies: Secondary | ICD-10-CM | POA: Diagnosis not present

## 2022-02-20 LAB — TYPE AND SCREEN
ABO/RH(D): O POS
Antibody Screen: NEGATIVE
Unit division: 0

## 2022-02-20 LAB — BASIC METABOLIC PANEL
Anion gap: 6 (ref 5–15)
BUN: 10 mg/dL (ref 8–23)
CO2: 19 mmol/L — ABNORMAL LOW (ref 22–32)
Calcium: 8 mg/dL — ABNORMAL LOW (ref 8.9–10.3)
Chloride: 110 mmol/L (ref 98–111)
Creatinine, Ser: 0.35 mg/dL — ABNORMAL LOW (ref 0.44–1.00)
GFR, Estimated: >60 mL/min (ref >60)
Glucose, Bld: 119 mg/dL — ABNORMAL HIGH (ref 70–99)
Potassium: 3.1 mmol/L — ABNORMAL LOW (ref 3.5–5.1)
Sodium: 135 mmol/L (ref 135–145)

## 2022-02-20 LAB — CBC
HCT: 25.6 % — ABNORMAL LOW (ref 36.0–46.0)
Hemoglobin: 8.6 g/dL — ABNORMAL LOW (ref 12.0–15.0)
MCH: 28.4 pg (ref 26.0–34.0)
MCHC: 33.6 g/dL (ref 30.0–36.0)
MCV: 84.5 fL (ref 80.0–100.0)
Platelets: 220 10*3/uL (ref 150–400)
RBC: 3.03 MIL/uL — ABNORMAL LOW (ref 3.87–5.11)
RDW: 15.2 % (ref 11.5–15.5)
WBC: 10.8 10*3/uL — ABNORMAL HIGH (ref 4.0–10.5)
nRBC: 0 % (ref 0.0–0.2)

## 2022-02-20 LAB — BPAM RBC
Blood Product Expiration Date: 202310212359
ISSUE DATE / TIME: 202309151235
Unit Type and Rh: 5100

## 2022-02-20 MED ORDER — ACETAMINOPHEN 325 MG PO TABS
650.0000 mg | ORAL_TABLET | Freq: Four times a day (QID) | ORAL | Status: DC | PRN
  Administered 2022-02-20: 650 mg via ORAL
  Filled 2022-02-20: qty 2

## 2022-02-20 MED ORDER — METRONIDAZOLE 500 MG PO TABS: 500.0000 mg | ORAL_TABLET | Freq: Two times a day (BID) | ORAL | 0 refills | Status: AC

## 2022-02-20 MED ORDER — POTASSIUM CHLORIDE CRYS ER 20 MEQ PO TBCR
40.0000 meq | EXTENDED_RELEASE_TABLET | Freq: Once | ORAL | Status: AC
  Administered 2022-02-20: 40 meq via ORAL
  Filled 2022-02-20: qty 2

## 2022-02-20 MED ORDER — CIPROFLOXACIN HCL 500 MG PO TABS: 500.0000 mg | ORAL_TABLET | Freq: Two times a day (BID) | ORAL | 0 refills | Status: AC

## 2022-02-20 MED ORDER — METOPROLOL TARTRATE 25 MG PO TABS: 25.0000 mg | ORAL_TABLET | Freq: Two times a day (BID) | ORAL | 0 refills | Status: AC

## 2022-02-20 NOTE — Progress Notes (Signed)
Patient ID: Lindsey Thomas, female   DOB: 02-25-1941, 81 y.o.   MRN: [id number]     [facility] Hospital Day(s): 3.   Interval History: Patient seen and examined, no acute events or new complaints overnight. Patient reports feeling well. Patient in very good mood. She just finished PT and did great. No complains of wound.   Vital signs in last 24 hours: [min-max] current  Temp:  [97.7 F (36.5 C)-99 F (37.2 C)] 98.1 F (36.7 C) (09/16 0739) Pulse Rate:  [79-103] 84 (09/16 0739) Resp:  [16-20] 18 (09/16 0739) BP: (110-143)/(46-73) 111/46 (09/16 0739) SpO2:  [87 %-100 %] 99 % (09/16 0739)     Height: 4\' 9"  (144.8 cm) Weight: 72.2 kg BMI (Calculated): 34.43   Physical Exam:  Constitutional: alert, cooperative and no distress  Respiratory: breathing non-labored at rest  Cardiovascular: regular rate and sinus rhythm  Gastrointestinal: soft, non-tender, and non-distended. Left lower quadrant wound with less drainage. Lower midline wound with less drainage. Ileostomy pink and patent.   Labs:     Latest Ref Rng & Units 02/20/2022    5:45 AM 02/19/2022    7:54 PM 02/19/2022    5:09 AM  CBC  WBC 4.0 - 10.5 K/uL 10.8   10.7   Hemoglobin 12.0 - 15.0 g/dL 8.6  8.4  7.0   Hematocrit 36.0 - 46.0 % 25.6   22.1   Platelets 150 - 400 K/uL 220   240       Latest Ref Rng & Units 02/20/2022    5:45 AM 02/19/2022    5:09 AM 02/18/2022   11:40 AM  CMP  Glucose 70 - 99 mg/dL 119  104  91   BUN 8 - 23 mg/dL 10  9  8    Creatinine 0.44 - 1.00 mg/dL 0.35  0.47  0.49   Sodium 135 - 145 mmol/L 135  134  134   Potassium 3.5 - 5.1 mmol/L 3.1  3.2  3.3   Chloride 98 - 111 mmol/L 110  107  101   CO2 22 - 32 mmol/L 19  21  23    Calcium 8.9 - 10.3 mg/dL 8.0  7.7  7.9     Imaging studies: No new pertinent imaging studies   Assessment/Plan:  81 y.o. female with sepsis due to pelvic abscess. -Sepsis has resolved uncontrolled with antibiotic therapy and percutaneous drainage of the abscess -  Wound with decreased drainage.  -I changed the ileostomy due to leakage.  -Agree to transition to oral antibiotics.    Sacral ulcer -Stage II sacral ulcer.  This is new and was present to a before she was admitted.  No need of debridement.  Recommended frequent position changing  Arnold Long, MD

## 2022-02-20 NOTE — Evaluation (Signed)
Physical Therapy Evaluation Patient Details Name: Lindsey Thomas MRN: [id number] DOB: [birth year] Today's Date: 02/20/2022  History of Present Illness  Pt admitted for sepsis secondary to abdominal abscess from STR. Pt is s/p L abdominal abscess drainage with B drains placed on 02/18/22. History includes emphysema, lung cancer s/p radiation and was recently at [facility].  Clinical Impression  Pt is a pleasant 81 year old female who was admitted for sepsis secondary to abdominal abscess with drain placement. Pt performs bed mobility, transfers, and ambulation with min assist and RW with minimal distance ambulated this date due to weakness. Pt is significantly stronger than previous admission and has great family support and equipment for success at home. Pt demonstrates deficits with strength/mobility/pain. Educated family on repositioning and assistance required for home discharge. Would benefit from skilled PT to address above deficits and promote optimal return to PLOF. Recommend transition to [facility] upon discharge from acute hospitalization.      Recommendations for follow up therapy are one component of a multi-disciplinary discharge planning process, led by the attending physician.  Recommendations may be updated based on patient status, additional functional criteria and insurance authorization.  Follow Up Recommendations Home health PT Can patient physically be transported by private vehicle: Yes    Assistance Recommended at Discharge Frequent or constant Supervision/Assistance  Patient can return home with the following  A little help with walking and/or transfers;A little help with bathing/dressing/bathroom;Help with stairs or ramp for entrance    Equipment Recommendations None recommended by PT  Recommendations for Other Services       Functional Status Assessment Patient has had a recent decline in their functional status and demonstrates the ability to make significant improvements in  function in a reasonable and predictable amount of time.     Precautions / Restrictions Precautions Precautions: Fall Restrictions Weight Bearing Restrictions: No      Mobility  Bed Mobility Overal bed mobility: Needs Assistance Bed Mobility: Supine to Sit, Sit to Supine     Supine to sit: Min assist Sit to supine: Mod assist   General bed mobility comments: needs cues for sequencing and pt guarded with mobility. Once seated at EOB, upright posture noted. Min assist for scooting out towards EOB for foot placement on floor.    Transfers Overall transfer level: Needs assistance Equipment used: Rolling walker (2 wheels) Transfers: Sit to/from Stand, Bed to chair/wheelchair/BSC Sit to Stand: Min assist           General transfer comment: cues for ant translation over BOS. Once standing, upright posture with RW.    Ambulation/Gait Ambulation/Gait assistance: Min assist Gait Distance (Feet): 3 Feet Assistive device: Rolling walker (2 wheels) Gait Pattern/deviations: Step-to pattern       General Gait Details: able to take small steps to sink and back to bed and then lateral steps up towards [facility] for proper positioning. Safe technique with use of RW  Stairs            Wheelchair Mobility    Modified Rankin (Stroke Patients Only)       Balance Overall balance assessment: Needs assistance Sitting-balance support: Feet supported, Bilateral upper extremity supported Sitting balance-Leahy Scale: Fair     Standing balance support: During functional activity, Bilateral upper extremity supported, Reliant on assistive device for balance Standing balance-Leahy Scale: Fair  Pertinent Vitals/Pain Pain Assessment Pain Assessment: Faces Faces Pain Scale: Hurts a little bit Pain Location: abdomen Pain Descriptors / Indicators: Discomfort, Grimacing Pain Intervention(s): Limited activity within patient's tolerance    Home  Living Family/patient expects to be discharged to:: Private residence Living Arrangements: Spouse/significant other;Children Available Help at Discharge: Available 24 hours/day Type of Home: House Home Access: Stairs to enter Entrance Stairs-Rails: None Entrance Stairs-Number of Steps: 2   Home Layout: One level Home Equipment: Conservation officer, nature (2 wheels);Cane - single point;Wheelchair - manual;BSC/3in1 (lift chair)      Prior Function Prior Level of Function : Needs assist             Mobility Comments: Pt reports ambulating short distance at SNF using RW       Hand Dominance        Extremity/Trunk Assessment   Upper Extremity Assessment Upper Extremity Assessment: Generalized weakness (B UE grossly 3+/5)    Lower Extremity Assessment Lower Extremity Assessment: Generalized weakness (B LE grossly 3+/5)       Communication   Communication: No difficulties  Cognition Arousal/Alertness: Awake/alert Behavior During Therapy: WFL for tasks assessed/performed Overall Cognitive Status: Within Functional Limits for tasks assessed                                 General Comments: pleasant, however guarded during session        General Comments      Exercises Other Exercises Other Exercises: education given regarding role of therapy, frequent positioning, and sitting in recliner for meals   Assessment/Plan    PT Assessment Patient needs continued PT services  PT Problem List Decreased strength;Decreased range of motion;Decreased activity tolerance;Decreased balance;Decreased mobility;Decreased knowledge of use of DME;Decreased safety awareness;Pain;Cardiopulmonary status limiting activity       PT Treatment Interventions DME instruction;Gait training;Functional mobility training;Therapeutic activities;Therapeutic exercise;Balance training;Neuromuscular re-education;Patient/family education    PT Goals (Current goals can be found in the Care Plan  section)  Acute Rehab PT Goals Patient Stated Goal: to go home with family PT Goal Formulation: With patient Time For Goal Achievement: 03/06/22 Potential to Achieve Goals: Good    Frequency Min 2X/week     Co-evaluation               AM-PAC PT "6 Clicks" Mobility  Outcome Measure Help needed turning from your back to your side while in a flat bed without using bedrails?: A Little Help needed moving from lying on your back to sitting on the side of a flat bed without using bedrails?: A Little Help needed moving to and from a bed to a chair (including a wheelchair)?: A Little Help needed standing up from a chair using your arms (e.g., wheelchair or bedside chair)?: A Little Help needed to walk in hospital room?: A Lot Help needed climbing 3-5 steps with a railing? : A Lot 6 Click Score: 16    End of Session   Activity Tolerance: Patient tolerated treatment well Patient left: in bed;with bed alarm set;with family/visitor present (pt declined to remain at EOB or in recliner this date) Nurse Communication: Mobility status PT Visit Diagnosis: Muscle weakness (generalized) (M62.81);Unsteadiness on feet (R26.81);Difficulty in walking, not elsewhere classified (R26.2);Pain Pain - Right/Left:  (midline) Pain - part of body:  (abdomen)    Time: 1761-6073 PT Time Calculation (min) (ACUTE ONLY): 27 min   Charges:   PT Evaluation $PT Eval Low Complexity: 1 Low  PT Treatments $Therapeutic Activity: 8-22 mins        Greggory Stallion, PT, DPT, GCS [phone]   Lindsey Thomas 02/20/2022, 10:25 AM

## 2022-02-20 NOTE — Discharge Summary (Signed)
Physician Discharge Summary   Patient: Lindsey Thomas MRN: [id number] DOB: 08-04-1940  Admit date:     02/17/2022  Discharge date: 02/20/22  Discharge Physician: Annita Brod   PCP: Idelle Crouch, MD   Recommendations at discharge:   Patient will follow-up with general surgery in 1 week.  At that time, determination can be made if drains can be removed either by general surgery or interventional radiology New medication: Cipro 500 p.o. twice daily x7 days New medication: Flagyl 500 p.o. twice daily x7 days Medication change: Lopressor changed to 25 mg p.o. 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  Patient seen by general surgery and IR consulted, who took patient for placement of by lateral CT-guided drains of the abdomen on 9/14.  Patient's hemoglobin dropped to 7.0 on 9/15 and she was transfused 1 unit packed red blood cells.  Since then, hemoglobin has remained stable.  Assessment and Plan: Sepsis ([place]) secondary to postprocedural  abdominal abscess Patient met sepsis criteria on admission with abdominal abscess, leukocytosis and heart rate greater than 90.  Treated with IV fluids and antibiotics.  Sepsis itself felt to be stabilized.  Status post drains by interventional radiology.  Patient initially responded well to broad-spectrum antibiotics.  Fluid cultures preliminary came back with abundant gram-positive cocci in clusters and moderate gram-negative rods.  Plan is to discharge patient on Cipro and Flagyl x7 days for total of 10 days of therapy.  If gram-positive cocci in clusters comes back as MRSA, can add Bactrim to his regimen.  Patient will follow-up with general surgery in 1 week.  At that time, abscess can be assessed to see if there is any residual and if not, when drains can be removed, either by general surgery or interventional radiology.  Pressure injury of skin Pressure Injury 02/17/22 Buttocks Medial;Right;Left;Upper Stage 2 -  Partial thickness loss of dermis presenting as a shallow open injury with a red, pink wound bed without slough. pink and red (Active)  02/17/22 2200  Location: Buttocks  Location Orientation: Medial;Right;Left;Upper  Staging: Stage 2 -  Partial thickness loss of dermis presenting as a shallow open injury with a red, pink wound bed without slough.  Wound Description (Comments): pink and red  Present on Admission: Yes   Stage II decubitus ulcer, present on admission.  Patient received wound care to be changed every 3 days, will continue upon discharge.   Anemia Anemia with a hemoglobin of 8 on admission, MCV of 86 and normal platelet count and a normal RDW.  Had dropped over the next few days and down to 7.0 today  so transfused 1 unit packed red blood cells.  Suspect combination of chronic disease, but more so from acute blood loss as patient had procedures done last month.  Hemoglobin remained stable ever since.  Hyponatremia-resolved as of 02/20/2022 Secondary to dehydration.  And by  day of discharge, up to 135.  GERD (gastroesophageal reflux disease) Will change IV PPI to p.o.   Abnormal thyroid function test Abnormal thyroid function studies including mildly elevated TSH and free T4.  Given acute illness, would not make a formal diagnosis and would recommend rechecking labs in 6 weeks.   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.0 to 39.9 Meets criteria for BMI greater than 30  Hypokalemia Replacing as needed         Consultants: General surgery Interventional radiology   Procedures: Placement bilateral CT guided drainage tubes of the abdomen Transfused 1 unit packed red blood cells 9/15  Disposition: Home Diet recommendation:  Discharge Diet Orders (From admission, onward)     Start     Ordered   02/20/22 0000  Diet general        02/20/22 1101           Cardiac diet DISCHARGE MEDICATION: Allergies as of 02/20/2022       Reactions   Eryc [erythromycin]    Levofloxacin Nausea Only   Percocet [oxycodone-acetaminophen] Nausea And Vomiting   Augmentin [amoxicillin-pot Clavulanate] Rash   Celecoxib Palpitations        Medication List     TAKE these medications    acetaminophen 500 MG tablet Commonly known as: TYLENOL Take 1,000 mg by mouth daily as needed for pain.   aspirin EC 81 MG tablet Take 81 mg by mouth daily.   Calcium Antacid 500 MG chewable tablet Generic drug: calcium carbonate Chew 2 tablets by mouth daily.   ciprofloxacin 500 MG tablet Commonly known as: Cipro Take 1 tablet (500 mg total) by mouth 2 (two) times daily for 7 days.   feeding supplement Liqd Take 237 mLs by mouth 3 (three) times daily between meals.   fluticasone 50 MCG/ACT nasal spray Commonly known as: FLONASE Place 2 sprays into both nostrils daily.   furosemide 20 MG tablet Commonly known as: LASIX Take 20 mg by mouth daily.   lidocaine 2 % solution Commonly known as: XYLOCAINE Use as directed 15 mLs in the mouth or throat every 8  (eight) hours as needed for mouth pain.   loperamide 2 MG capsule Commonly known as: IMODIUM Take 2 capsules (4 mg total) by mouth every 12 (twelve) hours.   magic mouthwash Soln Take 5 mLs by mouth 4 (four) times daily.   meloxicam 7.5 MG tablet Commonly known as: MOBIC Take 1 tablet by mouth daily.   metoprolol tartrate 25 MG tablet Commonly known as: LOPRESSOR Take 1 tablet (25 mg total) by mouth 2 (two) times daily.   metroNIDAZOLE 500 MG tablet Commonly known as: Flagyl Take 1 tablet (500 mg total) by mouth 2 (two) times daily for 7 days.   montelukast 10 MG tablet Commonly known as: SINGULAIR Take 10 mg by mouth at bedtime.   nystatin powder Commonly known as: MYCOSTATIN/NYSTOP Apply topically 3 (three) times daily.   omeprazole 20 MG capsule Commonly known as: PRILOSEC Take 20 mg by mouth 2 (two) times daily before a meal.   ondansetron 4 MG tablet Commonly known as: Zofran Take 1 tablet (4 mg total) by mouth daily as needed for nausea or vomiting.   PRESERVISION AREDS  PO Take 1 capsule by mouth 2 (two) times daily.   Symbicort 160-4.5 MCG/ACT inhaler Generic drug: budesonide-formoterol Inhale 2 puffs into the lungs 2 (two) times daily.   Systane 0.4-0.3 % Soln Generic drug: Polyethyl Glycol-Propyl Glycol Apply 1 drop to eye 2 (two) times daily.   traMADol 50 MG tablet Commonly known as: ULTRAM Take 1 tablet (50 mg total) by mouth every 6 (six) hours as needed for severe pain or moderate pain.   VITAMIN B-12 PO Take 1,000 mcg by mouth daily.   VITAMIN D PO Take 5,000 Units by mouth daily.               Discharge Care Instructions  (From admission, onward)           Start     Ordered   02/20/22 0000  Discharge wound care:       Comments: Clean abdominal wound with normal saline and pa dry.  Pack both ends with Aquacel (cut into strip) and cover with gauze/abdominal pad and tape.  Change daily.  Sacral foam to sacral pressure ulcer.   Change every 3 days or if soiled.   02/20/22 1101            Follow-up Information     Herbert Pun, MD. Schedule an appointment as soon as possible for a visit in 1 week(s).   Specialty: General Surgery Contact information: [address] [place] [address] [phone]                Discharge Exam: Danley Danker Weights   02/17/22 1930 02/19/22 0218  Weight: 78.5 kg 72.2 kg   General: Alert and oriented x3, no acute distress Cardiovascular: Regular rate and rhythm, S1-S2 Lungs: Clear to auscultation bilaterally  Condition at discharge: good  The results of significant diagnostics from this hospitalization (including imaging, microbiology, ancillary and laboratory) are listed below for reference.   Imaging Studies: CT GUIDED PERITONEAL/RETROPERITONEAL FLUID DRAIN BY PERC CATH  Result Date: 02/18/2022 INDICATION: 81 year old with recent abdominal surgeries including subtotal colectomy with an end ileostomy. Recent CT demonstrates intra-abdominal abscesses. Plan for CT-guided drainage of the intra-abdominal abscesses. EXAM: CT-guided drain placement in right lower abdominal/pelvic abscess CT-guided drain placement in left lower abdominal/pelvic abscess. MEDICATIONS: Moderate sedation ANESTHESIA/SEDATION: Moderate (conscious) sedation was employed during this procedure. A total of Versed 1.$RemoveBef'0mg'[id number]$  and fentanyl 75 mcg was administered intravenously at the order of the provider performing the procedure. Total intra-service moderate sedation time: 32 minutes. Patient's level of consciousness and vital signs were monitored continuously by radiology nurse throughout the procedure under the supervision of the provider performing the procedure. COMPLICATIONS: None immediate. PROCEDURE: Informed written consent was obtained from the patient after a thorough discussion of the procedural risks, benefits and alternatives. All questions were addressed. Maximal Sterile Barrier  Technique was utilized including caps, mask, sterile gowns, sterile gloves, sterile drape, hand hygiene and skin antiseptic. A timeout was performed prior to the initiation of the procedure. Patient was placed supine on the CT scanner. Images through the lower abdomen and pelvis were obtained. Abscess collections situated between the right psoas and right iliacus muscle targeted. The right side of the lower abdomen was prepped with chlorhexidine and sterile field was created. Skin was anesthetized with 1% lidocaine. A small incision was made. Using CT guidance, an 18 gauge trocar needle was directed into the collection adjacent to the right psoas muscle. Superstiff Amplatz wire was placed but the wire went into smaller collection anterior to the larger  abscess collection. As a result, this wire and needle were removed. A new incision was made in order to target the larger abscess collection. Using CT guidance, the 18 gauge needle was directed into the abscess and yellow purulent fluid was aspirated. Superstiff Amplatz wire was placed. Tract was dilated to accommodate a 10 [place] multipurpose drain and approximately 20 mL of purulent fluid was removed. Drain was attached to a suction bulb and sutured to skin. Attention was directed to the abscess in the left lower abdomen. Left lower abdomen was prepped with chlorhexidine and sterile field was created. Skin was anesthetized using 1% lidocaine. Small incision was made. Using CT guidance, an 18 gauge trocar needle was directed into the air-fluid collection and purulent fluid was aspirated. Superstiff Amplatz wire was placed and the tract was dilated to accommodate a 10 [place] multipurpose drain. 20 mL of thick yellow purulent fluid was removed. Follow up CT images were obtained. Drain was attached to a suction bulb and sutured to skin. Fluid samples were sent for culture. RADIATION DOSE REDUCTION: This exam was performed according to the departmental dose-optimization  program which includes automated exposure control, adjustment of the mA and/or kV according to patient size and/or use of iterative reconstruction technique. FINDINGS: Multiple air-fluid collections in the right lower abdomen. Largest abscess was situated between the right psoas muscle and right iliacus muscle. Purulent fluid was removed from the largest collection. Drain was placed in the largest abscess collection. Drain was successfully placed in the air-fluid collection in the left lower abdomen adjacent to the left psoas muscle. 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 IMPRESSION: Successful placement of a CT-guided drain in the left lower abdominal abscess and CT-guided placement of a drain in the right lower abdominal abscess. Electronically Signed   By: Markus Daft M.D.   On: 02/18/2022 21:23   CT GUIDED PERITONEAL/RETROPERITONEAL FLUID DRAIN BY PERC CATH  Result Date: 02/18/2022 INDICATION: 81 year old with recent abdominal surgeries including subtotal colectomy with an end ileostomy. Recent CT demonstrates intra-abdominal abscesses. Plan for CT-guided drainage of the intra-abdominal abscesses. EXAM: CT-guided drain placement in right lower abdominal/pelvic abscess CT-guided drain placement in left lower abdominal/pelvic abscess. MEDICATIONS: Moderate sedation ANESTHESIA/SEDATION: Moderate (conscious) sedation was employed during this procedure. A total of Versed 1.$RemoveBef'0mg'[id number]$  and fentanyl 75 mcg was administered intravenously at the order of the provider performing the procedure. Total intra-service moderate sedation time: 32 minutes. Patient's level of consciousness and vital signs were monitored continuously by radiology nurse throughout the procedure under the supervision of the provider performing the procedure.  COMPLICATIONS: None immediate. PROCEDURE: Informed written consent was obtained from the patient after a thorough discussion of the procedural risks, benefits and alternatives. All questions were addressed. Maximal Sterile Barrier Technique was utilized including caps, mask, sterile gowns, sterile gloves, sterile drape, hand hygiene and skin antiseptic. A timeout was performed prior to the initiation of the procedure. Patient was placed supine on the CT scanner. Images through the lower abdomen and pelvis were obtained. Abscess collections situated between the right psoas and right iliacus muscle targeted. The right side of the lower abdomen was prepped with chlorhexidine and sterile field was created. Skin was anesthetized with 1% lidocaine. A small incision was made. Using CT guidance, an 18 gauge trocar needle was directed into the collection adjacent to the right psoas muscle. Superstiff  Amplatz wire was placed but the wire went into smaller collection anterior to the larger abscess collection. As a result, this wire and needle were removed. A new incision was made in order to target the larger abscess collection. Using CT guidance, the 18 gauge needle was directed into the abscess and yellow purulent fluid was aspirated. Superstiff Amplatz wire was placed. Tract was dilated to accommodate a 10 [place] multipurpose drain and approximately 20 mL of purulent fluid was removed. Drain was attached to a suction bulb and sutured to skin. Attention was directed to the abscess in the left lower abdomen. Left lower abdomen was prepped with chlorhexidine and sterile field was created. Skin was anesthetized using 1% lidocaine. Small incision was made. Using CT guidance, an 18 gauge trocar needle was directed into the air-fluid collection and purulent fluid was aspirated. Superstiff Amplatz wire was placed and the tract was dilated to accommodate a 10 [place] multipurpose drain. 20 mL of thick yellow purulent fluid was  removed. Follow up CT images were obtained. Drain was attached to a suction bulb and sutured to skin. Fluid samples were sent for culture. RADIATION DOSE REDUCTION: This exam was performed according to the departmental dose-optimization program which includes automated exposure control, adjustment of the mA and/or kV according to patient size and/or use of iterative reconstruction technique. FINDINGS: Multiple air-fluid collections in the right lower abdomen. Largest abscess was situated between the right psoas muscle and right iliacus muscle. Purulent fluid was removed from the largest collection. Drain was placed in the largest abscess collection. Drain was successfully placed in the air-fluid collection in the left lower abdomen adjacent to the left psoas muscle. 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 IMPRESSION: Successful placement of a CT-guided drain in the left lower abdominal abscess and CT-guided placement of a drain in the right lower abdominal abscess. Electronically Signed   By: Markus Daft M.D.   On: 02/18/2022 21:23   CT Abdomen Pelvis W Contrast  Result Date: 02/17/2022 CLINICAL DATA:  Sigmoid hemicolectomy 01/20/2022. Repair of cecal perforation. Loop ileostomy creation. Drains removed last week. Abdominal pain. Elevated white blood cell count. EXAM: CT ABDOMEN AND PELVIS WITH CONTRAST TECHNIQUE: Multidetector CT imaging of the abdomen and pelvis was performed using the standard protocol following bolus administration of intravenous contrast. RADIATION DOSE REDUCTION: This exam was performed according to the departmental dose-optimization program which includes automated exposure control, adjustment of the mA and/or kV according to patient size and/or use of iterative reconstruction technique. CONTRAST:  130mL  OMNIPAQUE IOHEXOL 300 MG/ML  SOLN COMPARISON:  02/01/2022 FINDINGS: Lower chest: Motion degradation within the lower chest. Tiny bibasilar pulmonary nodules are most likely incidental/benign and do not warrant imaging follow-up per consensus criteria. Example at maximally 3 mm.Mild cardiomegaly, without pericardial or pleural effusion. Hepatobiliary: Mild motion degradation continuing into the upper abdomen. Normal liver. The gallbladder is underdistended, without calcified stone or specific evidence of acute cholecystitis. Pancreas: Normal, without mass or ductal dilatation. Spleen: Normal in size, without focal abnormality. Adrenals/Urinary Tract: Normal adrenal glands. Normal kidneys, without hydronephrosis. Normal urinary bladder. Stomach/Bowel: Normal stomach, without wall thickening. Interval Hartmann's pouch creation and new complete colectomy. Right lower quadrant ileostomy again identified. Ill-defined right pelvic fluid and gas of 6.0 x 5.1 cm on 57/2 is contiguous with the rectal stump, new since 01/24/2022. ill-defined left pelvic  fluid and gas collection measures 6.7 x 4.3 cm on 57/2, also likely tracking from the rectal stump including on 63/2. Extends to the left adnexa and superior aspect of the left side of the vaginal cuff including on 66/2. More ill-defined gas within the ileocolic mesentery including on 50/2. Vascular/Lymphatic: Aortic atherosclerosis. 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 No abdominopelvic adenopathy. Reproductive: Hysterectomy.  No adnexal mass. Other: No free pelvic fluid. Pelvic midline laparotomy included on 66/2. Hyperattenuation material within could represent contrast. Gas just deep to the laparotomy site on 63/2 is intimately associated with the adjacent bowel loops. No well-defined fluid collection. There is also midline abdominal wall presumably laparotomy induced gas on 49/2. Musculoskeletal: No acute osseous abnormality. Grade 1 L3-4 anterolisthesis with advanced  lumbosacral spondylosis. IMPRESSION: 1. Minimally motion degraded exam. 2. Interval near complete colectomy with rectal stump creation. Bilateral ill-defined gas and fluid collections within the pelvis, most consistent with abscesses. These track from the rectal stump site, and anastomotic leak cannot be excluded. 3. Abdominopelvic laparotomy sites. Gas deep to pelvic surgical sutures is intimately associated with adjacent bowel and fistulous communication cannot be excluded (especially given hyperattenuating material within the defect, possibly contrast). 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 Electronically Signed   By: Abigail Miyamoto M.D.   On: 02/17/2022 18:08   DG Chest 2 View  Result Date: 02/17/2022 CLINICAL DATA:  Suspected sepsis EXAM: CHEST - 2 VIEW COMPARISON:  11/29/2020 FINDINGS: Linear atelectasis in the right upper lobe. No confluent airspace opacities otherwise. Heart is normal size. Mediastinal contours within normal limits. Aortic atherosclerosis. No effusions or acute bony abnormality. IMPRESSION: Platelike right upper lobe atelectasis. No active disease. Electronically Signed   By: Rolm Baptise M.D.   On: 02/17/2022 17:09   [place] OR NERVE BLOCK-IMAGE ONLY [facility])  Result Date: 01/24/2022 There is no interpretation for this exam.  This order is for images obtained during a surgical procedure.  Please See "Surgeries" Tab for more information regarding the procedure.   CT ABDOMEN PELVIS W CONTRAST  Result Date: 01/24/2022 CLINICAL DATA:  Postop abdominal pain. Increased white blood cell. Rule out abscess. Surgery 01/20/2022 with sigmoid hemicolectomy, repair cecal perforation in transverse serosal tear and loop ileostomy creation. EXAM: CT ABDOMEN AND PELVIS WITH CONTRAST TECHNIQUE: Multidetector CT imaging of the abdomen and pelvis was performed using the standard protocol following bolus administration of intravenous contrast. RADIATION DOSE REDUCTION: This exam was performed according to the  departmental dose-optimization program which includes automated exposure control, adjustment of the mA and/or kV according to patient size and/or use of iterative reconstruction technique. CONTRAST:  181mL OMNIPAQUE IOHEXOL 300 MG/ML  SOLN COMPARISON:  Preoperative CT 01/18/2022, CT 01/13/2022 FINDINGS: Lower chest: Trace pleural thickening without significant effusion. No basilar airspace disease. Hepatobiliary: Possible mild hepatic steatosis. No focal liver lesion. No biliary dilatation. Mild gallbladder distension but no calcified gallstone or pericholecystic fat stranding. Pancreas: No ductal dilatation or inflammation. Spleen: Normal in size. Two small linear subcapsular regions in the spleen, 8 mm superiorly series 2, image 20, 13 mm laterally series 2, image 23. These are new from prior exam. There is no adjacent perisplenic fluid or subcapsular hematoma. Adrenals/Urinary Tract: Normal adrenal glands. No hydronephrosis. No focal renal abnormality. Decompressed urinary bladder, not well assessed. Stomach/Bowel: Fluid distended stomach. No gastric wall thickening. Fluid-filled small bowel particularly proximally. There is moderate small bowel wall thickening in the mid abdomen. Mild diffuse small bowel wall enhancement. Interval right lower quadrant loop ileostomy. Decreased colonic  distension from preoperative imaging. There is wall thickening of the descending and sigmoid colon. Surgery in the mid sigmoid colon, series 2, image 70. There is mild air adjacent to the suture line posteriorly tracking inferior and superiorly, for example series 2 images 67 through 73 and adjacent. Moderate volume of free air is also seen scattered throughout the anterior abdomen with small volume of free air in the mesentery. There is associated free fluid adjacent to this free air, as well as moderate volume of free fluid throughout the abdomen and pelvis. Vascular/Lymphatic: Aortic atherosclerosis. Patent portal vein. There is  mild generalized mesenteric edema. Limited assessment for adenopathy. Reproductive: Status post hysterectomy. No adnexal masses. Other: Recent midline laparotomy. There is scattered free air, greatest in the mid anterior abdomen, also in the right upper quadrant, scattered throughout the mesentery and in the left pelvis. Moderate volume of free fluid in the lower abdomen and pelvis. Free fluid is mildly complicated in density. Short TE this appears to be non loculated, however there may be developing peripheral enhancement involving fluid in the right pelvis with possible developing collection measuring 6.7 x 3.4 x 11.1 cm series 2, image 67 and series 5, image 69. Small amount of perihepatic fluid, non organized. Diffuse subcutaneous edema of the anterior abdominal wall. No subcutaneous collection. Musculoskeletal: Stable osseous structures. Degenerative change in the spine and hips. IMPRESSION: 1. Recent midline laparotomy with right lower quadrant loop ileostomy. 2. There is a moderate volume of free air in the abdomen. There is air adjacent to the sigmoid suture line, but also throughout the anterior abdomen, and scattered in the mesentery. It is unclear if this represents residual postsurgical air or anastomotic leak. 3. Moderate volume of free fluid in the lower abdomen and pelvis. Majority of this fluid is not loculated, however is mildly complex in density. There may be developing peripheral enhancement involving fluid in the right pelvis with possible developing collection measuring 6.7 x 3.4 x 11.1 cm. 4. Two small linear subcapsular regions in the spleen, new from prior exam, suspicious for small splenic infarcts. No adjacent perisplenic fluid or subcapsular hematoma. 5. Diffuse small bowel enhancement, prominent fluid-filled proximal small bowel and distended stomach. Favor postoperative ileus, although the possibility of enteritis is also considered. Aortic Atherosclerosis (ICD10-I70.0). Electronically  Signed   By: Keith Rake M.D.   On: 01/24/2022 16:34    Microbiology: Results for orders placed or performed during the hospital encounter of 02/17/22  Culture, blood (Routine x 2)     Status: None (Preliminary result)   Collection Time: 02/17/22  4:30 PM   Specimen: BLOOD  Result Value Ref Range Status   Specimen Description BLOOD FOOT  Final   Special Requests   Final    BOTTLES DRAWN AEROBIC AND ANAEROBIC Blood Culture results may not be optimal due to an inadequate volume of blood received in culture bottles   Culture   Final    NO GROWTH 3 DAYS Performed at [facility], [address], [place], [place] [address]    Report Status PENDING  Incomplete  Culture, blood (Routine x 2)     Status: None (Preliminary result)   Collection Time: 02/17/22  9:40 PM   Specimen: BLOOD  Result Value Ref Range Status   Specimen Description BLOOD BLOOD LEFT FOREARM  Final   Special Requests IN PEDIATRIC BOTTLE Blood Culture adequate volume  Final   Culture   Final    NO GROWTH 3 DAYS Performed at [facility],  [place], [place] [address]    Report Status PENDING  Incomplete  Aerobic/Anaerobic Culture w Gram Stain (surgical/deep wound)     Status: None (Preliminary result)   Collection Time: 02/18/22  4:42 PM   Specimen: Abscess  Result Value Ref Range Status   Specimen Description   Final    ABSCESS Performed at [facility], [address], [place], [place] [address]    Special Requests   Final    NONE Performed at [facility], [address] [place], [place] [address]    Gram Stain   Final    ABUNDANT WBC PRESENT, PREDOMINANTLY MONONUCLEAR MODERATE GRAM POSITIVE COCCI IN PAIRS AND CHAINS MODERATE GRAM NEGATIVE RODS    Culture   Final    ABUNDANT ENTEROCOCCUS FAECALIS FEW PSEUDOMONAS AERUGINOSA ABUNDANT STREPTOCOCCUS ANGINOSIS CULTURE REINCUBATED FOR BETTER GROWTH Performed at [facility], [place] [address]  [place], [place] [address]    Report Status PENDING  Incomplete  Aerobic/Anaerobic Culture w Gram Stain (surgical/deep wound)     Status: None (Preliminary result)   Collection Time: 02/18/22  4:42 PM   Specimen: Abscess  Result Value Ref Range Status   Specimen Description   Final    ABSCESS Performed at [facility], [address] [place], [place] [address]    Special Requests   Final    NONE Performed at [facility], [address] [place], [place] [address]    Gram Stain   Final    ABUNDANT WBC PRESENT, PREDOMINANTLY MONONUCLEAR ABUNDANT GRAM POSITIVE COCCI IN CLUSTERS MODERATE GRAM NEGATIVE RODS FEW GRAM POSITIVE COCCI IN PAIRS AND CHAINS    Culture   Final    FEW PSEUDOMONAS AERUGINOSA ABUNDANT STREPTOCOCCUS ANGINOSIS SUSCEPTIBILITIES TO FOLLOW HOLDING FOR POSSIBLE ANAEROBE Performed at [facility], [place] [address], [place], [place] [address]    Report Status PENDING  Incomplete    Labs: CBC: Recent Labs  Lab 02/17/22 1630 02/18/22 1140 02/19/22 0509 02/19/22 1954 02/20/22 0545  WBC 15.0* 12.4* 10.7*  --  10.8*  NEUTROABS 12.6*  --   --   --   --   HGB 8.0* 7.7* 7.0* 8.4* 8.6*  HCT 24.8* 24.0* 22.1*  --  25.6*  MCV 86.7 87.0 87.7  --  84.5  PLT 279 273 240  --  659   Basic Metabolic Panel: Recent Labs  Lab 02/17/22 1630 02/18/22 0545 02/18/22 1140 02/19/22 0509 02/20/22 0545  NA 129*  --  134* 134* 135  K 3.5  --  3.3* 3.2* 3.1*  CL 99  --  101 107 110  CO2 20*  --  23 21* 19*  GLUCOSE 138*  --  91 104* 119*  BUN 9  --  $R'8 9 10  'tA$ CREATININE 0.52 0.51 0.49 0.47 0.35*  CALCIUM 7.8*  --  7.9* 7.7* 8.0*   Liver Function Tests: Recent Labs  Lab 02/17/22 1630  AST 12*  ALT 9  ALKPHOS 77  BILITOT 0.3  PROT 5.4*  ALBUMIN 2.0*   CBG: No results for input(s): "GLUCAP" in the last 168 hours.  Discharge time spent: less than 30 minutes.  Signed: Annita Brod, MD [facility] 02/20/2022

## 2022-02-20 NOTE — TOC Transition Note (Addendum)
Transition of Care [facility]) - CM/SW Discharge Note   Patient Details  Name: Lindsey Thomas MRN: [id number] Date of Birth: March 21, 1941  Transition of Care [facility] CM/SW Contact:  [address], [place] Phone Number: 02/20/2022, 11:22 AM   Clinical Narrative:     CSW spoke with pt concerning MD recommending HH/PT/PT/RN, pt agreeable, chose [facility]. Pt confirms PCP is [facility]. CSW spoke with [place] at [facility], they accepted pt from [facility] on 02/16/22.    Barriers to Discharge: Continued Medical Work up   Patient Goals and CMS Choice Patient states their goals for this hospitalization and ongoing recovery are:: to go home CMS Medicare.gov Compare Post Acute Care list provided to:: Patient Choice offered to / list presented to : Patient  Discharge Placement                       Discharge Plan and Services                                     Social Determinants of Health (SDOH) Interventions     Readmission Risk Interventions     No data to display

## 2022-02-20 NOTE — Progress Notes (Signed)
Laureen Abrahams to be D/C'd Home with [facility] per MD order.  Discussed prescriptions and follow up appointments with the patient and daughter [name]. Prescriptions given to patient, medication list explained in detail. Pt verbalized understanding.  Allergies as of 02/20/2022       Reactions   Eryc [erythromycin]    Levofloxacin Nausea Only   Percocet [oxycodone-acetaminophen] Nausea And Vomiting   Augmentin [amoxicillin-pot Clavulanate] Rash   Celecoxib Palpitations        Medication List     TAKE these medications    acetaminophen 500 MG tablet Commonly known as: TYLENOL Take 1,000 mg by mouth daily as needed for pain.   aspirin EC 81 MG tablet Take 81 mg by mouth daily.   Calcium Antacid 500 MG chewable tablet Generic drug: calcium carbonate Chew 2 tablets by mouth daily.   ciprofloxacin 500 MG tablet Commonly known as: Cipro Take 1 tablet (500 mg total) by mouth 2 (two) times daily for 7 days.   feeding supplement Liqd Take 237 mLs by mouth 3 (three) times daily between meals.   fluticasone 50 MCG/ACT nasal spray Commonly known as: FLONASE Place 2 sprays into both nostrils daily.   furosemide 20 MG tablet Commonly known as: LASIX Take 20 mg by mouth daily.   lidocaine 2 % solution Commonly known as: XYLOCAINE Use as directed 15 mLs in the mouth or throat every 8 (eight) hours as needed for mouth pain.   loperamide 2 MG capsule Commonly known as: IMODIUM Take 2 capsules (4 mg total) by mouth every 12 (twelve) hours.   magic mouthwash Soln Take 5 mLs by mouth 4 (four) times daily.   meloxicam 7.5 MG tablet Commonly known as: MOBIC Take 1 tablet by mouth daily.   metoprolol tartrate 25 MG tablet Commonly known as: LOPRESSOR Take 1 tablet (25 mg total) by mouth 2 (two) times daily.   metroNIDAZOLE 500 MG tablet Commonly known as: Flagyl Take 1 tablet (500 mg total) by mouth 2 (two) times daily for 7 days.   montelukast 10 MG tablet Commonly known as:  SINGULAIR Take 10 mg by mouth at bedtime.   nystatin powder Commonly known as: MYCOSTATIN/NYSTOP Apply topically 3 (three) times daily.   omeprazole 20 MG capsule Commonly known as: PRILOSEC Take 20 mg by mouth 2 (two) times daily before a meal.   ondansetron 4 MG tablet Commonly known as: Zofran Take 1 tablet (4 mg total) by mouth daily as needed for nausea or vomiting.   PRESERVISION AREDS PO Take 1 capsule by mouth 2 (two) times daily.   Symbicort 160-4.5 MCG/ACT inhaler Generic drug: budesonide-formoterol Inhale 2 puffs into the lungs 2 (two) times daily.   Systane 0.4-0.3 % Soln Generic drug: Polyethyl Glycol-Propyl Glycol Apply 1 drop to eye 2 (two) times daily.   traMADol 50 MG tablet Commonly known as: ULTRAM Take 1 tablet (50 mg total) by mouth every 6 (six) hours as needed for severe pain or moderate pain.   VITAMIN B-12 PO Take 1,000 mcg by mouth daily.   VITAMIN D PO Take 5,000 Units by mouth daily.               Discharge Care Instructions  (From admission, onward)           Start     Ordered   02/20/22 0000  Discharge wound care:       Comments: Clean abdominal wound with normal saline and pa dry.  Pack both ends with Aquacel (cut  into strip) and cover with gauze/abdominal pad and tape.  Change daily.  Sacral foam to sacral pressure ulcer.  Change every 3 days or if soiled.   02/20/22 1101            Vitals:   02/20/22 0437 02/20/22 0739  BP: (!) 115/53 (!) 111/46  Pulse: 79 84  Resp: 20 18  Temp: 98.1 F (36.7 C) 98.1 F (36.7 C)  SpO2: 98% 99%    Tele box removed andr returned. Skin clean, dry and intact without evidence of skin break down, no evidence of skin tears noted. IV catheter discontinued intact. Site without signs and symptoms of complications. Dressing and pressure applied. Pt denies pain at this time. No complaints noted.  An After Visit Summary was printed and given to the patient. Patient escorted via [place], and  D/C home via private auto.  Rolley Sims

## 2022-02-22 DIAGNOSIS — M170 Bilateral primary osteoarthritis of knee: Secondary | ICD-10-CM | POA: Diagnosis not present

## 2022-02-22 DIAGNOSIS — D649 Anemia, unspecified: Secondary | ICD-10-CM | POA: Diagnosis not present

## 2022-02-22 DIAGNOSIS — I890 Lymphedema, not elsewhere classified: Secondary | ICD-10-CM | POA: Diagnosis not present

## 2022-02-22 DIAGNOSIS — A419 Sepsis, unspecified organism: Secondary | ICD-10-CM | POA: Diagnosis not present

## 2022-02-22 DIAGNOSIS — T8143XA Infection following a procedure, organ and space surgical site, initial encounter: Secondary | ICD-10-CM | POA: Diagnosis not present

## 2022-02-22 DIAGNOSIS — J439 Emphysema, unspecified: Secondary | ICD-10-CM | POA: Diagnosis not present

## 2022-02-22 DIAGNOSIS — G43909 Migraine, unspecified, not intractable, without status migrainosus: Secondary | ICD-10-CM | POA: Diagnosis not present

## 2022-02-22 DIAGNOSIS — G4733 Obstructive sleep apnea (adult) (pediatric): Secondary | ICD-10-CM | POA: Diagnosis not present

## 2022-02-22 DIAGNOSIS — J45909 Unspecified asthma, uncomplicated: Secondary | ICD-10-CM | POA: Diagnosis not present

## 2022-02-22 LAB — AEROBIC/ANAEROBIC CULTURE W GRAM STAIN (SURGICAL/DEEP WOUND)

## 2022-02-22 LAB — CULTURE, BLOOD (ROUTINE X 2)
Culture: NO GROWTH
Culture: NO GROWTH
Special Requests: ADEQUATE

## 2022-02-23 DIAGNOSIS — K5732 Diverticulitis of large intestine without perforation or abscess without bleeding: Secondary | ICD-10-CM | POA: Diagnosis not present

## 2022-02-23 DIAGNOSIS — Z932 Ileostomy status: Secondary | ICD-10-CM | POA: Diagnosis not present

## 2022-02-23 DIAGNOSIS — K631 Perforation of intestine (nontraumatic): Secondary | ICD-10-CM | POA: Diagnosis not present

## 2022-02-23 DIAGNOSIS — K56609 Unspecified intestinal obstruction, unspecified as to partial versus complete obstruction: Secondary | ICD-10-CM | POA: Diagnosis not present

## 2022-02-25 ENCOUNTER — Other Ambulatory Visit: Payer: Self-pay | Admitting: Specialist

## 2022-02-25 ENCOUNTER — Other Ambulatory Visit: Payer: Self-pay | Admitting: General Surgery

## 2022-02-25 DIAGNOSIS — L0291 Cutaneous abscess, unspecified: Secondary | ICD-10-CM

## 2022-02-25 DIAGNOSIS — K651 Peritoneal abscess: Secondary | ICD-10-CM

## 2022-03-02 DIAGNOSIS — E7800 Pure hypercholesterolemia, unspecified: Secondary | ICD-10-CM | POA: Diagnosis not present

## 2022-03-02 DIAGNOSIS — M810 Age-related osteoporosis without current pathological fracture: Secondary | ICD-10-CM | POA: Diagnosis not present

## 2022-03-02 DIAGNOSIS — Z0000 Encounter for general adult medical examination without abnormal findings: Secondary | ICD-10-CM | POA: Diagnosis not present

## 2022-03-02 DIAGNOSIS — R053 Chronic cough: Secondary | ICD-10-CM | POA: Diagnosis not present

## 2022-03-02 DIAGNOSIS — Z79899 Other long term (current) drug therapy: Secondary | ICD-10-CM | POA: Diagnosis not present

## 2022-03-02 DIAGNOSIS — Z23 Encounter for immunization: Secondary | ICD-10-CM | POA: Diagnosis not present

## 2022-03-02 DIAGNOSIS — I890 Lymphedema, not elsewhere classified: Secondary | ICD-10-CM | POA: Diagnosis not present

## 2022-03-02 DIAGNOSIS — R059 Cough, unspecified: Secondary | ICD-10-CM | POA: Diagnosis not present

## 2022-03-02 DIAGNOSIS — E041 Nontoxic single thyroid nodule: Secondary | ICD-10-CM | POA: Diagnosis not present

## 2022-03-04 ENCOUNTER — Other Ambulatory Visit: Payer: Self-pay | Admitting: General Surgery

## 2022-03-04 ENCOUNTER — Other Ambulatory Visit (HOSPITAL_COMMUNITY): Payer: Self-pay | Admitting: General Surgery

## 2022-03-04 ENCOUNTER — Ambulatory Visit
Admission: RE | Admit: 2022-03-04 | Discharge: 2022-03-04 | Disposition: A | Discharge status: Home / Self Care | Payer: Medicare HMO | Source: Ambulatory Visit | Attending: General Surgery | Admitting: General Surgery

## 2022-03-04 ENCOUNTER — Other Ambulatory Visit: Payer: Self-pay | Admitting: Radiation Oncology

## 2022-03-04 ENCOUNTER — Ambulatory Visit
Admission: RE | Admit: 2022-03-04 | Discharge: 2022-03-04 | Disposition: A | Discharge status: Home / Self Care | Payer: Medicare HMO | Source: Ambulatory Visit | Attending: Radiation Oncology | Admitting: Radiation Oncology

## 2022-03-04 ENCOUNTER — Other Ambulatory Visit: Payer: Medicare HMO

## 2022-03-04 DIAGNOSIS — I82712 Chronic embolism and thrombosis of superficial veins of left upper extremity: Secondary | ICD-10-CM | POA: Diagnosis not present

## 2022-03-04 DIAGNOSIS — K651 Peritoneal abscess: Secondary | ICD-10-CM

## 2022-03-04 DIAGNOSIS — M7989 Other specified soft tissue disorders: Secondary | ICD-10-CM | POA: Diagnosis not present

## 2022-03-04 DIAGNOSIS — K5732 Diverticulitis of large intestine without perforation or abscess without bleeding: Secondary | ICD-10-CM | POA: Diagnosis not present

## 2022-03-04 DIAGNOSIS — Z932 Ileostomy status: Secondary | ICD-10-CM | POA: Diagnosis not present

## 2022-03-04 DIAGNOSIS — K56609 Unspecified intestinal obstruction, unspecified as to partial versus complete obstruction: Secondary | ICD-10-CM | POA: Diagnosis not present

## 2022-03-04 DIAGNOSIS — K631 Perforation of intestine (nontraumatic): Secondary | ICD-10-CM | POA: Diagnosis not present

## 2022-03-04 DIAGNOSIS — Z4803 Encounter for change or removal of drains: Secondary | ICD-10-CM | POA: Diagnosis not present

## 2022-03-04 HISTORY — PX: IR RADIOLOGIST EVAL & MGMT: IMG5224

## 2022-03-04 MED ORDER — IOPAMIDOL (ISOVUE-300) INJECTION 61%
100.0000 mL | Freq: Once | INTRAVENOUS | Status: AC | PRN
  Administered 2022-03-04: 100 mL via INTRAVENOUS

## 2022-03-04 NOTE — Progress Notes (Signed)
Referring Physician(s): Cintron-Diaz,Edgardo  Chief Complaint: The patient is seen in follow up today for drain follow up  History of present illness: 81 year old Lindsey Thomas with history of sigmoid colon stricture and small bowel obstruction status post colectomy and end ileostomy creation complicated post-operatively by bilateral lower abdominal abscess formation status post CT guided drain placements on 02/18/22 by Dr. Anselm Pancoast.  She reports trace output from the drains since discharge. She continues to flush TID.  No new fevers or chills.  She does complain of bilateral lower extremity swelling and aching which is new since discharge.  No chest pain or shortness of breath.  Past Medical History:  Diagnosis Date   Anxiety    Arthritis    Asthma    DVT (deep venous thrombosis) (HCC)    Edema    feet   Emphysema of lung (HCC)    GERD (gastroesophageal reflux disease)    Headache    MIGRAINES   History of orthopnea    Hypercholesterolemia    Hypothyroidism    NODULES   Lymphedema    Migraine    Non-small cell lung cancer, right ([place]) 05/2019   Rad tx's   Shortness of breath dyspnea    WITH EXERTION   Sleep apnea    MILD, DOES NOT USE CPAP   Thyroid nodule     Past Surgical History:  Procedure Laterality Date   ABDOMINAL HYSTERECTOMY     BREAST BIOPSY     CARDIAC CATHETERIZATION     CATARACT EXTRACTION W/PHACO Left 02/12/2016   Procedure: CATARACT EXTRACTION PHACO AND INTRAOCULAR LENS PLACEMENT ([place]);  Surgeon: Eulogio Bear, MD;  Location: [facility] ORS;  Service: Ophthalmology;  Laterality: Left;  [place] 01:30 AP% 15.2 CDE 13.71 Fluid pack lot # [id number]   CATARACT EXTRACTION W/PHACO Right 03/18/2016   Procedure: CATARACT EXTRACTION PHACO AND INTRAOCULAR LENS PLACEMENT ([facility]);  Surgeon: Eulogio Bear, MD;  Location: [facility] ORS;  Service: Ophthalmology;  Laterality: Right;  Lot # [id number] H [place]: 01:05.5 AP%:14.3 CDE: 9.33   COLECTOMY WITH COLOSTOMY CREATION/HARTMANN PROCEDURE N/A  01/20/2022   Procedure: COLECTOMY WITH COLOSTOMY CREATION/HARTMANN PROCEDURE;  Surgeon: Herbert Pun, MD;  Location: [facility] ORS;  Service: General;  Laterality: N/A;   FLEXIBLE SIGMOIDOSCOPY N/A 01/14/2022   Procedure: FLEXIBLE SIGMOIDOSCOPY;  Surgeon: Toledo, Benay Pike, MD;  Location: [facility] END[facility]COPY;  Service: Gastroenterology;  Laterality: N/A;   FLEXIBLE SIGMOIDOSCOPY N/A 01/19/2022   Procedure: FLEXIBLE SIGMOIDOSCOPY;  Surgeon: Lesly Rubenstein, MD;  Location: [facility] ENDOSCOPY;  Service: Endoscopy;  Laterality: N/A;   FRACTURE SURGERY Left    arm rod and screw   KNEE ARTHROSCOPY     LAPAROTOMY N/A 01/24/2022   Procedure: EXPLORATORY LAPAROTOMY;  Surgeon: Herbert Pun, MD;  Location: [facility] ORS;  Service: General;  Laterality: N/A;   OOPHORECTOMY     RCR     ROTATOR CUFF REPAIR Left 2006    Allergies: Eryc [erythromycin], Levofloxacin, Percocet [oxycodone-acetaminophen], Augmentin [amoxicillin-pot clavulanate], and Celecoxib  Medications: Prior to Admission medications   Medication Sig Start Date End Date Taking? Authorizing Provider  acetaminophen (TYLENOL) 500 MG tablet Take 1,000 mg by mouth daily as needed for pain.    [provider]  aspirin EC 81 MG tablet Take 81 mg by mouth daily.    [provider]  calcium carbonate (CALCIUM ANTACID) 500 MG chewable tablet Chew 2 tablets by mouth daily.    [provider]  Cholecalciferol (VITAMIN D PO) Take 5,000 Units by mouth daily.  [provider]  Cyanocobalamin (VITAMIN B-12 PO) Take 1,000 mcg by mouth daily.    [provider]  feeding supplement (ENSURE ENLIVE / ENSURE PLUS) LIQD Take 237 mLs by mouth 3 (three) times daily between meals. 01/30/22   [name], MD  fluticasone (FLONASE) 50 MCG/ACT nasal spray Place 2 sprays into both nostrils daily. 07/26/18   [provider]  furosemide (LASIX) 20 MG tablet Take 20 mg by mouth daily. 10/10/17   [provider]   lidocaine (XYLOCAINE) 2 % solution Use as directed 15 mLs in the mouth or throat every 8 (eight) hours as needed for mouth pain. 01/30/22   [name], MD  loperamide (IMODIUM) 2 MG capsule Take 2 capsules (4 mg total) by mouth every 12 (twelve) hours. 01/30/22   [name], MD  magic mouthwash SOLN Take 5 mLs by mouth 4 (four) times daily. 01/30/22   [name], MD  meloxicam (MOBIC) 7.5 MG tablet Take 1 tablet by mouth daily. 10/15/21   [provider]  metoprolol tartrate (LOPRESSOR) 25 MG tablet Take 1 tablet (25 mg total) by mouth 2 (two) times daily. 02/20/22   Annita Brod, MD  montelukast (SINGULAIR) 10 MG tablet Take 10 mg by mouth at bedtime.     [provider]  Multiple Vitamins-Minerals (PRESERVISION AREDS PO) Take 1 capsule by mouth 2 (two) times daily.    [provider]  nystatin (MYCOSTATIN/NYSTOP) powder Apply topically 3 (three) times daily. 01/30/22   [name], MD  omeprazole (PRILOSEC) 20 MG capsule Take 20 mg by mouth 2 (two) times daily before a meal.    [provider]  ondansetron (ZOFRAN) 4 MG tablet Take 1 tablet (4 mg total) by mouth daily as needed for nausea or vomiting. 01/15/22 01/15/23  Enzo Bi, MD  Polyethyl Glycol-Propyl Glycol (SYSTANE) 0.4-0.3 % SOLN Apply 1 drop to eye 2 (two) times daily.    [provider]  SYMBICORT 160-4.5 MCG/ACT inhaler Inhale 2 puffs into the lungs 2 (two) times daily. 06/19/21   [provider]  traMADol (ULTRAM) 50 MG tablet Take 1 tablet (50 mg total) by mouth every 6 (six) hours as needed for severe pain or moderate pain. 01/30/22   [name], MD     Family History  Problem Relation Age of Onset   Emphysema Mother    Heart Problems Mother    Tuberculosis Father    Brain cancer Father    Skin cancer Father    Breast cancer Sister    Irritable bowel syndrome Sister    Lung cancer Brother    Dementia Sister    Schizophrenia Sister     Social History    Socioeconomic History   Marital status: Married    Spouse name: Not on file   Number of children: Not on file   Years of education: Not on file   Highest education level: Not on file  Occupational History   Not on file  Tobacco Use   Smoking status: Never   Smokeless tobacco: Never  Substance and Sexual Activity   Alcohol use: No   Drug use: Never   Sexual activity: Not on file  Other Topics Concern   Not on file  Social History Narrative   Not on file   Social Determinants of Health   Financial Resource Strain: Not on file  Food Insecurity: Not on file  Transportation Needs: Not on file  Physical Activity: Not on file  Stress: Not on  file  Social Connections: Not on file     Vital Signs: There were no vitals taken for this visit.  Physical Exam Constitutional:      General: She is not in acute distress. HENT:     Head: Normocephalic.     Mouth/Throat:     Mouth: Mucous membranes are moist.  Eyes:     General: No scleral icterus. Cardiovascular:     Rate and Rhythm: Normal rate and regular rhythm.  Pulmonary:     Effort: Pulmonary effort is normal.     Breath sounds: Normal breath sounds.  Abdominal:     General: There is no distension.     Comments: Bilateral lower abdominal drains in place with dressings clean, dry, and intact.  Trace serous fluid in bags.    RLQ ileostomy in place.  Musculoskeletal:     Right lower leg: Edema present.     Left lower leg: Edema present.  Skin:    General: Skin is warm and dry.  Neurological:     Mental Status: She is alert and oriented to person, place, and time.     Imaging: CT AP from today with resolved abdominal fluid collections.  BLE femoral DVT.  Labs:  CBC: Recent Labs    02/17/22 1630 02/18/22 1140 02/19/22 0509 02/19/22 1954 02/20/22 0545  WBC 15.0* 12.4* 10.7*  --  10.8*  HGB 8.0* 7.7* 7.0* 8.4* 8.6*  HCT 24.8* 24.0* 22.1*  --  25.6*  PLT 279 273 240  --  220    COAGS: Recent Labs     01/13/22 1138 01/18/22 2100 02/17/22 1630 02/18/22 0545  INR 1.0 1.2 1.3* 1.2  APTT 21* 29  --   --     BMP: Recent Labs    02/17/22 1630 02/18/22 0545 02/18/22 1140 02/19/22 0509 02/20/22 0545  NA 129*  --  134* 134* 135  K 3.5  --  3.3* 3.2* 3.1*  CL 99  --  101 107 110  CO2 20*  --  23 21* 19*  GLUCOSE 138*  --  91 104* 119*  BUN 9  --  8 9 10   CALCIUM 7.8*  --  7.9* 7.7* 8.0*  CREATININE 0.52 0.51 0.49 0.47 0.35*  GFRNONAA >60 >60 >60 >60 >60    LIVER FUNCTION TESTS: Recent Labs    05/12/21 1018 01/12/22 2051 01/18/22 0929 02/17/22 1630  BILITOT 0.6 0.6 1.1 0.3  AST 22 18 29  12*  ALT 15 15 27 9   ALKPHOS 105 102 75 77  PROT 6.8 6.1* 5.9* 5.4*  ALBUMIN 4.1 3.9 3.4* 2.0*    Assessment and Plan: 81 year old Lindsey Thomas with history of subtotal colectomy and end ileostomy complicated by intraabdominal abscesses status post bilateral lower abdominal percutaneous drain placements on 02/18/22.  Fluid collections have resolved, and no evidence of enteric fistula on drain injections today.  Drains were removed successfully.  She does have bilateral femoral DVT, incompletely evaluated, on CT today, with bilateral lower extremity pain and swelling.  I have notified Dr. Ferrel Logan and recommended DVT US for further evaluation.  Follow up with IR as needed.    Electronically Signed: Rosanne Ashing Azarah Dacy 03/04/2022, 9:08 AM   I spent a total of 25 Minutes in face to face in clinical consultation, greater than 50% of which was counseling/coordinating care for drain care.

## 2022-03-05 DIAGNOSIS — K631 Perforation of intestine (nontraumatic): Secondary | ICD-10-CM | POA: Diagnosis not present

## 2022-03-05 DIAGNOSIS — K56609 Unspecified intestinal obstruction, unspecified as to partial versus complete obstruction: Secondary | ICD-10-CM | POA: Diagnosis not present

## 2022-03-05 DIAGNOSIS — K5732 Diverticulitis of large intestine without perforation or abscess without bleeding: Secondary | ICD-10-CM | POA: Diagnosis not present

## 2022-03-05 DIAGNOSIS — Z932 Ileostomy status: Secondary | ICD-10-CM | POA: Diagnosis not present

## 2022-03-08 ENCOUNTER — Ambulatory Visit (HOSPITAL_COMMUNITY)
Admission: RE | Admit: 2022-03-08 | Discharge: 2022-03-08 | Disposition: A | Discharge status: Home / Self Care | Payer: Medicare HMO | Source: Ambulatory Visit | Attending: Internal Medicine | Admitting: Internal Medicine

## 2022-03-08 DIAGNOSIS — Z432 Encounter for attention to ileostomy: Secondary | ICD-10-CM | POA: Diagnosis not present

## 2022-03-08 DIAGNOSIS — A419 Sepsis, unspecified organism: Secondary | ICD-10-CM | POA: Diagnosis not present

## 2022-03-08 DIAGNOSIS — T8189XD Other complications of procedures, not elsewhere classified, subsequent encounter: Secondary | ICD-10-CM | POA: Diagnosis not present

## 2022-03-08 DIAGNOSIS — M170 Bilateral primary osteoarthritis of knee: Secondary | ICD-10-CM | POA: Diagnosis not present

## 2022-03-08 DIAGNOSIS — T8143XA Infection following a procedure, organ and space surgical site, initial encounter: Secondary | ICD-10-CM | POA: Diagnosis not present

## 2022-03-08 DIAGNOSIS — G43909 Migraine, unspecified, not intractable, without status migrainosus: Secondary | ICD-10-CM | POA: Diagnosis not present

## 2022-03-08 DIAGNOSIS — T8141XA Infection following a procedure, superficial incisional surgical site, initial encounter: Secondary | ICD-10-CM | POA: Diagnosis not present

## 2022-03-08 DIAGNOSIS — D649 Anemia, unspecified: Secondary | ICD-10-CM | POA: Diagnosis not present

## 2022-03-08 DIAGNOSIS — L24B3 Irritant contact dermatitis related to fecal or urinary stoma or fistula: Secondary | ICD-10-CM | POA: Diagnosis not present

## 2022-03-08 DIAGNOSIS — J45909 Unspecified asthma, uncomplicated: Secondary | ICD-10-CM | POA: Diagnosis not present

## 2022-03-08 DIAGNOSIS — X58XXXA Exposure to other specified factors, initial encounter: Secondary | ICD-10-CM | POA: Diagnosis not present

## 2022-03-08 DIAGNOSIS — L02211 Cutaneous abscess of abdominal wall: Secondary | ICD-10-CM | POA: Diagnosis not present

## 2022-03-08 DIAGNOSIS — J439 Emphysema, unspecified: Secondary | ICD-10-CM | POA: Diagnosis not present

## 2022-03-08 NOTE — Progress Notes (Signed)
[facility]   Reason for visit:  RLQ ileostomy with midline incision Spouse states pouch is leaking daily.  HPI:  Diverticulitis  with postoperative abdominal abscess and ileostomy ROS  Review of Systems  Constitutional:  Positive for fatigue.  Cardiovascular:  Positive for leg swelling.       Currently being treated for DVT in bilateral legs  Gastrointestinal:        RLQ ileostomy  Skin:  Positive for rash.       Peristomal breakdown  Psychiatric/Behavioral:  Positive for sleep disturbance.        Frustrated with pouch leaks   All other systems reviewed and are negative.  Vital signs:  BP (!) 92/52 (BP Location: Right Arm)   Pulse 88   Temp 97.9 F (36.6 C) (Oral)   Resp 20   SpO2 100%  Exam:  Physical Exam Vitals reviewed.  Abdominal:     Palpations: Abdomen is soft.     Comments: Midline abdominal incision, healing. Nonintact on proximal and distal ends.   Skin:    Findings: Rash present.  Neurological:     Mental Status: She is alert and oriented to person, place, and time.  Psychiatric:        Mood and Affect: Mood normal.     Stoma type/location:  RLQ ileostomy Stomal assessment/size:  1"  Peristomal assessment:  denuded skin, tender to touch Treatment options for stomal/peristomal skin: stoma powder and skin prep  barrier strips in creasing AND around stoma to create a flat pouching surface Output: soft yellow stool Ostomy pouching: 1pc. CONVEX pouch with barrier ring and belt.   Education provided:  I stressed the importance of wearing the belt for added security.  Changed dressing to midline wound. OPen only at proximal and distal end. Recommend only dressing these areas and leaving healed center open to promote pouch seal.     Impression/dx  Ileostomy Contact dermatitis Barrier strips to fill in creasing Must wear belt to hold pouch secure Discussion  See above Plan  See back in 2 weeks    Visit time: 45 minutes.   Domenic Moras  FNP-BC

## 2022-03-09 DIAGNOSIS — T8189XA Other complications of procedures, not elsewhere classified, initial encounter: Secondary | ICD-10-CM | POA: Insufficient documentation

## 2022-03-09 DIAGNOSIS — Z432 Encounter for attention to ileostomy: Secondary | ICD-10-CM | POA: Insufficient documentation

## 2022-03-09 DIAGNOSIS — L24B3 Irritant contact dermatitis related to fecal or urinary stoma or fistula: Secondary | ICD-10-CM | POA: Insufficient documentation

## 2022-03-09 NOTE — Discharge Instructions (Signed)
Wear ostomy belt.  1 piece convex pouch Barrier ring to fill in creases.

## 2022-03-11 ENCOUNTER — Other Ambulatory Visit: Payer: Self-pay | Admitting: General Surgery

## 2022-03-11 ENCOUNTER — Inpatient Hospital Stay
Admission: RE | Admit: 2022-03-11 | Discharge: 2022-03-11 | Disposition: A | Discharge status: Home / Self Care | Payer: Medicare HMO | Source: Ambulatory Visit | Attending: General Surgery | Admitting: General Surgery

## 2022-03-11 DIAGNOSIS — Z803 Family history of malignant neoplasm of breast: Secondary | ICD-10-CM | POA: Diagnosis not present

## 2022-03-11 DIAGNOSIS — I82513 Chronic embolism and thrombosis of femoral vein, bilateral: Secondary | ICD-10-CM | POA: Diagnosis present

## 2022-03-11 DIAGNOSIS — Z801 Family history of malignant neoplasm of trachea, bronchus and lung: Secondary | ICD-10-CM | POA: Diagnosis not present

## 2022-03-11 DIAGNOSIS — B999 Unspecified infectious disease: Secondary | ICD-10-CM | POA: Diagnosis not present

## 2022-03-11 DIAGNOSIS — Z7901 Long term (current) use of anticoagulants: Secondary | ICD-10-CM | POA: Diagnosis not present

## 2022-03-11 DIAGNOSIS — K56609 Unspecified intestinal obstruction, unspecified as to partial versus complete obstruction: Secondary | ICD-10-CM | POA: Diagnosis not present

## 2022-03-11 DIAGNOSIS — E871 Hypo-osmolality and hyponatremia: Secondary | ICD-10-CM | POA: Diagnosis present

## 2022-03-11 DIAGNOSIS — R5082 Postprocedural fever: Secondary | ICD-10-CM | POA: Insufficient documentation

## 2022-03-11 DIAGNOSIS — I82533 Chronic embolism and thrombosis of popliteal vein, bilateral: Secondary | ICD-10-CM | POA: Diagnosis present

## 2022-03-11 DIAGNOSIS — Z933 Colostomy status: Secondary | ICD-10-CM | POA: Diagnosis not present

## 2022-03-11 DIAGNOSIS — Z6834 Body mass index (BMI) 34.0-34.9, adult: Secondary | ICD-10-CM | POA: Diagnosis not present

## 2022-03-11 DIAGNOSIS — Z932 Ileostomy status: Secondary | ICD-10-CM | POA: Diagnosis not present

## 2022-03-11 DIAGNOSIS — E039 Hypothyroidism, unspecified: Secondary | ICD-10-CM | POA: Diagnosis present

## 2022-03-11 DIAGNOSIS — L89150 Pressure ulcer of sacral region, unstageable: Secondary | ICD-10-CM | POA: Diagnosis present

## 2022-03-11 DIAGNOSIS — T8189XD Other complications of procedures, not elsewhere classified, subsequent encounter: Secondary | ICD-10-CM | POA: Insufficient documentation

## 2022-03-11 DIAGNOSIS — G8918 Other acute postprocedural pain: Secondary | ICD-10-CM | POA: Insufficient documentation

## 2022-03-11 DIAGNOSIS — T8189XA Other complications of procedures, not elsewhere classified, initial encounter: Secondary | ICD-10-CM | POA: Diagnosis present

## 2022-03-11 DIAGNOSIS — J439 Emphysema, unspecified: Secondary | ICD-10-CM | POA: Diagnosis present

## 2022-03-11 DIAGNOSIS — Z808 Family history of malignant neoplasm of other organs or systems: Secondary | ICD-10-CM | POA: Diagnosis not present

## 2022-03-11 DIAGNOSIS — I82723 Chronic embolism and thrombosis of deep veins of upper extremity, bilateral: Secondary | ICD-10-CM | POA: Diagnosis not present

## 2022-03-11 DIAGNOSIS — D649 Anemia, unspecified: Secondary | ICD-10-CM | POA: Diagnosis present

## 2022-03-11 DIAGNOSIS — K631 Perforation of intestine (nontraumatic): Secondary | ICD-10-CM | POA: Diagnosis not present

## 2022-03-11 DIAGNOSIS — I824Y2 Acute embolism and thrombosis of unspecified deep veins of left proximal lower extremity: Secondary | ICD-10-CM | POA: Diagnosis not present

## 2022-03-11 DIAGNOSIS — I700 Atherosclerosis of aorta: Secondary | ICD-10-CM | POA: Diagnosis not present

## 2022-03-11 DIAGNOSIS — T8143XA Infection following a procedure, organ and space surgical site, initial encounter: Secondary | ICD-10-CM | POA: Diagnosis present

## 2022-03-11 DIAGNOSIS — R109 Unspecified abdominal pain: Secondary | ICD-10-CM | POA: Insufficient documentation

## 2022-03-11 DIAGNOSIS — R188 Other ascites: Secondary | ICD-10-CM | POA: Diagnosis present

## 2022-03-11 DIAGNOSIS — K5732 Diverticulitis of large intestine without perforation or abscess without bleeding: Secondary | ICD-10-CM | POA: Diagnosis not present

## 2022-03-11 DIAGNOSIS — E7800 Pure hypercholesterolemia, unspecified: Secondary | ICD-10-CM | POA: Diagnosis present

## 2022-03-11 DIAGNOSIS — Z7951 Long term (current) use of inhaled steroids: Secondary | ICD-10-CM | POA: Diagnosis not present

## 2022-03-11 DIAGNOSIS — K3189 Other diseases of stomach and duodenum: Secondary | ICD-10-CM | POA: Diagnosis not present

## 2022-03-11 DIAGNOSIS — N739 Female pelvic inflammatory disease, unspecified: Secondary | ICD-10-CM | POA: Diagnosis not present

## 2022-03-11 DIAGNOSIS — K219 Gastro-esophageal reflux disease without esophagitis: Secondary | ICD-10-CM | POA: Diagnosis present

## 2022-03-11 DIAGNOSIS — F419 Anxiety disorder, unspecified: Secondary | ICD-10-CM | POA: Diagnosis present

## 2022-03-11 DIAGNOSIS — E669 Obesity, unspecified: Secondary | ICD-10-CM | POA: Diagnosis present

## 2022-03-11 DIAGNOSIS — Y838 Other surgical procedures as the cause of abnormal reaction of the patient, or of later complication, without mention of misadventure at the time of the procedure: Secondary | ICD-10-CM | POA: Diagnosis present

## 2022-03-11 DIAGNOSIS — E872 Acidosis, unspecified: Secondary | ICD-10-CM | POA: Diagnosis present

## 2022-03-11 DIAGNOSIS — K651 Peritoneal abscess: Secondary | ICD-10-CM | POA: Diagnosis present

## 2022-03-11 DIAGNOSIS — I825Y3 Chronic embolism and thrombosis of unspecified deep veins of proximal lower extremity, bilateral: Secondary | ICD-10-CM | POA: Diagnosis not present

## 2022-03-11 DIAGNOSIS — T8149XA Infection following a procedure, other surgical site, initial encounter: Secondary | ICD-10-CM | POA: Diagnosis not present

## 2022-03-11 MED ORDER — IOHEXOL 300 MG/ML  SOLN: 100.0000 mL | Freq: Once | INTRAMUSCULAR | Status: DC | PRN

## 2022-03-11 MED ORDER — IOHEXOL 300 MG/ML  SOLN
100.0000 mL | Freq: Once | INTRAMUSCULAR | Status: AC | PRN
  Administered 2022-03-11: 100 mL via INTRAVENOUS

## 2022-03-12 DIAGNOSIS — K5732 Diverticulitis of large intestine without perforation or abscess without bleeding: Secondary | ICD-10-CM | POA: Diagnosis not present

## 2022-03-12 DIAGNOSIS — K631 Perforation of intestine (nontraumatic): Secondary | ICD-10-CM | POA: Diagnosis not present

## 2022-03-12 DIAGNOSIS — K56609 Unspecified intestinal obstruction, unspecified as to partial versus complete obstruction: Secondary | ICD-10-CM | POA: Diagnosis not present

## 2022-03-12 DIAGNOSIS — Z932 Ileostomy status: Secondary | ICD-10-CM | POA: Diagnosis not present

## 2022-03-13 ENCOUNTER — Emergency Department: Payer: Medicare HMO

## 2022-03-13 ENCOUNTER — Inpatient Hospital Stay
Admission: EM | Admit: 2022-03-13 | Discharge: 2022-03-17 | DRG: 862 | Disposition: A | Discharge status: Home Health | Payer: Medicare HMO | Attending: Internal Medicine | Admitting: Internal Medicine

## 2022-03-13 ENCOUNTER — Other Ambulatory Visit: Payer: Self-pay

## 2022-03-13 DIAGNOSIS — Z933 Colostomy status: Secondary | ICD-10-CM

## 2022-03-13 DIAGNOSIS — T8143XA Infection following a procedure, organ and space surgical site, initial encounter: Principal | ICD-10-CM | POA: Diagnosis present

## 2022-03-13 DIAGNOSIS — Z7901 Long term (current) use of anticoagulants: Secondary | ICD-10-CM

## 2022-03-13 DIAGNOSIS — Z79899 Other long term (current) drug therapy: Secondary | ICD-10-CM

## 2022-03-13 DIAGNOSIS — T8149XA Infection following a procedure, other surgical site, initial encounter: Secondary | ICD-10-CM

## 2022-03-13 DIAGNOSIS — J439 Emphysema, unspecified: Secondary | ICD-10-CM | POA: Diagnosis present

## 2022-03-13 DIAGNOSIS — Z825 Family history of asthma and other chronic lower respiratory diseases: Secondary | ICD-10-CM

## 2022-03-13 DIAGNOSIS — Z9841 Cataract extraction status, right eye: Secondary | ICD-10-CM

## 2022-03-13 DIAGNOSIS — I82533 Chronic embolism and thrombosis of popliteal vein, bilateral: Secondary | ICD-10-CM | POA: Diagnosis present

## 2022-03-13 DIAGNOSIS — K219 Gastro-esophageal reflux disease without esophagitis: Secondary | ICD-10-CM | POA: Diagnosis present

## 2022-03-13 DIAGNOSIS — Z85118 Personal history of other malignant neoplasm of bronchus and lung: Secondary | ICD-10-CM

## 2022-03-13 DIAGNOSIS — K651 Peritoneal abscess: Secondary | ICD-10-CM | POA: Diagnosis present

## 2022-03-13 DIAGNOSIS — F419 Anxiety disorder, unspecified: Secondary | ICD-10-CM | POA: Diagnosis present

## 2022-03-13 DIAGNOSIS — Z818 Family history of other mental and behavioral disorders: Secondary | ICD-10-CM

## 2022-03-13 DIAGNOSIS — L89150 Pressure ulcer of sacral region, unstageable: Secondary | ICD-10-CM | POA: Diagnosis present

## 2022-03-13 DIAGNOSIS — E039 Hypothyroidism, unspecified: Secondary | ICD-10-CM | POA: Diagnosis present

## 2022-03-13 DIAGNOSIS — Z7951 Long term (current) use of inhaled steroids: Secondary | ICD-10-CM

## 2022-03-13 DIAGNOSIS — Y838 Other surgical procedures as the cause of abnormal reaction of the patient, or of later complication, without mention of misadventure at the time of the procedure: Secondary | ICD-10-CM | POA: Diagnosis present

## 2022-03-13 DIAGNOSIS — Z801 Family history of malignant neoplasm of trachea, bronchus and lung: Secondary | ICD-10-CM

## 2022-03-13 DIAGNOSIS — Z803 Family history of malignant neoplasm of breast: Secondary | ICD-10-CM

## 2022-03-13 DIAGNOSIS — Z9049 Acquired absence of other specified parts of digestive tract: Secondary | ICD-10-CM

## 2022-03-13 DIAGNOSIS — Z7982 Long term (current) use of aspirin: Secondary | ICD-10-CM

## 2022-03-13 DIAGNOSIS — R188 Other ascites: Secondary | ICD-10-CM | POA: Diagnosis present

## 2022-03-13 DIAGNOSIS — T8189XA Other complications of procedures, not elsewhere classified, initial encounter: Secondary | ICD-10-CM | POA: Diagnosis present

## 2022-03-13 DIAGNOSIS — R911 Solitary pulmonary nodule: Secondary | ICD-10-CM | POA: Diagnosis present

## 2022-03-13 DIAGNOSIS — Z9842 Cataract extraction status, left eye: Secondary | ICD-10-CM

## 2022-03-13 DIAGNOSIS — C3490 Malignant neoplasm of unspecified part of unspecified bronchus or lung: Secondary | ICD-10-CM | POA: Diagnosis present

## 2022-03-13 DIAGNOSIS — E7800 Pure hypercholesterolemia, unspecified: Secondary | ICD-10-CM | POA: Diagnosis present

## 2022-03-13 DIAGNOSIS — K659 Peritonitis, unspecified: Secondary | ICD-10-CM | POA: Diagnosis present

## 2022-03-13 DIAGNOSIS — B954 Other streptococcus as the cause of diseases classified elsewhere: Secondary | ICD-10-CM | POA: Diagnosis present

## 2022-03-13 DIAGNOSIS — I824Y2 Acute embolism and thrombosis of unspecified deep veins of left proximal lower extremity: Secondary | ICD-10-CM | POA: Diagnosis present

## 2022-03-13 DIAGNOSIS — B999 Unspecified infectious disease: Secondary | ICD-10-CM

## 2022-03-13 DIAGNOSIS — Z791 Long term (current) use of non-steroidal anti-inflammatories (NSAID): Secondary | ICD-10-CM

## 2022-03-13 DIAGNOSIS — Z6834 Body mass index (BMI) 34.0-34.9, adult: Secondary | ICD-10-CM

## 2022-03-13 DIAGNOSIS — Z880 Allergy status to penicillin: Secondary | ICD-10-CM

## 2022-03-13 DIAGNOSIS — Z831 Family history of other infectious and parasitic diseases: Secondary | ICD-10-CM

## 2022-03-13 DIAGNOSIS — G4730 Sleep apnea, unspecified: Secondary | ICD-10-CM | POA: Diagnosis present

## 2022-03-13 DIAGNOSIS — Z923 Personal history of irradiation: Secondary | ICD-10-CM

## 2022-03-13 DIAGNOSIS — I82513 Chronic embolism and thrombosis of femoral vein, bilateral: Secondary | ICD-10-CM | POA: Diagnosis present

## 2022-03-13 DIAGNOSIS — D649 Anemia, unspecified: Secondary | ICD-10-CM | POA: Diagnosis present

## 2022-03-13 DIAGNOSIS — L8990 Pressure ulcer of unspecified site, unspecified stage: Secondary | ICD-10-CM | POA: Diagnosis present

## 2022-03-13 DIAGNOSIS — Z808 Family history of malignant neoplasm of other organs or systems: Secondary | ICD-10-CM

## 2022-03-13 DIAGNOSIS — Z90710 Acquired absence of both cervix and uterus: Secondary | ICD-10-CM

## 2022-03-13 DIAGNOSIS — I82723 Chronic embolism and thrombosis of deep veins of upper extremity, bilateral: Secondary | ICD-10-CM | POA: Insufficient documentation

## 2022-03-13 DIAGNOSIS — E872 Acidosis, unspecified: Secondary | ICD-10-CM | POA: Diagnosis present

## 2022-03-13 DIAGNOSIS — E871 Hypo-osmolality and hyponatremia: Secondary | ICD-10-CM | POA: Diagnosis present

## 2022-03-13 DIAGNOSIS — Z888 Allergy status to other drugs, medicaments and biological substances status: Secondary | ICD-10-CM

## 2022-03-13 DIAGNOSIS — E669 Obesity, unspecified: Secondary | ICD-10-CM | POA: Diagnosis present

## 2022-03-13 DIAGNOSIS — Z881 Allergy status to other antibiotic agents status: Secondary | ICD-10-CM

## 2022-03-13 DIAGNOSIS — Z961 Presence of intraocular lens: Secondary | ICD-10-CM | POA: Diagnosis present

## 2022-03-13 LAB — CBC WITH DIFFERENTIAL/PLATELET
Abs Immature Granulocytes: 0.15 10*3/uL — ABNORMAL HIGH (ref 0.00–0.07)
Basophils Absolute: 0.1 10*3/uL (ref 0.0–0.1)
Basophils Relative: 1 %
Eosinophils Absolute: 0.2 10*3/uL (ref 0.0–0.5)
Eosinophils Relative: 2 %
HCT: 28.7 % — ABNORMAL LOW (ref 36.0–46.0)
Hemoglobin: 9.2 g/dL — ABNORMAL LOW (ref 12.0–15.0)
Immature Granulocytes: 2 %
Lymphocytes Relative: 9 %
Lymphs Abs: 0.8 10*3/uL (ref 0.7–4.0)
MCH: 28.1 pg (ref 26.0–34.0)
MCHC: 32.1 g/dL (ref 30.0–36.0)
MCV: 87.8 fL (ref 80.0–100.0)
Monocytes Absolute: 0.9 10*3/uL (ref 0.1–1.0)
Monocytes Relative: 11 %
Neutro Abs: 6.4 10*3/uL (ref 1.7–7.7)
Neutrophils Relative %: 75 %
Platelets: 311 10*3/uL (ref 150–400)
RBC: 3.27 MIL/uL — ABNORMAL LOW (ref 3.87–5.11)
RDW: 16.8 % — ABNORMAL HIGH (ref 11.5–15.5)
WBC: 8.5 10*3/uL (ref 4.0–10.5)
nRBC: 0 % (ref 0.0–0.2)

## 2022-03-13 LAB — COMPREHENSIVE METABOLIC PANEL
ALT: 8 U/L (ref 0–44)
AST: 13 U/L — ABNORMAL LOW (ref 15–41)
Albumin: 2.9 g/dL — ABNORMAL LOW (ref 3.5–5.0)
Alkaline Phosphatase: 88 U/L (ref 38–126)
Anion gap: 9 (ref 5–15)
BUN: 19 mg/dL (ref 8–23)
CO2: 18 mmol/L — ABNORMAL LOW (ref 22–32)
Calcium: 8.4 mg/dL — ABNORMAL LOW (ref 8.9–10.3)
Chloride: 103 mmol/L (ref 98–111)
Creatinine, Ser: 1 mg/dL (ref 0.44–1.00)
GFR, Estimated: 56 mL/min — ABNORMAL LOW (ref >60)
Glucose, Bld: 89 mg/dL (ref 70–99)
Potassium: 3.9 mmol/L (ref 3.5–5.1)
Sodium: 130 mmol/L — ABNORMAL LOW (ref 135–145)
Total Bilirubin: 0.4 mg/dL (ref 0.3–1.2)
Total Protein: 6.7 g/dL (ref 6.5–8.1)

## 2022-03-13 LAB — PROTIME-INR
INR: 1.7 — ABNORMAL HIGH (ref 0.8–1.2)
Prothrombin Time: 19.7 seconds — ABNORMAL HIGH (ref 11.4–15.2)

## 2022-03-13 LAB — APTT: aPTT: 43 seconds — ABNORMAL HIGH (ref 24–36)

## 2022-03-13 LAB — LACTIC ACID, PLASMA
Lactic Acid, Venous: 1.4 mmol/L (ref 0.5–1.9)
Lactic Acid, Venous: 1 mmol/L (ref 0.5–1.9)

## 2022-03-13 LAB — MAGNESIUM: Magnesium: 1 mg/dL — ABNORMAL LOW (ref 1.7–2.4)

## 2022-03-13 LAB — PHOSPHORUS: Phosphorus: 3.4 mg/dL (ref 2.5–4.6)

## 2022-03-13 MED ORDER — SODIUM CHLORIDE 0.9 % IV BOLUS
500.0000 mL | Freq: Once | INTRAVENOUS | Status: AC
  Administered 2022-03-13: 500 mL via INTRAVENOUS

## 2022-03-13 MED ORDER — MORPHINE SULFATE (PF) 2 MG/ML IV SOLN: 2.0000 mg | INTRAVENOUS | Status: DC | PRN

## 2022-03-13 MED ORDER — SENNOSIDES-DOCUSATE SODIUM 8.6-50 MG PO TABS: 1.0000 | ORAL_TABLET | Freq: Every evening | ORAL | Status: DC | PRN

## 2022-03-13 MED ORDER — METRONIDAZOLE 500 MG/100ML IV SOLN: 500.0000 mg | Freq: Once | INTRAVENOUS | Status: DC

## 2022-03-13 MED ORDER — MAGNESIUM SULFATE 2 GM/50ML IV SOLN
2.0000 g | Freq: Once | INTRAVENOUS | Status: AC
  Administered 2022-03-14: 2 g via INTRAVENOUS
  Filled 2022-03-13 (×2): qty 50

## 2022-03-13 MED ORDER — METRONIDAZOLE 500 MG/100ML IV SOLN
500.0000 mg | Freq: Two times a day (BID) | INTRAVENOUS | Status: DC
  Administered 2022-03-13 – 2022-03-17 (×9): 500 mg via INTRAVENOUS
  Filled 2022-03-13 (×10): qty 100

## 2022-03-13 MED ORDER — SODIUM CHLORIDE 0.9 % IV SOLN: INTRAVENOUS | Status: AC

## 2022-03-13 MED ORDER — IOHEXOL 300 MG/ML  SOLN: 100.0000 mL | Freq: Once | INTRAMUSCULAR | Status: DC | PRN

## 2022-03-13 MED ORDER — VITAMIN B-12 1000 MCG PO TABS
1000.0000 ug | ORAL_TABLET | Freq: Every day | ORAL | Status: DC
  Administered 2022-03-14 – 2022-03-17 (×4): 1000 ug via ORAL
  Filled 2022-03-13 (×4): qty 1

## 2022-03-13 MED ORDER — LORAZEPAM 0.5 MG PO TABS: 0.5000 mg | ORAL_TABLET | Freq: Four times a day (QID) | ORAL | Status: DC | PRN

## 2022-03-13 MED ORDER — VITAMIN C 500 MG PO TABS
500.0000 mg | ORAL_TABLET | Freq: Two times a day (BID) | ORAL | Status: DC
  Administered 2022-03-13 – 2022-03-17 (×8): 500 mg via ORAL
  Filled 2022-03-13 (×8): qty 1

## 2022-03-13 MED ORDER — PANTOPRAZOLE SODIUM 40 MG PO TBEC
40.0000 mg | DELAYED_RELEASE_TABLET | Freq: Two times a day (BID) | ORAL | Status: DC
  Administered 2022-03-13 – 2022-03-17 (×7): 40 mg via ORAL
  Filled 2022-03-13 (×7): qty 1

## 2022-03-13 MED ORDER — ACETAMINOPHEN 325 MG PO TABS
650.0000 mg | ORAL_TABLET | Freq: Four times a day (QID) | ORAL | Status: DC | PRN
  Administered 2022-03-17: 650 mg via ORAL
  Filled 2022-03-13: qty 2

## 2022-03-13 MED ORDER — SODIUM CHLORIDE 0.9 % IV BOLUS
1000.0000 mL | Freq: Once | INTRAVENOUS | Status: AC
  Administered 2022-03-13: 1000 mL via INTRAVENOUS

## 2022-03-13 MED ORDER — ZINC SULFATE 220 (50 ZN) MG PO CAPS
220.0000 mg | ORAL_CAPSULE | Freq: Every day | ORAL | Status: AC
  Administered 2022-03-13 – 2022-03-15 (×3): 220 mg via ORAL
  Filled 2022-03-13 (×3): qty 1

## 2022-03-13 MED ORDER — MONTELUKAST SODIUM 10 MG PO TABS
10.0000 mg | ORAL_TABLET | Freq: Every day | ORAL | Status: DC
  Administered 2022-03-13 – 2022-03-16 (×4): 10 mg via ORAL
  Filled 2022-03-13 (×4): qty 1

## 2022-03-13 MED ORDER — HEPARIN BOLUS VIA INFUSION
2000.0000 [IU] | Freq: Once | INTRAVENOUS | Status: DC
  Filled 2022-03-13: qty 2000

## 2022-03-13 MED ORDER — TRAMADOL HCL 50 MG PO TABS
50.0000 mg | ORAL_TABLET | Freq: Four times a day (QID) | ORAL | Status: DC | PRN
  Administered 2022-03-15 – 2022-03-17 (×3): 50 mg via ORAL
  Filled 2022-03-13 (×3): qty 1

## 2022-03-13 MED ORDER — CIPROFLOXACIN IN D5W 400 MG/200ML IV SOLN
400.0000 mg | Freq: Two times a day (BID) | INTRAVENOUS | Status: DC
  Administered 2022-03-14 – 2022-03-17 (×8): 400 mg via INTRAVENOUS
  Filled 2022-03-13 (×9): qty 200

## 2022-03-13 MED ORDER — MOMETASONE FURO-FORMOTEROL FUM 200-5 MCG/ACT IN AERO
2.0000 | INHALATION_SPRAY | Freq: Two times a day (BID) | RESPIRATORY_TRACT | Status: DC
  Administered 2022-03-14 – 2022-03-17 (×8): 2 via RESPIRATORY_TRACT
  Filled 2022-03-13: qty 8.8

## 2022-03-13 MED ORDER — ONDANSETRON HCL 4 MG PO TABS: 4.0000 mg | ORAL_TABLET | Freq: Four times a day (QID) | ORAL | Status: DC | PRN

## 2022-03-13 MED ORDER — ONDANSETRON HCL 4 MG/2ML IJ SOLN
4.0000 mg | Freq: Four times a day (QID) | INTRAMUSCULAR | Status: DC | PRN
  Filled 2022-03-13: qty 2

## 2022-03-13 MED ORDER — ENSURE ENLIVE PO LIQD
237.0000 mL | Freq: Three times a day (TID) | ORAL | Status: DC
  Administered 2022-03-13 – 2022-03-17 (×9): 237 mL via ORAL

## 2022-03-13 MED ORDER — VITAMIN D 25 MCG (1000 UNIT) PO TABS
5000.0000 [IU] | ORAL_TABLET | Freq: Every day | ORAL | Status: DC
  Administered 2022-03-14 – 2022-03-17 (×4): 5000 [IU] via ORAL
  Filled 2022-03-13 (×4): qty 5

## 2022-03-13 MED ORDER — FENTANYL CITRATE PF 50 MCG/ML IJ SOSY
25.0000 ug | PREFILLED_SYRINGE | INTRAMUSCULAR | Status: DC | PRN
  Administered 2022-03-15: 25 ug via INTRAVENOUS
  Filled 2022-03-13: qty 1

## 2022-03-13 MED ORDER — HEPARIN (PORCINE) 25000 UT/250ML-% IV SOLN
1050.0000 [IU]/h | INTRAVENOUS | Status: AC
  Administered 2022-03-13: 900 [IU]/h via INTRAVENOUS
  Administered 2022-03-14: 1050 [IU]/h via INTRAVENOUS
  Filled 2022-03-13 (×2): qty 250

## 2022-03-13 MED ORDER — CIPROFLOXACIN IN D5W 400 MG/200ML IV SOLN
400.0000 mg | Freq: Once | INTRAVENOUS | Status: AC
  Administered 2022-03-13: 400 mg via INTRAVENOUS
  Filled 2022-03-13: qty 200

## 2022-03-13 MED ORDER — ACETAMINOPHEN 650 MG RE SUPP: 650.0000 mg | Freq: Four times a day (QID) | RECTAL | Status: DC | PRN

## 2022-03-13 NOTE — Assessment & Plan Note (Signed)
-   Bilateral lower extremity DVT in the bilateral common femoral, right greater than left  - Currently on Eliquis - Patient's last dose of Eliquis was on 03/12/2022 nightly - We will hold Eliquis on admission - Heparin GTT and can be discontinued before IR procedure

## 2022-03-13 NOTE — Consult Note (Signed)
Chief Complaint: Patient was seen in consultation today for intra-abdominal fluid collections  at the request of Sheran Luz, PA  Referring Physician(s): Sheran Luz, [place]  Supervising Physician: Corrie Mckusick  Patient Status: [place] - In-pt  History of Present Illness: Lindsey Thomas is a 81 y.o. female presented to [facility] ED from home 03/13/2022 complaining of fever.  Pt is well-known to IR after having 2 drains placed with Dr. Anselm Pancoast for abdominal post-operative abscesses 02/18/22. Pt followed up at [facility] drain clinic 03/04/22 where drain injections showed resolve of fluid collections. Both drains were removed at that time. Pt underwent OP CT AP after c/o persistent leakage of fluid from ileostomy site. CT AP 03/11/22 showed recurrent fluid collections. Dr. Annamaria Boots was consulted and has approved patient for intra-abdominal drain placements for two fluid collections. Procedure tentatively scheduled for 03/15/22 d/t pt last dose of Eliquis 03/12/22.   Past Medical History:  Diagnosis Date   Anxiety    Arthritis    Asthma    DVT (deep venous thrombosis) (HCC)    Edema    feet   Emphysema of lung (HCC)    GERD (gastroesophageal reflux disease)    Headache    MIGRAINES   History of orthopnea    Hypercholesterolemia    Hypothyroidism    NODULES   Lymphedema    Migraine    Non-small cell lung cancer, right ([place]) 05/2019   Rad tx's   Shortness of breath dyspnea    WITH EXERTION   Sleep apnea    MILD, DOES NOT USE CPAP   Thyroid nodule     Past Surgical History:  Procedure Laterality Date   ABDOMINAL HYSTERECTOMY     BREAST BIOPSY     CARDIAC CATHETERIZATION     CATARACT EXTRACTION W/PHACO Left 02/12/2016   Procedure: CATARACT EXTRACTION PHACO AND INTRAOCULAR LENS PLACEMENT ([place]);  Surgeon: Eulogio Bear, MD;  Location: [facility] ORS;  Service: Ophthalmology;  Laterality: Left;  [place] 01:30 AP% 15.2 CDE 13.71 Fluid pack lot # [id number]   CATARACT EXTRACTION W/PHACO Right 03/18/2016    Procedure: CATARACT EXTRACTION PHACO AND INTRAOCULAR LENS PLACEMENT (IOC);  Surgeon: Eulogio Bear, MD;  Location: [facility] ORS;  Service: Ophthalmology;  Laterality: Right;  Lot # [id number] [place]: 01:05.5 AP%:14.3 CDE: 9.33   COLECTOMY WITH COLOSTOMY CREATION/HARTMANN PROCEDURE N/A 01/20/2022   Procedure: COLECTOMY WITH COLOSTOMY CREATION/HARTMANN PROCEDURE;  Surgeon: Herbert Pun, MD;  Location: [facility] ORS;  Service: General;  Laterality: N/A;   FLEXIBLE SIGMOIDOSCOPY N/A 01/14/2022   Procedure: FLEXIBLE SIGMOIDOSCOPY;  Surgeon: Toledo, Benay Pike, MD;  Location: [facility] ENDOSCOPY;  Service: Gastroenterology;  Laterality: N/A;   FLEXIBLE SIGMOIDOSCOPY N/A 01/19/2022   Procedure: FLEXIBLE SIGMOIDOSCOPY;  Surgeon: Lesly Rubenstein, MD;  Location: [facility] ENDOSCOPY;  Service: Endoscopy;  Laterality: N/A;   FRACTURE SURGERY Left    arm rod and screw   IR RADIOLOGIST EVAL & MGMT  03/04/2022   KNEE ARTHROSCOPY     LAPAROTOMY N/A 01/24/2022   Procedure: EXPLORATORY LAPAROTOMY;  Surgeon: Herbert Pun, MD;  Location: [facility] ORS;  Service: General;  Laterality: N/A;   OOPHORECTOMY     RCR     ROTATOR CUFF REPAIR Left 2006    Allergies: Eryc [erythromycin], Levofloxacin, Percocet [oxycodone-acetaminophen], Augmentin [amoxicillin-pot clavulanate], and Celecoxib  Medications: Prior to Admission medications   Medication Sig Start Date End Date Taking? Authorizing Provider  acetaminophen (TYLENOL) 500 MG tablet Take 1,000 mg by mouth daily as needed for pain.    [provider]  aspirin EC 81 MG tablet Take 81 mg by mouth daily.    [provider]  calcium carbonate (CALCIUM ANTACID) 500 MG chewable tablet Chew 2 tablets by mouth daily.    [provider]  Cholecalciferol (VITAMIN D PO) Take 5,000 Units by mouth daily.    [provider]  Cyanocobalamin (VITAMIN B-12 PO) Take 1,000 mcg by mouth daily.    [provider]  feeding supplement (ENSURE  ENLIVE / ENSURE PLUS) LIQD Take 237 mLs by mouth 3 (three) times daily between meals. 01/30/22   [name], MD  fluticasone (FLONASE) 50 MCG/ACT nasal spray Place 2 sprays into both nostrils daily. 07/26/18   [provider]  furosemide (LASIX) 20 MG tablet Take 20 mg by mouth daily. 10/10/17   [provider]  lidocaine (XYLOCAINE) 2 % solution Use as directed 15 mLs in the mouth or throat every 8 (eight) hours as needed for mouth pain. 01/30/22   [name], MD  loperamide (IMODIUM) 2 MG capsule Take 2 capsules (4 mg total) by mouth every 12 (twelve) hours. 01/30/22   [name], MD  magic mouthwash SOLN Take 5 mLs by mouth 4 (four) times daily. 01/30/22   [name], MD  meloxicam (MOBIC) 7.5 MG tablet Take 1 tablet by mouth daily. 10/15/21   [provider]  metoprolol tartrate (LOPRESSOR) 25 MG tablet Take 1 tablet (25 mg total) by mouth 2 (two) times daily. 02/20/22   Annita Brod, MD  montelukast (SINGULAIR) 10 MG tablet Take 10 mg by mouth at bedtime.     [provider]  Multiple Vitamins-Minerals (PRESERVISION AREDS PO) Take 1 capsule by mouth 2 (two) times daily.    [provider]  nystatin (MYCOSTATIN/NYSTOP) powder Apply topically 3 (three) times daily. 01/30/22   [name], MD  omeprazole (PRILOSEC) 20 MG capsule Take 20 mg by mouth 2 (two) times daily before a meal.    [provider]  ondansetron (ZOFRAN) 4 MG tablet Take 1 tablet (4 mg total) by mouth daily as needed for nausea or vomiting. 01/15/22 01/15/23  Enzo Bi, MD  Polyethyl Glycol-Propyl Glycol (SYSTANE) 0.4-0.3 % SOLN Apply 1 drop to eye 2 (two) times daily.    [provider]  SYMBICORT 160-4.5 MCG/ACT inhaler Inhale 2 puffs into the lungs 2 (two) times daily. 06/19/21   [provider]  traMADol (ULTRAM) 50 MG tablet Take 1 tablet (50 mg total) by mouth every 6 (six) hours as needed for severe pain or moderate pain. 01/30/22   [name], MD      Family History  Problem Relation Age of Onset   Emphysema Mother    Heart Problems Mother    Tuberculosis Father    Brain cancer Father    Skin cancer Father    Breast cancer Sister    Irritable bowel syndrome Sister    Lung cancer Brother    Dementia Sister    Schizophrenia Sister     Social History   Socioeconomic History   Marital status: Married    Spouse name: Not on file   Number of children: Not on file   Years of education: Not on file   Highest education level: Not on file  Occupational History   Not on file  Tobacco Use   Smoking status: Never   Smokeless tobacco: Never  Substance and Sexual Activity   Alcohol use: No   Drug use: Never   Sexual activity: Not on file  Other  Topics Concern   Not on file  Social History Narrative   Not on file   Social Determinants of Health   Financial Resource Strain: Not on file  Food Insecurity: Not on file  Transportation Needs: Not on file  Physical Activity: Not on file  Stress: Not on file  Social Connections: Not on file     Review of Systems: A 12 point ROS discussed and pertinent positives are indicated in the HPI above.  All other systems are negative.  Review of Systems  Constitutional:  Positive for appetite change, chills, fatigue and fever.  Respiratory:  Negative for shortness of breath.   Cardiovascular:  Positive for leg swelling. Negative for chest pain.  Gastrointestinal:  Positive for abdominal pain and nausea. Negative for vomiting.  Neurological:  Positive for weakness. Negative for dizziness and headaches.    Vital Signs: BP (!) 100/52 (BP Location: Right Arm)   Pulse 98   Temp 98.2 F (36.8 C) (Oral)   Resp 18   Ht 4\' 9"  (1.448 m)   Wt 158 lb 11.7 oz (72 kg)   SpO2 96%   BMI 34.35 kg/m     Physical Exam Vitals reviewed.  Constitutional:      General: She is not in acute distress.    Appearance: Normal appearance. She is not ill-appearing.  HENT:     Head: Normocephalic  and atraumatic.     Mouth/Throat:     Mouth: Mucous membranes are dry.     Pharynx: Oropharynx is clear.  Eyes:     Extraocular Movements: Extraocular movements intact.     Pupils: Pupils are equal, round, and reactive to light.  Cardiovascular:     Rate and Rhythm: Normal rate and regular rhythm.     Pulses: Normal pulses.     Heart sounds: Normal heart sounds.  Pulmonary:     Effort: Pulmonary effort is normal. No respiratory distress.     Breath sounds: Normal breath sounds.  Abdominal:     General: There is no distension.     Palpations: Abdomen is soft.     Tenderness: There is abdominal tenderness. There is no guarding.     Comments: Ileostomy in place  Musculoskeletal:     Right lower leg: Edema present.     Left lower leg: Edema present.  Skin:    General: Skin is warm and dry.  Neurological:     Mental Status: She is alert and oriented to person, place, and time.  Psychiatric:        Mood and Affect: Mood normal.        Behavior: Behavior normal.        Thought Content: Thought content normal.        Judgment: Judgment normal.     Imaging: DG Chest 2 View  Result Date: 03/13/2022 CLINICAL DATA:  Suspected sepsis EXAM: CHEST - 2 VIEW COMPARISON:  Chest x-ray February 17, 2022 FINDINGS: Stable cardiomediastinal contours. Unchanged calcified atherosclerosis of the aortic arch. Both lungs are clear. The visualized skeletal structures are unremarkable. IMPRESSION: No acute cardiopulmonary abnormality. Electronically Signed   By: Beryle Flock M.D.   On: 03/13/2022 10:56   CT ABDOMEN PELVIS W CONTRAST  Result Date: 03/11/2022 CLINICAL DATA:  Abdominal pain with persistent leakage from ileostomy site status post partial colectomy and ileostomy 01/20/2022. History of lung cancer. EXAM: CT ABDOMEN AND PELVIS WITH CONTRAST TECHNIQUE: Multidetector CT imaging of the abdomen and pelvis was performed using the standard protocol following  bolus administration of intravenous  contrast. RADIATION DOSE REDUCTION: This exam was performed according to the departmental dose-optimization program which includes automated exposure control, adjustment of the mA and/or kV according to patient size and/or use of iterative reconstruction technique. CONTRAST:  121mL OMNIPAQUE IOHEXOL 300 MG/ML  SOLN COMPARISON:  Abdominopelvic CT 03/04/2022 and 02/17/2022. FINDINGS: Lower chest: Stable appearance of the lung bases, without acute findings. No significant pleural effusion. Hepatobiliary: The liver is normal in density without suspicious focal abnormality. No evidence of gallstones, gallbladder wall thickening or biliary dilatation. Pancreas: Unremarkable. No pancreatic ductal dilatation or surrounding inflammatory changes. Spleen: Normal in size without focal abnormality. Adrenals/Urinary Tract: Both adrenal glands appear normal. No evidence of urinary tract calculus, suspicious renal lesion or hydronephrosis. The bladder appears unremarkable for its degree of distention. Stomach/Bowel: Enteric contrast was administered and has passed through the right lower quadrant ileostomy. The stomach appears unremarkable for its degree of distention. No small bowel wall thickening or surrounding inflammation. Status post subtotal colectomy with stable appearance of the [facility] pouch. Vascular/Lymphatic: Extensive nonocclusive bilateral femoral vein deep venous thrombosis again noted, similar to the most recent study. No significant iliac vein extension. The IVC is patent. Aortic and branch vessel atherosclerosis without evidence of aneurysm. There are no enlarged abdominopelvic lymph nodes. Reproductive: Hysterectomy.  No adnexal mass. Other: Bilateral percutaneous pelvic drains have been removed in the interval. There is a small recurrent air-fluid collection on the right anterior to the iliacus muscle, measuring 2.8 x 2.9 cm transverse on image 57/2 and up to 5.5 cm on sagittal image 48/6. There is no contrast  material within this collection. There is a larger left-sided air-fluid collection which measures up to 5.3 x 2.2 cm on axial image 62/2 and extends at least 7.9 cm on coronal image 50/5, extending inferiorly to the vaginal cuff. There is no contrast material within this collection. A small amount of gas remains within the midline abdominal incision without definite enteric contrast. Superficial enteric contrast at the umbilicus appears spilled from the ileostomy. No free air. No ascites. Musculoskeletal: No acute or significant osseous findings. Multilevel thoracolumbar spondylosis. Degenerative changes of both hips. Diffuse pelvic muscular atrophy. IMPRESSION: 1. Interval removal of bilateral percutaneous pelvic drains. There are small recurrent air-fluid collections in both sides of the pelvis which do not contain contrast material, suspicious for small abscesses. No free air or ascites. 2. No evidence of bowel obstruction or ileus. 3. 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 4. 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 5.  Aortic Atherosclerosis (ICD10-I70.0). 6. These results will be called to the ordering clinician or representative by the Radiologist Assistant, and communication documented in the PACS or [employer]. Electronically Signed   By: Richardean Sale M.D.   On: 03/11/2022 14:33   US Venous Img Lower Bilateral (DVT)  Result Date: 03/04/2022 CLINICAL DATA:  81 year old female with common femoral DVT identified on CTA earlier today. EXAM: BILATERAL LOWER EXTREMITY VENOUS DOPPLER ULTRASOUND TECHNIQUE: Gray-scale sonography with compression, as well as color and duplex ultrasound, were performed to evaluate the deep venous system(s) from the level of the common femoral vein through the popliteal and proximal calf veins. COMPARISON:  None Available. FINDINGS: VENOUS Nonocclusive thrombus within the RIGHT common femoral vein noted as well as  occlusive thrombus within the RIGHT femoral, popliteal and calf veins. Nonocclusive thrombus within the common femoral, femoral and posterior tibial veins are noted. OTHER None. Limitations: none IMPRESSION: Bilateral LOWER extremity DVT  involving bilateral common femoral, femoral, RIGHT popliteal and calf veins. Occlusive thrombus is noted within the RIGHT femoral, popliteal and calf veins. Clinical team is aware of LOWER extremity DVT from CT earlier today. This report will be called to the referring clinician by sonography staff. Electronically Signed   By: Margarette Canada M.D.   On: 03/04/2022 17:24   IR Radiologist Eval & Mgmt  Result Date: 03/04/2022 EXAM: ESTABLISHED PATIENT OFFICE VISIT CHIEF COMPLAINT: See epic note. HISTORY OF PRESENT ILLNESS: See epic note. REVIEW OF SYSTEMS: See epic note. PHYSICAL EXAMINATION: See epic note. ASSESSMENT AND PLAN: See epic note. Ruthann Cancer, MD [facility] Radiology Electronically Signed   By: Ruthann Cancer M.D.   On: 03/04/2022 09:57   DG Sinus/Fist Tube Chk-Non GI  Result Date: 03/04/2022 CLINICAL DATA:  81 year old female with history of subtotal colectomy and end ileostomy complicated by intra-abdominal fluid collection status post percutaneous drain placement on 02/18/2022. The patient presents to drain clinic with trace output from the drains and CT evidence of resolution of previously visualized fluid collections. EXAM: ABSCESS INJECTION COMPARISON:  None Available. CONTRAST:  10 mL Omnipaque 300-administered via the existing percutaneous drain. FLUOROSCOPY TIME:  1.5 mGy TECHNIQUE: The patient was positioned supine on the fluoroscopy table. A preprocedural spot fluoroscopic image was obtained of the pelvis and the existing percutaneous drainage catheters. Multiple spot fluoroscopic and radiographic images were obtained following the injection of a small amount of contrast via the existing percutaneous drainage  catheters. The external portion of the drains were cut to release inter pigtails. The retention sutures were removed. The drains were removed successfully without complication. FINDINGS: Resolution of previously visualized fluid collections. No evidence of enteric fistula. The drains were removed successfully. IMPRESSION: Resolution of previously visualized fluid collections without evidence of enteric fistula. The drains were removed successfully. Ruthann Cancer, MD [facility] Radiology Electronically Signed   By: Ruthann Cancer M.D.   On: 03/04/2022 09:25   CT ABDOMEN PELVIS W CONTRAST  Result Date: 03/04/2022 CLINICAL DATA:  81 year old female with history of lower abdominal abscesses status post percutaneous drain placement on 02/18/2022. The patient presents with minimal output from both drains. EXAM: CT ABDOMEN AND PELVIS WITH CONTRAST TECHNIQUE: Multidetector CT imaging of the abdomen and pelvis was performed using the standard protocol following bolus administration of intravenous contrast. RADIATION DOSE REDUCTION: This exam was performed according to the departmental dose-optimization program which includes automated exposure control, adjustment of the mA and/or kV according to patient size and/or use of iterative reconstruction technique. CONTRAST:  139mL ISOVUE-300 IOPAMIDOL (ISOVUE-300) INJECTION 61% COMPARISON:  02/17/2022, 02/18/2022 FINDINGS: Lower chest: No acute abnormality. Hepatobiliary: No focal liver abnormality is seen. No gallstones, gallbladder wall thickening, or biliary dilatation. Pancreas: Unremarkable. No pancreatic ductal dilatation or surrounding inflammatory changes. Spleen: Normal in size without focal abnormality. Adrenals/Urinary Tract: Adrenal glands are unremarkable. Kidneys are normal, without renal calculi, focal lesion, or hydronephrosis. Bladder is unremarkable. Stomach/Bowel: Stomach is within normal limits. Similar  appearing postsurgical changes after partial colectomy. 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 No evidence of bowel wall thickening, distention, or inflammatory changes. Vascular/Lymphatic: Nonocclusive deep vein thrombosis extending to the central aspect of the common femoral veins bilaterally. No evidence of significant iliac extension or evidence of ileo caval thrombosis. Similar-appearing atherosclerotic calcifications are noted peer no abdominopelvic lymphadenopathy. Reproductive: Status post hysterectomy. No adnexal masses. Other: Significant interval healing of previously visualized incisional hernia and left  lower quadrant laparoscopy port site. Musculoskeletal: Unchanged multilevel degenerative changes of the visualized thoracolumbar spine. No acute osseous abnormality. IMPRESSION: 1. Nonocclusive bilateral femoral deep vein thrombosis. No evidence of iliocaval extension. Recommend bilateral lower extremity venous ultrasound for further characterization. 2. Interval resolution of previously visualized bilateral lower abdominal fluid collections with unchanged position of the indwelling drains. 3. Postsurgical changes after subtotal colectomy and right lower quadrant in ileostomy without complicating features. These results will be called to the ordering clinician or representative by the Radiologist Assistant, and communication documented in the PACS or [employer]. Ruthann Cancer, MD [facility] Radiology Electronically Signed   By: Ruthann Cancer M.D.   On: 03/04/2022 09:08   CT GUIDED PERITONEAL/RETROPERITONEAL FLUID DRAIN BY PERC CATH  Result Date: 02/18/2022 INDICATION: 81 year old with recent abdominal surgeries including subtotal colectomy with an end ileostomy. Recent CT demonstrates intra-abdominal abscesses. Plan for CT-guided drainage of the intra-abdominal abscesses. EXAM: CT-guided drain placement in  right lower abdominal/pelvic abscess CT-guided drain placement in left lower abdominal/pelvic abscess. MEDICATIONS: Moderate sedation ANESTHESIA/SEDATION: Moderate (conscious) sedation was employed during this procedure. A total of Versed 1.0mg  and fentanyl 75 mcg was administered intravenously at the order of the provider performing the procedure. Total intra-service moderate sedation time: 32 minutes. Patient's level of consciousness and vital signs were monitored continuously by radiology nurse throughout the procedure under the supervision of the provider performing the procedure. COMPLICATIONS: None immediate. PROCEDURE: Informed written consent was obtained from the patient after a thorough discussion of the procedural risks, benefits and alternatives. All questions were addressed. Maximal Sterile Barrier Technique was utilized including caps, mask, sterile gowns, sterile gloves, sterile drape, hand hygiene and skin antiseptic. A timeout was performed prior to the initiation of the procedure. Patient was placed supine on the CT scanner. Images through the lower abdomen and pelvis were obtained. Abscess collections situated between the right psoas and right iliacus muscle targeted. The right side of the lower abdomen was prepped with chlorhexidine and sterile field was created. Skin was anesthetized with 1% lidocaine. A small incision was made. Using CT guidance, an 18 gauge trocar needle was directed into the collection adjacent to the right psoas muscle. Superstiff Amplatz wire was placed but the wire went into smaller collection anterior to the larger abscess collection. As a result, this wire and needle were removed. A new incision was made in order to target the larger abscess collection. Using CT guidance, the 18 gauge needle was directed into the abscess and yellow purulent fluid was aspirated. Superstiff Amplatz wire was placed. Tract was dilated to accommodate a 10 [place] multipurpose drain and  approximately 20 mL of purulent fluid was removed. Drain was attached to a suction bulb and sutured to skin. Attention was directed to the abscess in the left lower abdomen. Left lower abdomen was prepped with chlorhexidine and sterile field was created. Skin was anesthetized using 1% lidocaine. Small incision was made. Using CT guidance, an 18 gauge trocar needle was directed into the air-fluid collection and purulent fluid was aspirated. Superstiff Amplatz wire was placed and the tract was dilated to accommodate a 10 [place] multipurpose drain. 20 mL of thick yellow purulent fluid was removed. Follow up CT images were obtained. Drain was attached to a suction bulb and sutured to skin. Fluid samples were sent for culture. RADIATION DOSE REDUCTION: This exam was performed according to the departmental dose-optimization program which includes automated exposure control, adjustment of the mA and/or kV according to patient  size and/or use of iterative reconstruction technique. FINDINGS: Multiple air-fluid collections in the right lower abdomen. Largest abscess was situated between the right psoas muscle and right iliacus muscle. Purulent fluid was removed from the largest collection. Drain was placed in the largest abscess collection. Drain was successfully placed in the air-fluid collection in the left lower abdomen adjacent to the left psoas muscle. 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 IMPRESSION: Successful placement of a CT-guided drain in the left lower abdominal abscess and CT-guided placement of a drain in the right lower abdominal abscess. Electronically Signed   By: Markus Daft M.D.   On: 02/18/2022 21:23   CT GUIDED PERITONEAL/RETROPERITONEAL FLUID DRAIN BY PERC CATH  Result Date: 02/18/2022 INDICATION: 81 year old with  recent abdominal surgeries including subtotal colectomy with an end ileostomy. Recent CT demonstrates intra-abdominal abscesses. Plan for CT-guided drainage of the intra-abdominal abscesses. EXAM: CT-guided drain placement in right lower abdominal/pelvic abscess CT-guided drain placement in left lower abdominal/pelvic abscess. MEDICATIONS: Moderate sedation ANESTHESIA/SEDATION: Moderate (conscious) sedation was employed during this procedure. A total of Versed 1.0mg  and fentanyl 75 mcg was administered intravenously at the order of the provider performing the procedure. Total intra-service moderate sedation time: 32 minutes. Patient's level of consciousness and vital signs were monitored continuously by radiology nurse throughout the procedure under the supervision of the provider performing the procedure. COMPLICATIONS: None immediate. PROCEDURE: Informed written consent was obtained from the patient after a thorough discussion of the procedural risks, benefits and alternatives. All questions were addressed. Maximal Sterile Barrier Technique was utilized including caps, mask, sterile gowns, sterile gloves, sterile drape, hand hygiene and skin antiseptic. A timeout was performed prior to the initiation of the procedure. Patient was placed supine on the CT scanner. Images through the lower abdomen and pelvis were obtained. Abscess collections situated between the right psoas and right iliacus muscle targeted. The right side of the lower abdomen was prepped with chlorhexidine and sterile field was created. Skin was anesthetized with 1% lidocaine. A small incision was made. Using CT guidance, an 18 gauge trocar needle was directed into the collection adjacent to the right psoas muscle. Superstiff Amplatz wire was placed but the wire went into smaller collection anterior to the larger abscess collection. As a result, this wire and needle were removed. A new incision was made in order to target the larger abscess  collection. Using CT guidance, the 18 gauge needle was directed into the abscess and yellow purulent fluid was aspirated. Superstiff Amplatz wire was placed. Tract was dilated to accommodate a 10 [place] multipurpose drain and approximately 20 mL of purulent fluid was removed. Drain was attached to a suction bulb and sutured to skin. Attention was directed to the abscess in the left lower abdomen. Left lower abdomen was prepped with chlorhexidine and sterile field was created. Skin was anesthetized using 1% lidocaine. Small incision was made. Using CT guidance, an 18 gauge trocar needle was directed into the air-fluid collection and purulent fluid was aspirated. Superstiff Amplatz wire was placed and the tract was dilated to accommodate a 10 [place] multipurpose drain. 20 mL of thick yellow purulent fluid was removed. Follow up CT images were obtained. Drain was attached to a suction bulb and sutured to skin. Fluid samples were sent for culture. RADIATION DOSE REDUCTION: This exam was performed according to the departmental dose-optimization  program which includes automated exposure control, adjustment of the mA and/or kV according to patient size and/or use of iterative reconstruction technique. FINDINGS: Multiple air-fluid collections in the right lower abdomen. Largest abscess was situated between the right psoas muscle and right iliacus muscle. Purulent fluid was removed from the largest collection. Drain was placed in the largest abscess collection. Drain was successfully placed in the air-fluid collection in the left lower abdomen adjacent to the left psoas muscle. 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 IMPRESSION: Successful placement of a CT-guided drain in the left lower abdominal abscess and CT-guided placement of a drain in  the right lower abdominal abscess. Electronically Signed   By: Markus Daft M.D.   On: 02/18/2022 21:23   CT Abdomen Pelvis W Contrast  Result Date: 02/17/2022 CLINICAL DATA:  Sigmoid hemicolectomy 01/20/2022. Repair of cecal perforation. Loop ileostomy creation. Drains removed last week. Abdominal pain. Elevated white blood cell count. EXAM: CT ABDOMEN AND PELVIS WITH CONTRAST TECHNIQUE: Multidetector CT imaging of the abdomen and pelvis was performed using the standard protocol following bolus administration of intravenous contrast. RADIATION DOSE REDUCTION: This exam was performed according to the departmental dose-optimization program which includes automated exposure control, adjustment of the mA and/or kV according to patient size and/or use of iterative reconstruction technique. CONTRAST:  119mL OMNIPAQUE IOHEXOL 300 MG/ML  SOLN COMPARISON:  02/01/2022 FINDINGS: Lower chest: Motion degradation within the lower chest. Tiny bibasilar pulmonary nodules are most likely incidental/benign and do not warrant imaging follow-up per consensus criteria. Example at maximally 3 mm.Mild cardiomegaly, without pericardial or pleural effusion. Hepatobiliary: Mild motion degradation continuing into the upper abdomen. Normal liver. The gallbladder is underdistended, without calcified stone or specific evidence of acute cholecystitis. Pancreas: Normal, without mass or ductal dilatation. Spleen: Normal in size, without focal abnormality. Adrenals/Urinary Tract: Normal adrenal glands. Normal kidneys, without hydronephrosis. Normal urinary bladder. Stomach/Bowel: Normal stomach, without wall thickening. Interval Hartmann's pouch creation and new complete colectomy. Right lower quadrant ileostomy again identified. Ill-defined right pelvic fluid and gas of 6.0 x 5.1 cm on 57/2 is contiguous with the rectal stump, new since 01/24/2022. ill-defined left pelvic fluid and gas collection measures 6.7 x 4.3 cm on 57/2, also likely  tracking from the rectal stump including on 63/2. Extends to the left adnexa and superior aspect of the left side of the vaginal cuff including on 66/2. More ill-defined gas within the ileocolic mesentery including on 50/2. Vascular/Lymphatic: Aortic atherosclerosis. 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 No abdominopelvic adenopathy. Reproductive: Hysterectomy.  No adnexal mass. Other: No free pelvic fluid. Pelvic midline laparotomy included on 66/2. Hyperattenuation material within could represent contrast. Gas just deep to the laparotomy site on 63/2 is intimately associated with the adjacent bowel loops. No well-defined fluid collection. There is also midline abdominal wall presumably laparotomy induced gas on 49/2. Musculoskeletal: No acute osseous abnormality. Grade 1 L3-4 anterolisthesis with advanced lumbosacral spondylosis. IMPRESSION: 1. Minimally motion degraded exam. 2. Interval near complete colectomy with rectal stump creation. Bilateral ill-defined gas and fluid collections within the pelvis, most consistent with abscesses. These track from the rectal stump site, and anastomotic leak cannot be excluded. 3. Abdominopelvic laparotomy sites. Gas deep to pelvic surgical sutures is intimately associated with adjacent bowel and fistulous communication cannot be excluded (especially given hyperattenuating material within the defect, possibly contrast).  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 Electronically Signed   By: Abigail Miyamoto M.D.   On: 02/17/2022 18:08   DG Chest 2 View  Result Date: 02/17/2022 CLINICAL DATA:  Suspected sepsis EXAM: CHEST - 2 VIEW COMPARISON:  11/29/2020 FINDINGS: Linear atelectasis in the right upper lobe. No confluent airspace opacities otherwise. Heart is normal size. Mediastinal contours within normal limits. Aortic atherosclerosis. No effusions or acute bony abnormality. IMPRESSION: Platelike right upper lobe atelectasis. No active disease. Electronically Signed   By: Rolm Baptise M.D.   On: 02/17/2022 17:09    Labs:  CBC: Recent Labs    02/18/22 1140 02/19/22 0509 02/19/22 1954 02/20/22 0545 03/13/22 1032  WBC 12.4* 10.7*  --  10.8* 8.5  HGB 7.7* 7.0* 8.4* 8.6* 9.2*  HCT 24.0* 22.1*  --  25.6* 28.7*  PLT 273 240  --  220 311    COAGS: Recent Labs    01/13/22 1138 01/18/22 2100 02/17/22 1630 02/18/22 0545  INR 1.0 1.2 1.3* 1.2  APTT 21* 29  --   --     BMP: Recent Labs    02/18/22 1140 02/19/22 0509 02/20/22 0545 03/13/22 1032  NA 134* 134* 135 130*  K 3.3* 3.2* 3.1* 3.9  CL 101 107 110 103  CO2 23 21* 19* 18*  GLUCOSE 91 104* 119* 89  BUN 8 9 10 19   CALCIUM 7.9* 7.7* 8.0* 8.4*  CREATININE 0.49 0.47 0.35* 1.00  GFRNONAA >60 >60 >60 56*    LIVER FUNCTION TESTS: Recent Labs    01/12/22 2051 01/18/22 0929 02/17/22 1630 03/13/22 1032  BILITOT 0.6 1.1 0.3 0.4  AST 18 29 12* 13*  ALT 15 27 9 8   ALKPHOS 102 75 77 88  PROT 6.1* 5.9* 5.4* 6.7  ALBUMIN 3.9 3.4* 2.0* 2.9*    TUMOR MARKERS: No results for input(s): "AFPTM", "CEA", "CA199", "CHROMGRNA" in the last 8760 hours.  Assessment and Plan:  81 yo female presents to IR for recurrent intra-abdominal fluid collection drain placements.   Pt resting in bed with husband and daughter at bedside.  She is A&O, calm and pleasant.  She is in no distress.  Pt states last Eliquis was 03/12/22.  Due to 4 dose hold, procedure tentatively scheduled for 03/15/22.   Risks and benefits discussed with the patient including bleeding, infection, damage to adjacent structures, bowel perforation/fistula connection, and sepsis.  All of the patient's questions were answered, patient is agreeable to proceed. Consent signed and in radiology APP office.   Thank you for this interesting consult.  I greatly enjoyed meeting JAELY SILMAN and look forward to participating in their care.  A copy of this report was sent to the requesting provider on this date.  Electronically Signed: Tyson Alias, NP 03/13/2022, 2:35 PM   I spent a total of 30 minutes in face to face in clinical consultation, greater than 50% of which was counseling/coordinating care for intra-abdominal fluid collections.

## 2022-03-13 NOTE — ED Provider Notes (Signed)
[facility] Provider Note    Event Date/Time   First MD Initiated Contact with Patient 03/13/22 1325     (approximate)   History   Abscess   HPI  Lindsey Thomas is a 81 y.o. female with a past medical history of emphysema, hypothyroidism, non-small cell lung cancer of the right lung status postradiation, recent hospitalization for sigmoid stricture with obstruction and cecal perforation status post sigmoidectomy with anastomosis and repair of perforation with protective loop ileostomy creation on 01/20/22, discharged on 8/26, readmission to the hospital on 9/13 with purulent discharge around the drainage site and found to have sepsis secondary to abdominal abscess, seen by both general surgery and IR who took patient for placement of lateral CT-guided drains on 9/14 and treated with broad-spectrum antibiotics with improvement of her symptoms and discharged on Cipro and Flagyl x7 days. Family reports JP drains removed approximately 10 days ago.  Patient had outpatient CT scan 2 days ago which revealed interval removal of the bilateral percutaneous pelvic drains with recurrent air-fluid collections which were suspicious for small abscesses.  No free air, bowel obstruction, or ileus.  She has stable nonocclusive bilateral femoral DVTs, on eliquis. No CP/SOB, or new skin changes to legs.  She returns today with increased drainage as well as a temperature last night of 101 F.  She reports that over the past 2 days she has had increased pus from the JP drains sites as well as increased pain.  She had a repeat CT scan 2 days ago which demonstrated small abscesses.  She had discussed with Dr. Peyton Najjar who had performed the surgery who according to family has been trying to arrange to have the JP drains replaced but has been unable to do so as an outpatient so far.  3 days ago she was started again on Flagyl and Bactrim.  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 ([place]) 03/13/2022   Surgical wound, non healing    Irritant contact dermatitis associated with fecal stoma    Ileostomy care ([place])    Anemia 02/18/2022   Pressure injury of skin 02/18/2022   Sepsis ([place]) secondary to postprocedural abdominal abscess 02/17/2022   Hypomagnesemia 01/26/2022   Hyperkalemia    Metabolic acidosis 64/40/3474   Hypotension 01/24/2022   Sinus tachycardia 01/22/2022   Hypoglycemia 01/20/2022   Abnormal thyroid function test    Hypokalemia    LBBB (left bundle branch block)    Bowel obstruction_sigmoid colon    Rectal bleeding 01/14/2022   Nausea 01/14/2022   Hyponatremia 01/14/2022   Splenic artery aneurysm ([place]) 01/14/2022   GERD (gastroesophageal reflux disease)    Asthma    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.0 to 39.9    Lung nodule    Lower limb ulcer, calf, left, limited to breakdown of skin ([place]) 05/08/2021   Acute deep vein thrombosis (DVT) of proximal vein of left lower extremity ([place]) 01/01/2021   Tendinitis of upper biceps tendon of right shoulder 12/15/2020   Rotator cuff tendinitis, right 12/15/2020   Dyspnea and respiratory abnormalities 02/18/2020   Lung cancer ([place]) 12/03/2018   Varicose veins of leg with pain, 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   Leg swelling 04/23/2016   Lymphedema 04/23/2016   Venous stasis 12/23/2015   Thyroid nodule 06/25/2015   Anxiety 12/31/2013   Aphasia 12/31/2013   Obesity 12/31/2013   Derangement of posterior horn  of medial meniscus 09/13/2013   Neck pain 09/13/2013   Pure hypercholesterolemia 09/13/2013   Disorder of bursae and tendons in shoulder region 09/13/2013          Physical Exam   Triage Vital Signs: ED Triage Vitals  Enc Vitals Group     BP 03/13/22 1029 (!) 106/54     Pulse Rate 03/13/22 1029 91     Resp 03/13/22 1029 20     Temp 03/13/22 1029 98.4 F (36.9 C)     Temp Source 03/13/22 1029  Oral     SpO2 03/13/22 1026 99 %     Weight 03/13/22 1027 158 lb 11.7 oz (72 kg)     Height 03/13/22 1027 4\' 9"  (1.448 m)     Head Circumference --      Peak Flow --      Pain Score 03/13/22 1026 8     Pain Loc --      Pain Edu? --      Excl. in [place]? --     Most recent vital signs: Vitals:   03/13/22 1414 03/13/22 1500  BP: (!) 100/52 (!) 106/44  Pulse: 98 97  Resp: 18 18  Temp: 98.2 F (36.8 C)   SpO2: 96% 98%    Physical Exam Vitals and nursing note reviewed.  Constitutional:      General: Awake and alert. No acute distress.    Appearance: Normal appearance. The patient is overweight.  HENT:     Head: Normocephalic and atraumatic.     Mouth: Mucous membranes are moist.  Eyes:     General: PERRL. Normal EOMs        Right eye: No discharge.        Left eye: No discharge.     Conjunctiva/sclera: Conjunctivae normal.  Cardiovascular:     Rate and Rhythm: Normal rate and regular rhythm.     Pulses: Normal pulses.     Heart sounds: Normal heart sounds Pulmonary:     Effort: Pulmonary effort is normal. No respiratory distress.     Breath sounds: Normal breath sounds.  Abdominal:     Abdomen is soft. There is mild abdominal tenderness at previous JP locations. Colostomy bag with liquid stool. No rebound or guarding. No distention.  Left-sided JP drain location with purulent drainage.  Midline incision with bloody gauze and tenderness, no active drainage noted Musculoskeletal:        General: No swelling. Normal range of motion.     Cervical back: Normal range of motion and neck supple.  Normal pedal pulses. No cerulea/phlegmasia dolens noted. Skin:    General: Skin is warm and dry.     Capillary Refill: Capillary refill takes less than 2 seconds.     Findings: No rash.  Neurological:     Mental Status: The patient is awake and alert.      ED Results / Procedures / Treatments   Labs (all labs ordered are listed, but only abnormal results are displayed) Labs  Reviewed  COMPREHENSIVE METABOLIC PANEL - Abnormal; Notable for the following components:      Result Value   Sodium 130 (*)    CO2 18 (*)    Calcium 8.4 (*)    Albumin 2.9 (*)    AST 13 (*)    GFR, Estimated 56 (*)    All other components within normal limits  CBC WITH DIFFERENTIAL/PLATELET - Abnormal; Notable for the following components:   RBC 3.27 (*)  Hemoglobin 9.2 (*)    HCT 28.7 (*)    RDW 16.8 (*)    Abs Immature Granulocytes 0.15 (*)    All other components within normal limits  PROTIME-INR - Abnormal; Notable for the following components:   Prothrombin Time 19.7 (*)    INR 1.7 (*)    All other components within normal limits  MAGNESIUM - Abnormal; Notable for the following components:   Magnesium 1.0 (*)    All other components within normal limits  CULTURE, BLOOD (ROUTINE X 2)  CULTURE, BLOOD (ROUTINE X 2)  LACTIC ACID, PLASMA  LACTIC ACID, PLASMA  PHOSPHORUS  URINALYSIS, ROUTINE W REFLEX MICROSCOPIC  VITAMIN B12  APTT  BASIC METABOLIC PANEL  CBC  MAGNESIUM     EKG     RADIOLOGY No repeat CT scan today per Dr. Hampton Abbot recommendations  I reviewed CT scan obtained 2 days ago.    PROCEDURES:  Critical Care performed:   .Critical Care  Performed by: Marquette Old, PA-C Authorized by: Marquette Old, PA-C   Critical care provider statement:    Critical care time (minutes):  35   Critical care was necessary to treat or prevent imminent or life-threatening deterioration of the following conditions:  Dehydration and circulatory failure (possible sepsis)   Critical care was time spent personally by me on the following activities:  Discussions with consultants, development of treatment plan with patient or surrogate, ordering and performing treatments and interventions, ordering and review of laboratory studies, ordering and review of radiographic studies, re-evaluation of patient's condition, review of old charts, obtaining history from patient or  surrogate, examination of patient and evaluation of patient's response to treatment   I assumed direction of critical care for this patient from another provider in my specialty: no     Care discussed with: admitting provider      MEDICATIONS ORDERED IN ED: Medications  acetaminophen (TYLENOL) tablet 650 mg (has no administration in time range)    Or  acetaminophen (TYLENOL) suppository 650 mg (has no administration in time range)  ondansetron (ZOFRAN) tablet 4 mg (has no administration in time range)    Or  ondansetron (ZOFRAN) injection 4 mg (has no administration in time range)  senna-docusate (Senokot-S) tablet 1 tablet (has no administration in time range)  metroNIDAZOLE (FLAGYL) IVPB 500 mg (0 mg Intravenous Stopped 03/13/22 1655)  fentaNYL (SUBLIMAZE) injection 25 mcg (has no administration in time range)  0.9 %  sodium chloride infusion (has no administration in time range)  ciprofloxacin (CIPRO) IVPB 400 mg (has no administration in time range)  ascorbic acid (VITAMIN C) tablet 500 mg (has no administration in time range)  zinc sulfate capsule 220 mg (has no administration in time range)  traMADol (ULTRAM) tablet 50 mg (has no administration in time range)  pantoprazole (PROTONIX) EC tablet 40 mg (has no administration in time range)  cyanocobalamin (VITAMIN B12) tablet 1,000 mcg (has no administration in time range)  cholecalciferol (VITAMIN D3) 25 MCG (1000 UNIT) tablet 5,000 Units (has no administration in time range)  feeding supplement (ENSURE ENLIVE / ENSURE PLUS) liquid 237 mL (237 mLs Oral Not Given 03/13/22 1553)  montelukast (SINGULAIR) tablet 10 mg (has no administration in time range)  mometasone-formoterol (DULERA) 200-5 MCG/ACT inhaler 2 puff (has no administration in time range)  LORazepam (ATIVAN) tablet 0.5 mg (has no administration in time range)  magnesium sulfate IVPB 2 g 50 mL (has no administration in time range)  ciprofloxacin (CIPRO) IVPB 400 mg (0  mg  Intravenous Stopped 03/13/22 1540)  sodium chloride 0.9 % bolus 1,000 mL (0 mLs Intravenous Stopped 03/13/22 1656)  sodium chloride 0.9 % bolus 500 mL (500 mLs Intravenous New Bag/Given 03/13/22 1658)     IMPRESSION / MDM / ASSESSMENT AND PLAN / ED COURSE  I reviewed the triage vital signs and the nursing notes.   Differential diagnosis includes, but is not limited to, recurrent abdominal/pelvic abscess, enlarging abscess, cellulitis, sepsis/sirs.  Patient is awake and alert, mildly tachycardic to 101 on arrival with soft blood pressure of 474 systolic.  She is afebrile without taking antipyretics today.  Labs obtained in triage are overall reassuring.  Lactate is normal.  Given history of fever at home, blood cultures were sent.  I reviewed her CT imaging from 2 days ago which reveals developing bilateral pelvic abscesses at the location of previous JP drain sites.  I discussed with Dr. Hampton Abbot who agrees with plan for admission, IV antibiotics, and discussion with interventional radiology for replacement of JP drains.  I subsequently discussed with Dr. Annamaria Boots with interventional radiology who has agreed with JP drain placement and will arrange for this to be done.  In the meantime, patient was started on Cipro and Flagyl per Dr. Mont Dutton recommendations.  She was also given a normal saline bolus given her tachycardia and hypotension.  She has known bilateral DVTs of her lower extremities for which she is taking Eliquis.  She denies chest pain or shortness of breath to suggest pulmonary embolism.  No hypoxia noted.  Eliquis will be held on admission, and heparin drip will be initiated by the hospitalist team prior to her IR procedure.  She and her family agree with plan for admission and JP drain placement.  She was excepted by Dr. Tobie Poet with the hospitalist team.   Patient's presentation is most consistent with acute presentation with potential threat to life or bodily function.   Clinical Course as  of 03/13/22 1705  Sat Mar 13, 2022  1347 General Surgery paged [JP]  1403 Discussed with Dr. Hampton Abbot, recommends discussing JP drain placement with IR and admission to the hospitalist service [JP]  1403 IR paged [JP]  1409 Discussed with Dr. Annamaria Boots with interventional radiology who agrees with admission to the hospitalist and IV abx and will figure out a time to do the JP drain insertion [JP]  1430 Discussed with Dr. Tobie Poet who has agreed to admit [JP]    Clinical Course User Index [JP] Coreena Rubalcava, Clarnce Flock, PA-C     FINAL CLINICAL IMPRESSION(S) / ED DIAGNOSES   Final diagnoses:  Intra-abdominal abscess ([place])     Rx / DC Orders   ED Discharge Orders     None        Note:  This document was prepared using Dragon voice recognition software and may include unintentional dictation errors.   Emeline Gins 03/13/22 1705    Nena Polio, MD 03/13/22 [phone]

## 2022-03-13 NOTE — ED Notes (Signed)
Overdue mag sulfate not verified by pharmacy.

## 2022-03-13 NOTE — Assessment & Plan Note (Addendum)
-   On admission - Vitamin C 500 mg p.o. twice daily, zinc sulfate 220 mg p.o. daily ordered - Wound care and wound nursing consulted

## 2022-03-13 NOTE — Assessment & Plan Note (Signed)
-   Lorazepam 0.5 mg p.o. every 6 hours as needed for anxiety, 2 doses ordered

## 2022-03-13 NOTE — Progress Notes (Signed)
ANTICOAGULATION CONSULT NOTE  Pharmacy Consult for IV heparin Indication: DVT  Allergies  Allergen Reactions   Eryc [Erythromycin]    Levofloxacin Nausea Only   Percocet [Oxycodone-Acetaminophen] Nausea And Vomiting   Augmentin [Amoxicillin-Pot Clavulanate] Rash   Celecoxib Palpitations    Patient Measurements: Height: 4\' 9"  (144.8 cm) Weight: 72 kg (158 lb 11.7 oz) IBW/kg (Calculated) : 38.6 Heparin Dosing Weight: 55.4 kg  Vital Signs: Temp: 98.2 F (36.8 C) (10/07 1414) Temp Source: Oral (10/07 1414) BP: 97/70 (10/07 1630) Pulse Rate: 94 (10/07 1645)  Labs: Recent Labs    03/13/22 1032 03/13/22 1346  HGB 9.2*  --   HCT 28.7*  --   PLT 311  --   LABPROT  --  19.7*  INR  --  1.7*  CREATININE 1.00  --     Estimated Creatinine Clearance: 35 mL/min (by C-G formula based on SCr of 1 mg/dL).   Medical History: Past Medical History:  Diagnosis Date   Anxiety    Arthritis    Asthma    DVT (deep venous thrombosis) (HCC)    Edema    feet   Emphysema of lung (HCC)    GERD (gastroesophageal reflux disease)    Headache    MIGRAINES   History of orthopnea    Hypercholesterolemia    Hypothyroidism    NODULES   Lymphedema    Migraine    Non-small cell lung cancer, right ([place]) 05/2019   Rad tx's   Shortness of breath dyspnea    WITH EXERTION   Sleep apnea    MILD, DOES NOT USE CPAP   Thyroid nodule     Medications:  Last dose of Eliquis 03/12/22 @2000 .  Assessment: 81 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 Pt has IR procedure planned for 10/9 and pharmacy has been consulted for heparin in the perioperative period. Pt on apixaban currently for bilateral DVT from 03/04/2022.  BL aPTT 43. H&H stable. Hgb consistent with baseline. BL INR elevated at 1.7.  Goal of Therapy:  aPTT 66-102 seconds Monitor platelets by anticoagulation protocol: Yes    Plan:  No bolus given low baseline Hgb, elevated INR, and recent Eliquis dose for a previous DVT. Start heparin infusion at 900 units/hr aPTT in 8 hours CBC daily   Wynelle [place] 03/13/2022,5:17 PM

## 2022-03-13 NOTE — Progress Notes (Signed)
Pharmacy Antibiotic Note  Lindsey Thomas is a 81 y.o. female admitted on 03/13/2022 with  intraabdominal infection . Recent small bowel obstruction complicated by perforation s/p colectomy and ileostomy with abscess. Pharmacy has been consulted for ciprofloxacin dosing.  Renal function above baseline of ~0.5 with Scr of 1.00 on 03/13/22, though CrCl remains > 30 mL/min.  Plan: Start ciprofloxacin IV 400 mg every 12 hours  Also on metronidazole 500 mg every 12 hours  Follow renal function and clinical course. Height: 4\' 9"  (144.8 cm) Weight: 72 kg (158 lb 11.7 oz) IBW/kg (Calculated) : 38.6  Temp (24hrs), Avg:98.3 F (36.8 C), Min:98.2 F (36.8 C), Max:98.4 F (36.9 C)  Recent Labs  Lab 03/13/22 1032  WBC 8.5  CREATININE 1.00  LATICACIDVEN 1.4    Estimated Creatinine Clearance: 35 mL/min (by C-G formula based on SCr of 1 mg/dL).    Allergies  Allergen Reactions   Eryc [Erythromycin]    Levofloxacin Nausea Only   Percocet [Oxycodone-Acetaminophen] Nausea And Vomiting   Augmentin [Amoxicillin-Pot Clavulanate] Rash   Celecoxib Palpitations    Antimicrobials this admission: ciprofloxacin 10/7 >>   Dose adjustments this admission: N/a  Microbiology results: 10/7 BCx: in process  Thank you for allowing pharmacy to be a part of this patient's care.   Wynelle Cleveland 03/13/2022 2:35 PM

## 2022-03-13 NOTE — Hospital Course (Addendum)
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  Patient was treated with fluids and antibiotics and discharged.  Patient returned on 10/7 with complaints of abdominal pain and found to have recurrent pelvic abscess.  Status post drain placed by interventional radiology on 10/9.  Treated with IV antibiotics.  By 10/12, patient feeling better and tolerating p.o. and felt to be stable for discharge from medical and surgical standpoint.

## 2022-03-13 NOTE — ED Notes (Signed)
Rn unable to get 1st set of blood cultures

## 2022-03-13 NOTE — Assessment & Plan Note (Addendum)
-   Per general surgery request, we will continue with ciprofloxacin and metronidazole - Status post sodium chloride 1 L bolus - Ordered additional sodium chloride 500 mL bolus on admission - Continue with sodium chloride 125 mL/h, 1 day ordered - Given recurrent intra-abdominal infection, I have added vitamin C 500 mg twice daily and zinc 220 mg p.o. twice daily as patient may have poor p.o. intake this causing lack of nutrition which puts her at risk for poor wound healing - Check phosphorus, magnesium, B12, if low, would recommend a.m. team to replace - Blood culture x2 have been collected and are in process - IR has been consulted by EDP for JP drainage - IR plan to JP drain on Monday - Patient will need to be n.p.o. after midnight on Sunday - Admit to telemetry medical, observation

## 2022-03-13 NOTE — Consult Note (Signed)
Date of Consultation:  03/13/2022  Requesting Provider:  Sheran Luz, PA-C  Reason for Consultation: Intra-abdominal abscess and wound drainage  History of Present Illness: Lindsey Thomas is a 81 y.o. 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 Windell Moment on 01/20/2022.  Patient had complication of colorectal anastomotic leak, resulting in a subtotal colectomy with end ileostomy creation.  She was admitted recently on 02/17/2022 due to drainage from the lower portion of her midline wound and from the left lower quadrant wound where a prior JP drain had been located.  CT scan at that time showed 2 new fluid collections in the pelvis and she underwent IR drainage of 2 separate fluid collections.  The drains were removed by radiology on 9/28.  At that time, the patient was also found to have bilateral lower extremity DVTs and was started on Eliquis.  She has a follow-up appointment with Dr. Delana Meyer on 03/15/2022.  She saw Dr. Windell Moment in the office on 03/09/2022 and the day after also was having more drainage from her prior JP drain site and having low-grade temperatures.  A CT scan of the abdomen pelvis was obtained again on 03/11/2022 which showed small recurrent air-fluid collections in both sides of the pelvis concerning again for abscesses.  The patient had an order placed for interventional radiology to get new drain set up, but the patient last night started having a higher temperature of 101.7 and this morning the patient started having bloody purulent drainage from the lower midline wound.  Denies any nausea, vomiting.  She does report that the end ileostomy output has been more liquidy.  They have tried Imodium in the past which had been helping but more recently the stool has been very liquid.  She has also been having issues with  ostomy appliance leakage and there is excoriation of the skin in the surrounding area.  As such, the patient was advised to present to the emergency room for further evaluation.  Work-up in the emergency room shows a white blood cell count of 8.7, hemoglobin 9.2, creatinine of 1.0 which is higher than her usual of around 0.5.  Past Medical History: Past Medical History:  Diagnosis Date   Anxiety    Arthritis    Asthma    DVT (deep venous thrombosis) (HCC)    Edema    feet   Emphysema of lung (HCC)    GERD (gastroesophageal reflux disease)    Headache    MIGRAINES   History of orthopnea    Hypercholesterolemia    Hypothyroidism    NODULES   Lymphedema    Migraine    Non-small cell lung cancer, right ([place]) 05/2019   Rad tx's   Shortness of breath dyspnea    WITH EXERTION   Sleep apnea    MILD, DOES NOT USE CPAP   Thyroid nodule      Past Surgical History: Past Surgical History:  Procedure Laterality Date   ABDOMINAL HYSTERECTOMY     BREAST BIOPSY     CARDIAC CATHETERIZATION     CATARACT EXTRACTION W/PHACO Left 02/12/2016   Procedure: CATARACT EXTRACTION PHACO AND INTRAOCULAR LENS PLACEMENT ([place]);  Surgeon: Eulogio Bear, MD;  Location: [facility] ORS;  Service: Ophthalmology;  Laterality: Left;  [place] 01:30 AP% 15.2 CDE 13.71 Fluid pack lot # [id number]   CATARACT EXTRACTION W/PHACO Right 03/18/2016  Procedure: CATARACT EXTRACTION PHACO AND INTRAOCULAR LENS PLACEMENT (I[facility]);  Surgeon: Eulogio Bear, MD;  Location: [facility];  Service: Ophthalmology;  Laterality: Right;  Lot # [id number] H [place]: 01:05.5 AP%:14.3 CDE: 9.33   COLECTOMY WITH COLOSTOMY CREATION/HARTMANN PROCEDURE N/A 01/20/2022   Procedure: COLECTOMY WITH COLOSTOMY CREATION/HARTMANN PROCEDURE;  Surgeon: Herbert Pun, MD;  Location: [facility] OR[facility];  Service: General;  Laterality: N/A;   FLEXIBLE SIGMOIDOSCOPY N/A 01/14/2022   Procedure: FLEXIBLE SIGMOIDOSCOPY;  Surgeon: Toledo, Benay Pike, MD;  Location: [facility];   Service: Gastroenterology;  Laterality: N/A;   FLEXIBLE SIGMOIDOSCOPY N/A 01/19/2022   Procedure: FLEXIBLE SIGMOIDOSCOPY;  Surgeon: Lesly Rubenstein, MD;  Location: [facility];  Service: Endoscopy;  Laterality: N/A;   FRACTURE SURGERY Left    arm rod and screw   IR RADIOLOGIST EVAL & MGMT  03/04/2022   KNEE ARTHROSCOPY     LAPAROTOMY N/A 01/24/2022   Procedure: EXPLORATORY LAPAROTOMY;  Surgeon: Herbert Pun, MD;  Location: [facility] OR[facility];  Service: General;  Laterality: N/A;   OOPHORECTOMY     RCR     ROTATOR CUFF REPAIR Left 2006    Home Medications: Prior to Admission medications   Medication Sig Start Date End Date Taking? Authorizing Provider  acetaminophen (TYLENOL) 500 MG tablet Take 1,000 mg by mouth daily as needed for pain.    [provider]  aspirin EC 81 MG tablet Take 81 mg by mouth daily.    [provider]  calcium carbonate (CALCIUM ANTACID) 500 MG chewable tablet Chew 2 tablets by mouth daily.    [provider]  Cholecalciferol (VITAMIN D PO) Take 5,000 Units by mouth daily.    [provider]  Cyanocobalamin (VITAMIN B-12 PO) Take 1,000 mcg by mouth daily.    [provider]  feeding supplement (ENSURE ENLIVE / ENSURE PLUS) LIQD Take 237 mLs by mouth 3 (three) times daily between meals. 01/30/22   [name], MD  fluticasone (FLONASE) 50 MCG/ACT nasal spray Place 2 sprays into both nostrils daily. 07/26/18   [provider]  furosemide (LASIX) 20 MG tablet Take 20 mg by mouth daily. 10/10/17   [provider]  lidocaine (XYLOCAINE) 2 % solution Use as directed 15 mLs in the mouth or throat every 8 (eight) hours as needed for mouth pain. 01/30/22   [name], MD  loperamide (IMODIUM) 2 MG capsule Take 2 capsules (4 mg total) by mouth every 12 (twelve) hours. 01/30/22   [name], MD  magic mouthwash SOLN Take 5 mLs by mouth 4 (four) times daily. 01/30/22   [name], MD  meloxicam (MOBIC) 7.5 MG tablet  Take 1 tablet by mouth daily. 10/15/21   [provider]  metoprolol tartrate (LOPRESSOR) 25 MG tablet Take 1 tablet (25 mg total) by mouth 2 (two) times daily. 02/20/22   Annita Brod, MD  montelukast (SINGULAIR) 10 MG tablet Take 10 mg by mouth at bedtime.     [provider]  Multiple Vitamins-Minerals (PRESERVISION AREDS PO) Take 1 capsule by mouth 2 (two) times daily.    [provider]  nystatin (MYCOSTATIN/NYSTOP) powder Apply topically 3 (three) times daily. 01/30/22   [name], MD  omeprazole (PRILOSEC) 20 MG capsule Take 20 mg by mouth 2 (two) times daily before a meal.    [provider]  ondansetron (ZOFRAN) 4 MG tablet Take 1 tablet (4 mg total) by mouth daily as needed for nausea or vomiting. 01/15/22 01/15/23  Enzo Bi, MD  Polyethyl Glycol-Propyl Glycol (  SYSTANE) 0.4-0.3 % SOLN Apply 1 drop to eye 2 (two) times daily.    [provider]  SYMBICORT 160-4.5 MCG/ACT inhaler Inhale 2 puffs into the lungs 2 (two) times daily. 06/19/21   [provider]  traMADol (ULTRAM) 50 MG tablet Take 1 tablet (50 mg total) by mouth every 6 (six) hours as needed for severe pain or moderate pain. 01/30/22   [name], MD    Allergies: Allergies  Allergen Reactions   Eryc [Erythromycin]    Levofloxacin Nausea Only   Percocet [Oxycodone-Acetaminophen] Nausea And Vomiting   Augmentin [Amoxicillin-Pot Clavulanate] Rash   Celecoxib Palpitations    Social History:  reports that she has never smoked. She has never used smokeless tobacco. She reports that she does not drink alcohol and does not use drugs.   Family History: Family History  Problem Relation Age of Onset   Emphysema Mother    Heart Problems Mother    Tuberculosis Father    Brain cancer Father    Skin cancer Father    Breast cancer Sister    Irritable bowel syndrome Sister    Lung cancer Brother    Dementia Sister    Schizophrenia Sister     Review of  Systems: Review of Systems  Constitutional:  Positive for fever. Negative for chills.  HENT:  Negative for hearing loss.   Respiratory:  Negative for shortness of breath.   Cardiovascular:  Negative for chest pain.  Gastrointestinal:  Positive for abdominal pain and diarrhea. Negative for nausea and vomiting.  Genitourinary:  Negative for dysuria.  Musculoskeletal:  Negative for myalgias.  Skin:  Positive for rash.  Neurological:  Negative for dizziness.  Psychiatric/Behavioral:  Negative for depression.     Physical Exam BP (!) 106/44   Pulse 97   Temp 98.2 F (36.8 C) (Oral)   Resp 18   Ht 4\' 9"  (1.448 m)   Wt 72 kg   SpO2 98%   BMI 34.35 kg/m  CONSTITUTIONAL: No acute distress HEENT:  Normocephalic, atraumatic, extraocular motion intact. NECK: Trachea is midline, and there is no jugular venous distension. RESPIRATORY:  Normal respiratory effort without pathologic use of accessory muscles. CARDIOVASCULAR: Regular rhythm and rate. GI: The abdomen is soft, nondistended, with some tenderness to palpation at the low portion of the midline wound which is open of about 1.5 x 2.5 cm.  This portion was probed with a Q-tip and the tract extends in the subcutaneous space but does not appear to penetrate the fascia.  The cavity does track superiorly under the skin for about 5 cm.  This area was clean and packed with gauze dry gauze and secured with tape.  The left lower quadrant wound from a prior JP drain also has some purulent fluid.  Q-tip was used to probe and this was very superficial of about 0.5 cm depth.  The right lower quadrant ileostomy is patent with bilious stool in the bag.  There is a little bit of leakage ongoing in the medial aspect.  The surrounding skin particularly inferior to the ostomy, has a rash consistent with stool exposure/excoriation.  MUSCULOSKELETAL:  Normal muscle strength and tone in all four extremities.  No peripheral edema or cyanosis. SKIN: Skin turgor is  normal. There are no pathologic skin lesions.  NEUROLOGIC:  Motor and sensation is grossly normal.  Cranial nerves are grossly intact. PSYCH:  Alert and oriented to person, place and time. Affect is normal.  Laboratory Analysis: Results for orders placed or  performed during the hospital encounter of 03/13/22 (from the past 24 hour(s))  Comprehensive metabolic panel     Status: Abnormal   Collection Time: 03/13/22 10:32 AM  Result Value Ref Range   Sodium 130 (L) 135 - 145 mmol/L   Potassium 3.9 3.5 - 5.1 mmol/L   Chloride 103 98 - 111 mmol/L   CO2 18 (L) 22 - 32 mmol/L   Glucose, Bld 89 70 - 99 mg/dL   BUN 19 8 - 23 mg/dL   Creatinine, Ser 1.00 0.44 - 1.00 mg/dL   Calcium 8.4 (L) 8.9 - 10.3 mg/dL   Total Protein 6.7 6.5 - 8.1 g/dL   Albumin 2.9 (L) 3.5 - 5.0 g/dL   AST 13 (L) 15 - 41 U/L   ALT 8 0 - 44 U/L   Alkaline Phosphatase 88 38 - 126 U/L   Total Bilirubin 0.4 0.3 - 1.2 mg/dL   GFR, Estimated 56 (L) >60 mL/min   Anion gap 9 5 - 15  Lactic acid, plasma     Status: None   Collection Time: 03/13/22 10:32 AM  Result Value Ref Range   Lactic Acid, Venous 1.4 0.5 - 1.9 mmol/L  CBC with Differential     Status: Abnormal   Collection Time: 03/13/22 10:32 AM  Result Value Ref Range   WBC 8.5 4.0 - 10.5 K/uL   RBC 3.27 (L) 3.87 - 5.11 MIL/uL   Hemoglobin 9.2 (L) 12.0 - 15.0 g/dL   HCT 28.7 (L) 36.0 - 46.0 %   MCV 87.8 80.0 - 100.0 fL   MCH 28.1 26.0 - 34.0 pg   MCHC 32.1 30.0 - 36.0 g/dL   RDW 16.8 (H) 11.5 - 15.5 %   Platelets 311 150 - 400 K/uL   nRBC 0.0 0.0 - 0.2 %   Neutrophils Relative % 75 %   Neutro Abs 6.4 1.7 - 7.7 K/uL   Lymphocytes Relative 9 %   Lymphs Abs 0.8 0.7 - 4.0 K/uL   Monocytes Relative 11 %   Monocytes Absolute 0.9 0.1 - 1.0 K/uL   Eosinophils Relative 2 %   Eosinophils Absolute 0.2 0.0 - 0.5 K/uL   Basophils Relative 1 %   Basophils Absolute 0.1 0.0 - 0.1 K/uL   Immature Granulocytes 2 %   Abs Immature Granulocytes 0.15 (H) 0.00 - 0.07 K/uL   Protime-INR     Status: Abnormal   Collection Time: 03/13/22  1:46 PM  Result Value Ref Range   Prothrombin Time 19.7 (H) 11.4 - 15.2 seconds   INR 1.7 (H) 0.8 - 1.2  Lactic acid, plasma     Status: None   Collection Time: 03/13/22  3:40 PM  Result Value Ref Range   Lactic Acid, Venous 1.0 0.5 - 1.9 mmol/L  Magnesium     Status: Abnormal   Collection Time: 03/13/22  3:40 PM  Result Value Ref Range   Magnesium 1.0 (L) 1.7 - 2.4 mg/dL  Phosphorus     Status: None   Collection Time: 03/13/22  3:40 PM  Result Value Ref Range   Phosphorus 3.4 2.5 - 4.6 mg/dL    Imaging: DG Chest 2 View  Result Date: 03/13/2022 CLINICAL DATA:  Suspected sepsis EXAM: CHEST - 2 VIEW COMPARISON:  Chest x-ray February 17, 2022 FINDINGS: Stable cardiomediastinal contours. Unchanged calcified atherosclerosis of the aortic arch. Both lungs are clear. The visualized skeletal structures are unremarkable. IMPRESSION: No acute cardiopulmonary abnormality. Electronically Signed   By: Monica Becton.D.  On: 03/13/2022 10:56    Assessment and Plan: This is a 81 y.o. 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  - Discussed with patient the findings on the CT scan again and the findings on her wounds on today's exam.  Discussed with her that the plan would be for admission to the hospitalist team with consultation of interventional radiology for placement of new drains in both small abscesses.  The emergency room team has contacted interventional radiology and given that she has been on Eliquis, will wait till Monday for drain placement.  For now, it is okay for her to have a diet.  She can have Imodium as needed for her diarrhea.  Recommend starting her on IV antibiotics given the recurrent abscesses.  She does have a penicillin allergy so Cipro and Flagyl would be appropriate.  I have placed an order for the wound ostomy nurse to assess the patient given the leakage around  her ostomy appliance which the patient has been having a difficult time since surgery.  I have also placed dressing orders for the 2 wounds with packing of the lower midline wound with half-inch iodoform gauze and dry gauze dressing over the left lower quadrant wound. - I discussed this plan with the patient and her family and all of their questions have been answered.  Greatly appreciate the hospitalist team admitting this patient and helping with her care. - Dr. Windell Moment will see the patient tomorrow and he has been updated about my evaluation today.  I spent 60 minutes dedicated to the care of this patient on the date of this encounter to include pre-visit review of records, face-to-face time with the patient discussing diagnosis and management, and any post-visit coordination of care.   Melvyn Neth, MD [facility] Pg:  [phone]

## 2022-03-13 NOTE — ED Triage Notes (Signed)
Pt to ED via POV from home. Pt had colectomy 8 weeks. Pt has had issues since. Pt had CT scan  and followed up with MD on Thursday and showed abscess where JP drain was pulled out. Lindsey Thomas of pt's incisions had bloody pus. Pt had 101.7 fever at home. Pt currently on bactrim and flagyl.

## 2022-03-13 NOTE — Assessment & Plan Note (Signed)
-   Magnesium 2 g IV one-time dose ordered - Check magnesium in the a.m.

## 2022-03-13 NOTE — H&P (Addendum)
History and Physical   Lindsey Thomas BJS:[id number] DOB: 09-Feb-1941 DOA: 03/13/2022  PCP: Idelle Crouch, MD  Outpatient Specialists: Dr. Windell Moment, general surgery Patient coming from: Home via [place]  I have personally briefly reviewed patient's old medical records in [place].  Chief Concern: Intra-abdominal infection  HPI: Ms. 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  Initial vitals in the emergency department showed temperature of 98.4, respiration rate of 20, heart rate of 91, blood pressure 106/54, SPO2 of 95% on room air.  Serum sodium is 130, potassium 3.9, chloride 103, bicarb 18, BUN of 19, serum creatinine 1.00, nonfasting blood glucose 89, GFR 56, WBC 8.5, hemoglobin 9.6, platelets of 311.  Lactic acid was 1.1.  ED treatment: Ciprofloxacin 400 mg IV, metronidazole 500 mg IV, sodium chloride 1 L bolus.  EDP discussed patient's case with Dr. Hampton Abbot who recommends admission for IV antibiotics and contacting interventional radiology.  EDP spoke with interventional radiology, Dr. Annamaria Boots who recommends hospitalist admission for IV antibiotics and they will determine when another JP drain can be placed. ------------------------------- At bedside, she is able to tell me her name, age, current year, current location.  She does not appear to be in acute distress.  Patient and daughter at bedside reports purulence at the left abdominal incision started on Sunday, 03/07/22.  She reports there is mixed of blood and purulence.  The suprapubic incision started leaking blood purulence today, prompting family present for further evaluation.  To prsent to the ED  She had fever of approximately 39 F yesterday  that resolved with OTC acetaminophen.   She denies dysuria. She watery stool  in the colostomy bag for about 1 week.  She denies chest pain, shortness of breath, syncope, chest pain.  She endorses approximately 25 to 30 pound weight loss since her abdominal issues and multi hospitalization has started.  She denies trauma to her person.  Social history: Patient lives at home with her husband.  She denies tobacco, EtOH, recreational drug use.  ROS: Constitutional: + weight change, no fever ENT/Mouth: no sore throat, no rhinorrhea Eyes: no eye pain, no vision changes Cardiovascular: no chest pain, no dyspnea,  no edema, no palpitations Respiratory: no cough, no sputum, no wheezing Gastrointestinal: no nausea, no vomiting, no diarrhea, no constipation Genitourinary: no urinary incontinence, no dysuria, no hematuria Musculoskeletal: no arthralgias, no myalgias Skin: + skin lesions with purulence and bloody drainage, no pruritus Neuro: + weakness, no loss of consciousness, no syncope Psych: no anxiety, no depression, + decrease appetite Heme/Lymph: no bruising, no bleeding  ED Course: Discussed with emergency medicine provider, patient requiring hospitalization for chief concerns of intra-abdominal infection.  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 twice daily as patient may have poor p.o. intake this causing lack of nutrition which puts her at risk for poor wound healing - Check phosphorus, magnesium, B12, if low, would recommend a.m. team to replace - Blood culture x2 have been collected and are in process - IR has been consulted by EDP for JP drainage - IR plan to JP drain on Monday - Patient will need to be n.p.o. after midnight on Sunday - Admit to telemetry medical, observation  Pressure injury of skin - On admission - Vitamin C 500 mg p.o. twice daily, zinc sulfate 220 mg p.o. daily ordered - Wound care and wound nursing consulted  Hyponatremia - Secondary to p.o. intake - Sodium chloride 1.5 L bolus in the ED - BMP in the a.m.  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  Anxiety - Lorazepam 0.5 mg p.o. every 6 hours as needed for anxiety, 2 doses ordered  Chart reviewed.   DVT prophylaxis: Heparin GGT Code Status: full code Diet: Heart healthy Family Communication: [name], daughter and Herb, husband are in the room with patient's permission Disposition Plan: Pending clinical course Consults called: General surgery, interventional radiology Admission status: Telemetry medical, observation  Past Medical History:  Diagnosis Date   Anxiety    Arthritis    Asthma    DVT (deep venous thrombosis) (HCC)    Edema     feet   Emphysema of lung (HCC)    GERD (gastroesophageal reflux disease)    Headache    MIGRAINES   History of orthopnea    Hypercholesterolemia    Hypothyroidism    NODULES   Lymphedema    Migraine    Non-small cell lung cancer, right ([place]) 05/2019   Rad tx's   Shortness of breath dyspnea    WITH EXERTION   Sleep apnea    MILD, DOES NOT USE CPAP   Thyroid nodule    Past Surgical History:  Procedure Laterality Date   ABDOMINAL HYSTERECTOMY     BREAST BIOPSY     CARDIAC CATHETERIZATION     CATARACT EXTRACTION W/PHACO Left 02/12/2016   Procedure: CATARACT EXTRACTION PHACO AND INTRAOCULAR LENS PLACEMENT ([place]);  Surgeon: Eulogio Bear, MD;  Location: [facility] ORS;  Service: Ophthalmology;  Laterality: Left;  [place] 01:30 AP% 15.2 CDE 13.71 Fluid pack lot # [id number]   CATARACT EXTRACTION W/PHACO Right 03/18/2016   Procedure: CATARACT EXTRACTION PHACO AND INTRAOCULAR LENS PLACEMENT ([facility]);  Surgeon: Eulogio Bear, MD;  Location: [facility];  Service: Ophthalmology;  Laterality: Right;  Lot # [id number] H [place]: 01:05.5 AP%:14.3 CDE: 9.33   COLECTOMY WITH COLOSTOMY CREATION/HARTMANN PROCEDURE N/A 01/20/2022   Procedure: COLECTOMY WITH COLOSTOMY CREATION/HARTMANN PROCEDURE;  Surgeon: Herbert Pun, MD;  Location: [facility] OR[facility];  Service: General;  Laterality: N/A;   FLEXIBLE SIGMOIDOSCOPY N/A  01/14/2022   Procedure: FLEXIBLE SIGMOIDOSCOPY;  Surgeon: Toledo, Benay Pike, MD;  Location: [facility];  Service: Gastroenterology;  Laterality: N/A;   FLEXIBLE SIGMOIDOSCOPY N/A 01/19/2022   Procedure: FLEXIBLE SIGMOIDOSCOPY;  Surgeon: Lesly Rubenstein, MD;  Location: [facility];  Service: Endoscopy;  Laterality: N/A;   FRACTURE SURGERY Left    arm rod and screw   IR RADIOLOGIST EVAL & MGMT  03/04/2022   KNEE ARTHROSCOPY     LAPAROTOMY N/A 01/24/2022   Procedure: EXPLORATORY LAPAROTOMY;  Surgeon: Herbert Pun, MD;  Location: [facility] OR[facility];  Service: General;  Laterality: N/A;    OOPHORECTOMY     RCR     ROTATOR CUFF REPAIR Left 2006   Social History:  reports that she has never smoked. She has never used smokeless tobacco. She reports that she does not drink alcohol and does not use drugs.  Allergies  Allergen Reactions   Eryc [Erythromycin]    Levofloxacin Nausea Only   Percocet [Oxycodone-Acetaminophen] Nausea And Vomiting   Augmentin [Amoxicillin-Pot Clavulanate] Rash   Celecoxib Palpitations   Family History  Problem Relation Age of Onset   Emphysema Mother    Heart Problems Mother    Tuberculosis Father    Brain cancer Father    Skin cancer Father    Breast cancer Sister    Irritable bowel syndrome Sister    Lung cancer Brother    Dementia Sister    Schizophrenia Sister    Family history: Family history reviewed and not pertinent  Prior to Admission medications   Medication Sig Start Date End Date Taking? Authorizing Provider  acetaminophen (TYLENOL) 500 MG tablet Take 1,000 mg by mouth daily as needed for pain.    [provider]  aspirin EC 81 MG tablet Take 81 mg by mouth daily.    [provider]  calcium carbonate (CALCIUM ANTACID) 500 MG chewable tablet Chew 2 tablets by mouth daily.    [provider]  Cholecalciferol (VITAMIN D PO) Take 5,000 Units by mouth daily.    [provider]  Cyanocobalamin (VITAMIN B-12 PO) Take 1,000 mcg by mouth daily.    [provider]  feeding supplement (ENSURE ENLIVE / ENSURE PLUS) LIQD Take 237 mLs by mouth 3 (three) times daily between meals. 01/30/22   [name], MD  fluticasone (FLONASE) 50 MCG/ACT nasal spray Place 2 sprays into both nostrils daily. 07/26/18   [provider]  furosemide (LASIX) 20 MG tablet Take 20 mg by mouth daily. 10/10/17   [provider]  lidocaine (XYLOCAINE) 2 % solution Use as directed 15 mLs in the mouth or throat every 8 (eight) hours as needed for mouth pain. 01/30/22   [name], MD  loperamide (IMODIUM) 2 MG  capsule Take 2 capsules (4 mg total) by mouth every 12 (twelve) hours. 01/30/22   [name], MD  magic mouthwash SOLN Take 5 mLs by mouth 4 (four) times daily. 01/30/22   [name], MD  meloxicam (MOBIC) 7.5 MG tablet Take 1 tablet by mouth daily. 10/15/21   [provider]  metoprolol tartrate (LOPRESSOR) 25 MG tablet Take 1 tablet (25 mg total) by mouth 2 (two) times daily. 02/20/22   Annita Brod, MD  montelukast (SINGULAIR) 10 MG tablet Take 10 mg by mouth at bedtime.     [provider]  Multiple Vitamins-Minerals (PRESERVISION AREDS PO) Take 1 capsule by mouth 2 (two) times daily.    [provider]  nystatin (MYCOSTATIN/NYSTOP) powder Apply topically 3 (  three) times daily. 01/30/22   [name], MD  omeprazole (PRILOSEC) 20 MG capsule Take 20 mg by mouth 2 (two) times daily before a meal.    [provider]  ondansetron (ZOFRAN) 4 MG tablet Take 1 tablet (4 mg total) by mouth daily as needed for nausea or vomiting. 01/15/22 01/15/23  Enzo Bi, MD  Polyethyl Glycol-Propyl Glycol (SYSTANE) 0.4-0.3 % SOLN Apply 1 drop to eye 2 (two) times daily.    [provider]  SYMBICORT 160-4.5 MCG/ACT inhaler Inhale 2 puffs into the lungs 2 (two) times daily. 06/19/21   [provider]  traMADol (ULTRAM) 50 MG tablet Take 1 tablet (50 mg total) by mouth every 6 (six) hours as needed for severe pain or moderate pain. 01/30/22   [name], MD   Physical Exam: Vitals:   03/13/22 1029 03/13/22 1400 03/13/22 1414 03/13/22 1500  BP: (!) 106/54 (!) 100/52 (!) 100/52 (!) 106/44  Pulse: 91 (!) 101 98 97  Resp: 20 20 18 18   Temp: 98.4 F (36.9 C)  98.2 F (36.8 C)   TempSrc: Oral  Oral   SpO2: 95% 96% 96% 98%  Weight:      Height:       Constitutional: appears age-appropriate, frail, NAD, calm, comfortable Eyes: PERRL, lids and conjunctivae normal ENMT: Mucous membranes are moist. Posterior pharynx clear of any exudate or lesions. Age-appropriate  dentition. Hearing appropriate Neck: normal, supple, no masses, no thyromegaly Respiratory: clear to auscultation bilaterally, no wheezing, no crackles. Normal respiratory effort. No accessory muscle use.  Cardiovascular: Regular rate and rhythm, no murmurs / rubs / gallops. No extremity edema. 2+ pedal pulses. No carotid bruits.  Abdomen: + tenderness, colostomy bag in place with stool.  No masses palpated, no hepatosplenomegaly. Bowel sounds positive.   Musculoskeletal: no clubbing / cyanosis. No joint deformity upper and lower extremities. Good ROM, no contractures, no atrophy. Normal muscle tone.  Skin: no rashes, lesions, ulcers. No induration Neurologic: Sensation intact. Strength 5/5 in all 4.  Psychiatric: Normal judgment and insight. Alert and oriented x 3. Normal mood.   EKG: Not indicated on admission  Chest x-ray on Admission: I personally reviewed and I agree with radiologist reading as below.  DG Chest 2 View  Result Date: 03/13/2022 CLINICAL DATA:  Suspected sepsis EXAM: CHEST - 2 VIEW COMPARISON:  Chest x-ray February 17, 2022 FINDINGS: Stable cardiomediastinal contours. Unchanged calcified atherosclerosis of the aortic arch. Both lungs are clear. The visualized skeletal structures are unremarkable. IMPRESSION: No acute cardiopulmonary abnormality. Electronically Signed   By: Beryle Flock M.D.   On: 03/13/2022 10:56    Labs on Admission: I have personally reviewed following labs  CBC: Recent Labs  Lab 03/13/22 1032  WBC 8.5  NEUTROABS 6.4  HGB 9.2*  HCT 28.7*  MCV 87.8  PLT 400   Basic Metabolic Panel: Recent Labs  Lab 03/13/22 1032 03/13/22 1540  NA 130*  --   K 3.9  --   CL 103  --   CO2 18*  --   GLUCOSE 89  --   BUN 19  --   CREATININE 1.00  --   CALCIUM 8.4*  --   MG  --  1.0*  PHOS  --  3.4   GFR: Estimated Creatinine Clearance: 35 mL/min (by C-G formula based on SCr of 1 mg/dL).  Liver Function Tests: Recent Labs  Lab 03/13/22 1032   AST 13*  ALT 8  ALKPHOS 88  BILITOT 0.4  PROT  6.7  ALBUMIN 2.9*   Coagulation Profile: Recent Labs  Lab 03/13/22 1346  INR 1.7*   Urine analysis:    Component Value Date/Time   COLORURINE AMBER (A) 01/24/2022 1334   APPEARANCEUR HAZY (A) 01/24/2022 1334   APPEARANCEUR Clear 07/22/2011 0105   LABSPEC 1.027 01/24/2022 1334   LABSPEC 1.016 07/22/2011 0105   PHURINE 5.0 01/24/2022 1334   GLUCOSEU NEGATIVE 01/24/2022 1334   GLUCOSEU Negative 07/22/2011 0105   HGBUR SMALL (A) 01/24/2022 1334   BILIRUBINUR NEGATIVE 01/24/2022 1334   BILIRUBINUR Negative 07/22/2011 0105   KETONESUR NEGATIVE 01/24/2022 1334   PROTEINUR 30 (A) 01/24/2022 1334   NITRITE NEGATIVE 01/24/2022 1334   LEUKOCYTESUR NEGATIVE 01/24/2022 1334   LEUKOCYTESUR 3+ 07/22/2011 0105   Dr. Tobie Poet [facility]  If 7PM-7AM, please contact overnight-coverage provider If 7AM-7PM, please contact day coverage provider [email or web address]  03/13/2022, 4:59 PM

## 2022-03-13 NOTE — Assessment & Plan Note (Addendum)
-   Secondary to p.o. intake - Sodium chloride 1.5 L bolus in the ED - BMP in the a.m.

## 2022-03-13 NOTE — ED Notes (Signed)
Pt to ED with family, from home, pt had abdominal surgery in August with recurrent complications/infections since. Pt had fever last night. PA examined surgical incisions which are currently covered with bandages. Pt also has ileostomy.

## 2022-03-14 DIAGNOSIS — K219 Gastro-esophageal reflux disease without esophagitis: Secondary | ICD-10-CM | POA: Diagnosis present

## 2022-03-14 DIAGNOSIS — Z7901 Long term (current) use of anticoagulants: Secondary | ICD-10-CM | POA: Diagnosis not present

## 2022-03-14 DIAGNOSIS — I824Y2 Acute embolism and thrombosis of unspecified deep veins of left proximal lower extremity: Secondary | ICD-10-CM | POA: Diagnosis not present

## 2022-03-14 DIAGNOSIS — T8189XA Other complications of procedures, not elsewhere classified, initial encounter: Secondary | ICD-10-CM | POA: Diagnosis present

## 2022-03-14 DIAGNOSIS — F419 Anxiety disorder, unspecified: Secondary | ICD-10-CM | POA: Diagnosis present

## 2022-03-14 DIAGNOSIS — B999 Unspecified infectious disease: Secondary | ICD-10-CM | POA: Diagnosis not present

## 2022-03-14 DIAGNOSIS — E871 Hypo-osmolality and hyponatremia: Secondary | ICD-10-CM | POA: Diagnosis present

## 2022-03-14 DIAGNOSIS — Y838 Other surgical procedures as the cause of abnormal reaction of the patient, or of later complication, without mention of misadventure at the time of the procedure: Secondary | ICD-10-CM | POA: Diagnosis present

## 2022-03-14 DIAGNOSIS — J439 Emphysema, unspecified: Secondary | ICD-10-CM | POA: Diagnosis present

## 2022-03-14 DIAGNOSIS — K659 Peritonitis, unspecified: Secondary | ICD-10-CM | POA: Diagnosis present

## 2022-03-14 DIAGNOSIS — Z7951 Long term (current) use of inhaled steroids: Secondary | ICD-10-CM | POA: Diagnosis not present

## 2022-03-14 DIAGNOSIS — Z6834 Body mass index (BMI) 34.0-34.9, adult: Secondary | ICD-10-CM | POA: Diagnosis not present

## 2022-03-14 DIAGNOSIS — L89150 Pressure ulcer of sacral region, unstageable: Secondary | ICD-10-CM | POA: Diagnosis present

## 2022-03-14 DIAGNOSIS — R188 Other ascites: Secondary | ICD-10-CM | POA: Diagnosis present

## 2022-03-14 DIAGNOSIS — E039 Hypothyroidism, unspecified: Secondary | ICD-10-CM | POA: Diagnosis present

## 2022-03-14 DIAGNOSIS — K651 Peritoneal abscess: Secondary | ICD-10-CM | POA: Diagnosis present

## 2022-03-14 DIAGNOSIS — I82533 Chronic embolism and thrombosis of popliteal vein, bilateral: Secondary | ICD-10-CM | POA: Diagnosis present

## 2022-03-14 DIAGNOSIS — I82723 Chronic embolism and thrombosis of deep veins of upper extremity, bilateral: Secondary | ICD-10-CM | POA: Diagnosis not present

## 2022-03-14 DIAGNOSIS — N739 Female pelvic inflammatory disease, unspecified: Secondary | ICD-10-CM | POA: Diagnosis not present

## 2022-03-14 DIAGNOSIS — I825Y3 Chronic embolism and thrombosis of unspecified deep veins of proximal lower extremity, bilateral: Secondary | ICD-10-CM | POA: Diagnosis not present

## 2022-03-14 DIAGNOSIS — Z808 Family history of malignant neoplasm of other organs or systems: Secondary | ICD-10-CM | POA: Diagnosis not present

## 2022-03-14 DIAGNOSIS — Z933 Colostomy status: Secondary | ICD-10-CM | POA: Diagnosis not present

## 2022-03-14 DIAGNOSIS — E872 Acidosis, unspecified: Secondary | ICD-10-CM | POA: Diagnosis present

## 2022-03-14 DIAGNOSIS — E7800 Pure hypercholesterolemia, unspecified: Secondary | ICD-10-CM | POA: Diagnosis present

## 2022-03-14 DIAGNOSIS — D649 Anemia, unspecified: Secondary | ICD-10-CM | POA: Diagnosis present

## 2022-03-14 DIAGNOSIS — T8143XA Infection following a procedure, organ and space surgical site, initial encounter: Secondary | ICD-10-CM | POA: Diagnosis present

## 2022-03-14 DIAGNOSIS — E669 Obesity, unspecified: Secondary | ICD-10-CM | POA: Diagnosis present

## 2022-03-14 DIAGNOSIS — Z801 Family history of malignant neoplasm of trachea, bronchus and lung: Secondary | ICD-10-CM | POA: Diagnosis not present

## 2022-03-14 DIAGNOSIS — Z803 Family history of malignant neoplasm of breast: Secondary | ICD-10-CM | POA: Diagnosis not present

## 2022-03-14 DIAGNOSIS — I82513 Chronic embolism and thrombosis of femoral vein, bilateral: Secondary | ICD-10-CM | POA: Diagnosis present

## 2022-03-14 LAB — CBC
HCT: 26.7 % — ABNORMAL LOW (ref 36.0–46.0)
Hemoglobin: 8.5 g/dL — ABNORMAL LOW (ref 12.0–15.0)
MCH: 27.9 pg (ref 26.0–34.0)
MCHC: 31.8 g/dL (ref 30.0–36.0)
MCV: 87.5 fL (ref 80.0–100.0)
Platelets: 285 10*3/uL (ref 150–400)
RBC: 3.05 MIL/uL — ABNORMAL LOW (ref 3.87–5.11)
RDW: 16.8 % — ABNORMAL HIGH (ref 11.5–15.5)
WBC: 8.7 10*3/uL (ref 4.0–10.5)
nRBC: 0 % (ref 0.0–0.2)

## 2022-03-14 LAB — BASIC METABOLIC PANEL
Anion gap: 6 (ref 5–15)
BUN: 14 mg/dL (ref 8–23)
CO2: 17 mmol/L — ABNORMAL LOW (ref 22–32)
Calcium: 8 mg/dL — ABNORMAL LOW (ref 8.9–10.3)
Chloride: 108 mmol/L (ref 98–111)
Creatinine, Ser: 0.67 mg/dL (ref 0.44–1.00)
GFR, Estimated: >60 mL/min (ref >60)
Glucose, Bld: 111 mg/dL — ABNORMAL HIGH (ref 70–99)
Potassium: 4.3 mmol/L (ref 3.5–5.1)
Sodium: 131 mmol/L — ABNORMAL LOW (ref 135–145)

## 2022-03-14 LAB — HEPARIN LEVEL (UNFRACTIONATED): Heparin Unfractionated: 0.66 IU/mL (ref 0.30–0.70)

## 2022-03-14 LAB — APTT
aPTT: 54 seconds — ABNORMAL HIGH (ref 24–36)
aPTT: 85 seconds — ABNORMAL HIGH (ref 24–36)
aPTT: 93 seconds — ABNORMAL HIGH (ref 24–36)

## 2022-03-14 LAB — MAGNESIUM: Magnesium: 1.6 mg/dL — ABNORMAL LOW (ref 1.7–2.4)

## 2022-03-14 LAB — VITAMIN B12: Vitamin B-12: 3597 pg/mL — ABNORMAL HIGH (ref 180–914)

## 2022-03-14 MED ORDER — HEPARIN BOLUS VIA INFUSION
1600.0000 [IU] | Freq: Once | INTRAVENOUS | Status: AC
  Administered 2022-03-14: 1600 [IU] via INTRAVENOUS
  Filled 2022-03-14: qty 1600

## 2022-03-14 MED ORDER — MAGNESIUM SULFATE 2 GM/50ML IV SOLN
2.0000 g | Freq: Once | INTRAVENOUS | Status: AC
  Administered 2022-03-14: 2 g via INTRAVENOUS
  Filled 2022-03-14: qty 50

## 2022-03-14 MED ORDER — LORAZEPAM 0.5 MG PO TABS
0.5000 mg | ORAL_TABLET | Freq: Four times a day (QID) | ORAL | Status: DC | PRN
  Administered 2022-03-17 (×2): 0.5 mg via ORAL
  Filled 2022-03-14 (×2): qty 1

## 2022-03-14 NOTE — Progress Notes (Signed)
Patient c/o left arm hurting, "my whole arm hurts".  Cipro infusing at a reduced rate of 100 ml/hr, further decreased down to 50 ml/hr.  Pt states left arm feels better 30 minutes after infusion rate adjustment.  Dr. Jimmye Norman made aware.

## 2022-03-14 NOTE — Progress Notes (Signed)
Patient ID: Lindsey Thomas, female   DOB: 1940-09-10, 81 y.o.   MRN: [id number]     [facility] Day(s): 0.   Interval History: Patient seen and examined, no acute events or new complaints overnight. Patient reports feeling well.  She denies any complaint this morning.  She denies any abdominal pain.  She denies any nausea or vomiting.  She continued having intermittent leakage from ileostomy bag.  This morning not leaking.  Vital signs in last 24 hours: [min-max] current  Temp:  [98.2 F (36.8 C)-98.7 F (37.1 C)] 98.3 F (36.8 C) (10/08 0752) Pulse Rate:  [65-101] 91 (10/08 0752) Resp:  [18-20] 20 (10/08 0752) BP: (97-106)/(42-70) 101/42 (10/08 0752) SpO2:  [92 %-100 %] 100 % (10/08 0752) Weight:  [73.3 kg] 73.3 kg (10/08 0600)     Height: 4\' 9"  (144.8 cm) Weight: 73.3 kg BMI (Calculated): 34.96   Physical Exam:  Constitutional: alert, cooperative and no distress  Respiratory: breathing non-labored at rest  Cardiovascular: regular rate and sinus rhythm  Gastrointestinal: soft, non-tender, and non-distended.  Ileostomy pink and patent.  Purulent draining from left abdominal and lower midline wound.  Labs:     Latest Ref Rng & Units 03/14/2022    5:14 AM 03/13/2022   10:32 AM 02/20/2022    5:45 AM  CBC  WBC 4.0 - 10.5 K/uL 8.7  8.5  10.8   Hemoglobin 12.0 - 15.0 g/dL 8.5  9.2  8.6   Hematocrit 36.0 - 46.0 % 26.7  28.7  25.6   Platelets 150 - 400 K/uL 285  311  220       Latest Ref Rng & Units 03/14/2022    5:14 AM 03/13/2022   10:32 AM 02/20/2022    5:45 AM  CMP  Glucose 70 - 99 mg/dL 111  89  119   BUN 8 - 23 mg/dL 14  19  10    Creatinine 0.44 - 1.00 mg/dL 0.67  1.00  0.35   Sodium 135 - 145 mmol/L 131  130  135   Potassium 3.5 - 5.1 mmol/L 4.3  3.9  3.1   Chloride 98 - 111 mmol/L 108  103  110   CO2 22 - 32 mmol/L 17  18  19    Calcium 8.9 - 10.3 mg/dL 8.0  8.4  8.0   Total Protein 6.5 - 8.1 g/dL  6.7    Total Bilirubin 0.3 - 1.2 mg/dL  0.4     Alkaline Phos 38 - 126 U/L  88    AST 15 - 41 U/L  13    ALT 0 - 44 U/L  8      Imaging studies: No new pertinent imaging studies   Assessment/Plan:  81 y.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 -He has been causing intermittent fever.  He has been also increased drainage through the left abdominal and lower midline wound.  Somehow these wounds are communicating even the recent 1 of these track on CT scan but when the pelvic abscess was drained before the drainage from these 2 areas resolved. -I agree with recommendation of percutaneous drainage of pelvic abscess. -We will consult vascular surgery tomorrow for evaluation of bilateral lower extremity DVTs, for recommendation of management -Regular diet.  N.p.o. after midnight -Continue IV antibiotic therapy -I will follow along.  Arnold Long, MD

## 2022-03-14 NOTE — Progress Notes (Signed)
ANTICOAGULATION CONSULT NOTE  Pharmacy Consult for IV heparin Indication: DVT  Allergies  Allergen Reactions   Eryc [Erythromycin]    Levofloxacin Nausea Only   Percocet [Oxycodone-Acetaminophen] Nausea And Vomiting   Augmentin [Amoxicillin-Pot Clavulanate] Rash   Celecoxib Palpitations    Patient Measurements: Height: 4\' 9"  (144.8 cm) Weight: 73.3 kg (161 lb 9.6 oz) IBW/kg (Calculated) : 38.6 Heparin Dosing Weight: 55.4 kg  Vital Signs: Temp: 99.1 F (37.3 C) (10/08 1523) Temp Source: Oral (10/08 1523) BP: 86/49 (10/08 1523) Pulse Rate: 94 (10/08 1523)  Labs: Recent Labs    03/13/22 1032 03/13/22 1346 03/13/22 1719 03/14/22 0514 03/14/22 1504  HGB 9.2*  --   --  8.5*  --   HCT 28.7*  --   --  26.7*  --   PLT 311  --   --  285  --   APTT  --   --  43* 54* 85*  LABPROT  --  19.7*  --   --   --   INR  --  1.7*  --   --   --   HEPARINUNFRC  --   --   --  0.66  --   CREATININE 1.00  --   --  0.67  --      Estimated Creatinine Clearance: 44.2 mL/min (by C-G formula based on SCr of 0.67 mg/dL).   Medical History: Past Medical History:  Diagnosis Date   Anxiety    Arthritis    Asthma    DVT (deep venous thrombosis) (HCC)    Edema    feet   Emphysema of lung (HCC)    GERD (gastroesophageal reflux disease)    Headache    MIGRAINES   History of orthopnea    Hypercholesterolemia    Hypothyroidism    NODULES   Lymphedema    Migraine    Non-small cell lung cancer, right ([place]) 05/2019   Rad tx's   Shortness of breath dyspnea    WITH EXERTION   Sleep apnea    MILD, DOES NOT USE CPAP   Thyroid nodule     Medications:  Last dose of Eliquis 03/12/22 @2000 .  Assessment: 81 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 Pt has IR procedure planned for 10/9 and pharmacy has been consulted for heparin in the perioperative period. Pt on apixaban currently  for bilateral DVT from 03/04/2022.  BL aPTT 43. H&H stable. Hgb consistent with baseline. BL INR elevated at 1.7.  Goal of Therapy:  aPTT 66-102 seconds Monitor platelets by anticoagulation protocol: Yes   10/8 0514 aPTT subthera x 1, HL 0.66 10/8 1504 aPTT therapeutic x 1  Plan: aPTT therapeutic x 1 Continue heparin infusion at 1050 units/hr Confirmatory aPTT in 8 hours  HL & CBC daily   Lindsey Thomas, PharmD Clinical Pharmacist  03/14/2022 3:31 PM

## 2022-03-14 NOTE — Progress Notes (Signed)
ANTICOAGULATION CONSULT NOTE  Pharmacy Consult for IV heparin Indication: DVT  Allergies  Allergen Reactions   Eryc [Erythromycin]    Levofloxacin Nausea Only   Percocet [Oxycodone-Acetaminophen] Nausea And Vomiting   Augmentin [Amoxicillin-Pot Clavulanate] Rash   Celecoxib Palpitations    Patient Measurements: Height: 4\' 9"  (144.8 cm) Weight: 73.3 kg (161 lb 9.6 oz) IBW/kg (Calculated) : 38.6 Heparin Dosing Weight: 55.4 kg  Vital Signs: Temp: 99 F (37.2 C) (10/08 1912) Temp Source: Oral (10/08 1912) BP: 96/53 (10/08 1912) Pulse Rate: 99 (10/08 1912)  Labs: Recent Labs    03/13/22 1032 03/13/22 1346 03/13/22 1719 03/14/22 0514 03/14/22 1504 03/14/22 2253  HGB 9.2*  --   --  8.5*  --   --   HCT 28.7*  --   --  26.7*  --   --   PLT 311  --   --  285  --   --   APTT  --   --    < > 54* 85* 93*  LABPROT  --  19.7*  --   --   --   --   INR  --  1.7*  --   --   --   --   HEPARINUNFRC  --   --   --  0.66  --   --   CREATININE 1.00  --   --  0.67  --   --    < > = values in this interval not displayed.     Estimated Creatinine Clearance: 44.2 mL/min (by C-G formula based on SCr of 0.67 mg/dL).   Medical History: Past Medical History:  Diagnosis Date   Anxiety    Arthritis    Asthma    DVT (deep venous thrombosis) (HCC)    Edema    feet   Emphysema of lung (HCC)    GERD (gastroesophageal reflux disease)    Headache    MIGRAINES   History of orthopnea    Hypercholesterolemia    Hypothyroidism    NODULES   Lymphedema    Migraine    Non-small cell lung cancer, right ([place]) 05/2019   Rad tx's   Shortness of breath dyspnea    WITH EXERTION   Sleep apnea    MILD, DOES NOT USE CPAP   Thyroid nodule     Medications:  Last dose of Eliquis 03/12/22 @2000 .  Assessment: 81 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 Pt has IR procedure planned for  10/9 and pharmacy has been consulted for heparin in the perioperative period. Pt on apixaban currently for bilateral DVT from 03/04/2022.  BL aPTT 43. H&H stable. Hgb consistent with baseline. BL INR elevated at 1.7.  Goal of Therapy:  aPTT 66-102 seconds Monitor platelets by anticoagulation protocol: Yes   10/8 0514 aPTT subthera x 1, HL 0.66 10/8 1504 aPTT therapeutic x 1 10/8 2253 aPTT 93, therapeutic x 2  Plan: aPTT therapeutic x 1 Continue heparin infusion at 1050 units/hr Heparin infusion schedule to d/c @ 0400 for procedure Will follow up regarding anti-coag plan post op   Renda Rolls, PharmD, [facility] 03/14/2022 11:56 PM

## 2022-03-14 NOTE — Progress Notes (Signed)
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 Continue on IV flagyl, cipro. Blood cx NGTD. IR consulted for JP drain placement tomorrow. NPO after midnight. Gen surg following and recs apprec    Pressure injury of skin: present on admission. Wound care consulted    Hyponatremia: trending up slightly today. Will continue to monitor    Chronic b/l LE DVTs: holding home dose of eliquis. Continue on IV heparin drip    Hypomagnesemia: mg sulfate given   Anxiety: severity unknown. Lorazepam prn   Normocytic anemia: H&H are labile. Will transfuse if Hb < 7.0    DVT prophylaxis: IV heparin   Code Status: full  Family Communication: Disposition Plan:  depends on PT/OT recs (not consulted yet).  Level of care: Telemetry Medical  Status is: Observation The patient remains OBS appropriate and will d/c before 2 midnights.    Consultants:  General surg IR  Procedures:   Antimicrobials: cipro, flagyl   Subjective: Pt c/o fatigue  Objective: Vitals:   03/13/22 2027 03/14/22 0520 03/14/22 0600 03/14/22 0752  BP: (!) 105/58 (!) 106/50  (!) 101/42  Pulse: 65 88  91  Resp: 20 20  20   Temp: 98.3 F (36.8 C) 98.7 F (37.1 C)  98.3 F (36.8 C)  TempSrc: Oral Oral    SpO2: 92% 98%  100%  Weight:   73.3 kg    Height:        Intake/Output Summary (Last 24 hours) at 03/14/2022 0809 Last data filed at 03/14/2022 0615 Gross per 24 hour  Intake 3542 ml  Output 700 ml  Net 2842 ml   Filed Weights   03/13/22 1027 03/14/22 0600  Weight: 72 kg 73.3 kg    Examination:  General exam: Appears calm and comfortable  Respiratory system: Clear to auscultation. Respiratory effort normal. Cardiovascular system: S1 & S2 +. No rubs, gallops or clicks. Gastrointestinal system: Abdomen is obese, soft and nontender.  Normal bowel sounds heard. Central nervous system: Alert and oriented. Moves all extremities  Psychiatry: Judgement and insight appear normal. Mood & affect appropriate.     Data Reviewed: I have personally reviewed following labs and imaging studies  CBC: Recent Labs  Lab 03/13/22 1032 03/14/22 0514  WBC 8.5 8.7  NEUTROABS 6.4  --   HGB 9.2* 8.5*  HCT 28.7* 26.7*  MCV 87.8 87.5  PLT 311 284   Basic Metabolic Panel: Recent Labs  Lab 03/13/22 1032 03/13/22 1540 03/14/22 0514  NA 130*  --  131*  K 3.9  --  4.3  CL 103  --  108  CO2 18*  --  17*  GLUCOSE 89  --  111*  BUN 19  --  14  CREATININE 1.00  --  0.67  CALCIUM 8.4*  --  8.0*  MG  --  1.0* 1.6*  PHOS  --  3.4  --    GFR: Estimated Creatinine Clearance: 44.2 mL/min (by C-G formula based on SCr of 0.67 mg/dL). Liver Function Tests: Recent Labs  Lab 03/13/22 1032  AST 13*  ALT 8  ALKPHOS 88  BILITOT 0.4  PROT 6.7  ALBUMIN 2.9*   No results for input(s): "LIPASE", "AMYLASE" in the last 168 hours. No results for input(s): "AMMONIA" in the last 168 hours. Coagulation Profile: Recent Labs  Lab 03/13/22 1346  INR 1.7*   Cardiac Enzymes: No results for input(s): "CKTOTAL", "CKMB", "CKMBINDEX", "TROPONINI" in the last 168 hours. BNP (last 3 results) No results for input(s): "PROBNP" in the last 8760 hours. HbA1C: No results for input(s): "HGBA1C" in the last 72 hours. CBG: No results for input(s):  "GLUCAP" in the last 168 hours. Lipid Profile: No results for input(s): "CHOL", "HDL", "LDLCALC", "TRIG", "CHOLHDL", "LDLDIRECT" in the last 72 hours. Thyroid Function Tests: No results for input(s): "TSH", "T4TOTAL", "FREET4", "T3FREE", "THYROIDAB" in the last 72 hours. Anemia Panel: Recent Labs    03/13/22 1540  VITAMINB12 3,597*   Sepsis Labs: Recent Labs  Lab 03/13/22 1032 03/13/22 1540  LATICACIDVEN 1.4 1.0    Recent Results (from the past 240 hour(s))  Culture, blood (Routine x 2)     Status: None (Preliminary result)   Collection Time: 03/13/22  1:46 PM   Specimen: BLOOD  Result Value Ref Range Status   Specimen Description BLOOD LEFT ARM  Final   Special Requests   Final    BOTTLES DRAWN AEROBIC AND ANAEROBIC Blood Culture adequate volume   Culture   Final    NO GROWTH < 24 HOURS Performed at [facility], [address] [place], [place] [address]    Report Status PENDING  Incomplete  Culture, blood (Routine x 2)     Status: None (Preliminary result)   Collection Time: 03/13/22  1:46 PM   Specimen: BLOOD  Result Value Ref Range Status   Specimen Description BLOOD RIGHT ARM  Final   Special Requests   Final    BOTTLES DRAWN AEROBIC AND ANAEROBIC Blood Culture adequate volume   Culture   Final    NO GROWTH < 24 HOURS Performed at [facility], [address], [place], [place] [address]    Report Status PENDING  Incomplete         Radiology Studies: DG Chest 2 View  Result Date: 03/13/2022 CLINICAL DATA:  Suspected sepsis EXAM: CHEST - 2 VIEW COMPARISON:  Chest x-ray February 17, 2022 FINDINGS: Stable cardiomediastinal contours. Unchanged calcified atherosclerosis of the aortic arch. Both lungs are clear. The visualized skeletal structures are unremarkable. IMPRESSION: No acute cardiopulmonary abnormality. Electronically Signed   By: Beryle Flock M.D.   On: 03/13/2022 10:56        Scheduled Meds:  ascorbic acid  500 mg Oral  BID   cholecalciferol  5,000 Units Oral Daily   vitamin B-12  1,000 mcg Oral Daily   feeding supplement  237 mL Oral TID BM   mometasone-formoterol  2 puff Inhalation BID   montelukast  10 mg Oral QHS   pantoprazole  40 mg Oral BID AC   zinc sulfate  220 mg Oral Daily   Continuous Infusions:  sodium chloride 125 mL/hr at 03/14/22 0500   ciprofloxacin Stopped (03/14/22 0224)   heparin 1,050 Units/hr (03/14/22 0625)   metronidazole Stopped (03/14/22 0344)     LOS: 0 days  Time spent: 35 mins    Wyvonnia Dusky, MD [facility] Pager [phone]  If 7PM-7AM, please contact night-coverage [email or web address] 03/14/2022, 8:09 AM

## 2022-03-14 NOTE — Progress Notes (Signed)
ANTICOAGULATION CONSULT NOTE  Pharmacy Consult for IV heparin Indication: DVT  Allergies  Allergen Reactions   Eryc [Erythromycin]    Levofloxacin Nausea Only   Percocet [Oxycodone-Acetaminophen] Nausea And Vomiting   Augmentin [Amoxicillin-Pot Clavulanate] Rash   Celecoxib Palpitations    Patient Measurements: Height: 4\' 9"  (144.8 cm) Weight: 72 kg (158 lb 11.7 oz) IBW/kg (Calculated) : 38.6 Heparin Dosing Weight: 55.4 kg  Vital Signs: Temp: 98.7 F (37.1 C) (10/08 0520) Temp Source: Oral (10/08 0520) BP: 106/50 (10/08 0520) Pulse Rate: 88 (10/08 0520)  Labs: Recent Labs    03/13/22 1032 03/13/22 1346 03/13/22 1719 03/14/22 0514  HGB 9.2*  --   --  8.5*  HCT 28.7*  --   --  26.7*  PLT 311  --   --  285  APTT  --   --  43* 54*  LABPROT  --  19.7*  --   --   INR  --  1.7*  --   --   HEPARINUNFRC  --   --   --  0.66  CREATININE 1.00  --   --   --      Estimated Creatinine Clearance: 35 mL/min (by C-G formula based on SCr of 1 mg/dL).   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  Assessment: 81 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 Pt has IR procedure planned for 10/9 and pharmacy has been consulted for heparin in the perioperative period. Pt on apixaban currently for bilateral DVT from 03/04/2022.  BL aPTT 43. H&H  stable. Hgb consistent with baseline. BL INR elevated at 1.7.  Goal of Therapy:  aPTT 66-102 seconds Monitor platelets by anticoagulation protocol: Yes   10/8 0514 aPTT subthera x 1, HL 0.66 Plan:  Bolus 1600 units x 1 Increase heparin infusion to 1050 units/hr Recheck aPTT in 8 hours after rate change HL & CBC daily  Renda Rolls, PharmD, [facility] 03/14/2022 5:45 AM

## 2022-03-15 ENCOUNTER — Ambulatory Visit (INDEPENDENT_AMBULATORY_CARE_PROVIDER_SITE_OTHER): Payer: Medicare HMO | Admitting: Vascular Surgery

## 2022-03-15 ENCOUNTER — Ambulatory Visit (HOSPITAL_COMMUNITY): Payer: Medicare HMO | Admitting: Nurse Practitioner

## 2022-03-15 ENCOUNTER — Inpatient Hospital Stay: Payer: Medicare HMO

## 2022-03-15 DIAGNOSIS — I82723 Chronic embolism and thrombosis of deep veins of upper extremity, bilateral: Secondary | ICD-10-CM | POA: Diagnosis not present

## 2022-03-15 DIAGNOSIS — E871 Hypo-osmolality and hyponatremia: Secondary | ICD-10-CM | POA: Diagnosis not present

## 2022-03-15 DIAGNOSIS — B999 Unspecified infectious disease: Secondary | ICD-10-CM | POA: Diagnosis not present

## 2022-03-15 LAB — BASIC METABOLIC PANEL
Anion gap: 4 — ABNORMAL LOW (ref 5–15)
BUN: 8 mg/dL (ref 8–23)
CO2: 17 mmol/L — ABNORMAL LOW (ref 22–32)
Calcium: 8.4 mg/dL — ABNORMAL LOW (ref 8.9–10.3)
Chloride: 109 mmol/L (ref 98–111)
Creatinine, Ser: 0.61 mg/dL (ref 0.44–1.00)
GFR, Estimated: >60 mL/min (ref >60)
Glucose, Bld: 117 mg/dL — ABNORMAL HIGH (ref 70–99)
Potassium: 4 mmol/L (ref 3.5–5.1)
Sodium: 130 mmol/L — ABNORMAL LOW (ref 135–145)

## 2022-03-15 LAB — CBC
HCT: 23.3 % — ABNORMAL LOW (ref 36.0–46.0)
Hemoglobin: 7.6 g/dL — ABNORMAL LOW (ref 12.0–15.0)
MCH: 28 pg (ref 26.0–34.0)
MCHC: 32.6 g/dL (ref 30.0–36.0)
MCV: 86 fL (ref 80.0–100.0)
Platelets: 313 10*3/uL (ref 150–400)
RBC: 2.71 MIL/uL — ABNORMAL LOW (ref 3.87–5.11)
RDW: 17 % — ABNORMAL HIGH (ref 11.5–15.5)
WBC: 10.5 10*3/uL (ref 4.0–10.5)
nRBC: 0 % (ref 0.0–0.2)

## 2022-03-15 MED ORDER — MIDAZOLAM HCL 2 MG/2ML IJ SOLN
INTRAMUSCULAR | Status: AC
  Filled 2022-03-15: qty 4

## 2022-03-15 MED ORDER — MORPHINE SULFATE (PF) 2 MG/ML IV SOLN: 2.0000 mg | INTRAVENOUS | Status: DC | PRN

## 2022-03-15 MED ORDER — FENTANYL CITRATE (PF) 100 MCG/2ML IJ SOLN
INTRAMUSCULAR | Status: AC
  Filled 2022-03-15: qty 2

## 2022-03-15 MED ORDER — HEPARIN (PORCINE) 25000 UT/250ML-% IV SOLN
1150.0000 [IU]/h | INTRAVENOUS | Status: DC
  Administered 2022-03-15: 1050 [IU]/h via INTRAVENOUS
  Administered 2022-03-16: 1150 [IU]/h via INTRAVENOUS
  Filled 2022-03-15: qty 250

## 2022-03-15 MED ORDER — LOPERAMIDE HCL 2 MG PO CAPS
2.0000 mg | ORAL_CAPSULE | Freq: Three times a day (TID) | ORAL | Status: DC
  Administered 2022-03-16 – 2022-03-17 (×6): 2 mg via ORAL
  Filled 2022-03-15 (×5): qty 1

## 2022-03-15 MED ORDER — MEDIHONEY WOUND/BURN DRESSING EX PSTE
1.0000 | PASTE | Freq: Every day | CUTANEOUS | Status: DC
  Administered 2022-03-16 – 2022-03-17 (×2): 1 via TOPICAL
  Filled 2022-03-15: qty 44

## 2022-03-15 MED ORDER — FENTANYL CITRATE (PF) 100 MCG/2ML IJ SOLN
INTRAMUSCULAR | Status: AC | PRN
  Administered 2022-03-15 (×2): 25 ug via INTRAVENOUS

## 2022-03-15 MED ORDER — MIDAZOLAM HCL 2 MG/2ML IJ SOLN
INTRAMUSCULAR | Status: AC | PRN
  Administered 2022-03-15 (×2): .5 mg via INTRAVENOUS

## 2022-03-15 MED ORDER — SODIUM CHLORIDE 0.9% FLUSH
5.0000 mL | Freq: Three times a day (TID) | INTRAVENOUS | Status: DC
  Administered 2022-03-15 – 2022-03-17 (×6): 5 mL

## 2022-03-15 NOTE — Progress Notes (Signed)
Patient ID: Lindsey Thomas, female   DOB: 30-Dec-1940, 81 y.o.   MRN: [id number]     [place] Hospital Day(s): 1.   Interval History: Patient seen and examined, no acute events or new complaints overnight. Patient reports having pain after the procedure.  Vital signs in last 24 hours: [min-max] current  Temp:  [98.2 F (36.8 C)-98.5 F (36.9 C)] 98.4 F (36.9 C) (10/09 2022) Pulse Rate:  [82-106] 102 (10/09 2022) Resp:  [16-18] 18 (10/09 2022) BP: (97-135)/(47-107) 118/50 (10/09 2022) SpO2:  [94 %-100 %] 96 % (10/09 2022)     Height: 4\' 9"  (144.8 cm) Weight: 73.3 kg BMI (Calculated): 34.96   Physical Exam:  Constitutional: alert, cooperative and no distress  Respiratory: breathing non-labored at rest  Cardiovascular: regular rate and sinus rhythm  Gastrointestinal: soft, non-tender, and non-distended.  Colostomy pink and patent.  Liquid stool.  Labs:     Latest Ref Rng & Units 03/15/2022    5:21 AM 03/14/2022    5:14 AM 03/13/2022   10:32 AM  CBC  WBC 4.0 - 10.5 K/uL 10.5  8.7  8.5   Hemoglobin 12.0 - 15.0 g/dL 7.6  8.5  9.2   Hematocrit 36.0 - 46.0 % 23.3  26.7  28.7   Platelets 150 - 400 K/uL 313  285  311       Latest Ref Rng & Units 03/15/2022    5:21 AM 03/14/2022    5:14 AM 03/13/2022   10:32 AM  CMP  Glucose 70 - 99 mg/dL 117  111  89   BUN 8 - 23 mg/dL 8  14  19    Creatinine 0.44 - 1.00 mg/dL 0.61  0.67  1.00   Sodium 135 - 145 mmol/L 130  131  130   Potassium 3.5 - 5.1 mmol/L 4.0  4.3  3.9   Chloride 98 - 111 mmol/L 109  108  103   CO2 22 - 32 mmol/L 17  17  18    Calcium 8.9 - 10.3 mg/dL 8.4  8.0  8.4   Total Protein 6.5 - 8.1 g/dL   6.7   Total Bilirubin 0.3 - 1.2 mg/dL   0.4   Alkaline Phos 38 - 126 U/L   88   AST 15 - 41 U/L   13   ALT 0 - 44 U/L   8     Imaging studies: No new pertinent imaging studies   Assessment/Plan:  81 y.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 -Status post percutaneous drainage today -Continue IV antibiotic  therapy -I added Imodium to make her stool pasty to see if this helps to decrease the leak from the bag -Continue regular diet -I consulted vascular surgery for evaluation of bilateral femoral-popliteal DVTs.  Arnold Long, MD

## 2022-03-15 NOTE — Progress Notes (Signed)
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 Continue w/ ostomy care. Continue on IV cipro, flagyl. Blood cxs NGTD. Will get JP drain placed today as per ID. Gen surg following and recs apprec.    Pressure injury of skin: present on admission. Continue w/ wound care    Hyponatremia: labile. Will continue to monitor    Chronic b/l LE DVTs: holding home dose of eliquis. IV heparin was d/c for procedure today    Hypomagnesemia: will check Mg level in AM   Anxiety: severity unknown. Lorazepam prn   Normocytic anemia: H&H are labile. Will transfuse if Hb < 7.0    DVT prophylaxis: IV heparin   Code Status: full  Family Communication: Disposition Plan:  depends on PT/OT recs (not consulted yet).  Level of care: Telemetry Medical  Status is: Inpatient Remains inpatient appropriate because: severity of illness      Consultants:  General surg IR  Procedures:   Antimicrobials: cipro, flagyl   Subjective: Pt c/o leakage w/ ostomy bags at home.  Objective: Vitals:   03/14/22 1523 03/14/22 1912 03/15/22 0417 03/15/22 0713  BP: (!) 86/49 (!) 96/53 (!) 117/51 (!) 112/50  Pulse: 94 99 98 99  Resp: 16 20 18 16   Temp: 99.1 F (37.3 C) 99 F (37.2 C) 98.2 F (36.8 C) 98.4 F  (36.9 C)  TempSrc: Oral Oral  Oral  SpO2: (!) 86% 99% 100% 97%  Weight:      Height:        Intake/Output Summary (Last 24 hours) at 03/15/2022 0755 Last data filed at 03/15/2022 0543 Gross per 24 hour  Intake 1572.55 ml  Output 1700 ml  Net -127.45 ml   Filed Weights   03/13/22 1027 03/14/22 0600  Weight: 72 kg 73.3 kg    Examination:  General exam: Appears comfortable  Respiratory system: clear breath sounds b/l  Cardiovascular system: S1/S2+. No rubs or clicks  Gastrointestinal system: Abd is soft, NT, obese & normal bowel sounds. Ostomy bag present w/ brown stool present  Central nervous system: alert and oriented. Moves all extremities  Psychiatry: judgement and insight appears normal. Flat mood and affect    Data Reviewed: I have personally reviewed following labs and imaging studies  CBC: Recent Labs  Lab 03/13/22 1032 03/14/22 0514 03/15/22 0521  WBC 8.5 8.7 10.5  NEUTROABS 6.4  --   --   HGB 9.2* 8.5* 7.6*  HCT 28.7* 26.7* 23.3*  MCV 87.8 87.5 86.0  PLT 311 285 967   Basic Metabolic Panel: Recent Labs  Lab 03/13/22 1032 03/13/22 1540 03/14/22 0514 03/15/22 0521  NA 130*  --  131* 130*  K 3.9  --  4.3 4.0  CL 103  --  108 109  CO2 18*  --  17* 17*  GLUCOSE 89  --  111* 117*  BUN 19  --  14 8  CREATININE 1.00  --  0.67 0.61  CALCIUM 8.4*  --  8.0* 8.4*  MG  --  1.0* 1.6*  --   PHOS  --  3.4  --   --    GFR: Estimated Creatinine Clearance: 44.2 mL/min (by C-G formula based on SCr of 0.61 mg/dL). Liver Function Tests: Recent Labs  Lab 03/13/22 1032  AST 13*  ALT 8  ALKPHOS 88  BILITOT 0.4  PROT 6.7  ALBUMIN 2.9*   No results for input(s): "LIPASE", "AMYLASE" in the last 168 hours. No results for input(s): "AMMONIA" in the last 168 hours. Coagulation Profile: Recent Labs  Lab 03/13/22 1346  INR 1.7*   Cardiac Enzymes: No results for input(s): "CKTOTAL", "CKMB", "CKMBINDEX", "TROPONINI" in the last 168 hours. BNP (last 3  results) No results for input(s): "PROBNP" in the last 8760 hours. HbA1C: No results for input(s): "HGBA1C" in the last 72 hours. CBG: No results for input(s): "GLUCAP" in the last 168 hours. Lipid Profile: No results for input(s): "CHOL", "HDL", "LDLCALC", "TRIG", "CHOLHDL", "LDLDIRECT" in the last 72 hours. Thyroid Function Tests: No results for input(s): "TSH", "T4TOTAL", "FREET4", "T3FREE", "THYROIDAB" in the last 72 hours. Anemia Panel: Recent Labs    03/13/22 1540  VITAMINB12 3,597*   Sepsis Labs: Recent Labs  Lab 03/13/22 1032 03/13/22 1540  LATICACIDVEN 1.4 1.0    Recent Results (from the past 240 hour(s))  Culture, blood (Routine x 2)     Status: None (Preliminary result)   Collection Time: 03/13/22  1:46 PM   Specimen: BLOOD  Result Value Ref Range Status   Specimen Description BLOOD LEFT ARM  Final   Special Requests   Final    BOTTLES DRAWN AEROBIC AND ANAEROBIC Blood Culture adequate volume   Culture   Final    NO GROWTH 2 DAYS Performed at [facility], [address], [place], [place] [address]    Report Status PENDING  Incomplete  Culture, blood (Routine x 2)     Status: None (Preliminary result)   Collection Time: 03/13/22  1:46 PM   Specimen: BLOOD  Result Value Ref Range Status   Specimen Description BLOOD RIGHT ARM  Final   Special Requests   Final    BOTTLES DRAWN AEROBIC AND ANAEROBIC Blood Culture adequate volume   Culture   Final    NO GROWTH 2 DAYS Performed at [facility], [address], [place], [place] [address]    Report Status PENDING  Incomplete         Radiology Studies: DG Chest 2 View  Result Date: 03/13/2022 CLINICAL DATA:  Suspected sepsis EXAM: CHEST - 2 VIEW COMPARISON:  Chest x-ray February 17, 2022 FINDINGS: Stable cardiomediastinal contours. Unchanged calcified atherosclerosis of the aortic arch. Both lungs are clear. The visualized skeletal structures are unremarkable. IMPRESSION: No acute  cardiopulmonary abnormality. Electronically Signed   By: Beryle Flock M.D.   On: 03/13/2022 10:56        Scheduled Meds:  ascorbic acid  500 mg Oral BID   cholecalciferol  5,000 Units Oral Daily   vitamin B-12  1,000 mcg Oral Daily   feeding supplement  237 mL Oral TID BM   mometasone-formoterol  2 puff Inhalation BID   montelukast  10 mg Oral QHS   pantoprazole  40 mg Oral BID AC   zinc sulfate  220 mg Oral Daily  Continuous Infusions:  ciprofloxacin Stopped (03/15/22 0031)   metronidazole Stopped (03/15/22 0420)     LOS: 1 day    Time spent: 30 mins    Wyvonnia Dusky, MD [facility] Pager [phone]  If 7PM-7AM, please contact night-coverage [email or web address] 03/15/2022, 7:55 AM

## 2022-03-15 NOTE — Consult Note (Addendum)
[facility] Nurse Consult Note: Surgical team following for assessment and plan of care for abd wounds and they plan to perform a procedure today.  Consult requested for sacrum; Pt's husband and daughter are at the bedside to discuss plan of care.  Unstageable pressure injury, 100% yellow slough, 2X1cm, mod amt tan drainage.  Pressure Injury POA: Yes Dressing procedure/placement/frequency:Topical treatment ordered provided for bedside nurses to perform as follows to assist with removal of nonviable tissue: Apply Medihoney to sacrum wound Q day, then cover with foam dressing.  Change foam dressing Q 3 days or PRN soiling   WOC Nurse ostomy consult note Pt had ileostomy surgery performed 8/16.  Daughter at the bedside states she performs pouch changes and it is a difficult situation.  Pt frequently leaks towards the midline abd related to a crease which occurs when she sits upright, and her peristomal skin is usually red and macerated and painful. They have been using Coloplast convex pouiches, barrier rings, and a belt.  They were seen recently in the outpatient ostomy clinic for troubleshooting assistance and are aware we only have Holllister pouches in the [facility] and state this is fine to substitute.  Stoma type/location: Stoma is red and viable, flush with skin level, 1 inch.  There is some maceration surrounding to 1 cm, family states this is greatly improved from her previous condition. There is a small full thickness wound at 9:00 o'clock near the stoma, .2X.2X.2cm, red and dry.  Applied stoma powder to this site and also surrounding the stoma to protect skin, using a crusting technique.  Output: 70 cc liquid brown stool Ostomy pouching: 1pc.with barrier ring Daughter is familiar with pouch application and emptying and denies further questions. Applied barrier ring to attempt to maintain a seal, then one piece convex pouch and Tegaderm to pouch edges.  Pt could benefit from use of a belt,  but is due to have a procedure on her abd so it was not applied, but was left at the bedside for staff nurse use, along with 3 sets of barrier rings and 3 convex pouches. Use Supplies: barrier ring, Lawson # [id number], convex pouch Lawson # [id number], and belt if needed (left in room).   Please re-consult if further assistance is needed.  Thank-you,  Julien Girt MSN, [place], [place], [place], [place]

## 2022-03-15 NOTE — Procedures (Signed)
Interventional Radiology Procedure Note  Procedure: Image guided drain placement, LLQ.  29F pigtail drain. ~20cc purulent material aspirated for culture  Image guided aspiration of RLQ fluid, too small for drain.  ~5cc of purulent material for culture.  Complications: None  EBL: None Sample:  #1, RLQ: ~5cc aspiration, Culture sent #2, LLQ: ~20cc, culture sent  Recommendations: - Routine drain care, with sterile flushes, record output - follow up Cx - routine wound care  Signed,  Dulcy Fanny. Earleen Newport, DO

## 2022-03-16 DIAGNOSIS — I824Y2 Acute embolism and thrombosis of unspecified deep veins of left proximal lower extremity: Secondary | ICD-10-CM

## 2022-03-16 DIAGNOSIS — I82723 Chronic embolism and thrombosis of deep veins of upper extremity, bilateral: Secondary | ICD-10-CM | POA: Diagnosis not present

## 2022-03-16 DIAGNOSIS — E871 Hypo-osmolality and hyponatremia: Secondary | ICD-10-CM | POA: Diagnosis not present

## 2022-03-16 DIAGNOSIS — B999 Unspecified infectious disease: Secondary | ICD-10-CM | POA: Diagnosis not present

## 2022-03-16 LAB — BASIC METABOLIC PANEL
Anion gap: 6 (ref 5–15)
BUN: <5 mg/dL — ABNORMAL LOW (ref 8–23)
CO2: 19 mmol/L — ABNORMAL LOW (ref 22–32)
Calcium: 8.4 mg/dL — ABNORMAL LOW (ref 8.9–10.3)
Chloride: 105 mmol/L (ref 98–111)
Creatinine, Ser: 0.41 mg/dL — ABNORMAL LOW (ref 0.44–1.00)
GFR, Estimated: >60 mL/min (ref >60)
Glucose, Bld: 108 mg/dL — ABNORMAL HIGH (ref 70–99)
Potassium: 3.5 mmol/L (ref 3.5–5.1)
Sodium: 130 mmol/L — ABNORMAL LOW (ref 135–145)

## 2022-03-16 LAB — CBC
HCT: 23.9 % — ABNORMAL LOW (ref 36.0–46.0)
Hemoglobin: 7.9 g/dL — ABNORMAL LOW (ref 12.0–15.0)
MCH: 28 pg (ref 26.0–34.0)
MCHC: 33.1 g/dL (ref 30.0–36.0)
MCV: 84.8 fL (ref 80.0–100.0)
Platelets: 333 10*3/uL (ref 150–400)
RBC: 2.82 MIL/uL — ABNORMAL LOW (ref 3.87–5.11)
RDW: 17 % — ABNORMAL HIGH (ref 11.5–15.5)
WBC: 11 10*3/uL — ABNORMAL HIGH (ref 4.0–10.5)
nRBC: 0 % (ref 0.0–0.2)

## 2022-03-16 LAB — HEPARIN LEVEL (UNFRACTIONATED)
Heparin Unfractionated: 0.39 IU/mL (ref 0.30–0.70)
Heparin Unfractionated: 0.21 IU/mL — ABNORMAL LOW (ref 0.30–0.70)

## 2022-03-16 LAB — MAGNESIUM: Magnesium: 1.4 mg/dL — ABNORMAL LOW (ref 1.7–2.4)

## 2022-03-16 LAB — APTT
aPTT: 61 seconds — ABNORMAL HIGH (ref 24–36)
aPTT: 90 seconds — ABNORMAL HIGH (ref 24–36)

## 2022-03-16 MED ORDER — SODIUM CHLORIDE 0.9% FLUSH
10.0000 mL | Freq: Two times a day (BID) | INTRAVENOUS | Status: DC
  Administered 2022-03-16 – 2022-03-17 (×2): 10 mL

## 2022-03-16 MED ORDER — HEPARIN BOLUS VIA INFUSION
800.0000 [IU] | Freq: Once | INTRAVENOUS | Status: AC
  Administered 2022-03-16: 800 [IU] via INTRAVENOUS
  Filled 2022-03-16: qty 800

## 2022-03-16 MED ORDER — SODIUM CHLORIDE 0.9% FLUSH: 10.0000 mL | INTRAVENOUS | Status: DC | PRN

## 2022-03-16 MED ORDER — MAGNESIUM SULFATE 4 GM/100ML IV SOLN
4.0000 g | Freq: Once | INTRAVENOUS | Status: AC
  Administered 2022-03-16: 4 g via INTRAVENOUS
  Filled 2022-03-16: qty 100

## 2022-03-16 MED ORDER — HEPARIN (PORCINE) 25000 UT/250ML-% IV SOLN
1350.0000 [IU]/h | INTRAVENOUS | Status: DC
  Administered 2022-03-16 (×2): 1250 [IU]/h via INTRAVENOUS
  Filled 2022-03-16: qty 250

## 2022-03-16 NOTE — Progress Notes (Signed)
ANTICOAGULATION CONSULT NOTE  Pharmacy Consult for IV heparin Indication: DVT  Allergies  Allergen Reactions   Eryc [Erythromycin]    Levofloxacin Nausea Only   Percocet [Oxycodone-Acetaminophen] Nausea And Vomiting   Augmentin [Amoxicillin-Pot Clavulanate] Rash   Celecoxib Palpitations    Patient Measurements: Height: 4\' 9"  (144.8 cm) Weight: 73.3 kg (161 lb 9.6 oz) IBW/kg (Calculated) : 38.6 Heparin Dosing Weight: 55.4 kg  Vital Signs: Temp: 97.9 F (36.6 C) (10/10 0829) Temp Source: Oral (10/10 0829) BP: 104/59 (10/10 0829) Pulse Rate: 99 (10/10 0829)  Labs: Recent Labs    03/13/22 1346 03/13/22 1719 03/14/22 0514 03/14/22 1504 03/14/22 2253 03/15/22 0521 03/15/22 2339 03/16/22 0521 03/16/22 0858  HGB  --    < > 8.5*  --   --  7.6*  --  7.9*  --   HCT  --   --  26.7*  --   --  23.3*  --  23.9*  --   PLT  --   --  285  --   --  313  --  333  --   APTT  --    < > 54*   < > 93*  --  61*  --  90*  LABPROT 19.7*  --   --   --   --   --   --   --   --   INR 1.7*  --   --   --   --   --   --   --   --   HEPARINUNFRC  --   --  0.66  --   --   --   --   --  0.39  CREATININE  --   --  0.67  --   --  0.61  --  0.41*  --    < > = values in this interval not displayed.     Estimated Creatinine Clearance: 44.2 mL/min (A) (by C-G formula based on SCr of 0.41 mg/dL (L)).   Medical History: Past Medical History:  Diagnosis Date   Anxiety    Arthritis    Asthma    DVT (deep venous thrombosis) (HCC)    Edema    feet   Emphysema of lung (HCC)    GERD (gastroesophageal reflux disease)    Headache    MIGRAINES   History of orthopnea    Hypercholesterolemia    Hypothyroidism    NODULES   Lymphedema    Migraine    Non-small cell lung cancer, right ([place]) 05/2019   Rad tx's   Shortness of breath dyspnea    WITH EXERTION   Sleep apnea    MILD, DOES NOT USE CPAP   Thyroid nodule     Medications:  Heparin Dosing Weight: 55.4 kg Last dose of Eliquis 03/12/22  @2000 .  Assessment: 81 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 Pt has IR procedure planned for 10/9 and pharmacy has been consulted for heparin in the perioperative period. Pt on apixaban currently for bilateral DVT from 03/04/2022.  BL aPTT 43. H&H stable. Hgb consistent with baseline. BL INR elevated at 1.7.  Goal of Therapy:  aPTT 66-102 seconds Monitor platelets by anticoagulation protocol: Yes   10/8  0514 aPTT subthera x 1, HL 0.66 10/8  1504 aPTT therapeutic x 1 10/8  2253 aPTT 93, therapeutic x 2 10/9  0400 Heparin gtt held 10/9  1644 Heparin gtt resumed 1050 un/hr 10/9  2339 aPTT 61s; subthera; 1050>1150 un/hr 10/10 0858 aPTT 90s; HL 0.39   Hgb 8.5>7.6>7.9  Plan:  Therapeutic x1. Continue heparin infusion at 1150 units/hr Both aPTT and HL currently correlating. Will transition to HL dosing only Check confirmatory HL in 8 hours Continue to monitor H&H and platelets daily while on heparin gtt.  Pearla Dubonnet, PharmD Clinical Pharmacist 03/16/2022 1:15 PM

## 2022-03-16 NOTE — Progress Notes (Signed)
ANTICOAGULATION CONSULT NOTE  Pharmacy Consult for IV heparin Indication: DVT  Patient Measurements: Height: 4\' 9"  (144.8 cm) Weight: 73.3 kg (161 lb 9.6 oz) IBW/kg (Calculated) : 38.6 Heparin Dosing Weight: 55.4 kg  Labs: Recent Labs    03/14/22 0514 03/14/22 1504 03/14/22 2253 03/15/22 0521 03/15/22 2339 03/16/22 0521 03/16/22 0858 03/16/22 1702  HGB 8.5*  --   --  7.6*  --  7.9*  --   --   HCT 26.7*  --   --  23.3*  --  23.9*  --   --   PLT 285  --   --  313  --  333  --   --   APTT 54*   < > 93*  --  61*  --  90*  --   HEPARINUNFRC 0.66  --   --   --   --   --  0.39 0.21*  CREATININE 0.67  --   --  0.61  --  0.41*  --   --    < > = values in this interval not displayed.     Estimated Creatinine Clearance: 44.2 mL/min (A) (by C-G formula based on SCr of 0.41 mg/dL (L)).   Medical History: Past Medical History:  Diagnosis Date   Anxiety    Arthritis    Asthma    DVT (deep venous thrombosis) (HCC)    Edema    feet   Emphysema of lung (HCC)    GERD (gastroesophageal reflux disease)    Headache    MIGRAINES   History of orthopnea    Hypercholesterolemia    Hypothyroidism    NODULES   Lymphedema    Migraine    Non-small cell lung cancer, right ([place]) 05/2019   Rad tx's   Shortness of breath dyspnea    WITH EXERTION   Sleep apnea    MILD, DOES NOT USE CPAP   Thyroid nodule     Medications:  Heparin Dosing Weight: 55.4 kg Last dose of Eliquis 03/12/22 at 2000  Assessment: 81 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 Pt has IR procedure planned for 10/9 and pharmacy has been consulted for heparin in the perioperative period. Pt on apixaban currently for bilateral DVT from 03/04/2022.  BL aPTT 43. H&H stable. Hgb consistent with baseline. BL INR elevated at 1.7.  Goal of Therapy:  Heparin level 0.3-0.7 units/ml Monitor platelets by  anticoagulation protocol: Yes   10/8  0514 aPTT subthera x 1, HL 0.66 10/8  1504 aPTT therapeutic x 1 10/8  2253 aPTT 93, therapeutic x 2 10/9  0400 Heparin gtt held 10/9  1644 Heparin gtt resumed 1050 un/hr 10/9  2339 aPTT 61s; subthera; 1050 un/hr 10/10 0858 aPTT 90s; HL 0.39, thera x 1; 1150 un/hr  10/10 1702 HL 0.21, subthera, 1150 un/hr  Hgb 8.5>7.6>7.9  Plan:  Heparin level is subtherapeutic. Per MAR review, no interruptions in heparin infusion Heparin 800 unit IV bolus and increase heparin infusion to 1250 units/hr (22 units/kg/hr) Re-check HL 8 hours from rate change Continue to monitor H&H and platelets daily while on heparin gtt  Benita Gutter 03/16/2022 5:53 PM

## 2022-03-16 NOTE — Progress Notes (Signed)
ANTICOAGULATION CONSULT NOTE  Pharmacy Consult for IV heparin Indication: DVT  Allergies  Allergen Reactions   Eryc [Erythromycin]    Levofloxacin Nausea Only   Percocet [Oxycodone-Acetaminophen] Nausea And Vomiting   Augmentin [Amoxicillin-Pot Clavulanate] Rash   Celecoxib Palpitations    Patient Measurements: Height: 4\' 9"  (144.8 cm) Weight: 73.3 kg (161 lb 9.6 oz) IBW/kg (Calculated) : 38.6 Heparin Dosing Weight: 55.4 kg  Vital Signs: Temp: 98.4 F (36.9 C) (10/09 2022) Temp Source: Oral (10/09 2022) BP: 118/50 (10/09 2022) Pulse Rate: 102 (10/09 2022)  Labs: Recent Labs    03/13/22 1032 03/13/22 1346 03/13/22 1719 03/14/22 0514 03/14/22 1504 03/14/22 2253 03/15/22 0521 03/15/22 2339  HGB 9.2*  --   --  8.5*  --   --  7.6*  --   HCT 28.7*  --   --  26.7*  --   --  23.3*  --   PLT 311  --   --  285  --   --  313  --   APTT  --   --    < > 54* 85* 93*  --  61*  LABPROT  --  19.7*  --   --   --   --   --   --   INR  --  1.7*  --   --   --   --   --   --   HEPARINUNFRC  --   --   --  0.66  --   --   --   --   CREATININE 1.00  --   --  0.67  --   --  0.61  --    < > = values in this interval not displayed.     Estimated Creatinine Clearance: 44.2 mL/min (by C-G formula based on SCr of 0.61 mg/dL).   Medical History: Past Medical History:  Diagnosis Date   Anxiety    Arthritis    Asthma    DVT (deep venous thrombosis) (HCC)    Edema    feet   Emphysema of lung (HCC)    GERD (gastroesophageal reflux disease)    Headache    MIGRAINES   History of orthopnea    Hypercholesterolemia    Hypothyroidism    NODULES   Lymphedema    Migraine    Non-small cell lung cancer, right ([place]) 05/2019   Rad tx's   Shortness of breath dyspnea    WITH EXERTION   Sleep apnea    MILD, DOES NOT USE CPAP   Thyroid nodule     Medications:  Heparin Dosing Weight: 55.4 kg Last dose of Eliquis 03/12/22 @2000 .  Assessment: 81 yo F with PMH bilateral DVT presenting  with IAI (recent h/o small bowel obstruction complicated by perforation s/p colectomy and ileostomy with abscess). Pt has IR procedure planned for 10/9 and pharmacy has been consulted for heparin in the perioperative period. Pt on apixaban currently for bilateral DVT from 03/04/2022.  BL aPTT 43. H&H stable. Hgb consistent with baseline. BL INR elevated at 1.7.  Goal of Therapy:  aPTT 66-102 seconds Monitor platelets by anticoagulation protocol: Yes   10/8 0514 aPTT subthera x 1, HL 0.66 10/8 1504 aPTT therapeutic x 1 10/8 2253 aPTT 93, therapeutic x 2 10/9 0400 Heparin gtt held 10/9 1644 Heparin gtt resumed 1050 un/hr 10/9 2339 aPTT 61s; subthera; 1050>1150 un/hr  Hgb 8.5>7.6>___ (f/u with AM labs)  Plan: aPTT slightly subtherapeutic 1009 2339 at 61s (goal 66-102s). No  recent heparin level. Will bolus 800 units and increase heparin infusion at 1150 units/hr Check anti-Xa level in 8 hours with an aPTT to assess rate change and look for lab correlation.  If correlation noted, trend by HL alone. Transition to daily monitoring once consecutively therapeutic. Continue to monitor H&H and platelets daily while on heparin gtt.  Lorna Dibble, PharmD, [facility] Clinical Pharmacist 03/16/2022 12:48 AM

## 2022-03-16 NOTE — Consult Note (Incomplete)
[facility] Consult Note  MRN : [id number]  SHERLYNN TOURVILLE is a 81 y.o. (September 13, 1940) female who presents with chief complaint of  Chief Complaint  Patient presents with  . Abscess  .   Consulting Physician: Reason for consult: History of Present Illness: ***  Current Facility-Administered Medications  Medication Dose Route Frequency Provider Last Rate Last Admin  . acetaminophen (TYLENOL) tablet 650 mg  650 mg Oral Q6H PRN Cox, Amy N, DO       Or  . acetaminophen (TYLENOL) suppository 650 mg  650 mg Rectal Q6H PRN Cox, Amy N, DO      . ascorbic acid (VITAMIN C) tablet 500 mg  500 mg Oral BID Cox, Amy N, DO   500 mg at 03/16/22 2034  . cholecalciferol (VITAMIN D3) 25 MCG (1000 UNIT) tablet 5,000 Units  5,000 Units Oral Daily Cox, Amy N, DO   5,000 Units at 03/16/22 0830  . ciprofloxacin (CIPRO) IVPB 400 mg  400 mg Intravenous Q12H Cox, Amy N, DO   Stopped at 03/16/22 1158  . cyanocobalamin (VITAMIN B12) tablet 1,000 mcg  1,000 mcg Oral Daily Cox, Amy N, DO   1,000 mcg at 03/16/22 0830  . feeding supplement (ENSURE ENLIVE / ENSURE PLUS) liquid 237 mL  237 mL Oral TID BM Cox, Amy N, DO   237 mL at 03/16/22 2035  . fentaNYL (SUBLIMAZE) injection 25 mcg  25 mcg Intravenous Q2H PRN Cox, Amy N, DO   25 mcg at 03/15/22 2126  . heparin ADULT infusion 100 units/mL (25000 units/273mL)  1,250 Units/hr Intravenous Continuous Benita Gutter, RPH 12.5 mL/hr at 03/16/22 1840 1,250 Units/hr at 03/16/22 1840  . leptospermum manuka honey (MEDIHONEY) paste 1 Application  1 Application Topical Daily Wyvonnia Dusky, MD   1 Application at 29/51/88 1053  . loperamide (IMODIUM) capsule 2 mg  2 mg Oral TID Herbert Pun, MD   2 mg at 03/16/22 2034  . LORazepam (ATIVAN) tablet 0.5 mg  0.5 mg Oral Q6H PRN Wyvonnia Dusky, MD      . metroNIDAZOLE (FLAGYL) IVPB 500 mg  500 mg Intravenous BID Cox, Amy N, DO   Stopped at 03/16/22 1624  . mometasone-formoterol  (DULERA) 200-5 MCG/ACT inhaler 2 puff  2 puff Inhalation BID Cox, Amy N, DO   2 puff at 03/16/22 2035  . montelukast (SINGULAIR) tablet 10 mg  10 mg Oral QHS Cox, Amy N, DO   10 mg at 03/16/22 2036  . morphine (PF) 2 MG/ML injection 2 mg  2 mg Intravenous Q3H PRN Herbert Pun, MD      . ondansetron (ZOFRAN) tablet 4 mg  4 mg Oral Q6H PRN Cox, Amy N, DO       Or  . ondansetron (ZOFRAN) injection 4 mg  4 mg Intravenous Q6H PRN Cox, Amy N, DO      . pantoprazole (PROTONIX) EC tablet 40 mg  40 mg Oral BID AC Cox, Amy N, DO   40 mg at 03/16/22 1648  . senna-docusate (Senokot-S) tablet 1 tablet  1 tablet Oral QHS PRN Cox, Amy N, DO      . sodium chloride flush (NS) 0.9 % injection 10-40 mL  10-40 mL Intracatheter Q12H Wyvonnia Dusky, MD   10 mL at 03/16/22 2036  . sodium chloride flush (NS) 0.9 % injection 10-40 mL  10-40 mL Intracatheter PRN Wyvonnia Dusky, MD      . sodium chloride flush (NS)  0.9 % injection 5 mL  5 mL Intracatheter Q8H Corrie Mckusick, DO   5 mL at 03/16/22 2034  . traMADol (ULTRAM) tablet 50 mg  50 mg Oral Q6H PRN Cox, Amy N, DO   50 mg at 03/16/22 0449    Past Medical History:  Diagnosis Date  . Anxiety   . Arthritis   . Asthma   . DVT (deep venous thrombosis) ([place])   . Edema    feet  . Emphysema of lung ([place])   . GERD (gastroesophageal reflux disease)   . Headache    MIGRAINES  . History of orthopnea   . Hypercholesterolemia   . Hypothyroidism    NODULES  . Lymphedema   . Migraine   . Non-small cell lung cancer, right ([place]) 05/2019   Rad tx's  . Shortness of breath dyspnea    WITH EXERTION  . Sleep apnea    MILD, DOES NOT USE CPAP  . Thyroid nodule     Past Surgical History:  Procedure Laterality Date  . ABDOMINAL HYSTERECTOMY    . BREAST BIOPSY    . CARDIAC CATHETERIZATION    . CATARACT EXTRACTION W/PHACO Left 02/12/2016   Procedure: CATARACT EXTRACTION PHACO AND INTRAOCULAR LENS PLACEMENT (IOC);  Surgeon: Eulogio Bear, MD;   Location: [facility] ORS;  Service: Ophthalmology;  Laterality: Left;  [place] 01:30 AP% 15.2 CDE 13.71 Fluid pack lot # [id number]  . CATARACT EXTRACTION W/PHACO Right 03/18/2016   Procedure: CATARACT EXTRACTION PHACO AND INTRAOCULAR LENS PLACEMENT ([place]);  Surgeon: Eulogio Bear, MD;  Location: [facility] ORS;  Service: Ophthalmology;  Laterality: Right;  Lot # [id number] [place]: 01:05.5 AP%:14.3 CDE: 9.33  . COLECTOMY WITH COLOSTOMY CREATION/HARTMANN PROCEDURE N/A 01/20/2022   Procedure: COLECTOMY WITH COLOSTOMY CREATION/HARTMANN PROCEDURE;  Surgeon: Herbert Pun, MD;  Location: [facility] ORS;  Service: General;  Laterality: N/A;  . FLEXIBLE SIGMOIDOSCOPY N/A 01/14/2022   Procedure: FLEXIBLE SIGMOIDOSCOPY;  Surgeon: Toledo, Benay Pike, MD;  Location: [facility] END[facility]COPY;  Service: Gastroenterology;  Laterality: N/A;  . FLEXIBLE SIGMOIDOSCOPY N/A 01/19/2022   Procedure: FLEXIBLE SIGMOIDOSCOPY;  Surgeon: Lesly Rubenstein, MD;  Location: [facility] ENDOSCOPY;  Service: Endoscopy;  Laterality: N/A;  . FRACTURE SURGERY Left    arm rod and screw  . IR RADIOLOGIST EVAL & MGMT  03/04/2022  . KNEE ARTHROSCOPY    . LAPAROTOMY N/A 01/24/2022   Procedure: EXPLORATORY LAPAROTOMY;  Surgeon: Herbert Pun, MD;  Location: [facility] ORS;  Service: General;  Laterality: N/A;  . OOPHORECTOMY    . RCR    . ROTATOR CUFF REPAIR Left 2006    Social History Social History   Tobacco Use  . Smoking status: Never  . Smokeless tobacco: Never  Substance Use Topics  . Alcohol use: No  . Drug use: Never    Family History Family History  Problem Relation Age of Onset  . Emphysema Mother   . Heart Problems Mother   . Tuberculosis Father   . Brain cancer Father   . Skin cancer Father   . Breast cancer Sister   . Irritable bowel syndrome Sister   . Lung cancer Brother   . Dementia Sister   . Schizophrenia Sister     Allergies  Allergen Reactions  . Eryc [Erythromycin]   . Levofloxacin Nausea Only  . Percocet  [Oxycodone-Acetaminophen] Nausea And Vomiting  . Augmentin [Amoxicillin-Pot Clavulanate] Rash  . Celecoxib Palpitations     REVIEW OF SYSTEMS (Negative unless checked)  Constitutional: [] Weight loss  [] Fever  [] Chills Cardiac: [] Chest  pain   [] Chest pressure   [] Palpitations   [] Shortness of breath when laying flat   [] Shortness of breath at rest   [] Shortness of breath with exertion. Vascular:  [] Pain in legs with walking   [] Pain in legs at rest   [] Pain in legs when laying flat   [] Claudication   [] Pain in feet when walking  [] Pain in feet at rest  [] Pain in feet when laying flat   [] History of DVT   [] Phlebitis   [] Swelling in legs   [] Varicose veins   [] Non-healing ulcers Pulmonary:   [] Uses home oxygen   [] Productive cough   [] Hemoptysis   [] Wheeze  [] COPD   [] Asthma Neurologic:  [] Dizziness  [] Blackouts   [] Seizures   [] History of stroke   [] History of TIA  [] Aphasia   [] Temporary blindness   [] Dysphagia   [] Weakness or numbness in arms   [] Weakness or numbness in legs Musculoskeletal:  [] Arthritis   [] Joint swelling   [] Joint pain   [] Low back pain Hematologic:  [] Easy bruising  [] Easy bleeding   [] Hypercoagulable state   [] Anemic  [] Hepatitis Gastrointestinal:  [] Blood in stool   [] Vomiting blood  [] Gastroesophageal reflux/heartburn   [] Difficulty swallowing. Genitourinary:  [] Chronic kidney disease   [] Difficult urination  [] Frequent urination  [] Burning with urination   [] Blood in urine Skin:  [] Rashes   [] Ulcers   [] Wounds Psychological:  [] History of anxiety   []  History of major depression.  Physical Examination  Vitals:   03/16/22 0547 03/16/22 0829 03/16/22 1641 03/16/22 2043  BP: (!) 104/52 (!) 104/59 (!) 97/58 (!) 111/53  Pulse: 99 99 100 (!) 101  Resp: 17 17 16 15   Temp: 98.3 F (36.8 C) 97.9 F (36.6 C) 98.1 F (36.7 C) 98.2 F (36.8 C)  TempSrc: Oral Oral Oral Oral  SpO2: 96% 99% 99% 99%  Weight:      Height:       Body mass index is 34.97 kg/m. Gen:   WD/WN, NAD Head: [place]/AT, No temporalis wasting. Prominent temp pulse not noted. Ear/Nose/Throat: Hearing grossly intact, nares w/o erythema or drainage, oropharynx w/o Erythema/Exudate Eyes: Sclera non-icteric, conjunctiva clear Neck: Trachea midline.  No JVD.  Pulmonary:  Good air movement, respirations not labored, equal bilaterally.  Cardiac: RRR, normal S1, S2. Vascular: *** Vessel Right Left  Radial Palpable Palpable  Ulnar Palpable Palpable  Brachial Palpable Palpable  Carotid Palpable, without bruit Palpable, without bruit  Aorta Not palpable N/A  Femoral Palpable Palpable  Popliteal Palpable Palpable  PT Palpable Palpable  DP Palpable Palpable   Gastrointestinal: soft, non-tender/non-distended. No guarding/reflex.  Musculoskeletal: M/S 5/5 throughout.  Extremities without ischemic changes.  No deformity or atrophy. No edema. Neurologic: Sensation grossly intact in extremities.  Symmetrical.  Speech is fluent. Motor exam as listed above. Psychiatric: Judgment intact, Mood & affect appropriate for pt's clinical situation. Dermatologic: No rashes or ulcers noted.  No cellulitis or open wounds. Lymph : No Cervical, Axillary, or Inguinal lymphadenopathy.    CBC Lab Results  Component Value Date   WBC 11.0 (H) 03/16/2022   HGB 7.9 (L) 03/16/2022   HCT 23.9 (L) 03/16/2022   MCV 84.8 03/16/2022   PLT 333 03/16/2022    BMET    Component Value Date/Time   NA 130 (L) 03/16/2022 0521   NA 136 10/16/2012 1600   K 3.5 03/16/2022 0521   K 3.6 10/16/2012 1600   CL 105 03/16/2022 0521   CL 107 10/16/2012 1600   CO2 19 (L) 03/16/2022  0521   CO2 23 10/16/2012 1600   GLUCOSE 108 (H) 03/16/2022 0521   GLUCOSE 111 (H) 10/16/2012 1600   BUN <5 (L) 03/16/2022 0521   BUN 14 10/16/2012 1600   CREATININE 0.41 (L) 03/16/2022 0521   CREATININE 0.85 10/16/2012 1600   CALCIUM 8.4 (L) 03/16/2022 0521   CALCIUM 8.6 10/16/2012 1600   GFRNONAA >60 03/16/2022 0521   GFRNONAA >60  10/16/2012 1600   GFRAA >60 07/25/2019 1020   GFRAA >60 10/16/2012 1600   Estimated Creatinine Clearance: 44.2 mL/min (A) (by C-G formula based on SCr of 0.41 mg/dL (L)).  COAG Lab Results  Component Value Date   INR 1.7 (H) 03/13/2022   INR 1.2 02/18/2022   INR 1.3 (H) 02/17/2022    Radiology CT GUIDED PERITONEAL/RETROPERITONEAL FLUID DRAIN BY PERC CATH  Result Date: 03/15/2022 INDICATION: 81 year old female with multifocal fluid in collections, status post surgery, referred for drainage EXAM: CT-GUIDED ASPIRATION RIGHT LOWER QUADRANT FLUID CT-GUIDED DRAINAGE LEFT LOWER QUADRANT FLUID MEDICATIONS: The patient is currently admitted to the hospital and receiving intravenous antibiotics. The antibiotics were administered within an appropriate time frame prior to the initiation of the procedure. ANESTHESIA/SEDATION: Moderate (conscious) sedation was employed during this procedure. A total of Versed 1.0 mg and Fentanyl 50 mcg was administered intravenously by the radiology nurse. Total intra-service moderate Sedation Time: 39 minutes. The patient's level of consciousness and vital signs were monitored continuously by radiology nursing throughout the procedure under my direct supervision. COMPLICATIONS: None PROCEDURE: Informed written consent was obtained from the patient after a thorough discussion of the procedural risks, benefits and alternatives. All questions were addressed. Maximal Sterile Barrier Technique was utilized including caps, mask, sterile gowns, sterile gloves, sterile drape, hand hygiene and skin antiseptic. A timeout was performed prior to the initiation of the procedure. Patient was positioned supine position on the CT gantry table. Scout CT acquired for planning purposes. We first addressed the right lower quadrant fluid. The patient was then prepped and draped in the usual sterile fashion. 1% lidocaine was used for local anesthesia. Using CT guidance, a trocar needle was advanced  into the small fluid and gas collection of the right lower quadrant, lateral to the psoas muscle. Once we confirmed needle tip position, we aspirated approximately 5 cc of purulent fluid. A drain was not placed given the small size of this fluid collection. Sterile dressing was placed.  Sample was sent for culture. We then address the left lower quadrant fluid. The patient was repositioned into left anterior oblique position. Scout CT acquired for planning purposes. The patient was then prepped and draped in the usual sterile fashion. 1% lidocaine was used for local anesthesia. Using CT guidance, trocar needle was advanced into the fluid collection of the left lower quadrant. Using modified Seldinger technique, a 10 French drain was placed into the collection. Approximately 20 cc of frankly purulent material was aspirated. Sample sent for culture. The drain was sutured in position and attached to bulb drainage. Final CT was acquired. Patient tolerated the procedure well and remained hemodynamically stable throughout. No complications were encountered and no significant blood loss. IMPRESSION: Status post CT-guided aspiration of right lower quadrant fluid and drainage of left lower quadrant fluid. Signed, Dulcy Fanny. Nadene Rubins, RPVI [facility] Electronically Signed   By: Corrie Mckusick D.O.   On: 03/15/2022 14:57   CT ASPIRATION N/S  Result Date: 03/15/2022 INDICATION: 81 year old female with multifocal fluid in collections, status post surgery,  referred for drainage EXAM: CT-GUIDED ASPIRATION RIGHT LOWER QUADRANT FLUID CT-GUIDED DRAINAGE LEFT LOWER QUADRANT FLUID MEDICATIONS: The patient is currently admitted to the hospital and receiving intravenous antibiotics. The antibiotics were administered within an appropriate time frame prior to the initiation of the procedure. ANESTHESIA/SEDATION: Moderate (conscious) sedation was employed during this  procedure. A total of Versed 1.0 mg and Fentanyl 50 mcg was administered intravenously by the radiology nurse. Total intra-service moderate Sedation Time: 39 minutes. The patient's level of consciousness and vital signs were monitored continuously by radiology nursing throughout the procedure under my direct supervision. COMPLICATIONS: None PROCEDURE: Informed written consent was obtained from the patient after a thorough discussion of the procedural risks, benefits and alternatives. All questions were addressed. Maximal Sterile Barrier Technique was utilized including caps, mask, sterile gowns, sterile gloves, sterile drape, hand hygiene and skin antiseptic. A timeout was performed prior to the initiation of the procedure. Patient was positioned supine position on the CT gantry table. Scout CT acquired for planning purposes. We first addressed the right lower quadrant fluid. The patient was then prepped and draped in the usual sterile fashion. 1% lidocaine was used for local anesthesia. Using CT guidance, a trocar needle was advanced into the small fluid and gas collection of the right lower quadrant, lateral to the psoas muscle. Once we confirmed needle tip position, we aspirated approximately 5 cc of purulent fluid. A drain was not placed given the small size of this fluid collection. Sterile dressing was placed.  Sample was sent for culture. We then address the left lower quadrant fluid. The patient was repositioned into left anterior oblique position. Scout CT acquired for planning purposes. The patient was then prepped and draped in the usual sterile fashion. 1% lidocaine was used for local anesthesia. Using CT guidance, trocar needle was advanced into the fluid collection of the left lower quadrant. Using modified Seldinger technique, a 10 French drain was placed into the collection. Approximately 20 cc of frankly purulent material was aspirated. Sample sent for culture. The drain was sutured in position and  attached to bulb drainage. Final CT was acquired. Patient tolerated the procedure well and remained hemodynamically stable throughout. No complications were encountered and no significant blood loss. IMPRESSION: Status post CT-guided aspiration of right lower quadrant fluid and drainage of left lower quadrant fluid. Signed, Dulcy Fanny. Nadene Rubins, RPVI [facility] Radiology Electronically Signed   By: Corrie Mckusick D.O.   On: 03/15/2022 14:57   DG Chest 2 View  Result Date: 03/13/2022 CLINICAL DATA:  Suspected sepsis EXAM: CHEST - 2 VIEW COMPARISON:  Chest x-ray February 17, 2022 FINDINGS: Stable cardiomediastinal contours. Unchanged calcified atherosclerosis of the aortic arch. Both lungs are clear. The visualized skeletal structures are unremarkable. IMPRESSION: No acute cardiopulmonary abnormality. Electronically Signed   By: Beryle Flock M.D.   On: 03/13/2022 10:56   CT ABDOMEN PELVIS W CONTRAST  Result Date: 03/11/2022 CLINICAL DATA:  Abdominal pain with persistent leakage from ileostomy site status post partial colectomy and ileostomy 01/20/2022. History of lung cancer. EXAM: CT ABDOMEN AND PELVIS WITH CONTRAST TECHNIQUE: Multidetector CT imaging of the abdomen and pelvis was performed using the standard protocol following bolus administration of intravenous contrast. RADIATION DOSE REDUCTION: This exam was performed according to the departmental dose-optimization program which includes automated exposure control, adjustment of the mA and/or kV according to patient size and/or use of iterative reconstruction technique. CONTRAST:  184mL OMNIPAQUE IOHEXOL 300 MG/ML  SOLN COMPARISON:  Abdominopelvic  CT 03/04/2022 and 02/17/2022. FINDINGS: Lower chest: Stable appearance of the lung bases, without acute findings. No significant pleural effusion. Hepatobiliary: The liver is normal in density without suspicious focal abnormality. No evidence of  gallstones, gallbladder wall thickening or biliary dilatation. Pancreas: Unremarkable. No pancreatic ductal dilatation or surrounding inflammatory changes. Spleen: Normal in size without focal abnormality. Adrenals/Urinary Tract: Both adrenal glands appear normal. No evidence of urinary tract calculus, suspicious renal lesion or hydronephrosis. The bladder appears unremarkable for its degree of distention. Stomach/Bowel: Enteric contrast was administered and has passed through the right lower quadrant ileostomy. The stomach appears unremarkable for its degree of distention. No small bowel wall thickening or surrounding inflammation. Status post subtotal colectomy with stable appearance of the [facility] pouch. Vascular/Lymphatic: Extensive nonocclusive bilateral femoral vein deep venous thrombosis again noted, similar to the most recent study. No significant iliac vein extension. The IVC is patent. Aortic and branch vessel atherosclerosis without evidence of aneurysm. There are no enlarged abdominopelvic lymph nodes. Reproductive: Hysterectomy.  No adnexal mass. Other: Bilateral percutaneous pelvic drains have been removed in the interval. There is a small recurrent air-fluid collection on the right anterior to the iliacus muscle, measuring 2.8 x 2.9 cm transverse on image 57/2 and up to 5.5 cm on sagittal image 48/6. There is no contrast material within this collection. There is a larger left-sided air-fluid collection which measures up to 5.3 x 2.2 cm on axial image 62/2 and extends at least 7.9 cm on coronal image 50/5, extending inferiorly to the vaginal cuff. There is no contrast material within this collection. A small amount of gas remains within the midline abdominal incision without definite enteric contrast. Superficial enteric contrast at the umbilicus appears spilled from the ileostomy. No free air. No ascites. Musculoskeletal: No acute or significant osseous findings. Multilevel thoracolumbar spondylosis.  Degenerative changes of both hips. Diffuse pelvic muscular atrophy. IMPRESSION: 1. Interval removal of bilateral percutaneous pelvic drains. There are small recurrent air-fluid collections in both sides of the pelvis which do not contain contrast material, suspicious for small abscesses. No free air or ascites. 2. No evidence of bowel obstruction or ileus. 3. 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 4. 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 5.  Aortic Atherosclerosis (ICD10-I70.0). 6. These results will be called to the ordering clinician or representative by the Radiologist Assistant, and communication documented in the PACS or [employer]. Electronically Signed   By: Richardean Sale M.D.   On: 03/11/2022 14:33   US Venous Img Lower Bilateral (DVT)  Result Date: 03/04/2022 CLINICAL DATA:  81 year old female with common femoral DVT identified on CTA earlier today. EXAM: BILATERAL LOWER EXTREMITY VENOUS DOPPLER ULTRASOUND TECHNIQUE: Gray-scale sonography with compression, as well as color and duplex ultrasound, were performed to evaluate the deep venous system(s) from the level of the common femoral vein through the popliteal and proximal calf veins. COMPARISON:  None Available. FINDINGS: VENOUS Nonocclusive thrombus within the RIGHT common femoral vein noted as well as occlusive thrombus within the RIGHT femoral, popliteal and calf veins. Nonocclusive thrombus within the common femoral, femoral and posterior tibial veins are noted. OTHER None. Limitations: none IMPRESSION: Bilateral LOWER extremity DVT involving bilateral common femoral, femoral, RIGHT popliteal and calf veins. Occlusive thrombus is noted within the RIGHT femoral, popliteal and calf veins. Clinical team is aware of LOWER extremity DVT from CT earlier today. This report will be called to the referring clinician by sonography staff. Electronically Signed   By: Dellis Filbert  Hu M.D.   On:  03/04/2022 17:24   IR Radiologist Eval & Mgmt  Result Date: 03/04/2022 EXAM: ESTABLISHED PATIENT OFFICE VISIT CHIEF COMPLAINT: See epic note. HISTORY OF PRESENT ILLNESS: See epic note. REVIEW OF SYSTEMS: See epic note. PHYSICAL EXAMINATION: See epic note. ASSESSMENT AND PLAN: See epic note. Ruthann Cancer, MD [facility] and Interventional Radiology Specialists [facility] Radiology Electronically Signed   By: Ruthann Cancer M.D.   On: 03/04/2022 09:57   DG Sinus/Fist Tube Chk-Non GI  Result Date: 03/04/2022 CLINICAL DATA:  81 year old female with history of subtotal colectomy and end ileostomy complicated by intra-abdominal fluid collection status post percutaneous drain placement on 02/18/2022. The patient presents to drain clinic with trace output from the drains and CT evidence of resolution of previously visualized fluid collections. EXAM: ABSCESS INJECTION COMPARISON:  None Available. CONTRAST:  10 mL Omnipaque 300-administered via the existing percutaneous drain. FLUOROSCOPY TIME:  1.5 mGy TECHNIQUE: The patient was positioned supine on the fluoroscopy table. A preprocedural spot fluoroscopic image was obtained of the pelvis and the existing percutaneous drainage catheters. Multiple spot fluoroscopic and radiographic images were obtained following the injection of a small amount of contrast via the existing percutaneous drainage catheters. The external portion of the drains were cut to release inter pigtails. The retention sutures were removed. The drains were removed successfully without complication. FINDINGS: Resolution of previously visualized fluid collections. No evidence of enteric fistula. The drains were removed successfully. IMPRESSION: Resolution of previously visualized fluid collections without evidence of enteric fistula. The drains were removed successfully. Ruthann Cancer, MD [facility] and Interventional Radiology Specialists [facility] Radiology Electronically Signed   By: Ruthann Cancer M.D.    On: 03/04/2022 09:25   CT ABDOMEN PELVIS W CONTRAST  Result Date: 03/04/2022 CLINICAL DATA:  81 year old female with history of lower abdominal abscesses status post percutaneous drain placement on 02/18/2022. The patient presents with minimal output from both drains. EXAM: CT ABDOMEN AND PELVIS WITH CONTRAST TECHNIQUE: Multidetector CT imaging of the abdomen and pelvis was performed using the standard protocol following bolus administration of intravenous contrast. RADIATION DOSE REDUCTION: This exam was performed according to the departmental dose-optimization program which includes automated exposure control, adjustment of the mA and/or kV according to patient size and/or use of iterative reconstruction technique. CONTRAST:  130mL ISOVUE-300 IOPAMIDOL (ISOVUE-300) INJECTION 61% COMPARISON:  02/17/2022, 02/18/2022 FINDINGS: Lower chest: No acute abnormality. Hepatobiliary: No focal liver abnormality is seen. No gallstones, gallbladder wall thickening, or biliary dilatation. Pancreas: Unremarkable. No pancreatic ductal dilatation or surrounding inflammatory changes. Spleen: Normal in size without focal abnormality. Adrenals/Urinary Tract: Adrenal glands are unremarkable. Kidneys are normal, without renal calculi, focal lesion, or hydronephrosis. Bladder is unremarkable. Stomach/Bowel: Stomach is within normal limits. Similar appearing postsurgical changes after partial colectomy. 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 No evidence of bowel wall thickening, distention, or inflammatory changes. Vascular/Lymphatic: Nonocclusive deep vein thrombosis extending to the central aspect of the common femoral veins bilaterally. No evidence of significant iliac extension or evidence of ileo caval thrombosis. Similar-appearing atherosclerotic calcifications are noted peer no abdominopelvic lymphadenopathy. Reproductive: Status post hysterectomy. No adnexal masses. Other: Significant  interval healing of previously visualized incisional hernia and left lower quadrant laparoscopy port site. Musculoskeletal: Unchanged multilevel degenerative changes of the visualized thoracolumbar spine. No acute osseous abnormality. IMPRESSION: 1. Nonocclusive bilateral femoral deep vein thrombosis. No evidence of iliocaval extension. Recommend bilateral lower extremity venous ultrasound for further characterization. 2. Interval resolution of previously visualized bilateral lower abdominal fluid collections  with unchanged position of the indwelling drains. 3. Postsurgical changes after subtotal colectomy and right lower quadrant in ileostomy without complicating features. These results will be called to the ordering clinician or representative by the Radiologist Assistant, and communication documented in the PACS or [employer]. Ruthann Cancer, MD [facility] Radiology Electronically Signed   By: Ruthann Cancer M.D.   On: 03/04/2022 09:08   CT GUIDED PERITONEAL/RETROPERITONEAL FLUID DRAIN BY PERC CATH  Result Date: 02/18/2022 INDICATION: 81 year old with recent abdominal surgeries including subtotal colectomy with an end ileostomy. Recent CT demonstrates intra-abdominal abscesses. Plan for CT-guided drainage of the intra-abdominal abscesses. EXAM: CT-guided drain placement in right lower abdominal/pelvic abscess CT-guided drain placement in left lower abdominal/pelvic abscess. MEDICATIONS: Moderate sedation ANESTHESIA/SEDATION: Moderate (conscious) sedation was employed during this procedure. A total of Versed 1.0mg  and fentanyl 75 mcg was administered intravenously at the order of the provider performing the procedure. Total intra-service moderate sedation time: 32 minutes. Patient's level of consciousness and vital signs were monitored continuously by radiology nurse throughout the procedure under the supervision of the provider performing the procedure.  COMPLICATIONS: None immediate. PROCEDURE: Informed written consent was obtained from the patient after a thorough discussion of the procedural risks, benefits and alternatives. All questions were addressed. Maximal Sterile Barrier Technique was utilized including caps, mask, sterile gowns, sterile gloves, sterile drape, hand hygiene and skin antiseptic. A timeout was performed prior to the initiation of the procedure. Patient was placed supine on the CT scanner. Images through the lower abdomen and pelvis were obtained. Abscess collections situated between the right psoas and right iliacus muscle targeted. The right side of the lower abdomen was prepped with chlorhexidine and sterile field was created. Skin was anesthetized with 1% lidocaine. A small incision was made. Using CT guidance, an 18 gauge trocar needle was directed into the collection adjacent to the right psoas muscle. Superstiff Amplatz wire was placed but the wire went into smaller collection anterior to the larger abscess collection. As a result, this wire and needle were removed. A new incision was made in order to target the larger abscess collection. Using CT guidance, the 18 gauge needle was directed into the abscess and yellow purulent fluid was aspirated. Superstiff Amplatz wire was placed. Tract was dilated to accommodate a 10 [place] multipurpose drain and approximately 20 mL of purulent fluid was removed. Drain was attached to a suction bulb and sutured to skin. Attention was directed to the abscess in the left lower abdomen. Left lower abdomen was prepped with chlorhexidine and sterile field was created. Skin was anesthetized using 1% lidocaine. Small incision was made. Using CT guidance, an 18 gauge trocar needle was directed into the air-fluid collection and purulent fluid was aspirated. Superstiff Amplatz wire was placed and the tract was dilated to accommodate a 10 [place] multipurpose drain. 20 mL of thick yellow purulent fluid was  removed. Follow up CT images were obtained. Drain was attached to a suction bulb and sutured to skin. Fluid samples were sent for culture. RADIATION DOSE REDUCTION: This exam was performed according to the departmental dose-optimization program which includes automated exposure control, adjustment of the mA and/or kV according to patient size and/or use of iterative reconstruction technique. FINDINGS: Multiple air-fluid collections in the right lower abdomen. Largest abscess was situated between the right psoas muscle and right iliacus muscle. Purulent fluid was removed from the largest collection. Drain was placed in the largest abscess collection. Drain was successfully placed in the air-fluid  collection in the left lower abdomen adjacent to the left psoas muscle. 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 IMPRESSION: Successful placement of a CT-guided drain in the left lower abdominal abscess and CT-guided placement of a drain in the right lower abdominal abscess. Electronically Signed   By: Markus Daft M.D.   On: 02/18/2022 21:23   CT GUIDED PERITONEAL/RETROPERITONEAL FLUID DRAIN BY PERC CATH  Result Date: 02/18/2022 INDICATION: 81 year old with recent abdominal surgeries including subtotal colectomy with an end ileostomy. Recent CT demonstrates intra-abdominal abscesses. Plan for CT-guided drainage of the intra-abdominal abscesses. EXAM: CT-guided drain placement in right lower abdominal/pelvic abscess CT-guided drain placement in left lower abdominal/pelvic abscess. MEDICATIONS: Moderate sedation ANESTHESIA/SEDATION: Moderate (conscious) sedation was employed during this procedure. A total of Versed 1.0mg  and fentanyl 75 mcg was administered intravenously at the order of the provider performing the procedure. Total  intra-service moderate sedation time: 32 minutes. Patient's level of consciousness and vital signs were monitored continuously by radiology nurse throughout the procedure under the supervision of the provider performing the procedure. COMPLICATIONS: None immediate. PROCEDURE: Informed written consent was obtained from the patient after a thorough discussion of the procedural risks, benefits and alternatives. All questions were addressed. Maximal Sterile Barrier Technique was utilized including caps, mask, sterile gowns, sterile gloves, sterile drape, hand hygiene and skin antiseptic. A timeout was performed prior to the initiation of the procedure. Patient was placed supine on the CT scanner. Images through the lower abdomen and pelvis were obtained. Abscess collections situated between the right psoas and right iliacus muscle targeted. The right side of the lower abdomen was prepped with chlorhexidine and sterile field was created. Skin was anesthetized with 1% lidocaine. A small incision was made. Using CT guidance, an 18 gauge trocar needle was directed into the collection adjacent to the right psoas muscle. Superstiff Amplatz wire was placed but the wire went into smaller collection anterior to the larger abscess collection. As a result, this wire and needle were removed. A new incision was made in order to target the larger abscess collection. Using CT guidance, the 18 gauge needle was directed into the abscess and yellow purulent fluid was aspirated. Superstiff Amplatz wire was placed. Tract was dilated to accommodate a 10 [place] multipurpose drain and approximately 20 mL of purulent fluid was removed. Drain was attached to a suction bulb and sutured to skin. Attention was directed to the abscess in the left lower abdomen. Left lower abdomen was prepped with chlorhexidine and sterile field was created. Skin was anesthetized using 1% lidocaine. Small incision was made. Using CT guidance, an 18 gauge trocar  needle was directed into the air-fluid collection and purulent fluid was aspirated. Superstiff Amplatz wire was placed and the tract was dilated to accommodate a 10 [place] multipurpose drain. 20 mL of thick yellow purulent fluid was removed. Follow up CT images were obtained. Drain was attached to a suction bulb and sutured to skin. Fluid samples were sent for culture. RADIATION DOSE REDUCTION: This exam was performed according to the departmental dose-optimization program which includes automated exposure control, adjustment of the mA and/or kV according to patient size and/or use of iterative reconstruction technique. FINDINGS: Multiple air-fluid collections in the right lower abdomen. Largest abscess was situated between the right psoas muscle and right iliacus muscle. Purulent fluid was removed from the largest collection.  Drain was placed in the largest abscess collection. Drain was successfully placed in the air-fluid collection in the left lower abdomen adjacent to the left psoas muscle. 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 IMPRESSION: Successful placement of a CT-guided drain in the left lower abdominal abscess and CT-guided placement of a drain in the right lower abdominal abscess. Electronically Signed   By: Markus Daft M.D.   On: 02/18/2022 21:23   CT Abdomen Pelvis W Contrast  Result Date: 02/17/2022 CLINICAL DATA:  Sigmoid hemicolectomy 01/20/2022. Repair of cecal perforation. Loop ileostomy creation. Drains removed last week. Abdominal pain. Elevated white blood cell count. EXAM: CT ABDOMEN AND PELVIS WITH CONTRAST TECHNIQUE: Multidetector CT imaging of the abdomen and pelvis was performed using the standard protocol following bolus administration of intravenous contrast. RADIATION DOSE REDUCTION: This exam was  performed according to the departmental dose-optimization program which includes automated exposure control, adjustment of the mA and/or kV according to patient size and/or use of iterative reconstruction technique. CONTRAST:  160mL OMNIPAQUE IOHEXOL 300 MG/ML  SOLN COMPARISON:  02/01/2022 FINDINGS: Lower chest: Motion degradation within the lower chest. Tiny bibasilar pulmonary nodules are most likely incidental/benign and do not warrant imaging follow-up per consensus criteria. Example at maximally 3 mm.Mild cardiomegaly, without pericardial or pleural effusion. Hepatobiliary: Mild motion degradation continuing into the upper abdomen. Normal liver. The gallbladder is underdistended, without calcified stone or specific evidence of acute cholecystitis. Pancreas: Normal, without mass or ductal dilatation. Spleen: Normal in size, without focal abnormality. Adrenals/Urinary Tract: Normal adrenal glands. Normal kidneys, without hydronephrosis. Normal urinary bladder. Stomach/Bowel: Normal stomach, without wall thickening. Interval Hartmann's pouch creation and new complete colectomy. Right lower quadrant ileostomy again identified. Ill-defined right pelvic fluid and gas of 6.0 x 5.1 cm on 57/2 is contiguous with the rectal stump, new since 01/24/2022. ill-defined left pelvic fluid and gas collection measures 6.7 x 4.3 cm on 57/2, also likely tracking from the rectal stump including on 63/2. Extends to the left adnexa and superior aspect of the left side of the vaginal cuff including on 66/2. More ill-defined gas within the ileocolic mesentery including on 50/2. Vascular/Lymphatic: Aortic atherosclerosis. 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 No abdominopelvic adenopathy. Reproductive: Hysterectomy.  No adnexal mass. Other: No free pelvic fluid. Pelvic midline laparotomy included on 66/2. Hyperattenuation material within could represent contrast. Gas just deep to the laparotomy site on 63/2 is intimately associated  with the adjacent bowel loops. No well-defined fluid collection. There is also midline abdominal wall presumably laparotomy induced gas on 49/2. Musculoskeletal: No acute osseous abnormality. Grade 1 L3-4 anterolisthesis with advanced lumbosacral spondylosis. IMPRESSION: 1. Minimally motion degraded exam. 2. Interval near complete colectomy with rectal stump creation. Bilateral ill-defined gas and fluid collections within the pelvis, most consistent with abscesses. These track from the rectal stump site, and anastomotic leak cannot be excluded. 3. Abdominopelvic laparotomy sites. Gas deep to pelvic surgical sutures is intimately associated with adjacent bowel and fistulous communication cannot be excluded (especially given hyperattenuating material within the defect, possibly contrast). 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 Electronically Signed   By: Abigail Miyamoto M.D.   On: 02/17/2022 18:08   DG Chest 2 View  Result Date: 02/17/2022 CLINICAL DATA:  Suspected sepsis EXAM: CHEST - 2 VIEW COMPARISON:  11/29/2020 FINDINGS: Linear atelectasis in the right upper lobe. No  confluent airspace opacities otherwise. Heart is normal size. Mediastinal contours within normal limits. Aortic atherosclerosis. No effusions or acute bony abnormality. IMPRESSION: Platelike right upper lobe atelectasis. No active disease. Electronically Signed   By: Rolm Baptise M.D.   On: 02/17/2022 17:09      Assessment/Plan 1. *** 2. *** 3. ***  Plan of care discussed with *** and he is in agreement with plan noted above.   Family Communication:  Total Time:*** I spent 75 minutes in this encounter including personally reviewing extensive medical records, personally reviewing imaging studies and compared to prior scans, counseling the patient, placing orders, coordinating care and performing appropriate documentation  Thank you for allowing [place] to participate in the care of this patient.   Kris Hartmann, NP [facility] [phone] (Office Phone) [phone] (Office Fax) [phone] (Pager)  03/16/2022 11:08 PM  Staff may message me via secure chat in [place]  but this may not receive immediate response,  please page for urgent matters!  Dictation software was used to generate the above note. Typos may occur and escape review, as with typed/written notes. Any error is purely unintentional.  Please contact me directly for clarity if needed.

## 2022-03-16 NOTE — Consult Note (Addendum)
[facility] Consult Note  MRN : [id number]  Lindsey Thomas is a 81 y.o. (Feb 23, 1941) female who presents with chief complaint of  Chief Complaint  Patient presents with   Abscess  .   Consulting Physician:Edgardo Windell Moment, MD Reason for consult: Bilateral femoral popliteal DVT History of Present Illness: Lindsey Thomas is an 81 year old female who presented to [facility] on 03/13/2022 due to an intra-abdominal infection.  She was noted to have bloody purulent drainage of her prior JP drain site and in the midst of work-up she was found to have bilateral femoral-popliteal DVTs.  Since the patient's admission she has had her JP drain replaced.  She also has a midline incision which has been having difficulty with healing.  The patient has a previous history of lymphedema.  She notes that she has been having some swelling in her legs and some discomfort she has not had any chest pain or shortness of breath.  Current Facility-Administered Medications  Medication Dose Route Frequency Provider Last Rate Last Admin   acetaminophen (TYLENOL) tablet 650 mg  650 mg Oral Q6H PRN Cox, Amy N, DO       Or   acetaminophen (TYLENOL) suppository 650 mg  650 mg Rectal Q6H PRN Cox, Amy N, DO       ascorbic acid (VITAMIN C) tablet 500 mg  500 mg Oral BID Cox, Amy N, DO   500 mg at 03/16/22 2034   cholecalciferol (VITAMIN D3) 25 MCG (1000 UNIT) tablet 5,000 Units  5,000 Units Oral Daily Cox, Amy N, DO   5,000 Units at 03/16/22 0830   ciprofloxacin (CIPRO) IVPB 400 mg  400 mg Intravenous Q12H Cox, Amy N, DO   Stopped at 03/16/22 1158   cyanocobalamin (VITAMIN B12) tablet 1,000 mcg  1,000 mcg Oral Daily Cox, Amy N, DO   1,000 mcg at 03/16/22 0830   feeding supplement (ENSURE ENLIVE / ENSURE PLUS) liquid 237 mL  237 mL Oral TID BM Cox, Amy N, DO   237 mL at 03/16/22 2035   fentaNYL (SUBLIMAZE) injection 25 mcg  25 mcg Intravenous Q2H PRN Cox, Amy N, DO    25 mcg at 03/15/22 2126   heparin ADULT infusion 100 units/mL (25000 units/234mL)  1,250 Units/hr Intravenous Continuous Benita Gutter, RPH 12.5 mL/hr at 03/16/22 1840 1,250 Units/hr at 03/16/22 1840   leptospermum manuka honey (MEDIHONEY) paste 1 Application  1 Application Topical Daily Wyvonnia Dusky, MD   1 Application at 03/31/84 1053   loperamide (IMODIUM) capsule 2 mg  2 mg Oral TID Herbert Pun, MD   2 mg at 03/16/22 2034   LORazepam (ATIVAN) tablet 0.5 mg  0.5 mg Oral Q6H PRN Wyvonnia Dusky, MD       metroNIDAZOLE (FLAGYL) IVPB 500 mg  500 mg Intravenous BID Cox, Amy N, DO   Stopped at 03/16/22 1624   mometasone-formoterol (DULERA) 200-5 MCG/ACT inhaler 2 puff  2 puff Inhalation BID Cox, Amy N, DO   2 puff at 03/16/22 2035   montelukast (SINGULAIR) tablet 10 mg  10 mg Oral QHS Cox, Amy N, DO   10 mg at 03/16/22 2036   morphine (PF) 2 MG/ML injection 2 mg  2 mg Intravenous Q3H PRN Herbert Pun, MD       ondansetron (ZOFRAN) tablet 4 mg  4 mg Oral Q6H PRN Cox, Amy N, DO       Or   ondansetron (ZOFRAN) injection 4 mg  4 mg Intravenous Q6H PRN Cox, Amy N, DO       pantoprazole (PROTONIX) EC tablet 40 mg  40 mg Oral BID AC Cox, Amy N, DO   40 mg at 03/16/22 1648   senna-docusate (Senokot-S) tablet 1 tablet  1 tablet Oral QHS PRN Cox, Amy N, DO       sodium chloride flush (NS) 0.9 % injection 10-40 mL  10-40 mL Intracatheter Q12H Wyvonnia Dusky, MD   10 mL at 03/16/22 2036   sodium chloride flush (NS) 0.9 % injection 10-40 mL  10-40 mL Intracatheter PRN Wyvonnia Dusky, MD       sodium chloride flush (NS) 0.9 % injection 5 mL  5 mL Intracatheter Q8H Corrie Mckusick, DO   5 mL at 03/16/22 2034   traMADol (ULTRAM) tablet 50 mg  50 mg Oral Q6H PRN Cox, Amy N, DO   50 mg at 03/16/22 0449    Past Medical History:  Diagnosis Date   Anxiety    Arthritis    Asthma    DVT (deep venous thrombosis) (HCC)    Edema    feet   Emphysema of lung (HCC)    GERD  (gastroesophageal reflux disease)    Headache    MIGRAINES   History of orthopnea    Hypercholesterolemia    Hypothyroidism    NODULES   Lymphedema    Migraine    Non-small cell lung cancer, right ([place]) 05/2019   Rad tx's   Shortness of breath dyspnea    WITH EXERTION   Sleep apnea    MILD, DOES NOT USE CPAP   Thyroid nodule     Past Surgical History:  Procedure Laterality Date   ABDOMINAL HYSTERECTOMY     BREAST BIOPSY     CARDIAC CATHETERIZATION     CATARACT EXTRACTION W/PHACO Left 02/12/2016   Procedure: CATARACT EXTRACTION PHACO AND INTRAOCULAR LENS PLACEMENT ([place]);  Surgeon: Eulogio Bear, MD;  Location: [facility] ORS;  Service: Ophthalmology;  Laterality: Left;  [place] 01:30 AP% 15.2 CDE 13.71 Fluid pack lot # [id number]   CATARACT EXTRACTION W/PHACO Right 03/18/2016   Procedure: CATARACT EXTRACTION PHACO AND INTRAOCULAR LENS PLACEMENT ([facility]);  Surgeon: Eulogio Bear, MD;  Location: [facility];  Service: Ophthalmology;  Laterality: Right;  Lot # [id number] [place]: 01:05.5 AP%:14.3 CDE: 9.33   COLECTOMY WITH COLOSTOMY CREATION/HARTMANN PROCEDURE N/A 01/20/2022   Procedure: COLECTOMY WITH COLOSTOMY CREATION/HARTMANN PROCEDURE;  Surgeon: Herbert Pun, MD;  Location: [facility] ORS;  Service: General;  Laterality: N/A;   FLEXIBLE SIGMOIDOSCOPY N/A 01/14/2022   Procedure: FLEXIBLE SIGMOIDOSCOPY;  Surgeon: Toledo, Benay Pike, MD;  Location: [facility];  Service: Gastroenterology;  Laterality: N/A;   FLEXIBLE SIGMOIDOSCOPY N/A 01/19/2022   Procedure: FLEXIBLE SIGMOIDOSCOPY;  Surgeon: Lesly Rubenstein, MD;  Location: [facility] ENDOSCOPY;  Service: Endoscopy;  Laterality: N/A;   FRACTURE SURGERY Left    arm rod and screw   IR RADIOLOGIST EVAL & MGMT  03/04/2022   KNEE ARTHROSCOPY     LAPAROTOMY N/A 01/24/2022   Procedure: EXPLORATORY LAPAROTOMY;  Surgeon: Herbert Pun, MD;  Location: [facility] ORS;  Service: General;  Laterality: N/A;   OOPHORECTOMY     RCR     ROTATOR CUFF REPAIR  Left 2006    Social History Social History   Tobacco Use   Smoking status: Never   Smokeless tobacco: Never  Substance Use Topics   Alcohol use: No   Drug use: Never    Family History Family History  Problem Relation Age of Onset   Emphysema Mother    Heart Problems Mother    Tuberculosis Father    Brain cancer Father    Skin cancer Father    Breast cancer Sister    Irritable bowel syndrome Sister    Lung cancer Brother    Dementia Sister    Schizophrenia Sister     Allergies  Allergen Reactions   Eryc [Erythromycin]    Levofloxacin Nausea Only   Percocet [Oxycodone-Acetaminophen] Nausea And Vomiting   Augmentin [Amoxicillin-Pot Clavulanate] Rash   Celecoxib Palpitations     REVIEW OF SYSTEMS (Negative unless checked)  Constitutional: [] Weight loss  [] Fever  [] Chills Cardiac: [] Chest pain   [] Chest pressure   [] Palpitations   [] Shortness of breath when laying flat   [] Shortness of breath at rest   [] Shortness of breath with exertion. Vascular:  [] Pain in legs with walking   [] Pain in legs at rest   [] Pain in legs when laying flat   [] Claudication   [] Pain in feet when walking  [] Pain in feet at rest  [] Pain in feet when laying flat   [] History of DVT   [] Phlebitis   [x] Swelling in legs   [] Varicose veins   [] Non-healing ulcers Pulmonary:   [] Uses home oxygen   [] Productive cough   [] Hemoptysis   [] Wheeze  [] COPD   [] Asthma Neurologic:  [] Dizziness  [] Blackouts   [] Seizures   [] History of stroke   [] History of TIA  [] Aphasia   [] Temporary blindness   [] Dysphagia   [] Weakness or numbness in arms   [] Weakness or numbness in legs Musculoskeletal:  [] Arthritis   [] Joint swelling   [] Joint pain   [] Low back pain Hematologic:  [] Easy bruising  [] Easy bleeding   [] Hypercoagulable state   [] Anemic  [] Hepatitis Gastrointestinal:  [] Blood in stool   [] Vomiting blood  [] Gastroesophageal reflux/heartburn   [] Difficulty swallowing. Genitourinary:  [] Chronic kidney disease    [] Difficult urination  [] Frequent urination  [] Burning with urination   [] Blood in urine Skin:  [] Rashes   [] Ulcers   [] Wounds Psychological:  [] History of anxiety   []  History of major depression.  Physical Examination  Vitals:   03/16/22 0547 03/16/22 0829 03/16/22 1641 03/16/22 2043  BP: (!) 104/52 (!) 104/59 (!) 97/58 (!) 111/53  Pulse: 99 99 100 (!) 101  Resp: 17 17 16 15   Temp: 98.3 F (36.8 C) 97.9 F (36.6 C) 98.1 F (36.7 C) 98.2 F (36.8 C)  TempSrc: Oral Oral Oral Oral  SpO2: 96% 99% 99% 99%  Weight:      Height:       Body mass index is 34.97 kg/m. Gen:  WD/WN, NAD Head: [place]/AT, No temporalis wasting. Prominent temp pulse not noted. Ear/Nose/Throat: Hearing grossly intact, nares w/o erythema or drainage, oropharynx w/o Erythema/Exudate Eyes: Sclera non-icteric, conjunctiva clear Neck: Trachea midline.  No JVD.  Pulmonary:  Good air movement, respirations not labored, equal bilaterally.  Cardiac: RRR, normal S1, S2. Vascular: 1+ edema bilaterally Vessel Right Left  Radial Palpable Palpable  PT Palpable Palpable  DP Palpable Palpable   Gastrointestinal: Left lower quadrant JP drain.  Midline incision with some nonhealed areas.  Ileostomy. Musculoskeletal: M/S 5/5 throughout.  Extremities without ischemic changes.  No deformity or atrophy.  Neurologic: Sensation grossly intact in extremities.  Symmetrical.  Speech is fluent. Motor exam as listed above. Psychiatric: Judgment intact, Mood & affect appropriate for pt's clinical situation. Dermatologic: No rashes or ulcers noted.  No cellulitis or open wounds. Lymph :  No Cervical, Axillary, or Inguinal lymphadenopathy.    CBC Lab Results  Component Value Date   WBC 11.0 (H) 03/16/2022   HGB 7.9 (L) 03/16/2022   HCT 23.9 (L) 03/16/2022   MCV 84.8 03/16/2022   PLT 333 03/16/2022    BMET    Component Value Date/Time   NA 130 (L) 03/16/2022 0521   NA 136 10/16/2012 1600   K 3.5 03/16/2022 0521   K 3.6  10/16/2012 1600   CL 105 03/16/2022 0521   CL 107 10/16/2012 1600   CO2 19 (L) 03/16/2022 0521   CO2 23 10/16/2012 1600   GLUCOSE 108 (H) 03/16/2022 0521   GLUCOSE 111 (H) 10/16/2012 1600   BUN <5 (L) 03/16/2022 0521   BUN 14 10/16/2012 1600   CREATININE 0.41 (L) 03/16/2022 0521   CREATININE 0.85 10/16/2012 1600   CALCIUM 8.4 (L) 03/16/2022 0521   CALCIUM 8.6 10/16/2012 1600   GFRNONAA >60 03/16/2022 0521   GFRNONAA >60 10/16/2012 1600   GFRAA >60 07/25/2019 1020   GFRAA >60 10/16/2012 1600   Estimated Creatinine Clearance: 44.2 mL/min (A) (by C-G formula based on SCr of 0.41 mg/dL (L)).  COAG Lab Results  Component Value Date   INR 1.7 (H) 03/13/2022   INR 1.2 02/18/2022   INR 1.3 (H) 02/17/2022    Radiology CT GUIDED PERITONEAL/RETROPERITONEAL FLUID DRAIN BY PERC CATH  Result Date: 03/15/2022 INDICATION: 81 year old female with multifocal fluid in collections, status post surgery, referred for drainage EXAM: CT-GUIDED ASPIRATION RIGHT LOWER QUADRANT FLUID CT-GUIDED DRAINAGE LEFT LOWER QUADRANT FLUID MEDICATIONS: The patient is currently admitted to the hospital and receiving intravenous antibiotics. The antibiotics were administered within an appropriate time frame prior to the initiation of the procedure. ANESTHESIA/SEDATION: Moderate (conscious) sedation was employed during this procedure. A total of Versed 1.0 mg and Fentanyl 50 mcg was administered intravenously by the radiology nurse. Total intra-service moderate Sedation Time: 39 minutes. The patient's level of consciousness and vital signs were monitored continuously by radiology nursing throughout the procedure under my direct supervision. COMPLICATIONS: None PROCEDURE: Informed written consent was obtained from the patient after a thorough discussion of the procedural risks, benefits and alternatives. All questions were addressed. Maximal Sterile Barrier Technique was utilized including caps, mask, sterile gowns, sterile  gloves, sterile drape, hand hygiene and skin antiseptic. A timeout was performed prior to the initiation of the procedure. Patient was positioned supine position on the CT gantry table. Scout CT acquired for planning purposes. We first addressed the right lower quadrant fluid. The patient was then prepped and draped in the usual sterile fashion. 1% lidocaine was used for local anesthesia. Using CT guidance, a trocar needle was advanced into the small fluid and gas collection of the right lower quadrant, lateral to the psoas muscle. Once we confirmed needle tip position, we aspirated approximately 5 cc of purulent fluid. A drain was not placed given the small size of this fluid collection. Sterile dressing was placed.  Sample was sent for culture. We then address the left lower quadrant fluid. The patient was repositioned into left anterior oblique position. Scout CT acquired for planning purposes. The patient was then prepped and draped in the usual sterile fashion. 1% lidocaine was used for local anesthesia. Using CT guidance, trocar needle was advanced into the fluid collection of the left lower quadrant. Using modified Seldinger technique, a 10 French drain was placed into the collection. Approximately 20 cc of frankly purulent material was aspirated. Sample sent for culture. The drain  was sutured in position and attached to bulb drainage. Final CT was acquired. Patient tolerated the procedure well and remained hemodynamically stable throughout. No complications were encountered and no significant blood loss. IMPRESSION: Status post CT-guided aspiration of right lower quadrant fluid and drainage of left lower quadrant fluid. Signed, Dulcy Fanny. Nadene Rubins, RPVI [facility] Radiology Electronically Signed   By: Corrie Mckusick D.O.   On: 03/15/2022 14:57   CT ASPIRATION N/S  Result Date: 03/15/2022 INDICATION: 81 year old female with multifocal fluid in  collections, status post surgery, referred for drainage EXAM: CT-GUIDED ASPIRATION RIGHT LOWER QUADRANT FLUID CT-GUIDED DRAINAGE LEFT LOWER QUADRANT FLUID MEDICATIONS: The patient is currently admitted to the hospital and receiving intravenous antibiotics. The antibiotics were administered within an appropriate time frame prior to the initiation of the procedure. ANESTHESIA/SEDATION: Moderate (conscious) sedation was employed during this procedure. A total of Versed 1.0 mg and Fentanyl 50 mcg was administered intravenously by the radiology nurse. Total intra-service moderate Sedation Time: 39 minutes. The patient's level of consciousness and vital signs were monitored continuously by radiology nursing throughout the procedure under my direct supervision. COMPLICATIONS: None PROCEDURE: Informed written consent was obtained from the patient after a thorough discussion of the procedural risks, benefits and alternatives. All questions were addressed. Maximal Sterile Barrier Technique was utilized including caps, mask, sterile gowns, sterile gloves, sterile drape, hand hygiene and skin antiseptic. A timeout was performed prior to the initiation of the procedure. Patient was positioned supine position on the CT gantry table. Scout CT acquired for planning purposes. We first addressed the right lower quadrant fluid. The patient was then prepped and draped in the usual sterile fashion. 1% lidocaine was used for local anesthesia. Using CT guidance, a trocar needle was advanced into the small fluid and gas collection of the right lower quadrant, lateral to the psoas muscle. Once we confirmed needle tip position, we aspirated approximately 5 cc of purulent fluid. A drain was not placed given the small size of this fluid collection. Sterile dressing was placed.  Sample was sent for culture. We then address the left lower quadrant fluid. The patient was repositioned into left anterior oblique position. Scout CT acquired for  planning purposes. The patient was then prepped and draped in the usual sterile fashion. 1% lidocaine was used for local anesthesia. Using CT guidance, trocar needle was advanced into the fluid collection of the left lower quadrant. Using modified Seldinger technique, a 10 French drain was placed into the collection. Approximately 20 cc of frankly purulent material was aspirated. Sample sent for culture. The drain was sutured in position and attached to bulb drainage. Final CT was acquired. Patient tolerated the procedure well and remained hemodynamically stable throughout. No complications were encountered and no significant blood loss. IMPRESSION: Status post CT-guided aspiration of right lower quadrant fluid and drainage of left lower quadrant fluid. Signed, Dulcy Fanny. Nadene Rubins, RPVI [facility] Radiology Electronically Signed   By: Corrie Mckusick D.O.   On: 03/15/2022 14:57   DG Chest 2 View  Result Date: 03/13/2022 CLINICAL DATA:  Suspected sepsis EXAM: CHEST - 2 VIEW COMPARISON:  Chest x-ray February 17, 2022 FINDINGS: Stable cardiomediastinal contours. Unchanged calcified atherosclerosis of the aortic arch. Both lungs are clear. The visualized skeletal structures are unremarkable. IMPRESSION: No acute cardiopulmonary abnormality. Electronically Signed   By: Beryle Flock M.D.   On: 03/13/2022 10:56   CT ABDOMEN PELVIS W CONTRAST  Result Date:  03/11/2022 CLINICAL DATA:  Abdominal pain with persistent leakage from ileostomy site status post partial colectomy and ileostomy 01/20/2022. History of lung cancer. EXAM: CT ABDOMEN AND PELVIS WITH CONTRAST TECHNIQUE: Multidetector CT imaging of the abdomen and pelvis was performed using the standard protocol following bolus administration of intravenous contrast. RADIATION DOSE REDUCTION: This exam was performed according to the departmental dose-optimization program which includes automated exposure  control, adjustment of the mA and/or kV according to patient size and/or use of iterative reconstruction technique. CONTRAST:  124mL OMNIPAQUE IOHEXOL 300 MG/ML  SOLN COMPARISON:  Abdominopelvic CT 03/04/2022 and 02/17/2022. FINDINGS: Lower chest: Stable appearance of the lung bases, without acute findings. No significant pleural effusion. Hepatobiliary: The liver is normal in density without suspicious focal abnormality. No evidence of gallstones, gallbladder wall thickening or biliary dilatation. Pancreas: Unremarkable. No pancreatic ductal dilatation or surrounding inflammatory changes. Spleen: Normal in size without focal abnormality. Adrenals/Urinary Tract: Both adrenal glands appear normal. No evidence of urinary tract calculus, suspicious renal lesion or hydronephrosis. The bladder appears unremarkable for its degree of distention. Stomach/Bowel: Enteric contrast was administered and has passed through the right lower quadrant ileostomy. The stomach appears unremarkable for its degree of distention. No small bowel wall thickening or surrounding inflammation. Status post subtotal colectomy with stable appearance of the [facility] pouch. Vascular/Lymphatic: Extensive nonocclusive bilateral femoral vein deep venous thrombosis again noted, similar to the most recent study. No significant iliac vein extension. The IVC is patent. Aortic and branch vessel atherosclerosis without evidence of aneurysm. There are no enlarged abdominopelvic lymph nodes. Reproductive: Hysterectomy.  No adnexal mass. Other: Bilateral percutaneous pelvic drains have been removed in the interval. There is a small recurrent air-fluid collection on the right anterior to the iliacus muscle, measuring 2.8 x 2.9 cm transverse on image 57/2 and up to 5.5 cm on sagittal image 48/6. There is no contrast material within this collection. There is a larger left-sided air-fluid collection which measures up to 5.3 x 2.2 cm on axial image 62/2 and extends  at least 7.9 cm on coronal image 50/5, extending inferiorly to the vaginal cuff. There is no contrast material within this collection. A small amount of gas remains within the midline abdominal incision without definite enteric contrast. Superficial enteric contrast at the umbilicus appears spilled from the ileostomy. No free air. No ascites. Musculoskeletal: No acute or significant osseous findings. Multilevel thoracolumbar spondylosis. Degenerative changes of both hips. Diffuse pelvic muscular atrophy. IMPRESSION: 1. Interval removal of bilateral percutaneous pelvic drains. There are small recurrent air-fluid collections in both sides of the pelvis which do not contain contrast material, suspicious for small abscesses. No free air or ascites. 2. No evidence of bowel obstruction or ileus. 3. 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 4. 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 5.  Aortic Atherosclerosis (ICD10-I70.0). 6. These results will be called to the ordering clinician or representative by the Radiologist Assistant, and communication documented in the PACS or [employer]. Electronically Signed   By: Richardean Sale M.D.   On: 03/11/2022 14:33   US Venous Img Lower Bilateral (DVT)  Result Date: 03/04/2022 CLINICAL DATA:  81 year old female with common femoral DVT identified on CTA earlier today. EXAM: BILATERAL LOWER EXTREMITY VENOUS DOPPLER ULTRASOUND TECHNIQUE: Gray-scale sonography with compression, as well as color and duplex ultrasound, were performed to evaluate the deep venous system(s) from the level of the common femoral vein through the popliteal and proximal calf veins. COMPARISON:  None Available. FINDINGS:  VENOUS Nonocclusive thrombus within the RIGHT common femoral vein noted as well as occlusive thrombus within the RIGHT femoral, popliteal and calf veins. Nonocclusive thrombus within the common femoral, femoral and posterior tibial  veins are noted. OTHER None. Limitations: none IMPRESSION: Bilateral LOWER extremity DVT involving bilateral common femoral, femoral, RIGHT popliteal and calf veins. Occlusive thrombus is noted within the RIGHT femoral, popliteal and calf veins. Clinical team is aware of LOWER extremity DVT from CT earlier today. This report will be called to the referring clinician by sonography staff. Electronically Signed   By: Margarette Canada M.D.   On: 03/04/2022 17:24   IR Radiologist Eval & Mgmt  Result Date: 03/04/2022 EXAM: ESTABLISHED PATIENT OFFICE VISIT CHIEF COMPLAINT: See epic note. HISTORY OF PRESENT ILLNESS: See epic note. REVIEW OF SYSTEMS: See epic note. PHYSICAL EXAMINATION: See epic note. ASSESSMENT AND PLAN: See epic note. Ruthann Cancer, MD [facility]iology Special[facility] Radiology Electronically Signed   By: Ruthann Cancer M.D.   On: 03/04/2022 09:57   DG Sinus/Fist Tube Chk-Non GI  Result Date: 03/04/2022 CLINICAL DATA:  81 year old female with history of subtotal colectomy and end ileostomy complicated by intra-abdominal fluid collection status post percutaneous drain placement on 02/18/2022. The patient presents to drain clinic with trace output from the drains and CT evidence of resolution of previously visualized fluid collections. EXAM: ABSCESS INJECTION COMPARISON:  None Available. CONTRAST:  10 mL Omnipaque 300-administered via the existing percutaneous drain. FLUOROSCOPY TIME:  1.5 mGy TECHNIQUE: The patient was positioned supine on the fluoroscopy table. A preprocedural spot fluoroscopic image was obtained of the pelvis and the existing percutaneous drainage catheters. Multiple spot fluoroscopic and radiographic images were obtained following the injection of a small amount of contrast via the existing percutaneous drainage catheters. The external portion of the drains were cut to release inter pigtails. The retention sutures were removed. The drains were removed  successfully without complication. FINDINGS: Resolution of previously visualized fluid collections. No evidence of enteric fistula. The drains were removed successfully. IMPRESSION: Resolution of previously visualized fluid collections without evidence of enteric fistula. The drains were removed successfully. Ruthann Cancer, MD [facility] Special[facility] Radiology Electronically Signed   By: Ruthann Cancer M.D.   On: 03/04/2022 09:25   CT ABDOMEN PELVIS W CONTRAST  Result Date: 03/04/2022 CLINICAL DATA:  81 year old female with history of lower abdominal abscesses status post percutaneous drain placement on 02/18/2022. The patient presents with minimal output from both drains. EXAM: CT ABDOMEN AND PELVIS WITH CONTRAST TECHNIQUE: Multidetector CT imaging of the abdomen and pelvis was performed using the standard protocol following bolus administration of intravenous contrast. RADIATION DOSE REDUCTION: This exam was performed according to the departmental dose-optimization program which includes automated exposure control, adjustment of the mA and/or kV according to patient size and/or use of iterative reconstruction technique. CONTRAST:  140mL ISOVUE-300 IOPAMIDOL (ISOVUE-300) INJECTION 61% COMPARISON:  02/17/2022, 02/18/2022 FINDINGS: Lower chest: No acute abnormality. Hepatobiliary: No focal liver abnormality is seen. No gallstones, gallbladder wall thickening, or biliary dilatation. Pancreas: Unremarkable. No pancreatic ductal dilatation or surrounding inflammatory changes. Spleen: Normal in size without focal abnormality. Adrenals/Urinary Tract: Adrenal glands are unremarkable. Kidneys are normal, without renal calculi, focal lesion, or hydronephrosis. Bladder is unremarkable. Stomach/Bowel: Stomach is within normal limits. Similar appearing postsurgical changes after partial colectomy. 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 No evidence of  bowel wall thickening, distention, or inflammatory changes. Vascular/Lymphatic: Nonocclusive deep vein thrombosis extending to the central  aspect of the common femoral veins bilaterally. No evidence of significant iliac extension or evidence of ileo caval thrombosis. Similar-appearing atherosclerotic calcifications are noted peer no abdominopelvic lymphadenopathy. Reproductive: Status post hysterectomy. No adnexal masses. Other: Significant interval healing of previously visualized incisional hernia and left lower quadrant laparoscopy port site. Musculoskeletal: Unchanged multilevel degenerative changes of the visualized thoracolumbar spine. No acute osseous abnormality. IMPRESSION: 1. Nonocclusive bilateral femoral deep vein thrombosis. No evidence of iliocaval extension. Recommend bilateral lower extremity venous ultrasound for further characterization. 2. Interval resolution of previously visualized bilateral lower abdominal fluid collections with unchanged position of the indwelling drains. 3. Postsurgical changes after subtotal colectomy and right lower quadrant in ileostomy without complicating features. These results will be called to the ordering clinician or representative by the Radiologist Assistant, and communication documented in the PACS or [employer]. Ruthann Cancer, MD [facility] Radiology Electronically Signed   By: Ruthann Cancer M.D.   On: 03/04/2022 09:08   CT GUIDED PERITONEAL/RETROPERITONEAL FLUID DRAIN BY PERC CATH  Result Date: 02/18/2022 INDICATION: 81 year old with recent abdominal surgeries including subtotal colectomy with an end ileostomy. Recent CT demonstrates intra-abdominal abscesses. Plan for CT-guided drainage of the intra-abdominal abscesses. EXAM: CT-guided drain placement in right lower abdominal/pelvic abscess CT-guided drain placement in left lower abdominal/pelvic abscess. MEDICATIONS: Moderate sedation  ANESTHESIA/SEDATION: Moderate (conscious) sedation was employed during this procedure. A total of Versed 1.0mg  and fentanyl 75 mcg was administered intravenously at the order of the provider performing the procedure. Total intra-service moderate sedation time: 32 minutes. Patient's level of consciousness and vital signs were monitored continuously by radiology nurse throughout the procedure under the supervision of the provider performing the procedure. COMPLICATIONS: None immediate. PROCEDURE: Informed written consent was obtained from the patient after a thorough discussion of the procedural risks, benefits and alternatives. All questions were addressed. Maximal Sterile Barrier Technique was utilized including caps, mask, sterile gowns, sterile gloves, sterile drape, hand hygiene and skin antiseptic. A timeout was performed prior to the initiation of the procedure. Patient was placed supine on the CT scanner. Images through the lower abdomen and pelvis were obtained. Abscess collections situated between the right psoas and right iliacus muscle targeted. The right side of the lower abdomen was prepped with chlorhexidine and sterile field was created. Skin was anesthetized with 1% lidocaine. A small incision was made. Using CT guidance, an 18 gauge trocar needle was directed into the collection adjacent to the right psoas muscle. Superstiff Amplatz wire was placed but the wire went into smaller collection anterior to the larger abscess collection. As a result, this wire and needle were removed. A new incision was made in order to target the larger abscess collection. Using CT guidance, the 18 gauge needle was directed into the abscess and yellow purulent fluid was aspirated. Superstiff Amplatz wire was placed. Tract was dilated to accommodate a 10 [place] multipurpose drain and approximately 20 mL of purulent fluid was removed. Drain was attached to a suction bulb and sutured to skin. Attention was directed to the  abscess in the left lower abdomen. Left lower abdomen was prepped with chlorhexidine and sterile field was created. Skin was anesthetized using 1% lidocaine. Small incision was made. Using CT guidance, an 18 gauge trocar needle was directed into the air-fluid collection and purulent fluid was aspirated. Superstiff Amplatz wire was placed and the tract was dilated to accommodate a 10 [place] multipurpose drain. 20 mL of thick yellow purulent fluid was removed. Follow up CT images  were obtained. Drain was attached to a suction bulb and sutured to skin. Fluid samples were sent for culture. RADIATION DOSE REDUCTION: This exam was performed according to the departmental dose-optimization program which includes automated exposure control, adjustment of the mA and/or kV according to patient size and/or use of iterative reconstruction technique. FINDINGS: Multiple air-fluid collections in the right lower abdomen. Largest abscess was situated between the right psoas muscle and right iliacus muscle. Purulent fluid was removed from the largest collection. Drain was placed in the largest abscess collection. Drain was successfully placed in the air-fluid collection in the left lower abdomen adjacent to the left psoas muscle. 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 IMPRESSION: Successful placement of a CT-guided drain in the left lower abdominal abscess and CT-guided placement of a drain in the right lower abdominal abscess. Electronically Signed   By: Markus Daft M.D.   On: 02/18/2022 21:23   CT GUIDED PERITONEAL/RETROPERITONEAL FLUID DRAIN BY PERC CATH  Result Date: 02/18/2022 INDICATION: 81 year old with recent abdominal surgeries including subtotal colectomy with an end ileostomy. Recent CT demonstrates intra-abdominal abscesses. Plan for  CT-guided drainage of the intra-abdominal abscesses. EXAM: CT-guided drain placement in right lower abdominal/pelvic abscess CT-guided drain placement in left lower abdominal/pelvic abscess. MEDICATIONS: Moderate sedation ANESTHESIA/SEDATION: Moderate (conscious) sedation was employed during this procedure. A total of Versed 1.0mg  and fentanyl 75 mcg was administered intravenously at the order of the provider performing the procedure. Total intra-service moderate sedation time: 32 minutes. Patient's level of consciousness and vital signs were monitored continuously by radiology nurse throughout the procedure under the supervision of the provider performing the procedure. COMPLICATIONS: None immediate. PROCEDURE: Informed written consent was obtained from the patient after a thorough discussion of the procedural risks, benefits and alternatives. All questions were addressed. Maximal Sterile Barrier Technique was utilized including caps, mask, sterile gowns, sterile gloves, sterile drape, hand hygiene and skin antiseptic. A timeout was performed prior to the initiation of the procedure. Patient was placed supine on the CT scanner. Images through the lower abdomen and pelvis were obtained. Abscess collections situated between the right psoas and right iliacus muscle targeted. The right side of the lower abdomen was prepped with chlorhexidine and sterile field was created. Skin was anesthetized with 1% lidocaine. A small incision was made. Using CT guidance, an 18 gauge trocar needle was directed into the collection adjacent to the right psoas muscle. Superstiff Amplatz wire was placed but the wire went into smaller collection anterior to the larger abscess collection. As a result, this wire and needle were removed. A new incision was made in order to target the larger abscess collection. Using CT guidance, the 18 gauge needle was directed into the abscess and yellow purulent fluid was aspirated. Superstiff Amplatz wire  was placed. Tract was dilated to accommodate a 10 [place] multipurpose drain and approximately 20 mL of purulent fluid was removed. Drain was attached to a suction bulb and sutured to skin. Attention was directed to the abscess in the left lower abdomen. Left lower abdomen was prepped with chlorhexidine and sterile field was created. Skin was anesthetized using 1% lidocaine. Small incision was made. Using CT guidance, an 18 gauge trocar needle was directed into the air-fluid collection and purulent fluid was aspirated. Superstiff Amplatz wire was placed and the tract was dilated to accommodate a 10 [place]  multipurpose drain. 20 mL of thick yellow purulent fluid was removed. Follow up CT images were obtained. Drain was attached to a suction bulb and sutured to skin. Fluid samples were sent for culture. RADIATION DOSE REDUCTION: This exam was performed according to the departmental dose-optimization program which includes automated exposure control, adjustment of the mA and/or kV according to patient size and/or use of iterative reconstruction technique. FINDINGS: Multiple air-fluid collections in the right lower abdomen. Largest abscess was situated between the right psoas muscle and right iliacus muscle. Purulent fluid was removed from the largest collection. Drain was placed in the largest abscess collection. Drain was successfully placed in the air-fluid collection in the left lower abdomen adjacent to the left psoas muscle. 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 IMPRESSION: Successful placement of a CT-guided drain in the left lower abdominal abscess and CT-guided placement of a drain in the right lower abdominal abscess. Electronically Signed   By: Markus Daft M.D.   On: 02/18/2022 21:23   CT Abdomen Pelvis W Contrast  Result  Date: 02/17/2022 CLINICAL DATA:  Sigmoid hemicolectomy 01/20/2022. Repair of cecal perforation. Loop ileostomy creation. Drains removed last week. Abdominal pain. Elevated white blood cell count. EXAM: CT ABDOMEN AND PELVIS WITH CONTRAST TECHNIQUE: Multidetector CT imaging of the abdomen and pelvis was performed using the standard protocol following bolus administration of intravenous contrast. RADIATION DOSE REDUCTION: This exam was performed according to the departmental dose-optimization program which includes automated exposure control, adjustment of the mA and/or kV according to patient size and/or use of iterative reconstruction technique. CONTRAST:  134mL OMNIPAQUE IOHEXOL 300 MG/ML  SOLN COMPARISON:  02/01/2022 FINDINGS: Lower chest: Motion degradation within the lower chest. Tiny bibasilar pulmonary nodules are most likely incidental/benign and do not warrant imaging follow-up per consensus criteria. Example at maximally 3 mm.Mild cardiomegaly, without pericardial or pleural effusion. Hepatobiliary: Mild motion degradation continuing into the upper abdomen. Normal liver. The gallbladder is underdistended, without calcified stone or specific evidence of acute cholecystitis. Pancreas: Normal, without mass or ductal dilatation. Spleen: Normal in size, without focal abnormality. Adrenals/Urinary Tract: Normal adrenal glands. Normal kidneys, without hydronephrosis. Normal urinary bladder. Stomach/Bowel: Normal stomach, without wall thickening. Interval Hartmann's pouch creation and new complete colectomy. Right lower quadrant ileostomy again identified. Ill-defined right pelvic fluid and gas of 6.0 x 5.1 cm on 57/2 is contiguous with the rectal stump, new since 01/24/2022. ill-defined left pelvic fluid and gas collection measures 6.7 x 4.3 cm on 57/2, also likely tracking from the rectal stump including on 63/2. Extends to the left adnexa and superior aspect of the left side of the vaginal cuff including on  66/2. More ill-defined gas within the ileocolic mesentery including on 50/2. Vascular/Lymphatic: Aortic atherosclerosis. 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 No abdominopelvic adenopathy. Reproductive: Hysterectomy.  No adnexal mass. Other: No free pelvic fluid. Pelvic midline laparotomy included on 66/2. Hyperattenuation material within could represent contrast. Gas just deep to the laparotomy site on 63/2 is intimately associated with the adjacent bowel loops. No well-defined fluid collection. There is also midline abdominal wall presumably laparotomy induced gas on 49/2. Musculoskeletal: No acute osseous abnormality. Grade 1 L3-4 anterolisthesis with advanced lumbosacral spondylosis. IMPRESSION: 1. Minimally motion degraded exam. 2. Interval near complete colectomy with rectal stump creation. Bilateral ill-defined gas and fluid collections within the pelvis, most consistent  with abscesses. These track from the rectal stump site, and anastomotic leak cannot be excluded. 3. Abdominopelvic laparotomy sites. Gas deep to pelvic surgical sutures is intimately associated with adjacent bowel and fistulous communication cannot be excluded (especially given hyperattenuating material within the defect, possibly contrast). 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 Electronically Signed   By: Abigail Miyamoto M.D.   On: 02/17/2022 18:08   DG Chest 2 View  Result Date: 02/17/2022 CLINICAL DATA:  Suspected sepsis EXAM: CHEST - 2 VIEW COMPARISON:  11/29/2020 FINDINGS: Linear atelectasis in the right upper lobe. No confluent airspace opacities otherwise. Heart is normal size. Mediastinal contours within normal limits. Aortic atherosclerosis. No effusions or acute bony abnormality. IMPRESSION: Platelike right upper lobe atelectasis. No active disease. Electronically Signed   By: Rolm Baptise M.D.   On: 02/17/2022 17:09      Assessment/Plan 1.  Bilateral deep vein thrombosis  Long discussion with the patient and her husband in  regards to intervention and thrombectomy of bilateral DVT.  The patient has bilateral femoral-popliteal DVTs.  This intervention is typically done with the patient in a prone position.  The patient has a large midline incision that has had difficulty with healing in addition to still having pain and discomfort from recent drain placement.  The patient also has had a recent ileostomy.  Based on this placing the patient in a prone position for extended.  To undergo thrombectomy would be difficult and painful for the patient.  There is also concern that it may compromise her wound healing.  There is also concern that while we know that the DVT is less than a month old it is at least nearly 74 weeks old.  If the thrombus is indeed older than 2 weeks, thrombectomy sometimes she will use less than optimal results due to thrombus beginning to become chronic more organized in nature.  Based on all these factors, we felt it would be in the patient's best interest not to proceed with thrombectomy at this time and to continue with medical management.  The patient has remained on anticoagulation in the form of heparin drip and is noted that she is able to resume anticoagulation with Eliquis 5 mg twice daily, as indicated by Dr.Cintron Ferrel Logan today in his progress note from today.  Based on this it is not felt that the patient needs an IVC filter in place, however if she should ever require undergoing any procedure or her anticoagulation would be stopped within the next several months, she should have an IVC filter placed prior to any further planned intervention.  We will plan on having the patient return to the office in 3 weeks to evaluate progress of DVT.  Patient is advised that she is able to wear medical grade compression stockings upon discharge. 2.  Lymphedema  Prior to the bilateral DVTs the patient has had known bilateral lower extremity lymphedema.  She has lymphedema pump.  At this time the patient is instructed not  to utilize her lymphedema pump for the next few weeks due to her DVT.  She is advised however to continue with conservative therapy such as use of medical grade compression and elevation activity as tolerable for her.  Plan of care discussed with Dr.Schnier and he is in agreement with plan noted above.   Family Communication: Husband at bedside  Total Time:75 minutes I spent 75 minutes in this encounter including personally reviewing extensive medical records, personally reviewing imaging studies and compared to prior scans, counseling the  patient, placing orders, coordinating care and performing appropriate documentation  Thank you for allowing [place] to participate in the care of this patient.   Kris Hartmann, NP [facility] [phone] (Office Phone) [phone] (Office Fax) [phone] (Pager)  03/16/2022 11:08 PM  Staff may message me via secure chat in [place]  but this may not receive immediate response,  please page for urgent matters!  Dictation software was used to generate the above note. Typos may occur and escape review, as with typed/written notes. Any error is purely unintentional.  Please contact me directly for clarity if needed.

## 2022-03-16 NOTE — Progress Notes (Signed)
PROGRESS NOTE   HPI was taken from Dr. 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   Initial vitals in the emergency department showed temperature of 98.4, respiration rate of 20, heart rate of 91, blood pressure 106/54, SPO2 of 95% on room air.   Serum sodium is 130, potassium 3.9, chloride 103, bicarb 18, BUN of 19, serum creatinine 1.00, nonfasting blood glucose 89, GFR 56, WBC 8.5, hemoglobin 9.6, platelets of 311.   Lactic acid was 1.1.   ED treatment: Ciprofloxacin 400 mg IV, metronidazole 500 mg IV, sodium chloride 1 L bolus.   EDP discussed patient's case with Dr. Hampton Abbot who recommends admission for IV antibiotics and contacting interventional radiology.  EDP spoke with interventional radiology, Dr. Annamaria Boots who recommends hospitalist admission for IV antibiotics and they will determine when another JP drain can be placed. ------------------------------- At bedside, she is able to tell me her name, age, current year, current location.  She does not appear to be in acute distress.   Patient and daughter at bedside reports purulence at the left abdominal incision started on Sunday, 03/07/22.  She reports there is mixed of blood and purulence.  The suprapubic incision started leaking blood purulence today, prompting family present for further evaluation.  To prsent to the ED   She had fever of approximately 15 F yesterday that resolved with OTC acetaminophen.    She denies dysuria. She watery stool  in the colostomy bag for about 1 week.  She denies chest pain, shortness of breath, syncope, chest pain.  She endorses approximately 25 to 30 pound weight loss since her abdominal issues and multi hospitalization has  started.  She denies trauma to her person.   As per Dr. Jimmye Norman 10/8-10/10/23: Pt presented w/ recurrent pelvic abscesses and s/p drain in LLQ by IR 03/15/22. Abd abscess cx was taken and currently growing abundant gram positive cocci in chains but too young to read as per micro. Pt is on IV cipro & flagyl. Pt has hx of subtotal colectomy w/ end ileostomy.    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 Continue w/ ostomy care. Blood cxs NGTD. S/p drain placed in LLQ by IR 03/15/22. Pelvic abscess cx growing gram positive cocci in chains currently. Continue on IV cipro, flagyl. Gen surg following and recs apprec   Pressure injury of skin: present on admission. Continue w/ wound care    Hyponatremia: stable from day prior. Will continue to monitor    Chronic b/l LE DVTs: currently on IV heparin drip but may restart  eliquis as per gen surg today. Vasc surg was consulted by gen surg    Hypomagnesemia: mg sulfate ordered   Anxiety: severity unknown. Lorazepam prn   Normocytic anemia: H&H are labile. Will transfuse if Hb < 7.0    DVT prophylaxis: IV heparin   Code Status: full  Family Communication: Disposition Plan:  depends on PT/OT recs   Level of care: Telemetry Medical  Status is: Inpatient Remains  inpatient appropriate because: severity of illness      Consultants:  General surg IR  Procedures:   Antimicrobials: cipro, flagyl   Subjective: Pt c/o malaise   Objective: Vitals:   03/15/22 1415 03/15/22 1545 03/15/22 2022 03/16/22 0547  BP: 135/74 (!) 121/53 (!) 118/50 (!) 104/52  Pulse: 98 (!) 106 (!) 102 99  Resp: 18 16 18 17   Temp:  98.5 F (36.9 C) 98.4 F (36.9 C) 98.3 F (36.8 C)  TempSrc:  Oral Oral Oral  SpO2: 96% 97% 96% 96%  Weight:      Height:        Intake/Output Summary (Last 24 hours) at 03/16/2022 0820 Last data filed at 03/15/2022 2130 Gross per 24 hour  Intake 220.86 ml  Output 605 ml  Net -384.14 ml   Filed Weights   03/13/22 1027 03/14/22 0600  Weight: 72 kg 73.3 kg    Examination:  General exam: Appears calm & comfortable  Respiratory system: clear breath sounds b/l. No rales Cardiovascular system: S1 & S2+. No rubs or clicks  Gastrointestinal system: Abd is soft, NT, obese & normal bowel sounds. Ostomy bag present Central nervous system: Alert and oriented. Moves all extremities  Psychiatry: judgement and insight appears normal. Flat mood and affect    Data Reviewed: I have personally reviewed following labs and imaging studies  CBC: Recent Labs  Lab 03/13/22 1032 03/14/22 0514 03/15/22 0521 03/16/22 0521  WBC 8.5 8.7 10.5 11.0*  NEUTROABS 6.4  --   --   --   HGB 9.2* 8.5* 7.6* 7.9*  HCT 28.7* 26.7* 23.3* 23.9*  MCV 87.8 87.5 86.0 84.8  PLT 311 285 313 751   Basic Metabolic Panel: Recent Labs  Lab 03/13/22 1032 03/13/22 1540 03/14/22 0514 03/15/22 0521 03/16/22 0521  NA 130*  --  131* 130* 130*  K 3.9  --  4.3 4.0 3.5  CL 103  --  108 109 105  CO2 18*  --  17* 17* 19*  GLUCOSE 89  --  111* 117* 108*  BUN 19  --  14 8 <5*  CREATININE 1.00  --  0.67 0.61 0.41*  CALCIUM 8.4*  --  8.0* 8.4* 8.4*  MG  --  1.0* 1.6*  --  1.4*  PHOS  --  3.4  --   --   --    GFR: Estimated Creatinine Clearance: 44.2 mL/min  (A) (by C-G formula based on SCr of 0.41 mg/dL (L)). Liver Function Tests: Recent Labs  Lab 03/13/22 1032  AST 13*  ALT 8  ALKPHOS 88  BILITOT 0.4  PROT 6.7  ALBUMIN 2.9*   No results for input(s): "LIPASE", "AMYLASE" in the last 168 hours. No results for input(s): "AMMONIA" in the last 168 hours. Coagulation Profile: Recent Labs  Lab 03/13/22 1346  INR 1.7*   Cardiac Enzymes: No results for input(s): "CKTOTAL", "CKMB", "CKMBINDEX", "TROPONINI" in the last 168 hours. BNP (last 3 results) No results for input(s): "PROBNP" in the last 8760 hours. HbA1C: No results for input(s): "HGBA1C" in the  last 72 hours. CBG: No results for input(s): "GLUCAP" in the last 168 hours. Lipid Profile: No results for input(s): "CHOL", "HDL", "LDLCALC", "TRIG", "CHOLHDL", "LDLDIRECT" in the last 72 hours. Thyroid Function Tests: No results for input(s): "TSH", "T4TOTAL", "FREET4", "T3FREE", "THYROIDAB" in the last 72 hours. Anemia Panel: Recent Labs    03/13/22 1540  VITAMINB12 3,597*   Sepsis Labs: Recent Labs  Lab 03/13/22 1032 03/13/22 1540  LATICACIDVEN 1.4 1.0    Recent Results (from the past 240 hour(s))  Culture, blood (Routine x 2)     Status: None (Preliminary result)   Collection Time: 03/13/22  1:46 PM   Specimen: BLOOD  Result Value Ref Range Status   Specimen Description BLOOD LEFT ARM  Final   Special Requests   Final    BOTTLES DRAWN AEROBIC AND ANAEROBIC Blood Culture adequate volume   Culture   Final    NO GROWTH 2 DAYS Performed at [facility], [address] [place], [place] [address]    Report Status PENDING  Incomplete  Culture, blood (Routine x 2)     Status: None (Preliminary result)   Collection Time: 03/13/22  1:46 PM   Specimen: BLOOD  Result Value Ref Range Status   Specimen Description BLOOD RIGHT ARM  Final   Special Requests   Final    BOTTLES DRAWN AEROBIC AND ANAEROBIC Blood Culture adequate volume   Culture   Final    NO GROWTH  2 DAYS Performed at [facility], [address] [place], [place] [address]    Report Status PENDING  Incomplete  Aerobic/Anaerobic Culture w Gram Stain (surgical/deep wound)     Status: None (Preliminary result)   Collection Time: 03/15/22  1:33 PM   Specimen: Abscess  Result Value Ref Range Status   Specimen Description   Final    ABSCESS ABDOMEN Performed at [facility], [address], [place], [place] [address]    Special Requests   Final    NONE Performed at [facility], [address], [place], [place] [address]    Gram Stain   Final    ABUNDANT GRAM POSITIVE COCCI IN CHAINS MODERATE WBC PRESENT, PREDOMINANTLY PMN    Culture   Final    TOO YOUNG TO READ Performed at [facility], [place] [address] [place], [place] [address]    Report Status PENDING  Incomplete  Aerobic/Anaerobic Culture w Gram Stain (surgical/deep wound)     Status: None (Preliminary result)   Collection Time: 03/15/22  1:33 PM   Specimen: Abdomen; Abscess  Result Value Ref Range Status   Specimen Description   Final    ABDOMEN ABSCESS Performed at [facility], [address], [place], [place] [address]    Special Requests   Final    SYRINGE 2 Performed at [facility], [address] [place], [place] [address]    Gram Stain   Final    ABUNDANT WBC PRESENT, PREDOMINANTLY PMN ABUNDANT GRAM POSITIVE COCCI IN CHAINS    Culture   Final    TOO YOUNG TO READ Performed at [facility], [place] [address] [place], [place] [address]    Report Status PENDING  Incomplete         Radiology Studies: CT GUIDED PERITONEAL/RETROPERITONEAL FLUID DRAIN BY PERC CATH  Result Date: 03/15/2022 INDICATION: 81 year old female with multifocal fluid in collections, status post surgery, referred for drainage EXAM: CT-GUIDED ASPIRATION RIGHT LOWER QUADRANT FLUID CT-GUIDED DRAINAGE LEFT LOWER  QUADRANT FLUID MEDICATIONS: The patient is currently admitted to  the hospital and receiving intravenous antibiotics. The antibiotics were administered within an appropriate time frame prior to the initiation of the procedure. ANESTHESIA/SEDATION: Moderate (conscious) sedation was employed during this procedure. A total of Versed 1.0 mg and Fentanyl 50 mcg was administered intravenously by the radiology nurse. Total intra-service moderate Sedation Time: 39 minutes. The patient's level of consciousness and vital signs were monitored continuously by radiology nursing throughout the procedure under my direct supervision. COMPLICATIONS: None PROCEDURE: Informed written consent was obtained from the patient after a thorough discussion of the procedural risks, benefits and alternatives. All questions were addressed. Maximal Sterile Barrier Technique was utilized including caps, mask, sterile gowns, sterile gloves, sterile drape, hand hygiene and skin antiseptic. A timeout was performed prior to the initiation of the procedure. Patient was positioned supine position on the CT gantry table. Scout CT acquired for planning purposes. We first addressed the right lower quadrant fluid. The patient was then prepped and draped in the usual sterile fashion. 1% lidocaine was used for local anesthesia. Using CT guidance, a trocar needle was advanced into the small fluid and gas collection of the right lower quadrant, lateral to the psoas muscle. Once we confirmed needle tip position, we aspirated approximately 5 cc of purulent fluid. A drain was not placed given the small size of this fluid collection. Sterile dressing was placed.  Sample was sent for culture. We then address the left lower quadrant fluid. The patient was repositioned into left anterior oblique position. Scout CT acquired for planning purposes. The patient was then prepped and draped in the usual sterile fashion. 1% lidocaine was used for local anesthesia. Using CT guidance, trocar needle was advanced into the fluid collection of  the left lower quadrant. Using modified Seldinger technique, a 10 French drain was placed into the collection. Approximately 20 cc of frankly purulent material was aspirated. Sample sent for culture. The drain was sutured in position and attached to bulb drainage. Final CT was acquired. Patient tolerated the procedure well and remained hemodynamically stable throughout. No complications were encountered and no significant blood loss. IMPRESSION: Status post CT-guided aspiration of right lower quadrant fluid and drainage of left lower quadrant fluid. Signed, Dulcy Fanny. Nadene Rubins, RPVI [facility] Radiology Electronically Signed   By: Corrie Mckusick D.O.   On: 03/15/2022 14:57   CT ASPIRATION N/S  Result Date: 03/15/2022 INDICATION: 81 year old female with multifocal fluid in collections, status post surgery, referred for drainage EXAM: CT-GUIDED ASPIRATION RIGHT LOWER QUADRANT FLUID CT-GUIDED DRAINAGE LEFT LOWER QUADRANT FLUID MEDICATIONS: The patient is currently admitted to the hospital and receiving intravenous antibiotics. The antibiotics were administered within an appropriate time frame prior to the initiation of the procedure. ANESTHESIA/SEDATION: Moderate (conscious) sedation was employed during this procedure. A total of Versed 1.0 mg and Fentanyl 50 mcg was administered intravenously by the radiology nurse. Total intra-service moderate Sedation Time: 39 minutes. The patient's level of consciousness and vital signs were monitored continuously by radiology nursing throughout the procedure under my direct supervision. COMPLICATIONS: None PROCEDURE: Informed written consent was obtained from the patient after a thorough discussion of the procedural risks, benefits and alternatives. All questions were addressed. Maximal Sterile Barrier Technique was utilized including caps, mask, sterile gowns, sterile gloves, sterile drape, hand hygiene and skin  antiseptic. A timeout was performed prior to the initiation of the procedure. Patient was positioned supine position on the CT gantry table. Scout  CT acquired for planning purposes. We first addressed the right lower quadrant fluid. The patient was then prepped and draped in the usual sterile fashion. 1% lidocaine was used for local anesthesia. Using CT guidance, a trocar needle was advanced into the small fluid and gas collection of the right lower quadrant, lateral to the psoas muscle. Once we confirmed needle tip position, we aspirated approximately 5 cc of purulent fluid. A drain was not placed given the small size of this fluid collection. Sterile dressing was placed.  Sample was sent for culture. We then address the left lower quadrant fluid. The patient was repositioned into left anterior oblique position. Scout CT acquired for planning purposes. The patient was then prepped and draped in the usual sterile fashion. 1% lidocaine was used for local anesthesia. Using CT guidance, trocar needle was advanced into the fluid collection of the left lower quadrant. Using modified Seldinger technique, a 10 French drain was placed into the collection. Approximately 20 cc of frankly purulent material was aspirated. Sample sent for culture. The drain was sutured in position and attached to bulb drainage. Final CT was acquired. Patient tolerated the procedure well and remained hemodynamically stable throughout. No complications were encountered and no significant blood loss. IMPRESSION: Status post CT-guided aspiration of right lower quadrant fluid and drainage of left lower quadrant fluid. Signed, Dulcy Fanny. Nadene Rubins, RPVI [facility] Radiology Electronically Signed   By: Corrie Mckusick D.O.   On: 03/15/2022 14:57        Scheduled Meds:  ascorbic acid  500 mg Oral BID   cholecalciferol  5,000 Units Oral Daily   vitamin B-12  1,000 mcg Oral Daily   feeding  supplement  237 mL Oral TID BM   leptospermum manuka honey  1 Application Topical Daily   loperamide  2 mg Oral TID   mometasone-formoterol  2 puff Inhalation BID   montelukast  10 mg Oral QHS   pantoprazole  40 mg Oral BID AC   sodium chloride flush  10-40 mL Intracatheter Q12H   sodium chloride flush  5 mL Intracatheter Q8H   Continuous Infusions:  ciprofloxacin 400 mg (03/16/22 0056)   heparin 1,150 Units/hr (03/16/22 0448)   metronidazole 500 mg (03/16/22 0435)     LOS: 2 days    Time spent: 30 mins    Wyvonnia Dusky, MD [facility] Pager [phone]  If 7PM-7AM, please contact night-coverage [email or web address] 03/16/2022, 8:20 AM

## 2022-03-16 NOTE — Plan of Care (Signed)
  Problem: Fluid Volume: Goal: Hemodynamic stability will improve Outcome: Progressing   Problem: Clinical Measurements: Goal: Diagnostic test results will improve Outcome: Progressing Goal: Signs and symptoms of infection will decrease Outcome: Progressing   Problem: Clinical Measurements: Goal: Signs and symptoms of infection will decrease Outcome: Progressing

## 2022-03-16 NOTE — Progress Notes (Signed)
Patient ID: Lindsey Thomas, female   DOB: October 05, 1940, 81 y.o.   MRN: [id number]     [facility] Day(s): 2.   Interval History: Patient seen and examined, no acute events or new complaints overnight. Patient reports feeling any better this morning.  Pain slowly improving.  Denies any nausea or vomiting.  Having better appetite.  She was able to have drain of the pelvic abscess that yesterday.  No sign of complications.  Vital signs in last 24 hours: [min-max] current  Temp:  [97.9 F (36.6 C)-98.5 F (36.9 C)] 97.9 F (36.6 C) (10/10 0829) Pulse Rate:  [98-106] 99 (10/10 0829) Resp:  [16-18] 17 (10/10 0829) BP: (97-135)/(47-107) 104/59 (10/10 0829) SpO2:  [94 %-100 %] 99 % (10/10 0829)     Height: 4\' 9"  (144.8 cm) Weight: 73.3 kg BMI (Calculated): 34.96   Physical Exam:  Constitutional: alert, cooperative and no distress  Respiratory: breathing non-labored at rest  Cardiovascular: regular rate and sinus rhythm  Gastrointestinal: soft, non-tender, and non-distended.  Ileostomy is pink and patent.  Labs:     Latest Ref Rng & Units 03/16/2022    5:21 AM 03/15/2022    5:21 AM 03/14/2022    5:14 AM  CBC  WBC 4.0 - 10.5 K/uL 11.0  10.5  8.7   Hemoglobin 12.0 - 15.0 g/dL 7.9  7.6  8.5   Hematocrit 36.0 - 46.0 % 23.9  23.3  26.7   Platelets 150 - 400 K/uL 333  313  285       Latest Ref Rng & Units 03/16/2022    5:21 AM 03/15/2022    5:21 AM 03/14/2022    5:14 AM  CMP  Glucose 70 - 99 mg/dL 108  117  111   BUN 8 - 23 mg/dL 5  8  14    Creatinine 0.44 - 1.00 mg/dL 0.41  0.61  0.67   Sodium 135 - 145 mmol/L 130  130  131   Potassium 3.5 - 5.1 mmol/L 3.5  4.0  4.3   Chloride 98 - 111 mmol/L 105  109  108   CO2 22 - 32 mmol/L 19  17  17    Calcium 8.9 - 10.3 mg/dL 8.4  8.4  8.0     Imaging studies: No new pertinent imaging studies   Assessment/Plan:  81 y.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 -Status post percutaneous drainage of left pelvic abscess on  03/15/2022 -Vascular surgery input will be appreciated regarding management of her bilateral femoral-popliteal DVTs.  May restart anticoagulation with Eliquis 5 mg twice daily. -May continue regular diet -May restart PT and OT -I will continue to follow closely.  Arnold Long, MD

## 2022-03-16 NOTE — Progress Notes (Signed)
Supervising Physician: Ruthann Cancer  Patient Status:  Lindsey Thomas  Chief Complaint:  S/p LLQ pelvic abscess drain placement 10/9 by Dr Earleen Newport  Subjective:  Pt lying in bed awake at time of exam. She is accompanied by two family members. She states she is feeling better than she was before the drain, but she reports some moderate pain at the drain insertion site particularly when she moves.   Allergies: Eryc [erythromycin], Levofloxacin, Percocet [oxycodone-acetaminophen], Augmentin [amoxicillin-pot clavulanate], and Celecoxib  Medications: Prior to Admission medications   Medication Sig Start Date End Date Taking? Authorizing Provider  acetaminophen (TYLENOL) 500 MG tablet Take 1,000 mg by mouth daily as needed for pain.   Yes [provider]  apixaban (ELIQUIS) 5 MG TABS tablet Take 5 mg by mouth 2 (two) times daily.   Yes [provider]  aspirin EC 81 MG tablet Take 81 mg by mouth daily.   Yes [provider]  budesonide-formoterol (SYMBICORT) 160-4.5 MCG/ACT inhaler Inhale 2 puffs into the lungs 2 (two) times daily.   Yes [provider]  calcium carbonate (CALCIUM ANTACID) 500 MG chewable tablet Chew 2 tablets by mouth daily.   Yes [provider]  Cholecalciferol (VITAMIN D PO) Take 5,000 Units by mouth daily.   Yes [provider]  Cyanocobalamin (VITAMIN B-12 PO) Take 1,000 mcg by mouth daily.   Yes [provider]  fluticasone (FLONASE) 50 MCG/ACT nasal spray Place 2 sprays into both nostrils at bedtime.   Yes [provider]  furosemide (LASIX) 20 MG tablet Take 20 mg by mouth daily. 10/10/17  Yes [provider]  metoprolol tartrate (LOPRESSOR) 25 MG tablet Take 1 tablet (25 mg total) by mouth 2 (two) times daily. 02/20/22  Yes Annita Brod, MD  metroNIDAZOLE (FLAGYL) 500 MG tablet Take 500 mg by mouth 3 (three) times daily. 03/11/22 03/21/22 Yes [provider]  montelukast  (SINGULAIR) 10 MG tablet Take 10 mg by mouth at bedtime.    Yes [provider]  Multiple Vitamins-Minerals (PRESERVISION AREDS PO) Take 1 capsule by mouth 2 (two) times daily.   Yes [provider]  omeprazole (PRILOSEC) 20 MG capsule Take 20 mg by mouth 2 (two) times daily before a meal.   Yes [provider]  Polyethyl Glycol-Propyl Glycol (SYSTANE) 0.4-0.3 % SOLN Apply 1 drop to eye 2 (two) times daily.   Yes [provider]  sulfamethoxazole-trimethoprim (BACTRIM DS) 800-160 MG tablet Take 1 tablet by mouth 2 (two) times daily. 03/11/22 03/21/22 Yes [provider]  feeding supplement (ENSURE ENLIVE / ENSURE PLUS) LIQD Take 237 mLs by mouth 3 (three) times daily between meals. 01/30/22   [name], MD     Vital Signs: BP (!) 104/59 (BP Location: Left Arm)   Pulse 99   Temp 97.9 F (36.6 C) (Oral)   Resp 17   Ht 4\' 9"  (1.448 m)   Wt 161 lb 9.6 oz (73.3 kg)   SpO2 99%   BMI 34.97 kg/m   Physical Exam Vitals reviewed.  Constitutional:      General: She is not in acute distress.    Appearance: She is not ill-appearing.  HENT:     Head: Normocephalic.  Pulmonary:     Effort: Pulmonary effort is normal.  Abdominal:     Comments: LLQ drain in place, suture intact, dressing clean, dry, intact. Approximately 15cc of purulent fluid in JP bulb at time of exam.  Skin:  Comments: No bleeding, drainage, erythema, or induration appreciated at drain insertion site.   Neurological:     Mental Status: She is alert and oriented to person, place, and time.  Psychiatric:        Mood and Affect: Mood normal.        Behavior: Behavior normal.      Drain Location: LLQ Size: Fr size: 10 Fr Date of placement: 03/15/22  Currently to: Drain collection device: suction bulb 24 hour output:  Output by Drain (mL) 03/14/22 0701 - 03/14/22 1900 03/14/22 1901 - 03/15/22 0700 03/15/22 0701 - 03/15/22 1900 03/15/22 1901 - 03/16/22 0700 03/16/22 0701 -  03/16/22 1415  Closed System Drain 1 Lateral LLQ Bulb (JP)    60     Interval imaging/drain manipulation:  None  Current examination: Flushes with resistance, patient reports some pain with flushing. Not able to aspirate. Insertion site unremarkable. Suture in place. Dressed appropriately.       Imaging: CT GUIDED PERITONEAL/RETROPERITONEAL FLUID DRAIN BY PERC CATH  Result Date: 03/15/2022 INDICATION: 81 year old female with multifocal fluid in collections, status post surgery, referred for drainage EXAM: CT-GUIDED ASPIRATION RIGHT LOWER QUADRANT FLUID CT-GUIDED DRAINAGE LEFT LOWER QUADRANT FLUID MEDICATIONS: The patient is currently admitted to the hospital and receiving intravenous antibiotics. The antibiotics were administered within an appropriate time frame prior to the initiation of the procedure. ANESTHESIA/SEDATION: Moderate (conscious) sedation was employed during this procedure. A total of Versed 1.0 mg and Fentanyl 50 mcg was administered intravenously by the radiology nurse. Total intra-service moderate Sedation Time: 39 minutes. The patient's level of consciousness and vital signs were monitored continuously by radiology nursing throughout the procedure under my direct supervision. COMPLICATIONS: None PROCEDURE: Informed written consent was obtained from the patient after a thorough discussion of the procedural risks, benefits and alternatives. All questions were addressed. Maximal Sterile Barrier Technique was utilized including caps, mask, sterile gowns, sterile gloves, sterile drape, hand hygiene and skin antiseptic. A timeout was performed prior to the initiation of the procedure. Patient was positioned supine position on the CT gantry table. Scout CT acquired for planning purposes. We first addressed the right lower quadrant fluid. The patient was then prepped and draped in the usual sterile fashion. 1% lidocaine was used for local anesthesia. Using CT guidance, a trocar needle  was advanced into the small fluid and gas collection of the right lower quadrant, lateral to the psoas muscle. Once we confirmed needle tip position, we aspirated approximately 5 cc of purulent fluid. A drain was not placed given the small size of this fluid collection. Sterile dressing was placed.  Sample was sent for culture. We then address the left lower quadrant fluid. The patient was repositioned into left anterior oblique position. Scout CT acquired for planning purposes. The patient was then prepped and draped in the usual sterile fashion. 1% lidocaine was used for local anesthesia. Using CT guidance, trocar needle was advanced into the fluid collection of the left lower quadrant. Using modified Seldinger technique, a 10 French drain was placed into the collection. Approximately 20 cc of frankly purulent material was aspirated. Sample sent for culture. The drain was sutured in position and attached to bulb drainage. Final CT was acquired. Patient tolerated the procedure well and remained hemodynamically stable throughout. No complications were encountered and no significant blood loss. IMPRESSION: Status post CT-guided aspiration of right lower quadrant fluid and drainage of left lower quadrant fluid. Signed, Dulcy Fanny. Nadene Rubins, RPVI [facility]  Radiology Electronically Signed   By: Corrie Mckusick D.O.   On: 03/15/2022 14:57   CT ASPIRATION N/S  Result Date: 03/15/2022 INDICATION: 81 year old female with multifocal fluid in collections, status post surgery, referred for drainage EXAM: CT-GUIDED ASPIRATION RIGHT LOWER QUADRANT FLUID CT-GUIDED DRAINAGE LEFT LOWER QUADRANT FLUID MEDICATIONS: The patient is currently admitted to the hospital and receiving intravenous antibiotics. The antibiotics were administered within an appropriate time frame prior to the initiation of the procedure. ANESTHESIA/SEDATION: Moderate (conscious) sedation was employed  during this procedure. A total of Versed 1.0 mg and Fentanyl 50 mcg was administered intravenously by the radiology nurse. Total intra-service moderate Sedation Time: 39 minutes. The patient's level of consciousness and vital signs were monitored continuously by radiology nursing throughout the procedure under my direct supervision. COMPLICATIONS: None PROCEDURE: Informed written consent was obtained from the patient after a thorough discussion of the procedural risks, benefits and alternatives. All questions were addressed. Maximal Sterile Barrier Technique was utilized including caps, mask, sterile gowns, sterile gloves, sterile drape, hand hygiene and skin antiseptic. A timeout was performed prior to the initiation of the procedure. Patient was positioned supine position on the CT gantry table. Scout CT acquired for planning purposes. We first addressed the right lower quadrant fluid. The patient was then prepped and draped in the usual sterile fashion. 1% lidocaine was used for local anesthesia. Using CT guidance, a trocar needle was advanced into the small fluid and gas collection of the right lower quadrant, lateral to the psoas muscle. Once we confirmed needle tip position, we aspirated approximately 5 cc of purulent fluid. A drain was not placed given the small size of this fluid collection. Sterile dressing was placed.  Sample was sent for culture. We then address the left lower quadrant fluid. The patient was repositioned into left anterior oblique position. Scout CT acquired for planning purposes. The patient was then prepped and draped in the usual sterile fashion. 1% lidocaine was used for local anesthesia. Using CT guidance, trocar needle was advanced into the fluid collection of the left lower quadrant. Using modified Seldinger technique, a 10 French drain was placed into the collection. Approximately 20 cc of frankly purulent material was aspirated. Sample sent for culture. The drain was sutured in  position and attached to bulb drainage. Final CT was acquired. Patient tolerated the procedure well and remained hemodynamically stable throughout. No complications were encountered and no significant blood loss. IMPRESSION: Status post CT-guided aspiration of right lower quadrant fluid and drainage of left lower quadrant fluid. Signed, Dulcy Fanny. Nadene Rubins, RPVI [facility] Radiology Electronically Signed   By: Corrie Mckusick D.O.   On: 03/15/2022 14:57   DG Chest 2 View  Result Date: 03/13/2022 CLINICAL DATA:  Suspected sepsis EXAM: CHEST - 2 VIEW COMPARISON:  Chest x-ray February 17, 2022 FINDINGS: Stable cardiomediastinal contours. Unchanged calcified atherosclerosis of the aortic arch. Both lungs are clear. The visualized skeletal structures are unremarkable. IMPRESSION: No acute cardiopulmonary abnormality. Electronically Signed   By: Beryle Flock M.D.   On: 03/13/2022 10:56    Labs:  CBC: Recent Labs    03/13/22 1032 03/14/22 0514 03/15/22 0521 03/16/22 0521  WBC 8.5 8.7 10.5 11.0*  HGB 9.2* 8.5* 7.6* 7.9*  HCT 28.7* 26.7* 23.3* 23.9*  PLT 311 285 313 333    COAGS: Recent Labs    01/18/22 2100 02/17/22 1630 02/18/22 0545 03/13/22 1346 03/13/22 1719 03/14/22 1504 03/14/22 2253 03/15/22 2339 03/16/22 0858  INR  1.2 1.3* 1.2 1.7*  --   --   --   --   --   APTT 29  --   --   --    < > 85* 93* 61* 90*   < > = values in this interval not displayed.    BMP: Recent Labs    03/13/22 1032 03/14/22 0514 03/15/22 0521 03/16/22 0521  NA 130* 131* 130* 130*  K 3.9 4.3 4.0 3.5  CL 103 108 109 105  CO2 18* 17* 17* 19*  GLUCOSE 89 111* 117* 108*  BUN 19 14 8  <5*  CALCIUM 8.4* 8.0* 8.4* 8.4*  CREATININE 1.00 0.67 0.61 0.41*  GFRNONAA 56* >60 >60 >60    LIVER FUNCTION TESTS: Recent Labs    01/12/22 2051 01/18/22 0929 02/17/22 1630 03/13/22 1032  BILITOT 0.6 1.1 0.3 0.4  AST 18 29 12* 13*  ALT 15 27 9 8    ALKPHOS 102 75 77 88  PROT 6.1* 5.9* 5.4* 6.7  ALBUMIN 3.9 3.4* 2.0* 2.9*    Assessment:  Lindsey Thomas is an 81 yo female s/p LLQ drain placement 03/15/22 for treatment of a pelvic abscess drain. Drain is currently functioning well without complication. Patient reports moderate pain at drain insertion site.   Plan: Continue TID flushes with 5 cc NS. Record output Q shift. Dressing changes QD or PRN if soiled.  Call IR APP or on call IR MD if difficulty flushing or sudden change in drain output.  Repeat imaging/possible drain injection once output < 10 mL/QD (excluding flush material). Consideration for drain removal if output is < 10 mL/QD (excluding flush material), pending discussion with the providing surgical service.  Discharge planning: Please contact IR APP or on call IR MD prior to patient d/c to ensure appropriate follow up plans are in place. Typically patient will follow up with IR clinic 10-14 days post d/c for repeat imaging/possible drain injection. IR scheduler will contact patient with date/time of appointment. Patient will need to flush drain QD with 5 cc NS, record output QD, dressing changes every 2-3 days or earlier if soiled.   IR will continue to follow - please call with questions or concerns.  Electronically Signed: Lura Em, PA-C 03/16/2022, 2:09 PM   I spent a total of 15 Minutes at the the patient's bedside AND on the patient's hospital floor or unit, greater than 50% of which was counseling/coordinating care for s/p left pelvic abscess drain.

## 2022-03-17 DIAGNOSIS — E669 Obesity, unspecified: Secondary | ICD-10-CM

## 2022-03-17 DIAGNOSIS — K651 Peritoneal abscess: Secondary | ICD-10-CM | POA: Diagnosis not present

## 2022-03-17 DIAGNOSIS — E871 Hypo-osmolality and hyponatremia: Secondary | ICD-10-CM | POA: Diagnosis not present

## 2022-03-17 DIAGNOSIS — I82723 Chronic embolism and thrombosis of deep veins of upper extremity, bilateral: Secondary | ICD-10-CM | POA: Diagnosis not present

## 2022-03-17 LAB — CBC
HCT: 22.3 % — ABNORMAL LOW (ref 36.0–46.0)
Hemoglobin: 7.3 g/dL — ABNORMAL LOW (ref 12.0–15.0)
MCH: 27.7 pg (ref 26.0–34.0)
MCHC: 32.7 g/dL (ref 30.0–36.0)
MCV: 84.5 fL (ref 80.0–100.0)
Platelets: 334 10*3/uL (ref 150–400)
RBC: 2.64 MIL/uL — ABNORMAL LOW (ref 3.87–5.11)
RDW: 17.1 % — ABNORMAL HIGH (ref 11.5–15.5)
WBC: 10.9 10*3/uL — ABNORMAL HIGH (ref 4.0–10.5)
nRBC: 0 % (ref 0.0–0.2)

## 2022-03-17 LAB — BASIC METABOLIC PANEL
Anion gap: 5 (ref 5–15)
BUN: <5 mg/dL — ABNORMAL LOW (ref 8–23)
CO2: 21 mmol/L — ABNORMAL LOW (ref 22–32)
Calcium: 8.1 mg/dL — ABNORMAL LOW (ref 8.9–10.3)
Chloride: 104 mmol/L (ref 98–111)
Creatinine, Ser: 0.48 mg/dL (ref 0.44–1.00)
GFR, Estimated: >60 mL/min (ref >60)
Glucose, Bld: 113 mg/dL — ABNORMAL HIGH (ref 70–99)
Potassium: 3.4 mmol/L — ABNORMAL LOW (ref 3.5–5.1)
Sodium: 130 mmol/L — ABNORMAL LOW (ref 135–145)

## 2022-03-17 LAB — PROCALCITONIN: Procalcitonin: 0.11 ng/mL

## 2022-03-17 LAB — HEPARIN LEVEL (UNFRACTIONATED): Heparin Unfractionated: 0.25 IU/mL — ABNORMAL LOW (ref 0.30–0.70)

## 2022-03-17 LAB — MAGNESIUM: Magnesium: 2.2 mg/dL (ref 1.7–2.4)

## 2022-03-17 MED ORDER — TRAMADOL HCL 50 MG PO TABS: 50.0000 mg | ORAL_TABLET | Freq: Four times a day (QID) | ORAL | 0 refills | Status: DC | PRN

## 2022-03-17 MED ORDER — SALINE FLUSH 0.9 % IV SOLN: INTRAVENOUS | 0 refills | Status: DC

## 2022-03-17 MED ORDER — APIXABAN 5 MG PO TABS
5.0000 mg | ORAL_TABLET | Freq: Two times a day (BID) | ORAL | Status: DC
  Administered 2022-03-17: 5 mg via ORAL
  Filled 2022-03-17: qty 1

## 2022-03-17 MED ORDER — MEDIHONEY WOUND/BURN DRESSING EX PSTE: PASTE | CUTANEOUS | 1 refills | Status: DC

## 2022-03-17 MED ORDER — ASCORBIC ACID 500 MG PO TABS: 500.0000 mg | ORAL_TABLET | Freq: Two times a day (BID) | ORAL | 1 refills | Status: DC

## 2022-03-17 MED ORDER — CEPHALEXIN 500 MG PO CAPS: 500.0000 mg | ORAL_CAPSULE | Freq: Four times a day (QID) | ORAL | 0 refills | Status: AC

## 2022-03-17 NOTE — Discharge Instructions (Signed)
Finish all antibiotics by 10/15 night

## 2022-03-17 NOTE — Evaluation (Signed)
Physical Therapy Evaluation Patient Details Name: Lindsey Thomas MRN: [id number] DOB: 1940-10-04 Today's Date: 03/17/2022  History of Present Illness  Ms. 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 Patient found to have recurrent pelvic abscess.Status post percutaneous drainage of left pelvic abscess on 03/15/2022. Has midline abdominal wound, JP drain and B pop-fem DVTs.  Clinical Impression  Patient received in bed, husband at bedside. She is agreeable to PT assessment. Patient required min A to raise trunk to seated position due to abdominal pain. Min guard for transfers with cues for safety. Min guard for ambulation of 120 feet with RW. She will continue to benefit from skilled PT to improve mobility and strength.          Recommendations for follow up therapy are one component of a multi-disciplinary discharge planning process, led by the attending physician.  Recommendations may be updated based on patient status, additional functional criteria and insurance authorization.  Follow Up Recommendations Home health PT Can patient physically be transported by private vehicle: Yes    Assistance Recommended at Discharge Frequent or constant Supervision/Assistance  Patient can return home with the following  A little help with walking and/or transfers;A little help with bathing/dressing/bathroom;Help with stairs or ramp for entrance;Assist for transportation    Equipment Recommendations None recommended by PT  Recommendations for Other Services       Functional Status Assessment Patient has had a recent decline in their functional status and demonstrates the ability to make significant improvements in function in a  reasonable and predictable amount of time.     Precautions / Restrictions Precautions Precautions: Fall Restrictions Weight Bearing Restrictions: No      Mobility  Bed Mobility Overal bed mobility: Needs Assistance Bed Mobility: Supine to Sit     Supine to sit: Min assist     General bed mobility comments: min A for raising trunk due to pain in abdomen.    Transfers Overall transfer level: Needs assistance Equipment used: Rolling walker (2 wheels) Transfers: Sit to/from Stand Sit to Stand: Min guard                Ambulation/Gait Ambulation/Gait assistance: Min guard Gait Distance (Feet): 120 Feet Assistive device: Rolling walker (2 wheels) Gait Pattern/deviations: Step-through pattern, Decreased step length - right, Decreased step length - left, Decreased stride length Gait velocity: decr     General Gait Details: ambulated out in hall, did not want to "push it" too much, min guard  Stairs            Wheelchair Mobility    Modified Rankin (Stroke Patients Only)       Balance Overall balance assessment: Modified Independent Sitting-balance support: Feet supported Sitting balance-Leahy Scale: Good     Standing balance support: Bilateral upper extremity supported, During functional activity, Reliant on assistive device for balance Standing balance-Leahy Scale: Good                               Pertinent Vitals/Pain Pain Assessment Pain Assessment: Faces Faces Pain Scale: Hurts little more Pain Location: abdomen Pain Descriptors / Indicators: Discomfort, Grimacing, Sore Pain Intervention(s): Monitored during session, Repositioned    Home Living Family/patient expects to be discharged to::  Private residence Living Arrangements: Spouse/significant other Available Help at Discharge: Available 24 hours/day Type of Home: House Home Access: Stairs to enter Entrance Stairs-Rails: None Entrance Stairs-Number of Steps: 2   Home  Layout: One level Home Equipment: Conservation officer, nature (2 wheels);Cane - single point;Wheelchair - manual;BSC/3in1;Shower seat - built in      Prior Function Prior Level of Function : Needs assist             Mobility Comments: Pt reports ambulating short distance at home using Rollator ADLs Comments: gets assist from husband/daughter     Hand Dominance        Extremity/Trunk Assessment   Upper Extremity Assessment Upper Extremity Assessment: Generalized weakness    Lower Extremity Assessment Lower Extremity Assessment: Generalized weakness    Cervical / Trunk Assessment Cervical / Trunk Assessment: Normal  Communication   Communication: No difficulties  Cognition Arousal/Alertness: Awake/alert Behavior During Therapy: WFL for tasks assessed/performed Overall Cognitive Status: Within Functional Limits for tasks assessed                                          General Comments      Exercises     Assessment/Plan    PT Assessment Patient needs continued PT services  PT Problem List Decreased strength;Decreased activity tolerance;Decreased mobility;Pain;Decreased skin integrity       PT Treatment Interventions DME instruction;Gait training;Functional mobility training;Therapeutic activities;Therapeutic exercise;Balance training;Neuromuscular re-education;Patient/family education;Stair training    PT Goals (Current goals can be found in the Care Plan section)  Acute Rehab PT Goals Patient Stated Goal: to go home with family PT Goal Formulation: With patient/family Time For Goal Achievement: 03/31/22 Potential to Achieve Goals: Good    Frequency Min 2X/week     Co-evaluation               AM-PAC PT "6 Clicks" Mobility  Outcome Measure Help needed turning from your back to your side while in a flat bed without using bedrails?: A Little Help needed moving from lying on your back to sitting on the side of a flat bed without using  bedrails?: A Little Help needed moving to and from a bed to a chair (including a wheelchair)?: A Little Help needed standing up from a chair using your arms (e.g., wheelchair or bedside chair)?: A Little Help needed to walk in hospital room?: A Little Help needed climbing 3-5 steps with a railing? : A Little 6 Click Score: 18    End of Session Equipment Utilized During Treatment: Gait belt Activity Tolerance: Patient tolerated treatment well Patient left: in chair;with call bell/phone within reach;with family/visitor present;with chair alarm set Nurse Communication: Mobility status PT Visit Diagnosis: Muscle weakness (generalized) (M62.81);Difficulty in walking, not elsewhere classified (R26.2);Pain Pain - Right/Left:  (abdominal)    Time: 2376-2831 PT Time Calculation (min) (ACUTE ONLY): 22 min   Charges:   PT Evaluation $PT Eval Moderate Complexity: 1 Mod PT Treatments $Gait Training: 8-22 mins        Mcarthur Ivins, PT, GCS 03/17/22,12:29 PM

## 2022-03-17 NOTE — Plan of Care (Signed)
Problem: Fluid Volume: Goal: Hemodynamic stability will improve 03/17/2022 1513 by Ardelia Mems, RN Outcome: Completed/Met 03/17/2022 0936 by Ardelia Mems, RN Outcome: Progressing   Problem: Clinical Measurements: Goal: Diagnostic test results will improve 03/17/2022 1513 by Ardelia Mems, RN Outcome: Completed/Met 03/17/2022 0936 by Ardelia Mems, RN Outcome: Progressing Goal: Signs and symptoms of infection will decrease 03/17/2022 1513 by Ardelia Mems, RN Outcome: Completed/Met 03/17/2022 0936 by Ardelia Mems, RN Outcome: Progressing   Problem: Respiratory: Goal: Ability to maintain adequate ventilation will improve 03/17/2022 1513 by Ardelia Mems, RN Outcome: Completed/Met 03/17/2022 0936 by Ardelia Mems, RN Outcome: Progressing   Problem: Education: Goal: Knowledge of General Education information will improve Description: Including pain rating scale, medication(s)/side effects and non-pharmacologic comfort measures 03/17/2022 1513 by Ardelia Mems, RN Outcome: Completed/Met 03/17/2022 0936 by Ardelia Mems, RN Outcome: Progressing   Problem: Health Behavior/Discharge Planning: Goal: Ability to manage health-related needs will improve 03/17/2022 1513 by Ardelia Mems, RN Outcome: Completed/Met 03/17/2022 0936 by Ardelia Mems, RN Outcome: Progressing   Problem: Clinical Measurements: Goal: Ability to maintain clinical measurements within normal limits will improve 03/17/2022 1513 by Ardelia Mems, RN Outcome: Completed/Met 03/17/2022 0936 by Ardelia Mems, RN Outcome: Progressing Goal: Will remain free from infection 03/17/2022 1513 by Ardelia Mems, RN Outcome: Completed/Met 03/17/2022 0936 by Ardelia Mems, RN Outcome: Progressing Goal: Diagnostic test results will improve 03/17/2022 1513 by Ardelia Mems, RN Outcome: Completed/Met 03/17/2022 0936  by Ardelia Mems, RN Outcome: Progressing Goal: Respiratory complications will improve 03/17/2022 1513 by Ardelia Mems, RN Outcome: Completed/Met 03/17/2022 0936 by Ardelia Mems, RN Outcome: Progressing Goal: Cardiovascular complication will be avoided 03/17/2022 1513 by Ardelia Mems, RN Outcome: Completed/Met 03/17/2022 0936 by Ardelia Mems, RN Outcome: Progressing   Problem: Activity: Goal: Risk for activity intolerance will decrease 03/17/2022 1513 by Ardelia Mems, RN Outcome: Completed/Met 03/17/2022 0936 by Ardelia Mems, RN Outcome: Progressing   Problem: Nutrition: Goal: Adequate nutrition will be maintained 03/17/2022 1513 by Ardelia Mems, RN Outcome: Completed/Met 03/17/2022 0936 by Ardelia Mems, RN Outcome: Progressing   Problem: Coping: Goal: Level of anxiety will decrease 03/17/2022 1513 by Ardelia Mems, RN Outcome: Completed/Met 03/17/2022 0936 by Ardelia Mems, RN Outcome: Progressing   Problem: Elimination: Goal: Will not experience complications related to bowel motility 03/17/2022 1513 by Ardelia Mems, RN Outcome: Completed/Met 03/17/2022 0936 by Ardelia Mems, RN Outcome: Progressing Goal: Will not experience complications related to urinary retention 03/17/2022 1513 by Ardelia Mems, RN Outcome: Completed/Met 03/17/2022 0936 by Ardelia Mems, RN Outcome: Progressing   Problem: Pain Managment: Goal: General experience of comfort will improve 03/17/2022 1513 by Ardelia Mems, RN Outcome: Completed/Met 03/17/2022 0936 by Ardelia Mems, RN Outcome: Progressing   Problem: Safety: Goal: Ability to remain free from injury will improve 03/17/2022 1513 by Ardelia Mems, RN Outcome: Completed/Met 03/17/2022 0936 by Ardelia Mems, RN Outcome: Progressing   Problem: Skin Integrity: Goal: Risk for impaired skin integrity will  decrease 03/17/2022 1513 by Ardelia Mems, RN Outcome: Completed/Met 03/17/2022 0936 by Ardelia Mems, RN Outcome: Progressing   Problem: Acute Rehab PT Goals(only PT should resolve) Goal: Pt Will Go Supine/Side To Sit Outcome: Completed/Met Goal: Pt Will Go Sit To Supine/Side Outcome: Completed/Met Goal: Patient Will Transfer Sit To/From Stand Outcome: Completed/Met Goal: Pt Will Ambulate Outcome: Completed/Met Goal: Pt Will Go Up/Down Stairs Outcome: Completed/Met   Problem: Acute Rehab OT Goals (only OT should resolve) Goal: Pt. Will Perform Grooming Outcome: Completed/Met Goal: Pt. Will Perform Lower Body Dressing Outcome: Completed/Met Goal: Pt. Will Transfer To Toilet Outcome: Completed/Met Goal: Pt.  Will Perform Toileting-Clothing Manipulation Outcome: Completed/Met

## 2022-03-17 NOTE — TOC Transition Note (Signed)
Transition of Care [facility]) - CM/SW Discharge Note   Patient Details  Name: Lindsey Thomas MRN: [id number] Date of Birth: [birth year]  [facility] CM/SW Contact:  Colen Darling, [place] Phone Number: 03/17/2022, 3:40 PM   Clinical Narrative:     Patient has orders to discharge home today. Readmission prevention screen completed. Patient is current with PCP Dr. Leonie Douglas. [place]. She has family provide transport to appointments. Pharmacy is [facility] in [place] or [facility] mail order pharmacy. She does not have trouble getting medications. Prior home health was [facility], PT and OT.  DME in the home includes walker, commode, rollator, wheelchair, showerchair, grab bars and a cane. [facility] liaison is aware of discharge for today. No further concerns. CSW is signing off.   Final next level of care: Home w Home Health Services Barriers to Discharge: No Barriers Identified   Patient Goals and CMS Choice Patient states their goals for this hospitalization and ongoing recovery are:: discharge home   Choice offered to / list presented to : NA  Discharge Placement                Patient to be transferred to facility by: family transport   Patient and family notified of of transfer: 03/17/22  Discharge Plan and Services                          HH Arranged: PT, OT, RN [facility] [place] Agency: [place] Date [place]: 03/17/22   Representative spoke with at [place]: [place] ([place]) Interventions     Readmission Risk Interventions    03/17/2022    3:37 PM  Readmission Risk Prevention Plan  Transportation Screening Complete  Medication Review Press photographer) Complete  PCP or Specialist appointment within 3-5 days of discharge Complete  HRI or [place] Complete  SW Recovery Care/Counseling Consult Complete  [place] Not Applicable

## 2022-03-17 NOTE — Progress Notes (Signed)
Discharge instructions reviewed w/ patient and daughter [name]. Pt and daughter stated full understanding of the teaching and given discharge summary. All LDA's removed from patient and chart.

## 2022-03-17 NOTE — Progress Notes (Signed)
Referring Physician(s): Sheran Luz, [place]   Supervising Physician: Aletta Edouard  Patient Status:  Lindsey Thomas - In-pt  Chief Complaint: Intra-abdominal fluid collection s/p LLQ drain placement and RLQ fluid collection aspiration in IR 03/15/22 by Dr. Earleen Newport   Subjective: Patient laying in bed watching TV. She endorses some generalized pain/discomfort around the drain site.   Allergies: Eryc [erythromycin], Levofloxacin, Percocet [oxycodone-acetaminophen], Augmentin [amoxicillin-pot clavulanate], and Celecoxib  Medications: Prior to Admission medications   Medication Sig Start Date End Date Taking? Authorizing Provider  acetaminophen (TYLENOL) 500 MG tablet Take 1,000 mg by mouth daily as needed for pain.   Yes [provider]  apixaban (ELIQUIS) 5 MG TABS tablet Take 5 mg by mouth 2 (two) times daily.   Yes [provider]  aspirin EC 81 MG tablet Take 81 mg by mouth daily.   Yes [provider]  budesonide-formoterol (SYMBICORT) 160-4.5 MCG/ACT inhaler Inhale 2 puffs into the lungs 2 (two) times daily.   Yes [provider]  calcium carbonate (CALCIUM ANTACID) 500 MG chewable tablet Chew 2 tablets by mouth daily.   Yes [provider]  Cholecalciferol (VITAMIN D PO) Take 5,000 Units by mouth daily.   Yes [provider]  Cyanocobalamin (VITAMIN B-12 PO) Take 1,000 mcg by mouth daily.   Yes [provider]  fluticasone (FLONASE) 50 MCG/ACT nasal spray Place 2 sprays into both nostrils at bedtime.   Yes [provider]  furosemide (LASIX) 20 MG tablet Take 20 mg by mouth daily. 10/10/17  Yes [provider]  metoprolol tartrate (LOPRESSOR) 25 MG tablet Take 1 tablet (25 mg total) by mouth 2 (two) times daily. 02/20/22  Yes Annita Brod, MD  metroNIDAZOLE (FLAGYL) 500 MG tablet Take 500 mg by mouth 3 (three) times daily. 03/11/22 03/21/22 Yes [provider]  montelukast (SINGULAIR) 10 MG tablet Take  10 mg by mouth at bedtime.    Yes [provider]  Multiple Vitamins-Minerals (PRESERVISION AREDS PO) Take 1 capsule by mouth 2 (two) times daily.   Yes [provider]  omeprazole (PRILOSEC) 20 MG capsule Take 20 mg by mouth 2 (two) times daily before a meal.   Yes [provider]  Polyethyl Glycol-Propyl Glycol (SYSTANE) 0.4-0.3 % SOLN Apply 1 drop to eye 2 (two) times daily.   Yes [provider]  sulfamethoxazole-trimethoprim (BACTRIM DS) 800-160 MG tablet Take 1 tablet by mouth 2 (two) times daily. 03/11/22 03/21/22 Yes [provider]  feeding supplement (ENSURE ENLIVE / ENSURE PLUS) LIQD Take 237 mLs by mouth 3 (three) times daily between meals. 01/30/22   [name], MD     Vital Signs: BP (!) 103/54 (BP Location: Left Arm)   Pulse (!) 101   Temp 98.4 F (36.9 C) (Oral)   Resp 17   Ht 4\' 9"  (1.448 m)   Wt 161 lb 9.6 oz (73.3 kg)   SpO2 97%   BMI 34.97 kg/m   Physical Exam Constitutional:      General: She is not in acute distress.    Appearance: She is not ill-appearing.  Pulmonary:     Effort: Pulmonary effort is normal.  Abdominal:     Palpations: Abdomen is soft.     Tenderness: There is no abdominal tenderness.     Comments: LLQ drain to suction. Approximately 20 ml of dark brown fluid in bulb. Drain flushed with some resistance at first. Flushing caused the patient some initial discomfort.   Colostomy  Skin:  General: Skin is warm and dry.  Neurological:     Mental Status: She is alert and oriented to person, place, and time.     Imaging: CT GUIDED PERITONEAL/RETROPERITONEAL FLUID DRAIN BY PERC CATH  Result Date: 03/15/2022 INDICATION: 81 year old female with multifocal fluid in collections, status post surgery, referred for drainage EXAM: CT-GUIDED ASPIRATION RIGHT LOWER QUADRANT FLUID CT-GUIDED DRAINAGE LEFT LOWER QUADRANT FLUID MEDICATIONS: The patient is currently admitted to the hospital and receiving  intravenous antibiotics. The antibiotics were administered within an appropriate time frame prior to the initiation of the procedure. ANESTHESIA/SEDATION: Moderate (conscious) sedation was employed during this procedure. A total of Versed 1.0 mg and Fentanyl 50 mcg was administered intravenously by the radiology nurse. Total intra-service moderate Sedation Time: 39 minutes. The patient's level of consciousness and vital signs were monitored continuously by radiology nursing throughout the procedure under my direct supervision. COMPLICATIONS: None PROCEDURE: Informed written consent was obtained from the patient after a thorough discussion of the procedural risks, benefits and alternatives. All questions were addressed. Maximal Sterile Barrier Technique was utilized including caps, mask, sterile gowns, sterile gloves, sterile drape, hand hygiene and skin antiseptic. A timeout was performed prior to the initiation of the procedure. Patient was positioned supine position on the CT gantry table. Scout CT acquired for planning purposes. We first addressed the right lower quadrant fluid. The patient was then prepped and draped in the usual sterile fashion. 1% lidocaine was used for local anesthesia. Using CT guidance, a trocar needle was advanced into the small fluid and gas collection of the right lower quadrant, lateral to the psoas muscle. Once we confirmed needle tip position, we aspirated approximately 5 cc of purulent fluid. A drain was not placed given the small size of this fluid collection. Sterile dressing was placed.  Sample was sent for culture. We then address the left lower quadrant fluid. The patient was repositioned into left anterior oblique position. Scout CT acquired for planning purposes. The patient was then prepped and draped in the usual sterile fashion. 1% lidocaine was used for local anesthesia. Using CT guidance, trocar needle was advanced into the fluid collection of the left lower quadrant.  Using modified Seldinger technique, a 10 French drain was placed into the collection. Approximately 20 cc of frankly purulent material was aspirated. Sample sent for culture. The drain was sutured in position and attached to bulb drainage. Final CT was acquired. Patient tolerated the procedure well and remained hemodynamically stable throughout. No complications were encountered and no significant blood loss. IMPRESSION: Status post CT-guided aspiration of right lower quadrant fluid and drainage of left lower quadrant fluid. Signed, Dulcy Fanny. Nadene Rubins, RPVI [facility] Radiology Electronically Signed   By: Corrie Mckusick D.O.   On: 03/15/2022 14:57   CT ASPIRATION N/S  Result Date: 03/15/2022 INDICATION: 81 year old female with multifocal fluid in collections, status post surgery, referred for drainage EXAM: CT-GUIDED ASPIRATION RIGHT LOWER QUADRANT FLUID CT-GUIDED DRAINAGE LEFT LOWER QUADRANT FLUID MEDICATIONS: The patient is currently admitted to the hospital and receiving intravenous antibiotics. The antibiotics were administered within an appropriate time frame prior to the initiation of the procedure. ANESTHESIA/SEDATION: Moderate (conscious) sedation was employed during this procedure. A total of Versed 1.0 mg and Fentanyl 50 mcg was administered intravenously by the radiology nurse. Total intra-service moderate Sedation Time: 39 minutes. The patient's level of consciousness and vital signs were monitored continuously by radiology nursing throughout the procedure under my direct supervision. COMPLICATIONS: None PROCEDURE:  Informed written consent was obtained from the patient after a thorough discussion of the procedural risks, benefits and alternatives. All questions were addressed. Maximal Sterile Barrier Technique was utilized including caps, mask, sterile gowns, sterile gloves, sterile drape, hand hygiene and skin antiseptic. A timeout was  performed prior to the initiation of the procedure. Patient was positioned supine position on the CT gantry table. Scout CT acquired for planning purposes. We first addressed the right lower quadrant fluid. The patient was then prepped and draped in the usual sterile fashion. 1% lidocaine was used for local anesthesia. Using CT guidance, a trocar needle was advanced into the small fluid and gas collection of the right lower quadrant, lateral to the psoas muscle. Once we confirmed needle tip position, we aspirated approximately 5 cc of purulent fluid. A drain was not placed given the small size of this fluid collection. Sterile dressing was placed.  Sample was sent for culture. We then address the left lower quadrant fluid. The patient was repositioned into left anterior oblique position. Scout CT acquired for planning purposes. The patient was then prepped and draped in the usual sterile fashion. 1% lidocaine was used for local anesthesia. Using CT guidance, trocar needle was advanced into the fluid collection of the left lower quadrant. Using modified Seldinger technique, a 10 French drain was placed into the collection. Approximately 20 cc of frankly purulent material was aspirated. Sample sent for culture. The drain was sutured in position and attached to bulb drainage. Final CT was acquired. Patient tolerated the procedure well and remained hemodynamically stable throughout. No complications were encountered and no significant blood loss. IMPRESSION: Status post CT-guided aspiration of right lower quadrant fluid and drainage of left lower quadrant fluid. Signed, Dulcy Fanny. Nadene Rubins, RPVI [facility] Radiology Electronically Signed   By: Corrie Mckusick D.O.   On: 03/15/2022 14:57    Labs:  CBC: Recent Labs    03/14/22 0514 03/15/22 0521 03/16/22 0521 03/17/22 0401  WBC 8.7 10.5 11.0* 10.9*  HGB 8.5* 7.6* 7.9* 7.3*  HCT 26.7* 23.3* 23.9* 22.3*   PLT 285 313 333 334    COAGS: Recent Labs    01/18/22 2100 02/17/22 1630 02/18/22 0545 03/13/22 1346 03/13/22 1719 03/14/22 1504 03/14/22 2253 03/15/22 2339 03/16/22 0858  INR 1.2 1.3* 1.2 1.7*  --   --   --   --   --   APTT 29  --   --   --    < > 85* 93* 61* 90*   < > = values in this interval not displayed.    BMP: Recent Labs    03/14/22 0514 03/15/22 0521 03/16/22 0521 03/17/22 0401  NA 131* 130* 130* 130*  K 4.3 4.0 3.5 3.4*  CL 108 109 105 104  CO2 17* 17* 19* 21*  GLUCOSE 111* 117* 108* 113*  BUN 14 8 <5* <5*  CALCIUM 8.0* 8.4* 8.4* 8.1*  CREATININE 0.67 0.61 0.41* 0.48  GFRNONAA >60 >60 >60 >60    LIVER FUNCTION TESTS: Recent Labs    01/12/22 2051 01/18/22 0929 02/17/22 1630 03/13/22 1032  BILITOT 0.6 1.1 0.3 0.4  AST 18 29 12* 13*  ALT 15 27 9 8   ALKPHOS 102 75 77 88  PROT 6.1* 5.9* 5.4* 6.7  ALBUMIN 3.9 3.4* 2.0* 2.9*    Assessment and Plan:  Intra-abdominal fluid collection s/p LLQ drain placement and RLQ fluid collection aspiration in IR 03/15/22 by Dr. Stephenie Acres  Location: LLQ Size: Fr size: 10 Fr Date of placement: 03/15/22 Currently to: Drain collection device: suction bulb 24 hour output:  Output by Drain (mL) 03/15/22 0700 - 03/15/22 1459 03/15/22 1500 - 03/15/22 2259 03/15/22 2300 - 03/16/22 0659 03/16/22 0700 - 03/16/22 1459 03/16/22 1500 - 03/16/22 2259 03/16/22 2300 - 03/17/22 0659 03/17/22 0700 - 03/17/22 1107  Closed System Drain 1 Lateral LLQ Bulb (JP)  60  2   0    Interval imaging/drain manipulation:  None  Current examination: Flushes/aspirates easily.  Insertion site unremarkable. Suture and stat lock in place. Dressed appropriately.   Plan: Continue TID flushes with 5 cc NS. Record output Q shift. Dressing changes QD or PRN if soiled.  Call IR APP or on call IR MD if difficulty flushing or sudden change in drain output.  Repeat imaging/possible drain injection once output < 10 mL/QD (excluding flush  material). Consideration for drain removal if output is < 10 mL/QD (excluding flush material), pending discussion with the providing surgical service.  Discharge planning: Please contact IR APP or on call IR MD prior to patient d/c to ensure appropriate follow up plans are in place. Typically patient will follow up with IR clinic 10-14 days post d/c for repeat imaging/possible drain injection. IR scheduler will contact patient with date/time of appointment. Patient will need to flush drain QD with 5 cc NS, record output QD, dressing changes every 2-3 days or earlier if soiled.   IR will continue to follow - please call with questions or concerns.     Electronically Signed: Soyla Dryer, AGACNP-BC [phone] 03/17/2022, 11:04 AM   I spent a total of 15 Minutes at the the patient's bedside AND on the patient's hospital floor or unit, greater than 50% of which was counseling/coordinating care for LLQ pelvic abscess drain

## 2022-03-17 NOTE — Progress Notes (Signed)
ANTICOAGULATION CONSULT NOTE  Pharmacy Consult for IV heparin Indication: DVT  Patient Measurements: Height: 4\' 9"  (144.8 cm) Weight: 73.3 kg (161 lb 9.6 oz) IBW/kg (Calculated) : 38.6 Heparin Dosing Weight: 55.4 kg  Labs: Recent Labs    03/14/22 2253 03/15/22 0521 03/15/22 2339 03/16/22 0521 03/16/22 0858 03/16/22 1702 03/17/22 0401  HGB  --  7.6*  --  7.9*  --   --  7.3*  HCT  --  23.3*  --  23.9*  --   --  22.3*  PLT  --  313  --  333  --   --  334  APTT 93*  --  61*  --  90*  --   --   HEPARINUNFRC  --   --   --   --  0.39 0.21* 0.25*  CREATININE  --  0.61  --  0.41*  --   --  0.48     Estimated Creatinine Clearance: 44.2 mL/min (by C-G formula based on SCr of 0.48 mg/dL).   Medical History: Past Medical History:  Diagnosis Date   Anxiety    Arthritis    Asthma    DVT (deep venous thrombosis) (HCC)    Edema    feet   Emphysema of lung (HCC)    GERD (gastroesophageal reflux disease)    Headache    MIGRAINES   History of orthopnea    Hypercholesterolemia    Hypothyroidism    NODULES   Lymphedema    Migraine    Non-small cell lung cancer, right ([place]) 05/2019   Rad tx's   Shortness of breath dyspnea    WITH EXERTION   Sleep apnea    MILD, DOES NOT USE CPAP   Thyroid nodule     Medications:  Heparin Dosing Weight: 55.4 kg Last dose of Eliquis 03/12/22 at 2000  Assessment: 81 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 Pt has IR procedure planned for 10/9 and pharmacy has been consulted for heparin in the perioperative period. Pt on apixaban currently for bilateral DVT from 03/04/2022.  BL aPTT 43. H&H stable. Hgb consistent with baseline. BL INR elevated at 1.7.  Goal of Therapy:  Heparin level 0.3-0.7 units/ml Monitor platelets by anticoagulation protocol: Yes   10/8  0514 aPTT subthera x 1, HL 0.66 10/8  1504 aPTT therapeutic x 1 10/8   2253 aPTT 93, therapeutic x 2 10/9  0400 Heparin gtt held 10/9  1644 Heparin gtt resumed 1050 un/hr 10/9  2339 aPTT 61s; subthera; 1050 un/hr 10/10 0858 aPTT 90s; HL 0.39, thera x 1; 1150 un/hr  10/10 1702 HL 0.21, subthera, 1150>1250 un/hr 10/11 0401 HL 0.25, subthera; 1250 > 1350 un/hr  Hgb 8.5>7.6>7.9>7.3  Plan:  Heparin level is subtherapeutic. Per MAR review, no interruptions in heparin infusion Give heparin 800 unit IV bolus and increase heparin infusion to 1350 units/hr Re-check HL 8 hours from rate change Continue to monitor H&H and platelets daily while on heparin gtt  Lorna Dibble 03/17/2022 4:58 AM

## 2022-03-17 NOTE — Care Management Important Message (Signed)
Important Message  Patient Details  Name: Lindsey Thomas MRN: [id number] Date of Birth: 03-23-1941   Medicare Important Message Given:  Yes     Dannette Rochell 03/17/2022, 11:02 AM

## 2022-03-17 NOTE — Plan of Care (Signed)

## 2022-03-17 NOTE — Evaluation (Signed)
Occupational Therapy Evaluation Patient Details Name: Lindsey Thomas MRN: [id number] DOB: 07/23/1940 Today's Date: 03/17/2022   History of Present Illness Pt is an 81 year old female admitted wtih chief concerns of bloody and purulent discharge site of prior JP drain. Pt is s/p percutaneous drainage of left pelvic abscess on 03/15/2022. Pt found with  bilateral femoral-popliteal DVTs, opted for medical management.   Clinical Impression   Chart reviewed, pt greeted with husband present, agreeable to OT evaluation. Co tx completed with PT on this date. Per team, ok for mobility with DVTs as long as pt is normotensive. Pt BP 116/47 (Map 68) seated at ege of bed, HR 97, spo2 100% throughout. Pt and husband report assist required from family for all ADL/IADL since discharge from rehab to home. Pt presents with deficits in strength, endurance, balance, activity tolerance all affecting safe and optimal ADL completion. Vcs required throughout for body mechanics with abd drain/surgical history. Recommend discharge with [facility]. OT will continue to follow acutely.      Recommendations for follow up therapy are one component of a multi-disciplinary discharge planning process, led by the attending physician.  Recommendations may be updated based on patient status, additional functional criteria and insurance authorization.   Follow Up Recommendations  Home health OT    Assistance Recommended at Discharge Frequent or constant Supervision/Assistance  Patient can return home with the following A little help with walking and/or transfers;A little help with bathing/dressing/bathroom    Functional Status Assessment  Patient has had a recent decline in their functional status and demonstrates the ability to make significant improvements in function in a reasonable and predictable amount of time.  Equipment Recommendations  None recommended by OT    Recommendations for Other Services       Precautions /  Restrictions Precautions Precautions: Fall Precaution Comments: jp drain Restrictions Weight Bearing Restrictions: No      Mobility Bed Mobility Overal bed mobility: Needs Assistance Bed Mobility: Supine to Sit     Supine to sit: Min assist          Transfers Overall transfer level: Needs assistance Equipment used: Rolling walker (2 wheels) Transfers: Sit to/from Stand Sit to Stand: Min guard                  Balance Overall balance assessment: Needs assistance Sitting-balance support: Feet supported Sitting balance-Leahy Scale: Good     Standing balance support: Bilateral upper extremity supported, During functional activity, Reliant on assistive device for balance Standing balance-Leahy Scale: Good                             ADL either performed or assessed with clinical judgement   ADL Overall ADL's : Needs assistance/impaired Eating/Feeding: Set up;Sitting   Grooming: Wash/dry face;Oral care;Sitting;Set up           Upper Body Dressing : Minimal assistance;Sitting   Lower Body Dressing: Maximal assistance;Sitting/lateral leans   Toilet Transfer: Ambulation;Rolling walker (2 wheels);Supervision/safety;Min guard   Toileting- Clothing Manipulation and Hygiene: Supervision/safety;Sit to/from stand       Functional mobility during ADLs: Supervision/safety;Minimal assistance;Rolling walker (2 wheels)       Vision Baseline Vision/History: 1 Wears glasses Patient Visual Report: No change from baseline       Perception     Praxis      Pertinent Vitals/Pain Pain Assessment Pain Assessment: Faces Faces Pain Scale: Hurts little more Pain Location: abdomen Pain Descriptors /  Indicators: Discomfort, Grimacing, Sore Pain Intervention(s): Limited activity within patient's tolerance, Repositioned     Hand Dominance     Extremity/Trunk Assessment Upper Extremity Assessment Upper Extremity Assessment: Generalized weakness    Lower Extremity Assessment Lower Extremity Assessment: Generalized weakness   Cervical / Trunk Assessment Cervical / Trunk Assessment: Normal   Communication Communication Communication: No difficulties   Cognition Arousal/Alertness: Awake/alert Behavior During Therapy: WFL for tasks assessed/performed Overall Cognitive Status: Within Functional Limits for tasks assessed                                       General Comments  all lines/leads/drains intact following session    Exercises     Shoulder Instructions      Home Living Family/patient expects to be discharged to:: Private residence Living Arrangements: Spouse/significant other Available Help at Discharge: Available 24 hours/day Type of Home: House Home Access: Stairs to enter [employer] of Steps: 2 Entrance Stairs-Rails: None Home Layout: One level               Home Equipment: Conservation officer, nature (2 wheels);Cane - single point;Wheelchair - manual;BSC/3in1;Shower seat - built in          Prior Functioning/Environment Prior Level of Function : Needs assist             Mobility Comments: amb short household distances with rollator ADLs Comments: assist from family at this time for dressing, bathing, all IADLS        OT Problem List: Decreased strength;Decreased activity tolerance;Impaired balance (sitting and/or standing)      OT Treatment/Interventions: Self-care/ADL training;DME and/or AE instruction;Therapeutic activities;Therapeutic exercise;Energy conservation;Patient/family education;Modalities    OT Goals(Current goals can be found in the care plan section) Acute Rehab OT Goals Patient Stated Goal: go back home OT Goal Formulation: With patient Time For Goal Achievement: 03/31/22 Potential to Achieve Goals: Good ADL Goals Pt Will Perform Grooming: with supervision;standing Pt Will Perform Lower Body Dressing: with supervision;with adaptive equipment;sit to/from  stand Pt Will Transfer to Toilet: with supervision;ambulating Pt Will Perform Toileting - Clothing Manipulation and hygiene: with supervision;sit to/from stand  OT Frequency: Min 2X/week    Co-evaluation PT/OT/SLP Co-Evaluation/Treatment: Yes Reason for Co-Treatment: Complexity of the patient's impairments (multi-system involvement)          AM-PAC OT "6 Clicks" Daily Activity     Outcome Measure Help from another person eating meals?: None Help from another person taking care of personal grooming?: A Little Help from another person toileting, which includes using toliet, bedpan, or urinal?: A Little Help from another person bathing (including washing, rinsing, drying)?: A Little Help from another person to put on and taking off regular upper body clothing?: A Little Help from another person to put on and taking off regular lower body clothing?: A Lot 6 Click Score: 18   End of Session Equipment Utilized During Treatment: Gait belt;Right knee immobilizer Nurse Communication: Mobility status  Activity Tolerance: Patient tolerated treatment well Patient left: in chair;with call bell/phone within reach;with chair alarm set  OT Visit Diagnosis: Unsteadiness on feet (R26.81);Muscle weakness (generalized) (M62.81)                Time: 1035-1101 OT Time Calculation (min): 26 min Charges:  OT General Charges $OT Visit: 1 Visit OT Evaluation $OT Eval Moderate Complexity: 1 Mod Shanon Payor, OTD OTR/L  03/17/22, 1:01 PM

## 2022-03-18 LAB — CULTURE, BLOOD (ROUTINE X 2)
Culture: NO GROWTH
Culture: NO GROWTH
Special Requests: ADEQUATE
Special Requests: ADEQUATE

## 2022-03-18 NOTE — Discharge Summary (Signed)
Physician Discharge Summary   Patient: Lindsey Thomas MRN: [id number] DOB: Aug 03, 1940  Admit date:     03/13/2022  Discharge date: 03/17/2022  Discharge Physician: Annita Brod   PCP: Idelle Crouch, MD   Recommendations at discharge:   New medication: Vitamin C p.o. daily New medication: Keflex 500 p.o. 4 times a day, finish on 10/15 Medication clarification: Flagyl 500 p.o. 3 times daily continue until 10/15 New medication: Medihoney paste applied to sacral wound daily New medication: Saline flushes through JP drain. New medication: Ultram 50 mg p.o. 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  Patient was treated with fluids and antibiotics and discharged.  Patient returned on 10/7 with complaints of abdominal pain and found to have recurrent pelvic abscess.  Status post drain placed by interventional radiology on 10/9.  Treated with IV antibiotics.  By 10/12, patient feeling better and tolerating p.o. and felt to be stable for discharge from medical and surgical standpoint.    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 Continue w/ ostomy care. Blood cxs NGTD. S/p drain placed in LLQ by IR 03/15/22. Pelvic abscess  Cultures grew out Streptococcus anginosus with sensitivities pending.  Patient had been on IV Cipro and Flagyl during hospitalization and then discharged on Flagyl and Keflex for total of 10 days of therapy.  Outpatient follow-up with surgery in a few weeks.     Pressure injury of skin: present on admission.  Unstageable sacral ulcer.  Patient was continued on  her previous wound care. Pressure Injury 03/13/22 Sacrum Medial;Mid Unstageable - Full thickness tissue loss in which the base of the injury is covered by slough (yellow, tan, gray, green or brown) and/or eschar (tan, brown or black) in the wound bed. yellow center in gluteal cl (Active)  03/13/22 2145  Location: Sacrum  Location Orientation: Medial;Mid  Staging: Unstageable - Full thickness tissue loss in which the base of the injury is covered by slough (yellow, tan, gray, green or brown) and/or eschar (tan, brown or black) in the wound bed.  Wound Description (Comments): yellow center in gluteal cleft with seroussanguineous drainage  Present on Admission: Yes     Hyponatremia: Staying stable around 130.   Chronic b/l LE DVTs: Was previously on Eliquis.  This was stopped to allow for surgical and interventional radiology intervention.  Continued on heparin during hospitalization.  General surgery cleared to restart Eliquis on day of discharge.   Hypomagnesemia: mg sulfate ordered    Anxiety: severity unknown. Lorazepam prn    Normocytic anemia: Heme Gobin remained stable, at 7.3 on day of discharge.  Obesity: Met criteria BMI greater than 30      Pain control - [place] Controlled Substance Reporting System database was reviewed. and patient was instructed, not to drive, operate heavy machinery, perform activities at heights, swimming or participation in water activities or provide baby-sitting services while on Pain, Sleep and Anxiety Medications; until their outpatient Physician has advised to do so again. Also recommended to not to take more than prescribed Pain, Sleep and Anxiety Medications.  Consultants: General surgery, interventional radiology Procedures performed: Percutaneous drainage tube placed 10/9 Disposition: Home with home  health Diet recommendation:  Discharge Diet Orders (From admission, onward)     Start     Ordered   03/17/22 0000  Diet - low sodium heart  healthy        03/17/22 1429           Heart healthy diet DISCHARGE MEDICATION: Allergies as of 03/17/2022       Reactions   Eryc [erythromycin]    Levofloxacin Nausea Only   Percocet [oxycodone-acetaminophen] Nausea And Vomiting   Augmentin [amoxicillin-pot Clavulanate] Rash   Celecoxib Palpitations        Medication List     STOP taking these medications    sulfamethoxazole-trimethoprim 800-160 MG tablet Commonly known as: BACTRIM DS       TAKE these medications    acetaminophen 500 MG tablet Commonly known as: TYLENOL Take 1,000 mg by mouth daily as needed for pain.   apixaban 5 MG Tabs tablet Commonly known as: ELIQUIS Take 5 mg by mouth 2 (two) times daily.   ascorbic acid 500 MG tablet Commonly known as: VITAMIN C Take 1 tablet (500 mg total) by mouth 2 (two) times daily.   aspirin EC 81 MG tablet Take 81 mg by mouth daily.   budesonide-formoterol 160-4.5 MCG/ACT inhaler Commonly known as: SYMBICORT Inhale 2 puffs into the lungs 2 (two) times daily.   Calcium Antacid 500 MG chewable tablet Generic drug: calcium carbonate Chew 2 tablets by mouth daily.   cephALEXin 500 MG capsule Commonly known as: KEFLEX Take 1 capsule (500 mg total) by mouth 4 (four) times daily for 17 doses.   feeding supplement Liqd Take 237 mLs by mouth 3 (three) times daily between meals.   fluticasone 50 MCG/ACT nasal spray Commonly known as: FLONASE Place 2 sprays into both nostrils at bedtime.   furosemide 20 MG tablet Commonly known as: LASIX Take 20 mg by mouth daily.   leptospermum manuka honey Pste paste Apply Medihoney to sacrum wound Q day, then cover with foam dressing.  Change foam dressing Q 3 days or PRN soiling Apply thin layer (3 mm) to wound.   metoprolol tartrate 25 MG tablet Commonly known as: LOPRESSOR Take 1 tablet (25 mg total) by mouth 2 (two) times daily.   metroNIDAZOLE 500 MG tablet Commonly known as: FLAGYL Take 500 mg by mouth 3  (three) times daily.   montelukast 10 MG tablet Commonly known as: SINGULAIR Take 10 mg by mouth at bedtime.   omeprazole 20 MG capsule Commonly known as: PRILOSEC Take 20 mg by mouth 2 (two) times daily before a meal.   PRESERVISION AREDS PO Take 1 capsule by mouth 2 (two) times daily.   Saline Flush 0.9 % Soln Use as directed   Systane 0.4-0.3 % Soln Generic drug: Polyethyl Glycol-Propyl Glycol Apply 1 drop to eye 2 (two) times daily.   traMADol 50 MG tablet Commonly known as: ULTRAM Take 1 tablet (50 mg total) by mouth every 6 (six) hours as needed for moderate pain.   VITAMIN B-12 PO Take 1,000 mcg by mouth daily.   VITAMIN D PO Take 5,000 Units by mouth daily.               Discharge Care Instructions  (From admission, onward)           Start     Ordered   03/17/22 0000  Discharge wound care:       Comments: On Medihoney to wound on backside daily, then cover with foam dressing.  Changing from dressing every 3 days or sooner if soiled   03/17/22 1429            Follow-up Information     Herbert Pun, MD. Schedule an appointment as soon as possible for a visit on 03/23/2022.   Specialty: General Surgery Why: 10:30am appointment Contact information: [place] [address] [phone]         [facility] [place] Follow up.   Specialty: Home Health Services Why: They will resume home health services at discharge. Contact information: [place] [address] [phone]                Discharge Exam: Danley Danker Weights   03/13/22 1027 03/14/22 0600  Weight: 72 kg 73.3 kg   General: Alert and oriented x3, no acute distress Cardiovascular: Regular rate and rhythm, S1-S2 Lungs: Clear to auscultation bilaterally  Condition at discharge: good  The results of significant diagnostics from this hospitalization (including imaging, microbiology, ancillary and laboratory) are listed  below for reference.   Imaging Studies: CT GUIDED PERITONEAL/RETROPERITONEAL FLUID DRAIN BY PERC CATH  Result Date: 03/15/2022 INDICATION: 81 year old female with multifocal fluid in collections, status post surgery, referred for drainage EXAM: CT-GUIDED ASPIRATION RIGHT LOWER QUADRANT FLUID CT-GUIDED DRAINAGE LEFT LOWER QUADRANT FLUID MEDICATIONS: The patient is currently admitted to the hospital and receiving intravenous antibiotics. The antibiotics were administered within an appropriate time frame prior to the initiation of the procedure. ANESTHESIA/SEDATION: Moderate (conscious) sedation was employed during this procedure. A total of Versed 1.0 mg and Fentanyl 50 mcg was administered intravenously by the radiology nurse. Total intra-service moderate Sedation Time: 39 minutes. The patient's level of consciousness and vital signs were monitored continuously by radiology nursing throughout the procedure under my direct supervision. COMPLICATIONS: None PROCEDURE: Informed written consent was obtained from the patient after a thorough discussion of the procedural risks, benefits and alternatives. All questions were addressed. Maximal Sterile Barrier Technique was utilized including caps, mask, sterile gowns, sterile gloves, sterile drape, hand hygiene and skin antiseptic. A timeout was performed prior to the initiation of the procedure. Patient was positioned supine position on the CT gantry table. Scout CT acquired for planning purposes. We first addressed the right lower quadrant fluid. The patient was then prepped and draped in the usual sterile fashion. 1% lidocaine was used for local anesthesia. Using CT guidance, a trocar needle was advanced into the small fluid and gas collection of the right lower quadrant, lateral to the psoas muscle. Once we confirmed needle tip position, we aspirated approximately 5 cc of purulent fluid. A drain was not placed given the small size of this fluid collection. Sterile  dressing was placed.  Sample was sent for culture. We then address the left lower quadrant fluid. The patient was repositioned into left anterior oblique position. Scout CT acquired for planning purposes. The patient was then prepped and draped in the usual sterile fashion. 1% lidocaine was used for local anesthesia. Using CT guidance, trocar needle was advanced into the fluid collection of the left lower quadrant. Using modified Seldinger technique, a 10 French drain was placed into the collection. Approximately 20 cc of frankly purulent material was aspirated. Sample sent for culture. The drain was sutured in position and attached to bulb drainage. Final CT was acquired. Patient tolerated the procedure well and remained hemodynamically stable throughout. No complications were encountered and no significant blood loss. IMPRESSION: Status post CT-guided aspiration of right lower quadrant fluid and  drainage of left lower quadrant fluid. Signed, Dulcy Fanny. Nadene Rubins, RPVI [facility] Radiology Electronically Signed   By: Corrie Mckusick D.O.   On: 03/15/2022 14:57   CT ASPIRATION N/S  Result Date: 03/15/2022 INDICATION: 81 year old female with multifocal fluid in collections, status post surgery, referred for drainage EXAM: CT-GUIDED ASPIRATION RIGHT LOWER QUADRANT FLUID CT-GUIDED DRAINAGE LEFT LOWER QUADRANT FLUID MEDICATIONS: The patient is currently admitted to the hospital and receiving intravenous antibiotics. The antibiotics were administered within an appropriate time frame prior to the initiation of the procedure. ANESTHESIA/SEDATION: Moderate (conscious) sedation was employed during this procedure. A total of Versed 1.0 mg and Fentanyl 50 mcg was administered intravenously by the radiology nurse. Total intra-service moderate Sedation Time: 39 minutes. The patient's level of consciousness and vital signs were monitored continuously by radiology nursing  throughout the procedure under my direct supervision. COMPLICATIONS: None PROCEDURE: Informed written consent was obtained from the patient after a thorough discussion of the procedural risks, benefits and alternatives. All questions were addressed. Maximal Sterile Barrier Technique was utilized including caps, mask, sterile gowns, sterile gloves, sterile drape, hand hygiene and skin antiseptic. A timeout was performed prior to the initiation of the procedure. Patient was positioned supine position on the CT gantry table. Scout CT acquired for planning purposes. We first addressed the right lower quadrant fluid. The patient was then prepped and draped in the usual sterile fashion. 1% lidocaine was used for local anesthesia. Using CT guidance, a trocar needle was advanced into the small fluid and gas collection of the right lower quadrant, lateral to the psoas muscle. Once we confirmed needle tip position, we aspirated approximately 5 cc of purulent fluid. A drain was not placed given the small size of this fluid collection. Sterile dressing was placed.  Sample was sent for culture. We then address the left lower quadrant fluid. The patient was repositioned into left anterior oblique position. Scout CT acquired for planning purposes. The patient was then prepped and draped in the usual sterile fashion. 1% lidocaine was used for local anesthesia. Using CT guidance, trocar needle was advanced into the fluid collection of the left lower quadrant. Using modified Seldinger technique, a 10 French drain was placed into the collection. Approximately 20 cc of frankly purulent material was aspirated. Sample sent for culture. The drain was sutured in position and attached to bulb drainage. Final CT was acquired. Patient tolerated the procedure well and remained hemodynamically stable throughout. No complications were encountered and no significant blood loss. IMPRESSION: Status post CT-guided aspiration of right lower quadrant  fluid and drainage of left lower quadrant fluid. Signed, Dulcy Fanny. Nadene Rubins, RPVI [facility] Radiology Electronically Signed   By: Corrie Mckusick D.O.   On: 03/15/2022 14:57   DG Chest 2 View  Result Date: 03/13/2022 CLINICAL DATA:  Suspected sepsis EXAM: CHEST - 2 VIEW COMPARISON:  Chest x-ray February 17, 2022 FINDINGS: Stable cardiomediastinal contours. Unchanged calcified atherosclerosis of the aortic arch. Both lungs are clear. The visualized skeletal structures are unremarkable. IMPRESSION: No acute cardiopulmonary abnormality. Electronically Signed   By: Beryle Flock M.D.   On: 03/13/2022 10:56   CT ABDOMEN PELVIS W CONTRAST  Result Date: 03/11/2022 CLINICAL DATA:  Abdominal pain with persistent leakage from ileostomy site status post partial colectomy and ileostomy 01/20/2022. History of lung cancer. EXAM: CT ABDOMEN AND PELVIS WITH CONTRAST TECHNIQUE: Multidetector CT imaging of the abdomen and pelvis was performed using the  standard protocol following bolus administration of intravenous contrast. RADIATION DOSE REDUCTION: This exam was performed according to the departmental dose-optimization program which includes automated exposure control, adjustment of the mA and/or kV according to patient size and/or use of iterative reconstruction technique. CONTRAST:  136mL OMNIPAQUE IOHEXOL 300 MG/ML  SOLN COMPARISON:  Abdominopelvic CT 03/04/2022 and 02/17/2022. FINDINGS: Lower chest: Stable appearance of the lung bases, without acute findings. No significant pleural effusion. Hepatobiliary: The liver is normal in density without suspicious focal abnormality. No evidence of gallstones, gallbladder wall thickening or biliary dilatation. Pancreas: Unremarkable. No pancreatic ductal dilatation or surrounding inflammatory changes. Spleen: Normal in size without focal abnormality. Adrenals/Urinary Tract: Both adrenal glands appear normal. No evidence  of urinary tract calculus, suspicious renal lesion or hydronephrosis. The bladder appears unremarkable for its degree of distention. Stomach/Bowel: Enteric contrast was administered and has passed through the right lower quadrant ileostomy. The stomach appears unremarkable for its degree of distention. No small bowel wall thickening or surrounding inflammation. Status post subtotal colectomy with stable appearance of the [facility] pouch. Vascular/Lymphatic: Extensive nonocclusive bilateral femoral vein deep venous thrombosis again noted, similar to the most recent study. No significant iliac vein extension. The IVC is patent. Aortic and branch vessel atherosclerosis without evidence of aneurysm. There are no enlarged abdominopelvic lymph nodes. Reproductive: Hysterectomy.  No adnexal mass. Other: Bilateral percutaneous pelvic drains have been removed in the interval. There is a small recurrent air-fluid collection on the right anterior to the iliacus muscle, measuring 2.8 x 2.9 cm transverse on image 57/2 and up to 5.5 cm on sagittal image 48/6. There is no contrast material within this collection. There is a larger left-sided air-fluid collection which measures up to 5.3 x 2.2 cm on axial image 62/2 and extends at least 7.9 cm on coronal image 50/5, extending inferiorly to the vaginal cuff. There is no contrast material within this collection. A small amount of gas remains within the midline abdominal incision without definite enteric contrast. Superficial enteric contrast at the umbilicus appears spilled from the ileostomy. No free air. No ascites. Musculoskeletal: No acute or significant osseous findings. Multilevel thoracolumbar spondylosis. Degenerative changes of both hips. Diffuse pelvic muscular atrophy. IMPRESSION: 1. Interval removal of bilateral percutaneous pelvic drains. There are small recurrent air-fluid collections in both sides of the pelvis which do not contain contrast material, suspicious for small  abscesses. No free air or ascites. 2. No evidence of bowel obstruction or ileus. 3. 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 4. 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 5.  Aortic Atherosclerosis (ICD10-I70.0). 6. These results will be called to the ordering clinician or representative by the Radiologist Assistant, and communication documented in the PACS or [employer]. Electronically Signed   By: Richardean Sale M.D.   On: 03/11/2022 14:33   US Venous Img Lower Bilateral (DVT)  Result Date: 03/04/2022 CLINICAL DATA:  81 year old female with common femoral DVT identified on CTA earlier today. EXAM: BILATERAL LOWER EXTREMITY VENOUS DOPPLER ULTRASOUND TECHNIQUE: Gray-scale sonography with compression, as well as color and duplex ultrasound, were performed to evaluate the deep venous system(s) from the level of the common femoral vein through the popliteal and proximal calf veins. COMPARISON:  None Available. FINDINGS: VENOUS Nonocclusive thrombus within the RIGHT common femoral vein noted as well as occlusive thrombus within the RIGHT femoral, popliteal and calf veins. Nonocclusive thrombus within the common femoral, femoral and posterior tibial veins are noted. OTHER None. Limitations: none IMPRESSION: Bilateral LOWER  extremity DVT involving bilateral common femoral, femoral, RIGHT popliteal and calf veins. Occlusive thrombus is noted within the RIGHT femoral, popliteal and calf veins. Clinical team is aware of LOWER extremity DVT from CT earlier today. This report will be called to the referring clinician by sonography staff. Electronically Signed   By: Margarette Canada M.D.   On: 03/04/2022 17:24   IR Radiologist Eval & Mgmt  Result Date: 03/04/2022 EXAM: ESTABLISHED PATIENT OFFICE VISIT CHIEF COMPLAINT: See epic note. HISTORY OF PRESENT ILLNESS: See epic note. REVIEW OF SYSTEMS: See epic note. PHYSICAL EXAMINATION: See epic note. ASSESSMENT AND PLAN:  See epic note. Ruthann Cancer, MD [facility] Inter[facility] Radiology Specialists [facility] Radiology Electronically Signed   By: Ruthann Cancer M.D.   On: 03/04/2022 09:57   DG Sinus/Fist Tube Chk-Non GI  Result Date: 03/04/2022 CLINICAL DATA:  81 year old female with history of subtotal colectomy and end ileostomy complicated by intra-abdominal fluid collection status post percutaneous drain placement on 02/18/2022. The patient presents to drain clinic with trace output from the drains and CT evidence of resolution of previously visualized fluid collections. EXAM: ABSCESS INJECTION COMPARISON:  None Available. CONTRAST:  10 mL Omnipaque 300-administered via the existing percutaneous drain. FLUOROSCOPY TIME:  1.5 mGy TECHNIQUE: The patient was positioned supine on the fluoroscopy table. A preprocedural spot fluoroscopic image was obtained of the pelvis and the existing percutaneous drainage catheters. Multiple spot fluoroscopic and radiographic images were obtained following the injection of a small amount of contrast via the existing percutaneous drainage catheters. The external portion of the drains were cut to release inter pigtails. The retention sutures were removed. The drains were removed successfully without complication. FINDINGS: Resolution of previously visualized fluid collections. No evidence of enteric fistula. The drains were removed successfully. IMPRESSION: Resolution of previously visualized fluid collections without evidence of enteric fistula. The drains were removed successfully. Ruthann Cancer, MD [facility] Inter[facility]iology Special[facility] Radiology Electronically Signed   By: Ruthann Cancer M.D.   On: 03/04/2022 09:25   CT ABDOMEN PELVIS W CONTRAST  Result Date: 03/04/2022 CLINICAL DATA:  81 year old female with history of lower abdominal abscesses status post percutaneous drain placement on 02/18/2022. The patient presents with minimal output from both drains. EXAM: CT  ABDOMEN AND PELVIS WITH CONTRAST TECHNIQUE: Multidetector CT imaging of the abdomen and pelvis was performed using the standard protocol following bolus administration of intravenous contrast. RADIATION DOSE REDUCTION: This exam was performed according to the departmental dose-optimization program which includes automated exposure control, adjustment of the mA and/or kV according to patient size and/or use of iterative reconstruction technique. CONTRAST:  164mL ISOVUE-300 IOPAMIDOL (ISOVUE-300) INJECTION 61% COMPARISON:  02/17/2022, 02/18/2022 FINDINGS: Lower chest: No acute abnormality. Hepatobiliary: No focal liver abnormality is seen. No gallstones, gallbladder wall thickening, or biliary dilatation. Pancreas: Unremarkable. No pancreatic ductal dilatation or surrounding inflammatory changes. Spleen: Normal in size without focal abnormality. Adrenals/Urinary Tract: Adrenal glands are unremarkable. Kidneys are normal, without renal calculi, focal lesion, or hydronephrosis. Bladder is unremarkable. Stomach/Bowel: Stomach is within normal limits. Similar appearing postsurgical changes after partial colectomy. 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 No evidence of bowel wall thickening, distention, or inflammatory changes. Vascular/Lymphatic: Nonocclusive deep vein thrombosis extending to the central aspect of the common femoral veins bilaterally. No evidence of significant iliac extension or evidence of ileo caval thrombosis. Similar-appearing atherosclerotic calcifications are noted peer no abdominopelvic lymphadenopathy. Reproductive: Status post hysterectomy. No adnexal masses. Other: Significant interval healing of previously visualized incisional hernia  and left lower quadrant laparoscopy port site. Musculoskeletal: Unchanged multilevel degenerative changes of the visualized thoracolumbar spine. No acute osseous abnormality. IMPRESSION: 1. Nonocclusive bilateral femoral deep  vein thrombosis. No evidence of iliocaval extension. Recommend bilateral lower extremity venous ultrasound for further characterization. 2. Interval resolution of previously visualized bilateral lower abdominal fluid collections with unchanged position of the indwelling drains. 3. Postsurgical changes after subtotal colectomy and right lower quadrant in ileostomy without complicating features. These results will be called to the ordering clinician or representative by the Radiologist Assistant, and communication documented in the PACS or [employer]. Ruthann Cancer, MD [facility] Inter[facility]iology Special[facility] Radiology Electronically Signed   By: Ruthann Cancer M.D.   On: 03/04/2022 09:08   CT GUIDED PERITONEAL/RETROPERITONEAL FLUID DRAIN BY PERC CATH  Result Date: 02/18/2022 INDICATION: 81 year old with recent abdominal surgeries including subtotal colectomy with an end ileostomy. Recent CT demonstrates intra-abdominal abscesses. Plan for CT-guided drainage of the intra-abdominal abscesses. EXAM: CT-guided drain placement in right lower abdominal/pelvic abscess CT-guided drain placement in left lower abdominal/pelvic abscess. MEDICATIONS: Moderate sedation ANESTHESIA/SEDATION: Moderate (conscious) sedation was employed during this procedure. A total of Versed 1.$RemoveBef'0mg'PlhbIfUocK$  and fentanyl 75 mcg was administered intravenously at the order of the provider performing the procedure. Total intra-service moderate sedation time: 32 minutes. Patient's level of consciousness and vital signs were monitored continuously by radiology nurse throughout the procedure under the supervision of the provider performing the procedure. COMPLICATIONS: None immediate. PROCEDURE: Informed written consent was obtained from the patient after a thorough discussion of the procedural risks, benefits and alternatives. All questions were addressed. Maximal Sterile Barrier Technique was utilized including caps, mask, sterile  gowns, sterile gloves, sterile drape, hand hygiene and skin antiseptic. A timeout was performed prior to the initiation of the procedure. Patient was placed supine on the CT scanner. Images through the lower abdomen and pelvis were obtained. Abscess collections situated between the right psoas and right iliacus muscle targeted. The right side of the lower abdomen was prepped with chlorhexidine and sterile field was created. Skin was anesthetized with 1% lidocaine. A small incision was made. Using CT guidance, an 18 gauge trocar needle was directed into the collection adjacent to the right psoas muscle. Superstiff Amplatz wire was placed but the wire went into smaller collection anterior to the larger abscess collection. As a result, this wire and needle were removed. A new incision was made in order to target the larger abscess collection. Using CT guidance, the 18 gauge needle was directed into the abscess and yellow purulent fluid was aspirated. Superstiff Amplatz wire was placed. Tract was dilated to accommodate a 10 [place] multipurpose drain and approximately 20 mL of purulent fluid was removed. Drain was attached to a suction bulb and sutured to skin. Attention was directed to the abscess in the left lower abdomen. Left lower abdomen was prepped with chlorhexidine and sterile field was created. Skin was anesthetized using 1% lidocaine. Small incision was made. Using CT guidance, an 18 gauge trocar needle was directed into the air-fluid collection and purulent fluid was aspirated. Superstiff Amplatz wire was placed and the tract was dilated to accommodate a 10 [place] multipurpose drain. 20 mL of thick yellow purulent fluid was removed. Follow up CT images were obtained. Drain was attached to a suction bulb and sutured to skin. Fluid samples were sent for culture. RADIATION DOSE REDUCTION: This exam was performed according to the departmental dose-optimization program which includes automated exposure control,  adjustment of the mA and/or kV  according to patient size and/or use of iterative reconstruction technique. FINDINGS: Multiple air-fluid collections in the right lower abdomen. Largest abscess was situated between the right psoas muscle and right iliacus muscle. Purulent fluid was removed from the largest collection. Drain was placed in the largest abscess collection. Drain was successfully placed in the air-fluid collection in the left lower abdomen adjacent to the left psoas muscle. 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 IMPRESSION: Successful placement of a CT-guided drain in the left lower abdominal abscess and CT-guided placement of a drain in the right lower abdominal abscess. Electronically Signed   By: Markus Daft M.D.   On: 02/18/2022 21:23   CT GUIDED PERITONEAL/RETROPERITONEAL FLUID DRAIN BY PERC CATH  Result Date: 02/18/2022 INDICATION: 81 year old with recent abdominal surgeries including subtotal colectomy with an end ileostomy. Recent CT demonstrates intra-abdominal abscesses. Plan for CT-guided drainage of the intra-abdominal abscesses. EXAM: CT-guided drain placement in right lower abdominal/pelvic abscess CT-guided drain placement in left lower abdominal/pelvic abscess. MEDICATIONS: Moderate sedation ANESTHESIA/SEDATION: Moderate (conscious) sedation was employed during this procedure. A total of Versed 1.$RemoveBef'0mg'JqzqixLQkO$  and fentanyl 75 mcg was administered intravenously at the order of the provider performing the procedure. Total intra-service moderate sedation time: 32 minutes. Patient's level of consciousness and vital signs were monitored continuously by radiology nurse throughout the procedure under the supervision of the provider performing the procedure. COMPLICATIONS: None immediate. PROCEDURE: Informed written  consent was obtained from the patient after a thorough discussion of the procedural risks, benefits and alternatives. All questions were addressed. Maximal Sterile Barrier Technique was utilized including caps, mask, sterile gowns, sterile gloves, sterile drape, hand hygiene and skin antiseptic. A timeout was performed prior to the initiation of the procedure. Patient was placed supine on the CT scanner. Images through the lower abdomen and pelvis were obtained. Abscess collections situated between the right psoas and right iliacus muscle targeted. The right side of the lower abdomen was prepped with chlorhexidine and sterile field was created. Skin was anesthetized with 1% lidocaine. A small incision was made. Using CT guidance, an 18 gauge trocar needle was directed into the collection adjacent to the right psoas muscle. Superstiff Amplatz wire was placed but the wire went into smaller collection anterior to the larger abscess collection. As a result, this wire and needle were removed. A new incision was made in order to target the larger abscess collection. Using CT guidance, the 18 gauge needle was directed into the abscess and yellow purulent fluid was aspirated. Superstiff Amplatz wire was placed. Tract was dilated to accommodate a 10 [place] multipurpose drain and approximately 20 mL of purulent fluid was removed. Drain was attached to a suction bulb and sutured to skin. Attention was directed to the abscess in the left lower abdomen. Left lower abdomen was prepped with chlorhexidine and sterile field was created. Skin was anesthetized using 1% lidocaine. Small incision was made. Using CT guidance, an 18 gauge trocar needle was directed into the air-fluid collection and purulent fluid was aspirated. Superstiff Amplatz wire was placed and the tract was dilated to accommodate a 10 [place] multipurpose drain. 20 mL of thick yellow purulent fluid was removed. Follow up CT images were obtained. Drain was attached to  a suction bulb and sutured to skin. Fluid samples were sent for culture. RADIATION DOSE REDUCTION: This exam was performed according to  the departmental dose-optimization program which includes automated exposure control, adjustment of the mA and/or kV according to patient size and/or use of iterative reconstruction technique. FINDINGS: Multiple air-fluid collections in the right lower abdomen. Largest abscess was situated between the right psoas muscle and right iliacus muscle. Purulent fluid was removed from the largest collection. Drain was placed in the largest abscess collection. Drain was successfully placed in the air-fluid collection in the left lower abdomen adjacent to the left psoas muscle. 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 IMPRESSION: Successful placement of a CT-guided drain in the left lower abdominal abscess and CT-guided placement of a drain in the right lower abdominal abscess. Electronically Signed   By: Markus Daft M.D.   On: 02/18/2022 21:23   CT Abdomen Pelvis W Contrast  Result Date: 02/17/2022 CLINICAL DATA:  Sigmoid hemicolectomy 01/20/2022. Repair of cecal perforation. Loop ileostomy creation. Drains removed last week. Abdominal pain. Elevated white blood cell count. EXAM: CT ABDOMEN AND PELVIS WITH CONTRAST TECHNIQUE: Multidetector CT imaging of the abdomen and pelvis was performed using the standard protocol following bolus administration of intravenous contrast. RADIATION DOSE REDUCTION: This exam was performed according to the departmental dose-optimization program which includes automated exposure control, adjustment of the mA and/or kV according to patient size and/or use of iterative reconstruction technique. CONTRAST:  118mL OMNIPAQUE IOHEXOL 300 MG/ML  SOLN COMPARISON:  02/01/2022 FINDINGS:  Lower chest: Motion degradation within the lower chest. Tiny bibasilar pulmonary nodules are most likely incidental/benign and do not warrant imaging follow-up per consensus criteria. Example at maximally 3 mm.Mild cardiomegaly, without pericardial or pleural effusion. Hepatobiliary: Mild motion degradation continuing into the upper abdomen. Normal liver. The gallbladder is underdistended, without calcified stone or specific evidence of acute cholecystitis. Pancreas: Normal, without mass or ductal dilatation. Spleen: Normal in size, without focal abnormality. Adrenals/Urinary Tract: Normal adrenal glands. Normal kidneys, without hydronephrosis. Normal urinary bladder. Stomach/Bowel: Normal stomach, without wall thickening. Interval Hartmann's pouch creation and new complete colectomy. Right lower quadrant ileostomy again identified. Ill-defined right pelvic fluid and gas of 6.0 x 5.1 cm on 57/2 is contiguous with the rectal stump, new since 01/24/2022. ill-defined left pelvic fluid and gas collection measures 6.7 x 4.3 cm on 57/2, also likely tracking from the rectal stump including on 63/2. Extends to the left adnexa and superior aspect of the left side of the vaginal cuff including on 66/2. More ill-defined gas within the ileocolic mesentery including on 50/2. Vascular/Lymphatic: Aortic atherosclerosis. 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 No abdominopelvic adenopathy. Reproductive: Hysterectomy.  No adnexal mass. Other: No free pelvic fluid. Pelvic midline laparotomy included on 66/2. Hyperattenuation material within could represent contrast. Gas just deep to the laparotomy site on 63/2 is intimately associated with the adjacent bowel loops. No well-defined fluid collection. There is also midline abdominal wall presumably laparotomy induced gas on 49/2. Musculoskeletal: No acute osseous abnormality. Grade 1 L3-4 anterolisthesis with advanced lumbosacral spondylosis. IMPRESSION: 1. Minimally motion degraded  exam. 2. Interval near complete colectomy with rectal stump creation. Bilateral ill-defined gas and fluid collections within the pelvis, most consistent with abscesses. These track from the rectal stump site, and anastomotic leak cannot be excluded. 3. Abdominopelvic laparotomy sites. Gas deep to pelvic surgical sutures is intimately associated with adjacent bowel and fistulous communication cannot be excluded (especially given hyperattenuating material within the  defect, possibly contrast). 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 Electronically Signed   By: Abigail Miyamoto M.D.   On: 02/17/2022 18:08   DG Chest 2 View  Result Date: 02/17/2022 CLINICAL DATA:  Suspected sepsis EXAM: CHEST - 2 VIEW COMPARISON:  11/29/2020 FINDINGS: Linear atelectasis in the right upper lobe. No confluent airspace opacities otherwise. Heart is normal size. Mediastinal contours within normal limits. Aortic atherosclerosis. No effusions or acute bony abnormality. IMPRESSION: Platelike right upper lobe atelectasis. No active disease. Electronically Signed   By: Rolm Baptise M.D.   On: 02/17/2022 17:09    Microbiology: Results for orders placed or performed during the hospital encounter of 03/13/22  Culture, blood (Routine x 2)     Status: None   Collection Time: 03/13/22  1:46 PM   Specimen: BLOOD  Result Value Ref Range Status   Specimen Description BLOOD LEFT ARM  Final   Special Requests   Final    BOTTLES DRAWN AEROBIC AND ANAEROBIC Blood Culture adequate volume   Culture   Final    NO GROWTH 5 DAYS Performed at [facility], [address] [place], [place] [address]    Report Status 03/18/2022 FINAL  Final  Culture, blood (Routine x 2)     Status: None   Collection Time: 03/13/22  1:46 PM   Specimen: BLOOD  Result Value Ref Range Status   Specimen Description BLOOD RIGHT ARM  Final   Special Requests   Final    BOTTLES DRAWN AEROBIC AND ANAEROBIC Blood Culture adequate volume   Culture   Final    NO  GROWTH 5 DAYS Performed at [facility], [address], [place], [place] [address]    Report Status 03/18/2022 FINAL  Final  Aerobic/Anaerobic Culture w Gram Stain (surgical/deep wound)     Status: None (Preliminary result)   Collection Time: 03/15/22  1:33 PM   Specimen: Abscess  Result Value Ref Range Status   Specimen Description   Final    ABSCESS ABDOMEN Performed at [facility], [address], [place], [place] [address]    Special Requests   Final    NONE Performed at [facility], [address] [place], [place] [address]    Gram Stain   Final    ABUNDANT GRAM POSITIVE COCCI IN CHAINS MODERATE WBC PRESENT, PREDOMINANTLY PMN Performed at [facility], [place] [address] [place], [place] [address]    Culture   Final    ABUNDANT STREPTOCOCCUS ANGINOSIS RARE STAPHYLOCOCCUS AUREUS SUSCEPTIBILITIES TO FOLLOW NO ANAEROBES ISOLATED; CULTURE IN PROGRESS FOR 5 DAYS    Report Status PENDING  Incomplete   Organism ID, Bacteria STREPTOCOCCUS ANGINOSIS  Final      Susceptibility   Streptococcus anginosis - MIC*    PENICILLIN <=0.06 SENSITIVE Sensitive     CEFTRIAXONE 0.5 SENSITIVE Sensitive     ERYTHROMYCIN <=0.12 SENSITIVE Sensitive     LEVOFLOXACIN 1 SENSITIVE Sensitive     VANCOMYCIN 0.5 SENSITIVE Sensitive     * ABUNDANT STREPTOCOCCUS ANGINOSIS  Aerobic/Anaerobic Culture w Gram Stain (surgical/deep wound)     Status: None (Preliminary result)   Collection Time: 03/15/22  1:33 PM   Specimen: Abdomen; Abscess  Result Value Ref Range Status   Specimen Description   Final    ABDOMEN ABSCESS Performed at [facility], [address], [place], [place] [address]    Special Requests   Final    SYRINGE 2 Performed at [facility], [address] [place], [place] [address]    Gram Stain   Final    ABUNDANT WBC PRESENT, PREDOMINANTLY PMN ABUNDANT GRAM POSITIVE COCCI IN CHAINS    Culture   Final    ABUNDANT STREPTOCOCCUS  ANGINOSIS CULTURE REINCUBATED FOR BETTER GROWTH ENTEROCOCCUS FAECALIS SUSCEPTIBILITIES TO FOLLOW Performed at [facility], [place] [address] [place], [place] [address]    Report Status PENDING  Incomplete    Labs: CBC: Recent Labs  Lab 03/13/22 1032 03/14/22 0514 03/15/22 0521 03/16/22 0521 03/17/22 0401  WBC 8.5 8.7 10.5 11.0* 10.9*  NEUTROABS 6.4  --   --   --   --   HGB 9.2* 8.5* 7.6* 7.9* 7.3*  HCT 28.7* 26.7* 23.3* 23.9* 22.3*  MCV 87.8 87.5 86.0 84.8 84.5  PLT 311 285 313 333 056   Basic Metabolic Panel: Recent Labs  Lab 03/13/22 1032 03/13/22 1540 03/14/22 0514 03/15/22 0521 03/16/22 0521 03/17/22 0401  NA 130*  --  131* 130* 130* 130*  K 3.9  --  4.3 4.0 3.5 3.4*  CL 103  --  108 109 105 104  CO2 18*  --  17* 17* 19* 21*  GLUCOSE 89  --  111* 117* 108* 113*  BUN 19  --  14 8 <5* <5*  CREATININE 1.00  --  0.67 0.61 0.41* 0.48  CALCIUM 8.4*  --  8.0* 8.4* 8.4* 8.1*  MG  --  1.0* 1.6*  --  1.4* 2.2  PHOS  --  3.4  --   --   --   --    Liver Function Tests: Recent Labs  Lab 03/13/22 1032  AST 13*  ALT 8  ALKPHOS 88  BILITOT 0.4  PROT 6.7  ALBUMIN 2.9*   CBG: No results for input(s): "GLUCAP" in the last 168 hours.  Discharge time spent: greater than 30 minutes.  Signed: Annita Brod, MD [facility] 03/18/2022

## 2022-03-19 ENCOUNTER — Other Ambulatory Visit: Payer: Self-pay | Admitting: Physician Assistant

## 2022-03-19 DIAGNOSIS — L0291 Cutaneous abscess, unspecified: Secondary | ICD-10-CM

## 2022-03-19 DIAGNOSIS — K651 Peritoneal abscess: Secondary | ICD-10-CM

## 2022-03-20 LAB — AEROBIC/ANAEROBIC CULTURE W GRAM STAIN (SURGICAL/DEEP WOUND)

## 2022-03-21 LAB — AEROBIC/ANAEROBIC CULTURE W GRAM STAIN (SURGICAL/DEEP WOUND)

## 2022-03-22 ENCOUNTER — Other Ambulatory Visit: Payer: Self-pay | Admitting: General Surgery

## 2022-03-22 DIAGNOSIS — K651 Peritoneal abscess: Secondary | ICD-10-CM

## 2022-03-24 DIAGNOSIS — K5732 Diverticulitis of large intestine without perforation or abscess without bleeding: Secondary | ICD-10-CM | POA: Diagnosis not present

## 2022-03-24 DIAGNOSIS — G4733 Obstructive sleep apnea (adult) (pediatric): Secondary | ICD-10-CM | POA: Diagnosis not present

## 2022-03-24 DIAGNOSIS — J439 Emphysema, unspecified: Secondary | ICD-10-CM | POA: Diagnosis not present

## 2022-03-24 DIAGNOSIS — K631 Perforation of intestine (nontraumatic): Secondary | ICD-10-CM | POA: Diagnosis not present

## 2022-03-24 DIAGNOSIS — D649 Anemia, unspecified: Secondary | ICD-10-CM | POA: Diagnosis not present

## 2022-03-24 DIAGNOSIS — K56609 Unspecified intestinal obstruction, unspecified as to partial versus complete obstruction: Secondary | ICD-10-CM | POA: Diagnosis not present

## 2022-03-24 DIAGNOSIS — Z932 Ileostomy status: Secondary | ICD-10-CM | POA: Diagnosis not present

## 2022-03-24 DIAGNOSIS — J45909 Unspecified asthma, uncomplicated: Secondary | ICD-10-CM | POA: Diagnosis not present

## 2022-03-24 DIAGNOSIS — A419 Sepsis, unspecified organism: Secondary | ICD-10-CM | POA: Diagnosis not present

## 2022-03-24 DIAGNOSIS — I890 Lymphedema, not elsewhere classified: Secondary | ICD-10-CM | POA: Diagnosis not present

## 2022-03-24 DIAGNOSIS — M170 Bilateral primary osteoarthritis of knee: Secondary | ICD-10-CM | POA: Diagnosis not present

## 2022-03-24 DIAGNOSIS — T8143XA Infection following a procedure, organ and space surgical site, initial encounter: Secondary | ICD-10-CM | POA: Diagnosis not present

## 2022-03-24 DIAGNOSIS — G43909 Migraine, unspecified, not intractable, without status migrainosus: Secondary | ICD-10-CM | POA: Diagnosis not present

## 2022-03-26 DIAGNOSIS — D649 Anemia, unspecified: Secondary | ICD-10-CM | POA: Diagnosis not present

## 2022-03-26 DIAGNOSIS — Z79899 Other long term (current) drug therapy: Secondary | ICD-10-CM | POA: Diagnosis not present

## 2022-03-26 DIAGNOSIS — K651 Peritoneal abscess: Secondary | ICD-10-CM | POA: Diagnosis not present

## 2022-03-26 DIAGNOSIS — Z933 Colostomy status: Secondary | ICD-10-CM | POA: Diagnosis not present

## 2022-04-02 DIAGNOSIS — Z932 Ileostomy status: Secondary | ICD-10-CM | POA: Diagnosis not present

## 2022-04-02 DIAGNOSIS — K56609 Unspecified intestinal obstruction, unspecified as to partial versus complete obstruction: Secondary | ICD-10-CM | POA: Diagnosis not present

## 2022-04-02 DIAGNOSIS — K5732 Diverticulitis of large intestine without perforation or abscess without bleeding: Secondary | ICD-10-CM | POA: Diagnosis not present

## 2022-04-02 DIAGNOSIS — K631 Perforation of intestine (nontraumatic): Secondary | ICD-10-CM | POA: Diagnosis not present

## 2022-04-05 ENCOUNTER — Encounter (INDEPENDENT_AMBULATORY_CARE_PROVIDER_SITE_OTHER): Payer: Self-pay

## 2022-04-06 ENCOUNTER — Other Ambulatory Visit: Payer: Medicare HMO

## 2022-04-06 DIAGNOSIS — Z79899 Other long term (current) drug therapy: Secondary | ICD-10-CM | POA: Diagnosis not present

## 2022-04-06 DIAGNOSIS — L02211 Cutaneous abscess of abdominal wall: Secondary | ICD-10-CM | POA: Diagnosis not present

## 2022-04-06 DIAGNOSIS — Z933 Colostomy status: Secondary | ICD-10-CM | POA: Diagnosis not present

## 2022-04-07 ENCOUNTER — Other Ambulatory Visit: Payer: Self-pay | Admitting: General Surgery

## 2022-04-07 DIAGNOSIS — K651 Peritoneal abscess: Secondary | ICD-10-CM

## 2022-04-09 ENCOUNTER — Ambulatory Visit
Admission: RE | Admit: 2022-04-09 | Discharge: 2022-04-09 | Disposition: A | Discharge status: Home / Self Care | Payer: Medicare HMO | Source: Ambulatory Visit | Attending: General Surgery | Admitting: General Surgery

## 2022-04-09 DIAGNOSIS — I700 Atherosclerosis of aorta: Secondary | ICD-10-CM | POA: Diagnosis not present

## 2022-04-09 DIAGNOSIS — K651 Peritoneal abscess: Secondary | ICD-10-CM | POA: Insufficient documentation

## 2022-04-09 DIAGNOSIS — N730 Acute parametritis and pelvic cellulitis: Secondary | ICD-10-CM | POA: Diagnosis not present

## 2022-04-09 DIAGNOSIS — Z9889 Other specified postprocedural states: Secondary | ICD-10-CM | POA: Diagnosis not present

## 2022-04-09 MED ORDER — IOHEXOL 300 MG/ML  SOLN
100.0000 mL | Freq: Once | INTRAMUSCULAR | Status: AC | PRN
  Administered 2022-04-09: 100 mL via INTRAVENOUS

## 2022-04-09 NOTE — Progress Notes (Signed)
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 Drains were removed in clinic on 9/28 with imaging showing resolution of lower abdominal abscesses. Further imaging on 03/11/22 showed recurrence of abdominal fluid collections, and left lower quadrant abdominal drain was placed 10/9, with an aspiration but no drain placement on the right lower quadrant abdominal fluid collection. The patient presents today for drain follow-up. She states that her LLQ drain has had little to no output for several days now. She denies fever, chills, pain at the insertion site, or leakage from the drain site. She does endorse leakage of fluid from her surgical site from Dr Peyton Najjar.  Past Medical History:  Diagnosis Date   Anxiety    Arthritis    Asthma    DVT (deep venous thrombosis) (HCC)    Edema    feet   Emphysema of lung (HCC)    GERD (gastroesophageal reflux disease)    Headache    MIGRAINES   History of orthopnea    Hypercholesterolemia    Hypothyroidism    NODULES   Lymphedema    Migraine    Non-small cell lung cancer, right ([place]) 05/2019   Rad tx's   Shortness of breath dyspnea    WITH EXERTION   Sleep apnea    MILD, DOES NOT USE CPAP   Thyroid nodule     Past Surgical History:  Procedure Laterality Date   ABDOMINAL HYSTERECTOMY     BREAST BIOPSY     CARDIAC CATHETERIZATION     CATARACT EXTRACTION W/PHACO Left 02/12/2016   Procedure: CATARACT EXTRACTION PHACO AND INTRAOCULAR LENS PLACEMENT ([place]);  Surgeon: Eulogio Bear, MD;  Location: [facility];  Service: Ophthalmology;  Laterality: Left;  [place]  01:30 AP% 15.2 CDE 13.71 Fluid pack lot # [id number]   CATARACT EXTRACTION W/PHACO Right 03/18/2016   Procedure: CATARACT EXTRACTION PHACO AND INTRAOCULAR LENS PLACEMENT ([facility]);  Surgeon: Eulogio Bear, MD;  Location: [facility];  Service: Ophthalmology;  Laterality: Right;  Lot # [id number] [place]: 01:05.5 AP%:14.3 CDE: 9.33   COLECTOMY WITH COLOSTOMY CREATION/HARTMANN PROCEDURE N/A 01/20/2022   Procedure: COLECTOMY WITH COLOSTOMY CREATION/HARTMANN PROCEDURE;  Surgeon: Herbert Pun, MD;  Location: [facility];  Service: General;  Laterality: N/A;   FLEXIBLE SIGMOIDOSCOPY N/A 01/14/2022   Procedure: FLEXIBLE SIGMOIDOSCOPY;  Surgeon: Toledo, Benay Pike, MD;  Location: [facility];  Service: Gastroenterology;  Laterality: N/A;   FLEXIBLE SIGMOIDOSCOPY N/A 01/19/2022   Procedure: FLEXIBLE SIGMOIDOSCOPY;  Surgeon: Lesly Rubenstein, MD;  Location: [facility];  Service: Endoscopy;  Laterality: N/A;   FRACTURE SURGERY Left    arm rod and screw   IR RADIOLOGIST EVAL & MGMT  03/04/2022   KNEE ARTHROSCOPY     LAPAROTOMY N/A 01/24/2022   Procedure: EXPLORATORY LAPAROTOMY;  Surgeon: Herbert Pun, MD;  Location: [facility];  Service: General;  Laterality: N/A;   OOPHORECTOMY     RCR     ROTATOR CUFF REPAIR Left 2006    Allergies: Eryc [erythromycin], Levofloxacin, Percocet [oxycodone-acetaminophen], Augmentin [amoxicillin-pot clavulanate], and Celecoxib  Medications: Prior to Admission medications   Medication Sig Start Date End Date Taking? Authorizing Provider  acetaminophen (TYLENOL) 500 MG tablet Take 1,000 mg  by mouth daily as needed for pain.    [provider]  apixaban (ELIQUIS) 5 MG TABS tablet Take 5 mg by mouth 2 (two) times daily.    [provider]  ascorbic acid (VITAMIN C) 500 MG tablet Take 1 tablet (500 mg total) by mouth 2 (two) times daily. 03/17/22   Annita Brod, MD  aspirin EC 81 MG tablet Take 81 mg by mouth daily.    [provider]  budesonide-formoterol (SYMBICORT) 160-4.5 MCG/ACT inhaler Inhale 2 puffs into the lungs 2 (two) times daily.    [provider]  calcium carbonate (CALCIUM ANTACID) 500 MG chewable tablet Chew 2 tablets by mouth daily.    [provider]  Cholecalciferol (VITAMIN D PO) Take 5,000 Units by mouth daily.    [provider]  Cyanocobalamin (VITAMIN B-12 PO) Take 1,000 mcg by mouth daily.    [provider]  feeding supplement (ENSURE ENLIVE / ENSURE PLUS) LIQD Take 237 mLs by mouth 3 (three) times daily between meals. 01/30/22   [name], MD  fluticasone (FLONASE) 50 MCG/ACT nasal spray Place 2 sprays into both nostrils at bedtime.    [provider]  furosemide (LASIX) 20 MG tablet Take 20 mg by mouth daily. 10/10/17   [provider]  leptospermum manuka honey (MEDIHONEY) PSTE paste Apply Medihoney to sacrum wound Q day, then cover with foam dressing.  Change foam dressing Q 3 days or PRN soiling Apply thin layer (3 mm) to wound. 03/18/22   Annita Brod, MD  metoprolol tartrate (LOPRESSOR) 25 MG tablet Take 1 tablet (25 mg total) by mouth 2 (two) times daily. 02/20/22   Annita Brod, MD  montelukast (SINGULAIR) 10 MG tablet Take 10 mg by mouth at bedtime.     [provider]  Multiple Vitamins-Minerals (PRESERVISION AREDS PO) Take 1 capsule by mouth 2 (two) times daily.    [provider]  omeprazole (PRILOSEC) 20 MG capsule Take 20 mg by mouth 2 (two) times daily before a meal.    [provider]  Polyethyl Glycol-Propyl Glycol (SYSTANE) 0.4-0.3 % SOLN Apply 1 drop to eye 2 (two) times daily.    [provider]  Sodium Chloride Flush (SALINE FLUSH) 0.9 % SOLN Use as directed 03/17/22   Annita Brod, MD  traMADol (ULTRAM) 50 MG tablet Take 1 tablet (50 mg total) by mouth every 6 (six) hours as needed for moderate pain. 03/17/22   Annita Brod, MD     Family History   Problem Relation Age of Onset   Emphysema Mother    Heart Problems Mother    Tuberculosis Father    Brain cancer Father    Skin cancer Father    Breast cancer Sister    Irritable bowel syndrome Sister    Lung cancer Brother    Dementia Sister    Schizophrenia Sister     Social History   Socioeconomic History   Marital status: Married    Spouse name: Not on file   Number of children: Not on file   Years of education: Not on file   Highest education level: Not on file  Occupational History   Not on file  Tobacco Use   Smoking status: Never   Smokeless tobacco: Never  Substance and Sexual Activity   Alcohol use: No   Drug use: Never   Sexual activity: Not Currently  Other Topics Concern   Not on file  Social History Narrative  Not on file   Social Determinants of Health   Financial Resource Strain: Not on file  Food Insecurity: No Food Insecurity (03/15/2022)   Hunger Vital Sign    Worried About Running Out of Food in the Last Year: Never true    Ran Out of Food in the Last Year: Never true  Transportation Needs: No Transportation Needs (03/15/2022)   PRAPARE - Hydrologist (Medical): No    Lack of Transportation (Non-Medical): No  Physical Activity: Not on file  Stress: Not on file  Social Connections: Not on file     Vital Signs: There were no vitals taken for this visit.  Physical Exam Abdominal:     Tenderness: There is no abdominal tenderness.     Comments: LLQ abdominal drain is in place. JP bulb is charged but empty. Skin site is clean, dry, with no drainage or bleeding noted at the drain insertion site. Suture intact. Dressing was clean, dry, and intact.  Neurological:     Mental Status: She is alert.     Imaging: CT ABDOMEN PELVIS W CONTRAST  Result Date: 04/09/2022 CLINICAL DATA:  Pelvic abscess, status post left lower quadrant pelvic drain and right lower quadrant aspiration 03/15/2022 EXAM: CT ABDOMEN AND PELVIS  WITH CONTRAST TECHNIQUE: Multidetector CT imaging of the abdomen and pelvis was performed using the standard protocol following bolus administration of intravenous contrast. RADIATION DOSE REDUCTION: This exam was performed according to the departmental dose-optimization program which includes automated exposure control, adjustment of the mA and/or kV according to patient size and/or use of iterative reconstruction technique. CONTRAST:  135mL OMNIPAQUE IOHEXOL 300 MG/ML  SOLN COMPARISON:  03/15/2022, 03/11/2022 FINDINGS: Lower chest: No acute abnormality. Hepatobiliary: No focal liver abnormality is seen. No gallstones, gallbladder wall thickening, or biliary dilatation. Pancreas: Unremarkable. No pancreatic ductal dilatation or surrounding inflammatory changes. Spleen: Normal in size without focal abnormality. Adrenals/Urinary Tract: Adrenal glands are unremarkable. Kidneys are normal, without renal calculi, focal lesion, or hydronephrosis. Bladder is unremarkable. Stomach/Bowel: Negative for bowel obstruction, significant dilatation, ileus, or free air. Stable appearance of the bilateral ostomies. Postop changes of the anterior abdominal wall. Diastasis of the anterior abdominal wall in the midline with slight bulging but no definite hernia. Vascular/Lymphatic: Aortoiliac atherosclerosis without occlusive process or acute vascular finding. No aneurysm or dissection. Mesenteric and renal vasculature all remain patent. Reproductive: Status post hysterectomy. No adnexal masses. Other: Left lower quadrant pelvic abscess drain has retracted into the left pericolic gutter fat anteriorly. Left psoas abscess has not recurred. Small residual right psoas air-fluid collection with peripheral enhancement, largest component measures 2.7 cm, image 52/2. Musculoskeletal: Degenerative changes noted throughout the spine and pelvis. No acute osseous finding. IMPRESSION: Resolved left pelvic psoas abscess. Drain catheter at this  site has retracted and is now within the left pericolic gutter fat. This can be removed today. Stable residual right iliopsoas abscess, largest component measures only 2.7 cm. Stable chronic and postoperative findings as above. Electronically Signed   By: Jerilynn Mages.  Shick M.D.   On: 04/09/2022 14:17    Labs:  CBC: Recent Labs    03/14/22 0514 03/15/22 0521 03/16/22 0521 03/17/22 0401  WBC 8.7 10.5 11.0* 10.9*  HGB 8.5* 7.6* 7.9* 7.3*  HCT 26.7* 23.3* 23.9* 22.3*  PLT 285 313 333 334    COAGS: Recent Labs    01/18/22 2100 02/17/22 1630 02/18/22 0545 03/13/22 1346 03/13/22 1719 03/14/22 1504 03/14/22 2253 03/15/22 2339 03/16/22 0858  INR 1.2 1.3*  1.2 1.7*  --   --   --   --   --   APTT 29  --   --   --    < > 85* 93* 61* 90*   < > = values in this interval not displayed.    BMP: Recent Labs    03/14/22 0514 03/15/22 0521 03/16/22 0521 03/17/22 0401  NA 131* 130* 130* 130*  K 4.3 4.0 3.5 3.4*  CL 108 109 105 104  CO2 17* 17* 19* 21*  GLUCOSE 111* 117* 108* 113*  BUN 14 8 <5* <5*  CALCIUM 8.0* 8.4* 8.4* 8.1*  CREATININE 0.67 0.61 0.41* 0.48  GFRNONAA >60 >60 >60 >60    LIVER FUNCTION TESTS: Recent Labs    01/12/22 2051 01/18/22 0929 02/17/22 1630 03/13/22 1032  BILITOT 0.6 1.1 0.3 0.4  AST 18 29 12* 13*  ALT 15 27 9 8   ALKPHOS 102 75 77 88  PROT 6.1* 5.9* 5.4* 6.7  ALBUMIN 3.9 3.4* 2.0* 2.9*    Assessment:  Lindsey Thomas is a pleasant 81 yo female being seen today for drain care from LLQ drain placement 03/15/22. The patient reports no output from her LLQ drain for several days. Upon reviewing CT imaging with Dr Annamaria Boots, it was felt that the drain could be removed on today's visit as the drain had retracted from the site of original placement, and the original fluid collection on the left has resolved. Of note, there is a recurrence of a small fluid collection in the right lower abdomen. Further management of the lower right abdominal fluid collection will be  deferred to Dr Cintron's recommendations. Her LLQ drain was removed on today's visit with no complications.  Signed: Lura Em, PA-C 04/09/2022, 2:35 PM   Please refer to Dr. Fritz Pickerel attestation of this note for management and plan.

## 2022-04-20 DIAGNOSIS — B954 Other streptococcus as the cause of diseases classified elsewhere: Secondary | ICD-10-CM | POA: Diagnosis not present

## 2022-04-20 DIAGNOSIS — L89152 Pressure ulcer of sacral region, stage 2: Secondary | ICD-10-CM | POA: Diagnosis not present

## 2022-04-20 DIAGNOSIS — K651 Peritoneal abscess: Secondary | ICD-10-CM | POA: Diagnosis not present

## 2022-04-20 DIAGNOSIS — M170 Bilateral primary osteoarthritis of knee: Secondary | ICD-10-CM | POA: Diagnosis not present

## 2022-04-20 DIAGNOSIS — Z432 Encounter for attention to ileostomy: Secondary | ICD-10-CM | POA: Diagnosis not present

## 2022-04-20 DIAGNOSIS — T8143XA Infection following a procedure, organ and space surgical site, initial encounter: Secondary | ICD-10-CM | POA: Diagnosis not present

## 2022-04-20 DIAGNOSIS — J439 Emphysema, unspecified: Secondary | ICD-10-CM | POA: Diagnosis not present

## 2022-04-21 ENCOUNTER — Encounter (HOSPITAL_COMMUNITY): Payer: Self-pay | Admitting: Nurse Practitioner

## 2022-04-22 ENCOUNTER — Other Ambulatory Visit: Payer: Medicare HMO

## 2022-04-23 DIAGNOSIS — Z932 Ileostomy status: Secondary | ICD-10-CM | POA: Diagnosis not present

## 2022-04-23 DIAGNOSIS — K631 Perforation of intestine (nontraumatic): Secondary | ICD-10-CM | POA: Diagnosis not present

## 2022-04-23 DIAGNOSIS — K5732 Diverticulitis of large intestine without perforation or abscess without bleeding: Secondary | ICD-10-CM | POA: Diagnosis not present

## 2022-04-23 DIAGNOSIS — K56609 Unspecified intestinal obstruction, unspecified as to partial versus complete obstruction: Secondary | ICD-10-CM | POA: Diagnosis not present

## 2022-04-28 DIAGNOSIS — R55 Syncope and collapse: Secondary | ICD-10-CM | POA: Diagnosis not present

## 2022-05-06 DIAGNOSIS — Z79899 Other long term (current) drug therapy: Secondary | ICD-10-CM | POA: Diagnosis not present

## 2022-05-06 DIAGNOSIS — R8290 Unspecified abnormal findings in urine: Secondary | ICD-10-CM | POA: Diagnosis not present

## 2022-05-06 DIAGNOSIS — E7800 Pure hypercholesterolemia, unspecified: Secondary | ICD-10-CM | POA: Diagnosis not present

## 2022-05-06 DIAGNOSIS — I890 Lymphedema, not elsewhere classified: Secondary | ICD-10-CM | POA: Diagnosis not present

## 2022-05-11 DIAGNOSIS — K631 Perforation of intestine (nontraumatic): Secondary | ICD-10-CM | POA: Diagnosis not present

## 2022-05-11 DIAGNOSIS — K5732 Diverticulitis of large intestine without perforation or abscess without bleeding: Secondary | ICD-10-CM | POA: Diagnosis not present

## 2022-05-11 DIAGNOSIS — K56609 Unspecified intestinal obstruction, unspecified as to partial versus complete obstruction: Secondary | ICD-10-CM | POA: Diagnosis not present

## 2022-05-11 DIAGNOSIS — Z932 Ileostomy status: Secondary | ICD-10-CM | POA: Diagnosis not present

## 2022-05-17 DIAGNOSIS — G4733 Obstructive sleep apnea (adult) (pediatric): Secondary | ICD-10-CM | POA: Diagnosis not present

## 2022-05-17 DIAGNOSIS — R918 Other nonspecific abnormal finding of lung field: Secondary | ICD-10-CM | POA: Diagnosis not present

## 2022-05-17 DIAGNOSIS — J4530 Mild persistent asthma, uncomplicated: Secondary | ICD-10-CM | POA: Diagnosis not present

## 2022-05-17 DIAGNOSIS — J301 Allergic rhinitis due to pollen: Secondary | ICD-10-CM | POA: Diagnosis not present

## 2022-05-20 DIAGNOSIS — H353132 Nonexudative age-related macular degeneration, bilateral, intermediate dry stage: Secondary | ICD-10-CM | POA: Diagnosis not present

## 2022-05-20 DIAGNOSIS — H524 Presbyopia: Secondary | ICD-10-CM | POA: Diagnosis not present

## 2022-05-25 DIAGNOSIS — Z932 Ileostomy status: Secondary | ICD-10-CM | POA: Diagnosis not present

## 2022-05-25 DIAGNOSIS — K56609 Unspecified intestinal obstruction, unspecified as to partial versus complete obstruction: Secondary | ICD-10-CM | POA: Diagnosis not present

## 2022-05-25 DIAGNOSIS — K5732 Diverticulitis of large intestine without perforation or abscess without bleeding: Secondary | ICD-10-CM | POA: Diagnosis not present

## 2022-05-25 DIAGNOSIS — K631 Perforation of intestine (nontraumatic): Secondary | ICD-10-CM | POA: Diagnosis not present

## 2022-06-01 ENCOUNTER — Ambulatory Visit (HOSPITAL_COMMUNITY)
Admission: RE | Admit: 2022-06-01 | Discharge: 2022-06-01 | Disposition: A | Discharge status: Home / Self Care | Payer: Medicare HMO | Source: Ambulatory Visit | Attending: Internal Medicine | Admitting: Internal Medicine

## 2022-06-01 DIAGNOSIS — L929 Granulomatous disorder of the skin and subcutaneous tissue, unspecified: Secondary | ICD-10-CM | POA: Diagnosis not present

## 2022-06-01 DIAGNOSIS — Z432 Encounter for attention to ileostomy: Secondary | ICD-10-CM

## 2022-06-01 DIAGNOSIS — L89152 Pressure ulcer of sacral region, stage 2: Secondary | ICD-10-CM | POA: Diagnosis not present

## 2022-06-01 DIAGNOSIS — L24B3 Irritant contact dermatitis related to fecal or urinary stoma or fistula: Secondary | ICD-10-CM

## 2022-06-01 DIAGNOSIS — Z932 Ileostomy status: Secondary | ICD-10-CM | POA: Diagnosis not present

## 2022-06-01 NOTE — Discharge Instructions (Signed)
Calmoseptine to buttocks wound Aquacel Ag to open wound, cover with barrier ring POuch as normally would

## 2022-06-01 NOTE — Progress Notes (Signed)
[facility]   Reason for visit:  RLQ ileostomy with peristomal lesion HPI:  Intraabdominal abscess Past Medical History:  Diagnosis Date   Anxiety    Arthritis    Asthma    DVT (deep venous thrombosis) (HCC)    Edema    feet   Emphysema of lung (HCC)    GERD (gastroesophageal reflux disease)    Headache    MIGRAINES   History of orthopnea    Hypercholesterolemia    Hypothyroidism    NODULES   Lymphedema    Migraine    Non-small cell lung cancer, right ([place]) 05/2019   Rad tx's   Shortness of breath dyspnea    WITH EXERTION   Sleep apnea    MILD, DOES NOT USE CPAP   Thyroid nodule    Family History  Problem Relation Age of Onset   Emphysema Mother    Heart Problems Mother    Tuberculosis Father    Brain cancer Father    Skin cancer Father    Breast cancer Sister    Irritable bowel syndrome Sister    Lung cancer Brother    Dementia Sister    Schizophrenia Sister    Allergies  Allergen Reactions   Eryc [Erythromycin]    Levofloxacin Nausea Only   Percocet [Oxycodone-Acetaminophen] Nausea And Vomiting   Augmentin [Amoxicillin-Pot Clavulanate] Rash   Celecoxib Palpitations   Current Outpatient Medications  Medication Sig Dispense Refill Last Dose   acetaminophen (TYLENOL) 500 MG tablet Take 1,000 mg by mouth daily as needed for pain.      apixaban (ELIQUIS) 5 MG TABS tablet Take 5 mg by mouth 2 (two) times daily.      ascorbic acid (VITAMIN C) 500 MG tablet Take 1 tablet (500 mg total) by mouth 2 (two) times daily. 60 tablet 1    aspirin EC 81 MG tablet Take 81 mg by mouth daily.      budesonide-formoterol (SYMBICORT) 160-4.5 MCG/ACT inhaler Inhale 2 puffs into the lungs 2 (two) times daily.      calcium carbonate (CALCIUM ANTACID) 500 MG chewable tablet Chew 2 tablets by mouth daily.      Cholecalciferol (VITAMIN D PO) Take 5,000 Units by mouth daily.      Cyanocobalamin (VITAMIN B-12 PO) Take 1,000 mcg by mouth daily.      feeding supplement  (ENSURE ENLIVE / ENSURE PLUS) LIQD Take 237 mLs by mouth 3 (three) times daily between meals. 237 mL 12    fluticasone (FLONASE) 50 MCG/ACT nasal spray Place 2 sprays into both nostrils at bedtime.      furosemide (LASIX) 20 MG tablet Take 20 mg by mouth daily.      leptospermum manuka honey (MEDIHONEY) PSTE paste Apply Medihoney to sacrum wound Q day, then cover with foam dressing.  Change foam dressing Q 3 days or PRN soiling Apply thin layer (3 mm) to wound. 44 mL 1    metoprolol tartrate (LOPRESSOR) 25 MG tablet Take 1 tablet (25 mg total) by mouth 2 (two) times daily. 60 tablet 0    montelukast (SINGULAIR) 10 MG tablet Take 10 mg by mouth at bedtime.       Multiple Vitamins-Minerals (PRESERVISION AREDS PO) Take 1 capsule by mouth 2 (two) times daily.      omeprazole (PRILOSEC) 20 MG capsule Take 20 mg by mouth 2 (two) times daily before a meal.      Polyethyl Glycol-Propyl Glycol (SYSTANE) 0.4-0.3 % SOLN Apply 1 drop to eye  2 (two) times daily.      Sodium Chloride Flush (SALINE FLUSH) 0.9 % SOLN Use as directed 10 mL 0    traMADol (ULTRAM) 50 MG tablet Take 1 tablet (50 mg total) by mouth every 6 (six) hours as needed for moderate pain. 20 tablet 0    No current facility-administered medications for this encounter.   ROS  Review of Systems Vital signs:  BP (!) 106/50 (BP Location: Right Arm)   Temp 98 F (36.7 C) (Oral)   Resp 18  Exam:  Physical Exam  Stoma type/location:  RLQ ileostomy Stomal assessment/size:  1 1/2" slightly oval Peristomal assessment:  0.3 cm x 0.4 cm raised red moist lesion at 9 o'clock.  Very tender to touch.  Consistent with large area of hypergranulation.  Would be atypical appearance of granuloma. Dr Windell Moment has applied silver nitrate once.  This was very painful, daughter and patient report and made little difference.  I explained that the silver nitrate treatment may help, but needs to be applied multiple times.  We will begin silver hydrofiber to the  lesion today.  Treatment options for stomal/peristomal skin: aquacel, barrier ring and 1 piece convex pouch Output: soft brown stool Ostomy pouching: 1pc.convex with barrier strips to perimeter.  Education provided:  Demonstrated wound care to peristomal lesion.  Apply aquacel to open peristomal wound, cover wound and peristomal skin with barrier ring.  Has stage 2 pressure injury to coccyx, implemented calmoseptine ointment and encouraged to minimize use of Pull up adult briefs to avoid too much moisture to this area.  She has a raised knot under the skin to the right of this area.  No redness, warmth or concern for infection. Appears to be related to prolonged pressure  Encouraged to change position often to offload pressure    Impression/dx  Stage 2 pressure injury, coccyx Peristomal breakdown with hypergranulation Discussion  See above Plan  See back in 2 weeks    Visit time: 45 minutes.   Domenic Moras FNP-BC

## 2022-06-03 DIAGNOSIS — K5733 Diverticulitis of large intestine without perforation or abscess with bleeding: Secondary | ICD-10-CM | POA: Diagnosis not present

## 2022-06-06 IMAGING — CT CT CHEST W/O CM
2 of 4 series · 15 of 36 positions shown, 18 images · non-contrast
Comparison: 11/29/2020.

CLINICAL DATA: Lung cancer, radiation therapy completed July 2019. COVID infection February 2021.

EXAM:
CT CHEST WITHOUT CONTRAST
TECHNIQUE: Multidetector CT imaging of the chest was performed following the
standard protocol without IV contrast.

[Series 2: chest 2.00 · axial · 0.60mm/px · z∈[-1147,-917]mm · 12 of 137 slices shown, 15 images]
[im 11/137  mediastinal]
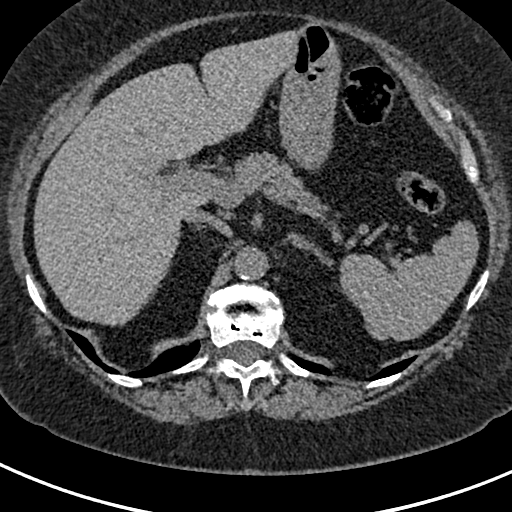
[im 11/137  lung]
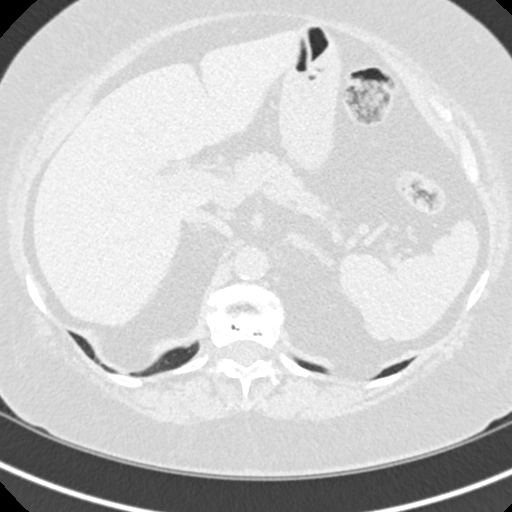
[im 21/137  lung]
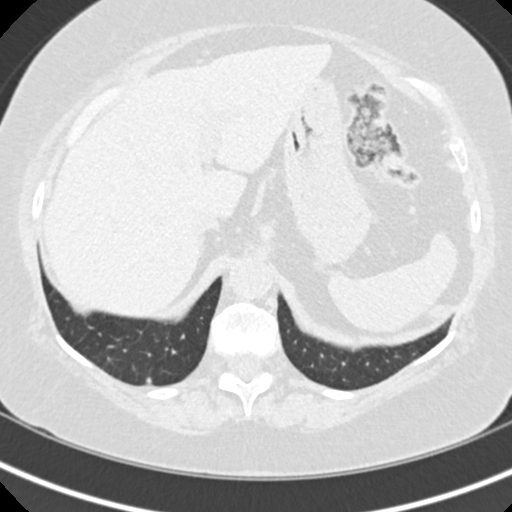
[im 32/137  lung]
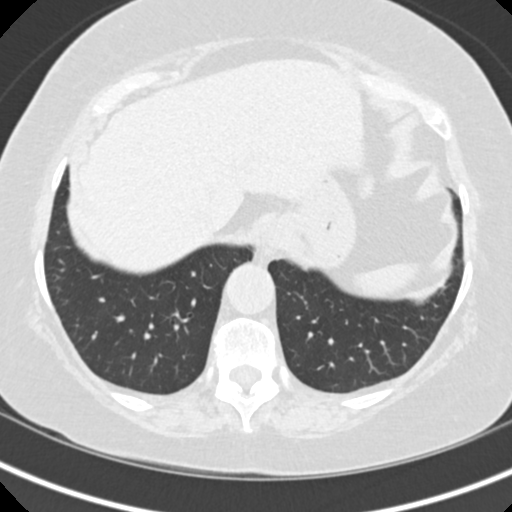
[im 42/137  lung]
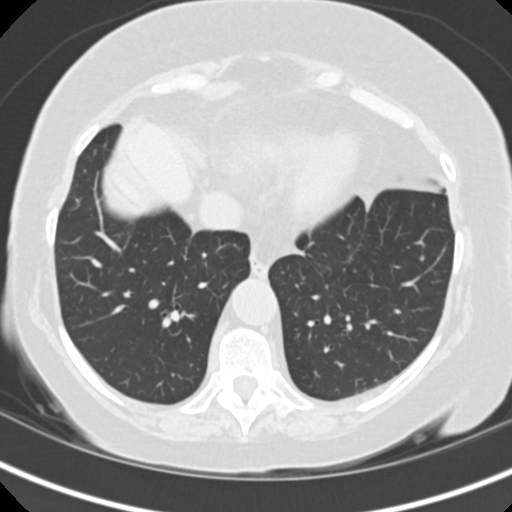
[im 53/137  mediastinal]
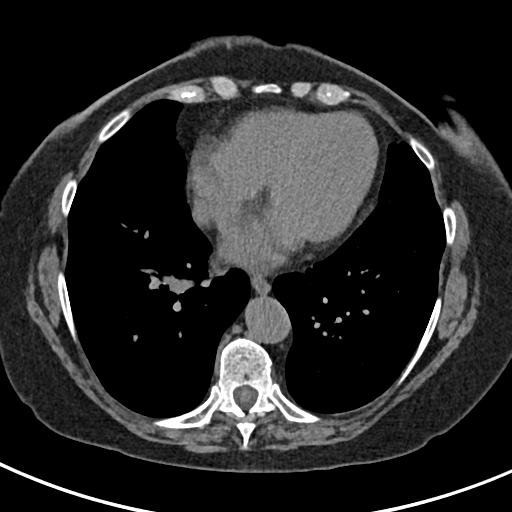
[im 53/137  lung]
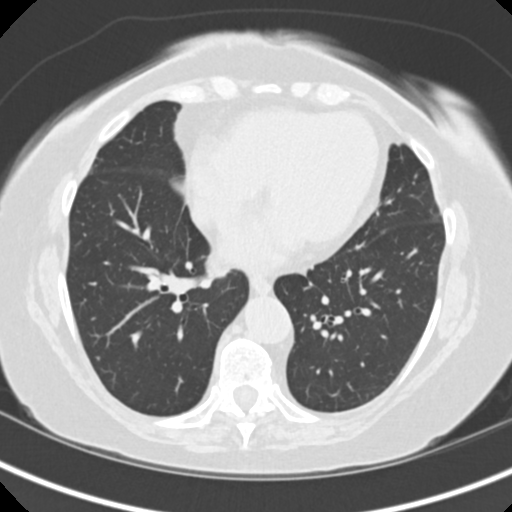
[im 63/137  lung]
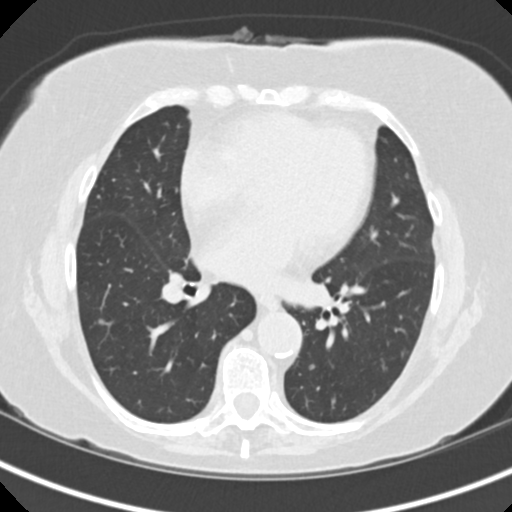
[im 74/137  lung]
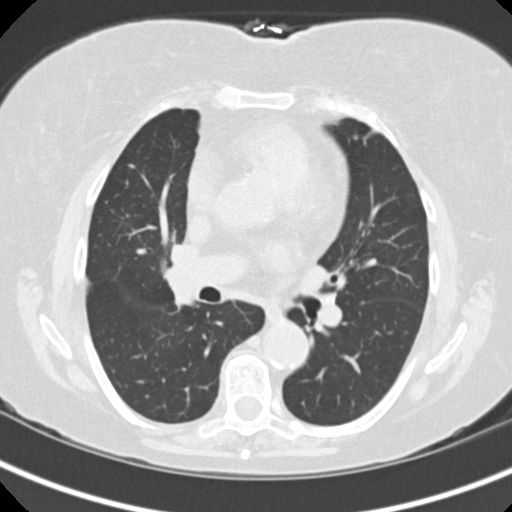
[im 84/137  lung]
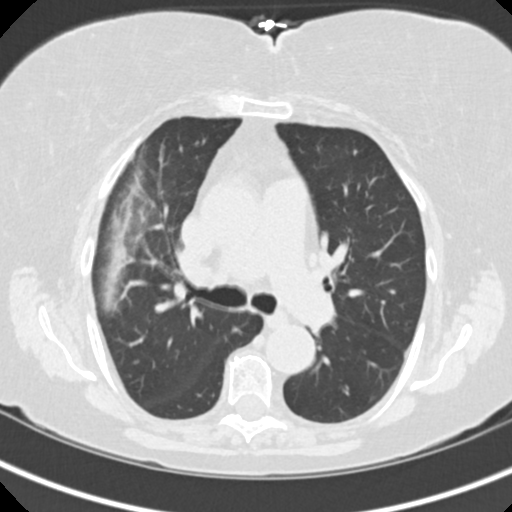
[im 95/137  mediastinal]
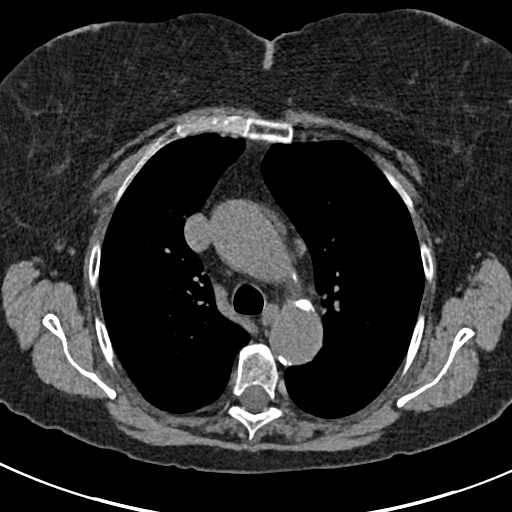
[im 95/137  lung]
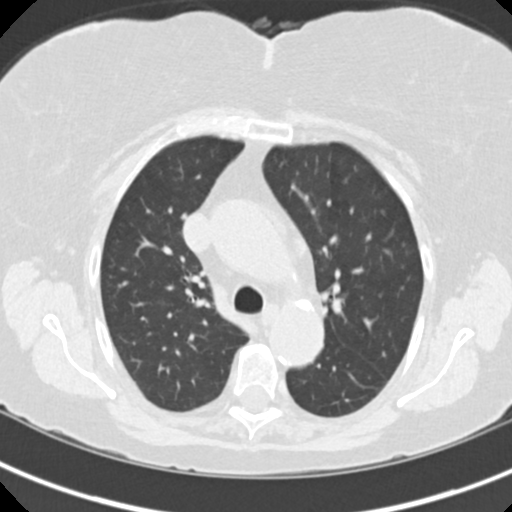
[im 105/137  lung]
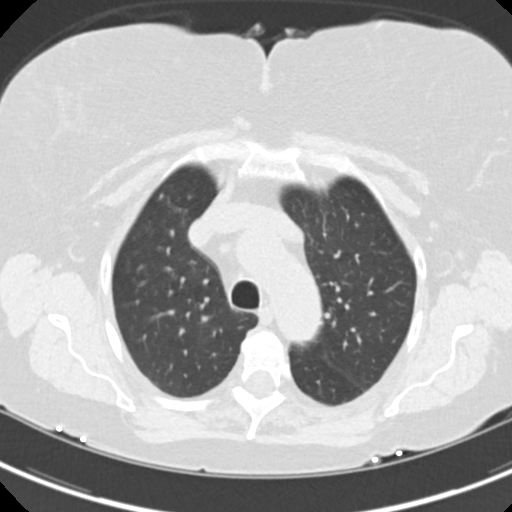
[im 116/137  lung]
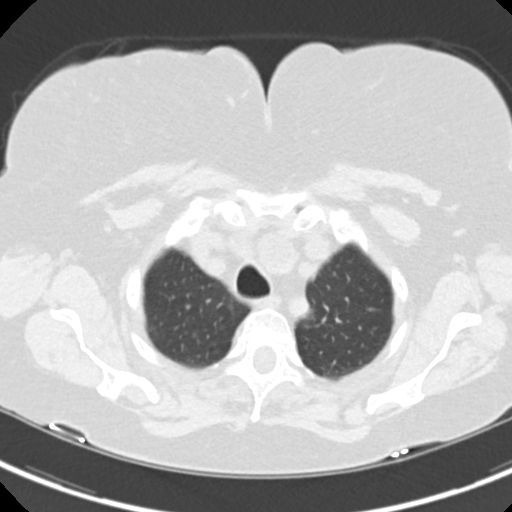
[im 126/137  lung]
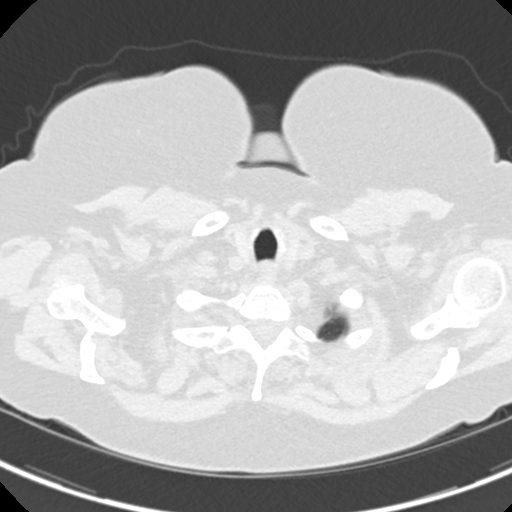

[Series 5: coronals chest 2.00 cor · coronal · 0.54mm/px · 3 of 138 slices shown]
[im 28/138  lung]
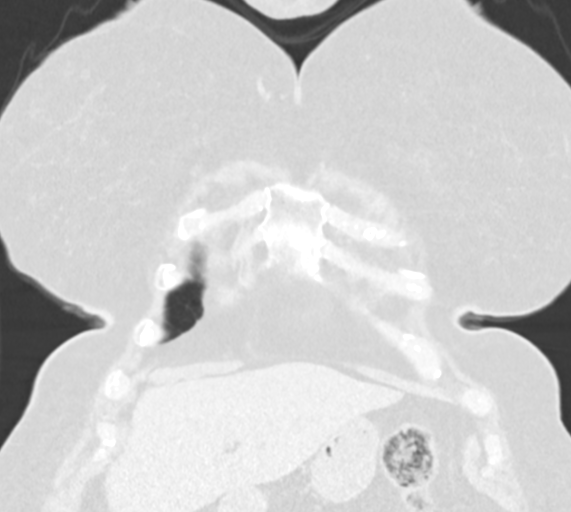
[im 55/138  lung]
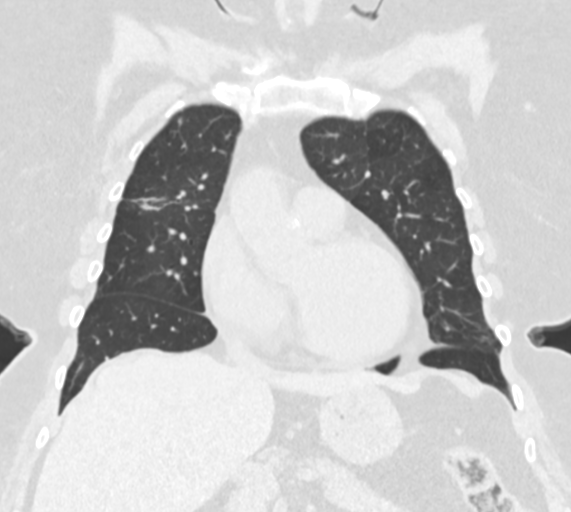
[im 83/138  lung]
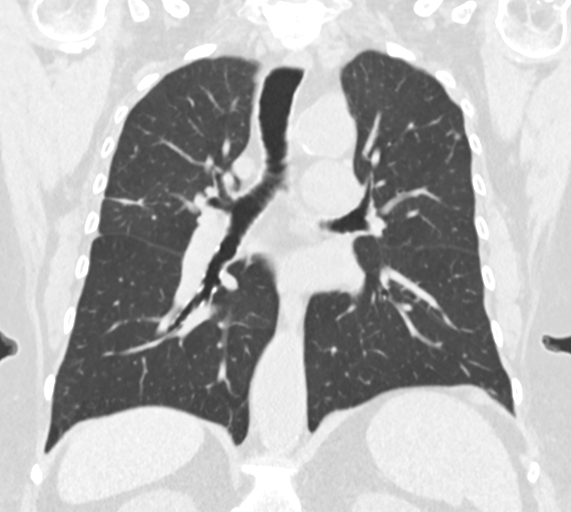

[15 of 36 positions shown; findings below may reference images not displayed]

FINDINGS: Cardiovascular: Atherosclerotic calcification of the aorta and
coronary arteries. Heart is at the upper limits of normal in size.
No pericardial effusion.

Mediastinum/Nodes: 2.3 cm low-attenuation left thyroid nodule,
similar. No pathologically enlarged mediastinal or axillary lymph
nodes. Hilar regions are difficult to evaluate without IV contrast.
Esophagus is grossly unremarkable.

Lungs/Pleura: Image quality is degraded by respiratory motion.
Platelike scarring along the minor fissure. 3 mm right middle lobe
nodule (3/63) appears new. Other scattered pulmonary nodules measure
up to 4 mm in the left upper lobe (3/37). No pleural fluid. Airway
is unremarkable.

Upper Abdomen: Visualized portions of the liver, gallbladder,
adrenal glands, kidneys, spleen, pancreas, stomach and bowel are
unremarkable. 10 mm peripherally calcified splenic artery aneurysm.

Musculoskeletal: Degenerative changes in the spine. No worrisome
lytic or sclerotic lesions.
IMPRESSION: 1. Possible new 3 mm right middle lobe nodule. Recommend attention
on follow-up. Other scattered pulmonary nodules are unchanged. Post
treatment scarring along the minor fissure.
2. 10 mm peripherally calcified splenic artery aneurysm.
3. 2.3 cm low-attenuation left thyroid nodule. Recommend thyroid
ultrasound. (Ref: [HOSPITAL]. [DATE]): 143-50).
4. Aortic atherosclerosis ([id number]). Coronary artery
calcification.

## 2022-06-11 ENCOUNTER — Ambulatory Visit
Admission: RE | Admit: 2022-06-11 | Discharge: 2022-06-11 | Disposition: A | Discharge status: Home / Self Care | Payer: Medicare HMO | Source: Ambulatory Visit | Attending: Oncology | Admitting: Oncology

## 2022-06-11 DIAGNOSIS — C3411 Malignant neoplasm of upper lobe, right bronchus or lung: Secondary | ICD-10-CM | POA: Diagnosis not present

## 2022-06-11 DIAGNOSIS — R918 Other nonspecific abnormal finding of lung field: Secondary | ICD-10-CM | POA: Diagnosis not present

## 2022-06-11 DIAGNOSIS — Z08 Encounter for follow-up examination after completed treatment for malignant neoplasm: Secondary | ICD-10-CM | POA: Diagnosis not present

## 2022-06-11 DIAGNOSIS — Z85118 Personal history of other malignant neoplasm of bronchus and lung: Secondary | ICD-10-CM | POA: Insufficient documentation

## 2022-06-14 DIAGNOSIS — K631 Perforation of intestine (nontraumatic): Secondary | ICD-10-CM | POA: Diagnosis not present

## 2022-06-14 DIAGNOSIS — Z932 Ileostomy status: Secondary | ICD-10-CM | POA: Diagnosis not present

## 2022-06-14 DIAGNOSIS — K5732 Diverticulitis of large intestine without perforation or abscess without bleeding: Secondary | ICD-10-CM | POA: Diagnosis not present

## 2022-06-14 DIAGNOSIS — K56609 Unspecified intestinal obstruction, unspecified as to partial versus complete obstruction: Secondary | ICD-10-CM | POA: Diagnosis not present

## 2022-06-15 ENCOUNTER — Ambulatory Visit (HOSPITAL_COMMUNITY): Payer: Medicare HMO | Admitting: Nurse Practitioner

## 2022-06-16 ENCOUNTER — Inpatient Hospital Stay: Payer: Medicare HMO | Attending: Oncology | Admitting: Oncology

## 2022-06-16 ENCOUNTER — Encounter: Payer: Self-pay | Admitting: Oncology

## 2022-06-16 VITALS — BP 121/46 | HR 72 | Temp 98.1°F | Resp 18 | Wt 143.3 lb

## 2022-06-16 DIAGNOSIS — Z85110 Personal history of malignant carcinoid tumor of bronchus and lung: Secondary | ICD-10-CM | POA: Diagnosis not present

## 2022-06-16 DIAGNOSIS — C3490 Malignant neoplasm of unspecified part of unspecified bronchus or lung: Secondary | ICD-10-CM

## 2022-06-16 DIAGNOSIS — Z85118 Personal history of other malignant neoplasm of bronchus and lung: Secondary | ICD-10-CM

## 2022-06-16 DIAGNOSIS — Z08 Encounter for follow-up examination after completed treatment for malignant neoplasm: Secondary | ICD-10-CM | POA: Diagnosis not present

## 2022-06-16 DIAGNOSIS — K631 Perforation of intestine (nontraumatic): Secondary | ICD-10-CM | POA: Diagnosis not present

## 2022-06-16 DIAGNOSIS — Z932 Ileostomy status: Secondary | ICD-10-CM | POA: Diagnosis not present

## 2022-06-16 DIAGNOSIS — Z923 Personal history of irradiation: Secondary | ICD-10-CM | POA: Diagnosis not present

## 2022-06-16 DIAGNOSIS — K56609 Unspecified intestinal obstruction, unspecified as to partial versus complete obstruction: Secondary | ICD-10-CM | POA: Diagnosis not present

## 2022-06-16 DIAGNOSIS — K5732 Diverticulitis of large intestine without perforation or abscess without bleeding: Secondary | ICD-10-CM | POA: Diagnosis not present

## 2022-06-17 ENCOUNTER — Encounter: Payer: Self-pay | Admitting: Oncology

## 2022-06-17 DIAGNOSIS — D649 Anemia, unspecified: Secondary | ICD-10-CM | POA: Diagnosis not present

## 2022-06-17 DIAGNOSIS — Z933 Colostomy status: Secondary | ICD-10-CM | POA: Diagnosis not present

## 2022-06-17 DIAGNOSIS — Z79899 Other long term (current) drug therapy: Secondary | ICD-10-CM | POA: Diagnosis not present

## 2022-06-17 DIAGNOSIS — J449 Chronic obstructive pulmonary disease, unspecified: Secondary | ICD-10-CM | POA: Diagnosis not present

## 2022-06-17 DIAGNOSIS — I890 Lymphedema, not elsewhere classified: Secondary | ICD-10-CM | POA: Diagnosis not present

## 2022-06-17 NOTE — Progress Notes (Signed)
Hematology/Oncology Consult note [facility]  Telephone:([phone] [phone]  Patient Care Team: Idelle Crouch, MD as PCP - General (Internal Medicine) Noreene Filbert, MD as Referring Physician (Radiation Oncology) Sindy Guadeloupe, MD as Consulting Physician (Oncology) Erby Pian, MD as Referring Physician (Specialist) Telford Nab, RN as Registered Nurse   Name of the patient: Lindsey Thomas  [id number]  04-13-1941   Date of visit: 06/17/22  Diagnosis- history of lung cancer s/p SBRT   Chief complaint/ Reason for visit-CT scan results and further management  Heme/Onc history: patient is a 82 year old female with a past medical history significant for COPD hypertension hyperlipidemia and hypothyroidism among other medical problems.  She presented with symptoms of shortness of breath which prompted a CT scan. CT chest on 04/17/2019 showed a spiculated peripheral right upper lobe lung lesion measuring 18 x 13 mm concerning for primary lung cancer.  No evidence of mediastinal or hilar adenopathy.  This was followed by a PET CT scan which again showed uptake with an SUV of 3.1 in the area of the right upper lobe.  Smaller subcentimeter pulmonary nodules with no significant metabolic uptake.  No metabolic adenopathy.  Patient was seen by Dr. Raul Del for possible bronchoscopy.  Patient tells me that Dr. Raul Del also got in touch with radiology who thought that CT-guided lung biopsy did carry a risk of pneumothorax and lung collapse.  biopsy was not possible safely and she underwent SBRT to RUL without biopsy    Interval history- She is doing well for her age.    Denies any cough shortness of breath or fever.  Patient did have perforated diverticulitis requiring colectomy end ileostomy which was complicated by pelvic abscesses.  She is following up with Dr. Peyton Najjar for the same.  ECOG PS- 2 Pain scale- 0   Review of systems- Review of Systems   Constitutional:  Positive for malaise/fatigue. Negative for chills, fever and weight loss.  HENT:  Negative for congestion, ear discharge and nosebleeds.   Eyes:  Negative for blurred vision.  Respiratory:  Negative for cough, hemoptysis, sputum production, shortness of breath and wheezing.   Cardiovascular:  Negative for chest pain, palpitations, orthopnea and claudication.  Gastrointestinal:  Negative for abdominal pain, blood in stool, constipation, diarrhea, heartburn, melena, nausea and vomiting.  Genitourinary:  Negative for dysuria, flank pain, frequency, hematuria and urgency.  Musculoskeletal:  Negative for back pain, joint pain and myalgias.  Skin:  Negative for rash.  Neurological:  Negative for dizziness, tingling, focal weakness, seizures, weakness and headaches.  Endo/Heme/Allergies:  Does not bruise/bleed easily.  Psychiatric/Behavioral:  Negative for depression and suicidal ideas. The patient does not have insomnia.       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   Rad tx's   Shortness of breath  dyspnea    WITH EXERTION   Sleep apnea    MILD, DOES NOT USE CPAP   Thyroid nodule      Past Surgical History:  Procedure Laterality Date   ABDOMINAL HYSTERECTOMY     BREAST BIOPSY     CARDIAC CATHETERIZATION     CATARACT EXTRACTION W/PHACO Left 02/12/2016   Procedure: CATARACT EXTRACTION PHACO AND INTRAOCULAR LENS  PLACEMENT ([place]);  Surgeon: Eulogio Bear, MD;  Location: [facility];  Service: Ophthalmology;  Laterality: Left;  [place] 01:30 AP% 15.2 CDE 13.71 Fluid pack lot # [id number]   CATARACT EXTRACTION W/PHACO Right 03/18/2016   Procedure: CATARACT EXTRACTION PHACO AND INTRAOCULAR LENS PLACEMENT (I[facility]);  Surgeon: Eulogio Bear, MD;  Location: [facility];  Service: Ophthalmology;  Laterality: Right;  Lot # [id number] H [place]: 01:05.5 AP%:14.3 CDE: 9.33   COLECTOMY WITH COLOSTOMY CREATION/HARTMANN PROCEDURE N/A 01/20/2022   Procedure: COLECTOMY WITH COLOSTOMY CREATION/HARTMANN PROCEDURE;  Surgeon: Herbert Pun, MD;  Location: [facility];  Service: General;  Laterality: N/A;   FLEXIBLE SIGMOIDOSCOPY N/A 01/14/2022   Procedure: FLEXIBLE SIGMOIDOSCOPY;  Surgeon: Toledo, Benay Pike, MD;  Location: [facility];  Service: Gastroenterology;  Laterality: N/A;   FLEXIBLE SIGMOIDOSCOPY N/A 01/19/2022   Procedure: FLEXIBLE SIGMOIDOSCOPY;  Surgeon: Lesly Rubenstein, MD;  Location: [facility];  Service: Endoscopy;  Laterality: N/A;   FRACTURE SURGERY Left    arm rod and screw   IR RADIOLOGIST EVAL & MGMT  03/04/2022   KNEE ARTHROSCOPY     LAPAROTOMY N/A 01/24/2022   Procedure: EXPLORATORY LAPAROTOMY;  Surgeon: Herbert Pun, MD;  Location: [facility];  Service: General;  Laterality: N/A;   OOPHORECTOMY     RCR     ROTATOR CUFF REPAIR Left 2006    Social History   Socioeconomic History   Marital status: Married    Spouse name: Not on file   Number of children: Not on file   Years of education: Not on file   Highest education level: Not on file  Occupational History   Not on file  Tobacco Use   Smoking status: Never   Smokeless tobacco: Never  Substance and Sexual Activity   Alcohol use: No   Drug use: Never   Sexual activity: Not Currently  Other Topics Concern   Not on file  Social History Narrative   Not on file   Social Determinants of Health   Financial Resource Strain: Not on  file  Food Insecurity: No Food Insecurity (03/15/2022)   Hunger Vital Sign    Worried About Running Out of Food in the Last Year: Never true    Ran Out of Food in the Last Year: Never true  Transportation Needs: No Transportation Needs (03/15/2022)   PRAPARE - Hydrologist (Medical): No    Lack of Transportation (Non-Medical): No  Physical Activity: Not on file  Stress: Not on file  Social Connections: Not on file  Intimate Partner Violence: Not At Risk (03/15/2022)   Humiliation, Afraid, Rape, and Kick questionnaire    Fear of Current or Ex-Partner: No    Emotionally Abused: No    Physically Abused: No    Sexually Abused: No    Family History  Problem Relation Age of Onset   Emphysema Mother    Heart Problems Mother    Tuberculosis Father    Brain cancer Father    Skin cancer Father    Breast cancer Sister    Irritable bowel syndrome Sister  Lung cancer Brother    Dementia Sister    Schizophrenia Sister      Current Outpatient Medications:    acetaminophen (TYLENOL) 500 MG tablet, Take 1,000 mg by mouth daily as needed for pain., Disp: , Rfl:    apixaban (ELIQUIS) 5 MG TABS tablet, Take 5 mg by mouth 2 (two) times daily., Disp: , Rfl:    ascorbic acid (VITAMIN C) 500 MG tablet, Take 1 tablet (500 mg total) by mouth 2 (two) times daily., Disp: 60 tablet, Rfl: 1   aspirin EC 81 MG tablet, Take 81 mg by mouth daily., Disp: , Rfl:    budesonide-formoterol (SYMBICORT) 160-4.5 MCG/ACT inhaler, Inhale 2 puffs into the lungs 2 (two) times daily., Disp: , Rfl:    calcium carbonate (CALCIUM ANTACID) 500 MG chewable tablet, Chew 2 tablets by mouth daily., Disp: , Rfl:    Cholecalciferol (VITAMIN D PO), Take 5,000 Units by mouth daily., Disp: , Rfl:    Cyanocobalamin (VITAMIN B-12 PO), Take 1,000 mcg by mouth daily., Disp: , Rfl:    feeding supplement (ENSURE ENLIVE / ENSURE PLUS) LIQD, Take 237 mLs by mouth 3 (three) times daily between meals., Disp: 237  mL, Rfl: 12   fluticasone (FLONASE) 50 MCG/ACT nasal spray, Place 2 sprays into both nostrils at bedtime., Disp: , Rfl:    loperamide (IMODIUM A-D) 2 MG tablet, Take 4 mg by mouth 2 (two) times daily., Disp: , Rfl:    metoprolol tartrate (LOPRESSOR) 25 MG tablet, Take 1 tablet (25 mg total) by mouth 2 (two) times daily., Disp: 60 tablet, Rfl: 0   montelukast (SINGULAIR) 10 MG tablet, Take 10 mg by mouth at bedtime. , Disp: , Rfl:    Multiple Vitamins-Minerals (PRESERVISION AREDS PO), Take 1 capsule by mouth 2 (two) times daily., Disp: , Rfl:    omeprazole (PRILOSEC) 20 MG capsule, Take 20 mg by mouth 2 (two) times daily before a meal., Disp: , Rfl:    Polyethyl Glycol-Propyl Glycol (SYSTANE) 0.4-0.3 % SOLN, Apply 1 drop to eye 2 (two) times daily., Disp: , Rfl:    furosemide (LASIX) 20 MG tablet, Take 20 mg by mouth daily. (Patient not taking: Reported on 06/16/2022), Disp: , Rfl:    leptospermum manuka honey (MEDIHONEY) PSTE paste, Apply Medihoney to sacrum wound Q day, then cover with foam dressing.  Change foam dressing Q 3 days or PRN soiling Apply thin layer (3 mm) to wound. (Patient not taking: Reported on 06/16/2022), Disp: 44 mL, Rfl: 1   Sodium Chloride Flush (SALINE FLUSH) 0.9 % SOLN, Use as directed (Patient not taking: Reported on 06/16/2022), Disp: 10 mL, Rfl: 0   traMADol (ULTRAM) 50 MG tablet, Take 1 tablet (50 mg total) by mouth every 6 (six) hours as needed for moderate pain. (Patient not taking: Reported on 06/16/2022), Disp: 20 tablet, Rfl: 0  Physical exam:  Vitals:   06/16/22 1145  BP: (!) 121/46  Pulse: 72  Resp: 18  Temp: 98.1 F (36.7 C)  TempSrc: Tympanic  SpO2: 94%  Weight: 143 lb 4.8 oz (65 kg)   Physical Exam Cardiovascular:     Rate and Rhythm: Normal rate and regular rhythm.     Heart sounds: Normal heart sounds.  Pulmonary:     Effort: Pulmonary effort is normal.     Breath sounds: Normal breath sounds.  Abdominal:     Palpations: Abdomen is soft.      Comments: Ileostomy in place  Skin:    General: Skin is warm  and dry.  Neurological:     Mental Status: She is alert and oriented to person, place, and time.         Latest Ref Rng & Units 03/17/2022    4:01 AM  CMP  Glucose 70 - 99 mg/dL 113   BUN 8 - 23 mg/dL <5   Creatinine 0.44 - 1.00 mg/dL 0.48   Sodium 135 - 145 mmol/L 130   Potassium 3.5 - 5.1 mmol/L 3.4   Chloride 98 - 111 mmol/L 104   CO2 22 - 32 mmol/L 21   Calcium 8.9 - 10.3 mg/dL 8.1       Latest Ref Rng & Units 03/17/2022    4:01 AM  CBC  WBC 4.0 - 10.5 K/uL 10.9   Hemoglobin 12.0 - 15.0 g/dL 7.3   Hematocrit 36.0 - 46.0 % 22.3   Platelets 150 - 400 K/uL 334     No images are attached to the encounter.  CT Chest Wo Contrast  Result Date: 06/12/2022 CLINICAL DATA:  Non-small cell right upper lobe lung cancer. Status post radiation therapy. Restaging examination. EXAM: CT CHEST WITHOUT CONTRAST TECHNIQUE: Multidetector CT imaging of the chest was performed following the standard protocol without IV contrast. RADIATION DOSE REDUCTION: This exam was performed according to the departmental dose-optimization program which includes automated exposure control, adjustment of the mA and/or kV according to patient size and/or use of iterative reconstruction technique. COMPARISON:  12/04/2021 FINDINGS: Cardiovascular: Mild coronary artery calcification. Global cardiac size within normal limits. No pericardial effusion. Central pulmonary arteries are mildly enlarged in keeping with changes of pulmonary arterial hypertension. Mild atherosclerotic calcification within the thoracic aorta. No aortic aneurysm. Mediastinum/Nodes: 2.4 cm hypoattenuating left thyroid nodule is unchanged and previously noted to be metabolically negative on PET CT examination of 05/09/2019. No follow-up imaging is recommended. No pathologic adenopathy within the thorax. The esophagus is unremarkable. Lungs/Pleura: Band like parenchymal scarring within the  right upper lobe likely representing post treatment change is again noted, stable. Branching opacities within the left upper lobe and right lower lobe at axial image # 48/3 and 66/3, respectively, favored to represent airway impaction appears stable since prior examination. Numerous noncalcified tiny pulmonary nodules are again identified demonstrating a basilar predominance and are stable since prior examination in both size and number with the dominant nodule seen within the left upper lobe at axial image # 33/3 measuring up to 5 mm, safely considered benign. No new focal pulmonary nodules are identified. No pneumothorax or pleural effusion. No central obstructing lesion. 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 No acute abnormality. Musculoskeletal: No acute bone abnormality. No lytic or blastic bone lesion. IMPRESSION: Stable post treatment changes within the right upper lobe. No evidence of recurrent or residual disease within the chest. Stable areas of suspected mucoid impaction within the left upper lobe and right lower lobe, likely post infectious. Stable tiny noncalcified pulmonary nodules, safely considered benign. No new focal pulmonary nodules identified. Aortic Atherosclerosis (ICD10-I70.0). Electronically Signed   By: Fidela Salisbury M.D.   On: 06/12/2022 23:03     Assessment and plan- Patient is a 82 y.o. female with history of presumed stage I lung cancer involving the right upper lobe s/p SBRT here to discuss CT scan results and further management  I have reviewed CT chest images independently and discussed findings with the patient which does not show any evidence of recurrent or progressive disease.  Posttreatment changes noted in  right upper lobe.  I will see her back in 6 months no labs with repeat CT chest without contrast   Visit Diagnosis 1. Encounter for follow-up surveillance of lung cancer      Dr. Randa Evens, MD,  [facility] [phone] 06/17/2022 11:28 AM

## 2022-06-18 ENCOUNTER — Ambulatory Visit (HOSPITAL_COMMUNITY)
Admission: RE | Admit: 2022-06-18 | Discharge: 2022-06-18 | Disposition: A | Discharge status: Home / Self Care | Payer: Medicare HMO | Source: Ambulatory Visit | Attending: Nurse Practitioner | Admitting: Nurse Practitioner

## 2022-06-18 DIAGNOSIS — K9419 Other complications of enterostomy: Secondary | ICD-10-CM | POA: Insufficient documentation

## 2022-06-18 DIAGNOSIS — Y832 Surgical operation with anastomosis, bypass or graft as the cause of abnormal reaction of the patient, or of later complication, without mention of misadventure at the time of the procedure: Secondary | ICD-10-CM | POA: Insufficient documentation

## 2022-06-18 DIAGNOSIS — K435 Parastomal hernia without obstruction or  gangrene: Secondary | ICD-10-CM | POA: Diagnosis not present

## 2022-06-18 DIAGNOSIS — L24B3 Irritant contact dermatitis related to fecal or urinary stoma or fistula: Secondary | ICD-10-CM

## 2022-06-18 DIAGNOSIS — Z932 Ileostomy status: Secondary | ICD-10-CM | POA: Diagnosis present

## 2022-06-18 DIAGNOSIS — K9413 Enterostomy malfunction: Secondary | ICD-10-CM

## 2022-06-18 NOTE — Discharge Instructions (Signed)
See in one week Continue aquacel    Will likely apply silver nitrate to hypergranulation next week

## 2022-06-18 NOTE — Progress Notes (Signed)
[facility]   Reason for visit:  RLQ ileostomy with parastomal hernia HPI:  Intraabdominal abscess Past Medical History:  Diagnosis Date   Anxiety    Arthritis    Asthma    DVT (deep venous thrombosis) (HCC)    Edema    feet   Emphysema of lung (HCC)    GERD (gastroesophageal reflux disease)    Headache    MIGRAINES   History of orthopnea    Hypercholesterolemia    Hypothyroidism    NODULES   Lymphedema    Migraine    Non-small cell lung cancer, 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   Rad tx's   Shortness of breath dyspnea    WITH EXERTION   Sleep apnea    MILD, DOES NOT USE CPAP   Thyroid nodule    Family History  Problem Relation Age of Onset   Emphysema Mother    Heart Problems Mother    Tuberculosis Father    Brain cancer Father    Skin cancer Father    Breast cancer Sister    Irritable bowel syndrome Sister    Lung cancer Brother    Dementia Sister    Schizophrenia Sister    Allergies  Allergen Reactions   Eryc [Erythromycin]    Levofloxacin Nausea Only   Percocet [Oxycodone-Acetaminophen] Nausea And Vomiting   Augmentin [Amoxicillin-Pot Clavulanate] Rash   Celecoxib Palpitations   Current Outpatient Medications  Medication Sig Dispense Refill Last Dose   acetaminophen (TYLENOL) 500 MG tablet Take 1,000 mg by mouth daily as needed for pain.      apixaban (ELIQUIS) 5 MG TABS tablet Take 5 mg by mouth 2 (two) times daily.      ascorbic acid (VITAMIN C) 500 MG tablet Take 1 tablet (500 mg total) by mouth 2 (two) times daily. 60 tablet 1    aspirin EC 81 MG tablet Take 81 mg by mouth daily.      budesonide-formoterol (SYMBICORT) 160-4.5 MCG/ACT inhaler Inhale 2 puffs into the lungs 2 (two) times daily.      calcium carbonate (CALCIUM ANTACID) 500 MG chewable tablet Chew 2 tablets by mouth daily.      Cholecalciferol (VITAMIN D PO) Take 5,000 Units by mouth daily.      Cyanocobalamin (VITAMIN B-12 PO) Take 1,000 mcg by mouth daily.      feeding supplement  (ENSURE ENLIVE / ENSURE PLUS) LIQD Take 237 mLs by mouth 3 (three) times daily between meals. 237 mL 12    fluticasone (FLONASE) 50 MCG/ACT nasal spray Place 2 sprays into both nostrils at bedtime.      furosemide (LASIX) 20 MG tablet Take 20 mg by mouth daily. (Patient not taking: Reported on 06/16/2022)      leptospermum manuka honey (MEDIHONEY) PSTE paste Apply Medihoney to sacrum wound Q day, then cover with foam dressing.  Change foam dressing Q 3 days or PRN soiling Apply thin layer (3 mm) to wound. (Patient not taking: Reported on 06/16/2022) 44 mL 1    loperamide (IMODIUM A-D) 2 MG tablet Take 4 mg by mouth 2 (two) times daily.      metoprolol tartrate (LOPRESSOR) 25 MG tablet Take 1 tablet (25 mg total) by mouth 2 (two) times daily. 60 tablet 0    montelukast (SINGULAIR) 10 MG tablet Take 10 mg by mouth at bedtime.       Multiple Vitamins-Minerals (PRESERVISION AREDS PO) Take 1 capsule by mouth 2 (two) times daily.      omeprazole (PRILOSEC)  20 MG capsule Take 20 mg by mouth 2 (two) times daily before a meal.      Polyethyl Glycol-Propyl Glycol (SYSTANE) 0.4-0.3 % SOLN Apply 1 drop to eye 2 (two) times daily.      Sodium Chloride Flush (SALINE FLUSH) 0.9 % SOLN Use as directed (Patient not taking: Reported on 06/16/2022) 10 mL 0    traMADol (ULTRAM) 50 MG tablet Take 1 tablet (50 mg total) by mouth every 6 (six) hours as needed for moderate pain. (Patient not taking: Reported on 06/16/2022) 20 tablet 0    No current facility-administered medications for this encounter.   ROS  Review of Systems  Gastrointestinal:        RLQ ileostomy, flush and oval Peristomal lesion, raised  Skin:        Peristomal lesion  Psychiatric/Behavioral:  The patient is nervous/anxious.        Skin breakdown is painful  All other systems reviewed and are negative.  Vital signs:  BP (!) 123/54   Pulse 74   Temp 98.4 F (36.9 C)   Resp 18   Ht 4\' 9"  (1.448 m)   Wt 65.3 kg   SpO2 95%   BMI 31.16 kg/m   Exam:  Physical Exam Vitals reviewed.  Constitutional:      Appearance: Normal appearance.  Abdominal:     Palpations: Abdomen is soft.  Skin:    General: Skin is warm and dry.     Findings: Lesion present.  Neurological:     Mental Status: She is alert and oriented to person, place, and time.  Psychiatric:        Mood and Affect: Mood normal.        Behavior: Behavior normal.     Stoma type/location:  RLQ ileostomy Stomal assessment/size:  1 1/2" stoma with peristomal raised lesion Peristomal assessment:  lesion is somewhat flatter with silver hydrofiber. She refuses silver nitrate today due to pain with last application.  She would like to try silver for a bit longer and will consider silver nitrate application at next visit.   Treatment options for stomal/peristomal skin: Must have convex pouch due to flush stoma.   Output: liquid brown stool Ostomy pouching: 1pc. Soft convex Education provided:  Ongoing treatment with silver hydrofiber    Impression/dx  Peristomal breakdown Ileostomy complication Discussion  See above Plan  See back in 2 weeks    Visit time: 45 minutes.   Domenic Moras FNP-BC

## 2022-06-22 DIAGNOSIS — J439 Emphysema, unspecified: Secondary | ICD-10-CM | POA: Diagnosis not present

## 2022-06-22 DIAGNOSIS — M170 Bilateral primary osteoarthritis of knee: Secondary | ICD-10-CM | POA: Diagnosis not present

## 2022-06-22 DIAGNOSIS — B954 Other streptococcus as the cause of diseases classified elsewhere: Secondary | ICD-10-CM | POA: Diagnosis not present

## 2022-06-22 DIAGNOSIS — K651 Peritoneal abscess: Secondary | ICD-10-CM | POA: Diagnosis not present

## 2022-06-22 DIAGNOSIS — K9413 Enterostomy malfunction: Secondary | ICD-10-CM | POA: Insufficient documentation

## 2022-06-22 DIAGNOSIS — L89152 Pressure ulcer of sacral region, stage 2: Secondary | ICD-10-CM | POA: Diagnosis not present

## 2022-06-22 DIAGNOSIS — T8143XA Infection following a procedure, organ and space surgical site, initial encounter: Secondary | ICD-10-CM | POA: Diagnosis not present

## 2022-06-22 DIAGNOSIS — Z432 Encounter for attention to ileostomy: Secondary | ICD-10-CM | POA: Diagnosis not present

## 2022-06-25 ENCOUNTER — Ambulatory Visit (HOSPITAL_COMMUNITY)
Admission: RE | Admit: 2022-06-25 | Discharge: 2022-06-25 | Disposition: A | Discharge status: Home / Self Care | Payer: Medicare HMO | Source: Ambulatory Visit | Attending: Internal Medicine | Admitting: Internal Medicine

## 2022-06-25 DIAGNOSIS — Z932 Ileostomy status: Secondary | ICD-10-CM | POA: Insufficient documentation

## 2022-06-25 DIAGNOSIS — L24B3 Irritant contact dermatitis related to fecal or urinary stoma or fistula: Secondary | ICD-10-CM

## 2022-06-25 DIAGNOSIS — K56609 Unspecified intestinal obstruction, unspecified as to partial versus complete obstruction: Secondary | ICD-10-CM | POA: Diagnosis not present

## 2022-06-25 DIAGNOSIS — K631 Perforation of intestine (nontraumatic): Secondary | ICD-10-CM | POA: Diagnosis not present

## 2022-06-25 DIAGNOSIS — K5732 Diverticulitis of large intestine without perforation or abscess without bleeding: Secondary | ICD-10-CM | POA: Diagnosis not present

## 2022-06-25 DIAGNOSIS — Z432 Encounter for attention to ileostomy: Secondary | ICD-10-CM | POA: Diagnosis not present

## 2022-06-25 NOTE — Progress Notes (Signed)
[facility]   Reason for visit:  RLQ ileostomy with lesion to peristomal skin, unclear etiology.  Not responding to silver hydrofiber HPI:   Past Medical History:  Diagnosis Date   Anxiety    Arthritis    Asthma    DVT (deep venous thrombosis) (HCC)    Edema    feet   Emphysema of lung (HCC)    GERD (gastroesophageal reflux disease)    Headache    MIGRAINES   History of orthopnea    Hypercholesterolemia    Hypothyroidism    NODULES   Lymphedema    Migraine    Non-small cell lung cancer, right ([place]) 05/2019   Rad tx's   Shortness of breath dyspnea    WITH EXERTION   Sleep apnea    MILD, DOES NOT USE CPAP   Thyroid nodule    Family History  Problem Relation Age of Onset   Emphysema Mother    Heart Problems Mother    Tuberculosis Father    Brain cancer Father    Skin cancer Father    Breast cancer Sister    Irritable bowel syndrome Sister    Lung cancer Brother    Dementia Sister    Schizophrenia Sister    Allergies  Allergen Reactions   Eryc [Erythromycin]    Levofloxacin Nausea Only   Percocet [Oxycodone-Acetaminophen] Nausea And Vomiting   Augmentin [Amoxicillin-Pot Clavulanate] Rash   Celecoxib Palpitations   Current Outpatient Medications  Medication Sig Dispense Refill Last Dose   acetaminophen (TYLENOL) 500 MG tablet Take 1,000 mg by mouth daily as needed for pain.      apixaban (ELIQUIS) 5 MG TABS tablet Take 5 mg by mouth 2 (two) times daily.      ascorbic acid (VITAMIN C) 500 MG tablet Take 1 tablet (500 mg total) by mouth 2 (two) times daily. 60 tablet 1    aspirin EC 81 MG tablet Take 81 mg by mouth daily.      budesonide-formoterol (SYMBICORT) 160-4.5 MCG/ACT inhaler Inhale 2 puffs into the lungs 2 (two) times daily.      calcium carbonate (CALCIUM ANTACID) 500 MG chewable tablet Chew 2 tablets by mouth daily.      Cholecalciferol (VITAMIN D PO) Take 5,000 Units by mouth daily.      Cyanocobalamin (VITAMIN B-12 PO) Take 1,000 mcg  by mouth daily.      feeding supplement (ENSURE ENLIVE / ENSURE PLUS) LIQD Take 237 mLs by mouth 3 (three) times daily between meals. 237 mL 12    fluticasone (FLONASE) 50 MCG/ACT nasal spray Place 2 sprays into both nostrils at bedtime.      furosemide (LASIX) 20 MG tablet Take 20 mg by mouth daily. (Patient not taking: Reported on 06/16/2022)      leptospermum manuka honey (MEDIHONEY) PSTE paste Apply Medihoney to sacrum wound Q day, then cover with foam dressing.  Change foam dressing Q 3 days or PRN soiling Apply thin layer (3 mm) to wound. (Patient not taking: Reported on 06/16/2022) 44 mL 1    loperamide (IMODIUM A-D) 2 MG tablet Take 4 mg by mouth 2 (two) times daily.      metoprolol tartrate (LOPRESSOR) 25 MG tablet Take 1 tablet (25 mg total) by mouth 2 (two) times daily. 60 tablet 0    montelukast (SINGULAIR) 10 MG tablet Take 10 mg by mouth at bedtime.       Multiple Vitamins-Minerals (PRESERVISION AREDS PO) Take 1 capsule by mouth 2 (two)  times daily.      omeprazole (PRILOSEC) 20 MG capsule Take 20 mg by mouth 2 (two) times daily before a meal.      Polyethyl Glycol-Propyl Glycol (SYSTANE) 0.4-0.3 % SOLN Apply 1 drop to eye 2 (two) times daily.      Sodium Chloride Flush (SALINE FLUSH) 0.9 % SOLN Use as directed (Patient not taking: Reported on 06/16/2022) 10 mL 0    traMADol (ULTRAM) 50 MG tablet Take 1 tablet (50 mg total) by mouth every 6 (six) hours as needed for moderate pain. (Patient not taking: Reported on 06/16/2022) 20 tablet 0    No current facility-administered medications for this encounter.   ROS  Review of Systems  Gastrointestinal:        RLQ ileostomy Peristomal lesion, unclear etiology  Skin:  Positive for color change and wound.       Peristomal skin irritation Nonintact lesion at 9 o'clock.  Unclear etiology  continue silver hydrofiber  All other systems reviewed and are negative.  Vital signs:  BP (!) 118/52 (BP Location: Right Arm)   Pulse 69   Temp 97.9 F  (36.6 C) (Oral)   Resp 20   SpO2 98%  Exam:  Physical Exam Vitals reviewed.  Constitutional:      Appearance: Normal appearance.  Abdominal:     Palpations: Abdomen is soft.  Skin:    General: Skin is warm and dry.     Findings: Lesion and rash present.  Neurological:     Mental Status: She is alert and oriented to person, place, and time.  Psychiatric:        Mood and Affect: Mood normal.        Behavior: Behavior normal.     Stoma type/location:  RLQ ileostomy Stomal assessment/size:  1 3/8" pink and moist   Peristomal assessment:  lesion at 9 o'clock, tender to touch Treatment options for stomal/peristomal skin: silver hydrofiber to open wound, cover with barrier ring Output: soft brown stool Ostomy pouching: 1pc.convex with barrier ring  silver hydrofiber to open wound.  Education provided:  Due to limited improvement with aquacel silver hydrofiber, I am hesitant to apply silver nitrate.  It is exquisitely painful to her and yielded little change at last application.  History fistulas.  Has upcoming appointment with surgeon next week    Impression/dx  Contact dermatitis Discussion  Continue same pouch and silver hydrofiber.  Plan  Follow up with surgery team.  Back in clinic in a week to assess wound    Visit time: 45 minutes.   Maple Hudson FNP-BC

## 2022-06-28 NOTE — Discharge Instructions (Signed)
Follow up with surgery team to determine cause of lesion Not responding to silver hydrofiber, offloading pressure point History fistula.

## 2022-07-01 ENCOUNTER — Other Ambulatory Visit: Payer: Self-pay | Admitting: General Surgery

## 2022-07-01 DIAGNOSIS — Z932 Ileostomy status: Secondary | ICD-10-CM | POA: Diagnosis not present

## 2022-07-01 DIAGNOSIS — K651 Peritoneal abscess: Secondary | ICD-10-CM

## 2022-07-09 ENCOUNTER — Ambulatory Visit (HOSPITAL_COMMUNITY): Payer: Medicare HMO | Admitting: Nurse Practitioner

## 2022-07-12 ENCOUNTER — Ambulatory Visit
Admission: RE | Admit: 2022-07-12 | Discharge: 2022-07-12 | Disposition: A | Discharge status: Home / Self Care | Payer: Medicare HMO | Source: Ambulatory Visit | Attending: General Surgery | Admitting: General Surgery

## 2022-07-12 DIAGNOSIS — K651 Peritoneal abscess: Secondary | ICD-10-CM | POA: Diagnosis not present

## 2022-07-12 DIAGNOSIS — Z932 Ileostomy status: Secondary | ICD-10-CM

## 2022-07-12 DIAGNOSIS — I700 Atherosclerosis of aorta: Secondary | ICD-10-CM | POA: Diagnosis not present

## 2022-07-12 MED ORDER — IOHEXOL 300 MG/ML  SOLN
100.0000 mL | Freq: Once | INTRAMUSCULAR | Status: AC | PRN
  Administered 2022-07-12: 100 mL via INTRAVENOUS

## 2022-07-16 ENCOUNTER — Ambulatory Visit (HOSPITAL_COMMUNITY)
Admission: RE | Admit: 2022-07-16 | Discharge: 2022-07-16 | Disposition: A | Discharge status: Home / Self Care | Payer: Medicare HMO | Source: Ambulatory Visit | Attending: Nurse Practitioner | Admitting: Nurse Practitioner

## 2022-07-16 DIAGNOSIS — K9419 Other complications of enterostomy: Secondary | ICD-10-CM | POA: Diagnosis not present

## 2022-07-16 DIAGNOSIS — L24B3 Irritant contact dermatitis related to fecal or urinary stoma or fistula: Secondary | ICD-10-CM

## 2022-07-16 DIAGNOSIS — Z932 Ileostomy status: Secondary | ICD-10-CM | POA: Insufficient documentation

## 2022-07-16 DIAGNOSIS — T8131XD Disruption of external operation (surgical) wound, not elsewhere classified, subsequent encounter: Secondary | ICD-10-CM | POA: Diagnosis not present

## 2022-07-16 NOTE — Progress Notes (Signed)
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   Rad tx's   Shortness of breath dyspnea    WITH EXERTION   Sleep apnea    MILD, DOES NOT USE CPAP   Thyroid nodule    Family History  Problem Relation Age of Onset   Emphysema Mother    Heart Problems Mother    Tuberculosis Father    Brain cancer Father    Skin cancer Father    Breast cancer Sister    Irritable bowel syndrome Sister    Lung cancer Brother    Dementia Sister    Schizophrenia Sister    Allergies  Allergen Reactions   Eryc [Erythromycin]    Levofloxacin Nausea Only   Percocet [Oxycodone-Acetaminophen] Nausea And Vomiting   Augmentin [Amoxicillin-Pot Clavulanate] Rash   Celecoxib Palpitations   Current Outpatient Medications  Medication Sig Dispense Refill Last Dose   acetaminophen (TYLENOL) 500 MG tablet Take 1,000 mg by mouth daily as needed for pain.      apixaban (ELIQUIS) 5 MG TABS tablet Take 5 mg by mouth 2 (two) times daily.      ascorbic acid (VITAMIN C) 500 MG tablet Take 1 tablet (500 mg total) by mouth 2 (two) times daily. 60 tablet 1    aspirin EC 81 MG tablet Take 81 mg by mouth daily.      budesonide-formoterol (SYMBICORT) 160-4.5 MCG/ACT inhaler Inhale 2 puffs into the lungs 2 (two) times daily.      calcium carbonate (CALCIUM ANTACID) 500 MG chewable tablet Chew 2 tablets by mouth daily.      Cholecalciferol (VITAMIN D PO) Take 5,000 Units by mouth daily.      Cyanocobalamin (VITAMIN B-12 PO) Take 1,000 mcg by mouth daily.      feeding supplement (ENSURE ENLIVE / ENSURE PLUS)  LIQD Take 237 mLs by mouth 3 (three) times daily between meals. 237 mL 12    fluticasone (FLONASE) 50 MCG/ACT nasal spray Place 2 sprays into both nostrils at bedtime.      furosemide (LASIX) 20 MG tablet Take 20 mg by mouth daily. (Patient not taking: Reported on 06/16/2022)      leptospermum manuka honey (MEDIHONEY) PSTE paste Apply Medihoney to sacrum wound Q day, then cover with foam dressing.  Change foam dressing Q 3 days or PRN soiling Apply thin layer (3 mm) to wound. (Patient not taking: Reported on 06/16/2022) 44 mL 1    loperamide (IMODIUM A-D) 2 MG tablet Take 4 mg by mouth 2 (two) times daily.      metoprolol tartrate (LOPRESSOR) 25 MG tablet Take 1 tablet (25 mg total) by mouth 2 (two) times daily. 60 tablet 0    montelukast (SINGULAIR) 10 MG tablet Take 10 mg by mouth at bedtime.       Multiple Vitamins-Minerals (PRESERVISION AREDS PO) Take 1 capsule by mouth 2 (two) times daily.      omeprazole (PRILOSEC) 20 MG capsule  Take 20 mg by mouth 2 (two) times daily before a meal.      Polyethyl Glycol-Propyl Glycol (SYSTANE) 0.4-0.3 % SOLN Apply 1 drop to eye 2 (two) times daily.      Sodium Chloride Flush (SALINE FLUSH) 0.9 % SOLN Use as directed (Patient not taking: Reported on 06/16/2022) 10 mL 0    traMADol (ULTRAM) 50 MG tablet Take 1 tablet (50 mg total) by mouth every 6 (six) hours as needed for moderate pain. (Patient not taking: Reported on 06/16/2022) 20 tablet 0    No current facility-administered medications for this encounter.   ROS  Review of Systems Vital signs:  BP (!) 119/45 (BP Location: Right Arm)   Pulse 65   Temp 98 F (36.7 C) (Oral)   Resp 20   SpO2 97%  Exam:  Physical Exam  Stoma type/location:  RLQ ileostomy Stomal assessment/size:  1 1/4"  Peristomal assessment:  nonhealing lesion to periwound.  New epithelialization noted to wound edge.  Continue aquacel  Treatment options for stomal/peristomal skin: REcent CT scan  awaiting results Stoma powder skin  prep, barrier ring over aquacel   Output: soft brown stool Ostomy pouching: 1pc. Convex   Education provided:  Continue aquacel since we have seen improvement.  Awaiting CT results.     Impression/dx  Ileostomy Wound unknown etiology History abscess, fistula Discussion  See back in 2 weeks Plan  Follow up with surgery team for CT results.     Visit time: 40 minutes.   Domenic Moras FNP-BC

## 2022-07-19 DIAGNOSIS — K5732 Diverticulitis of large intestine without perforation or abscess without bleeding: Secondary | ICD-10-CM | POA: Diagnosis not present

## 2022-07-19 DIAGNOSIS — K56609 Unspecified intestinal obstruction, unspecified as to partial versus complete obstruction: Secondary | ICD-10-CM | POA: Diagnosis not present

## 2022-07-19 DIAGNOSIS — Z932 Ileostomy status: Secondary | ICD-10-CM | POA: Diagnosis not present

## 2022-07-19 DIAGNOSIS — K631 Perforation of intestine (nontraumatic): Secondary | ICD-10-CM | POA: Diagnosis not present

## 2022-07-20 DIAGNOSIS — T8131XA Disruption of external operation (surgical) wound, not elsewhere classified, initial encounter: Secondary | ICD-10-CM | POA: Insufficient documentation

## 2022-07-20 DIAGNOSIS — K9419 Other complications of enterostomy: Secondary | ICD-10-CM | POA: Insufficient documentation

## 2022-07-20 NOTE — Discharge Instructions (Signed)
Continue aquacel AG to open wound on periwound

## 2022-07-21 ENCOUNTER — Other Ambulatory Visit (HOSPITAL_COMMUNITY): Payer: Self-pay | Admitting: Nurse Practitioner

## 2022-07-21 DIAGNOSIS — Z432 Encounter for attention to ileostomy: Secondary | ICD-10-CM

## 2022-07-23 DIAGNOSIS — K5732 Diverticulitis of large intestine without perforation or abscess without bleeding: Secondary | ICD-10-CM | POA: Diagnosis not present

## 2022-07-23 DIAGNOSIS — K631 Perforation of intestine (nontraumatic): Secondary | ICD-10-CM | POA: Diagnosis not present

## 2022-07-23 DIAGNOSIS — K56609 Unspecified intestinal obstruction, unspecified as to partial versus complete obstruction: Secondary | ICD-10-CM | POA: Diagnosis not present

## 2022-07-23 DIAGNOSIS — Z932 Ileostomy status: Secondary | ICD-10-CM | POA: Diagnosis not present

## 2022-07-27 DIAGNOSIS — H903 Sensorineural hearing loss, bilateral: Secondary | ICD-10-CM | POA: Diagnosis not present

## 2022-07-30 ENCOUNTER — Ambulatory Visit (HOSPITAL_COMMUNITY)
Admission: RE | Admit: 2022-07-30 | Discharge: 2022-07-30 | Disposition: A | Discharge status: Home / Self Care | Payer: Medicare HMO | Source: Ambulatory Visit | Attending: Internal Medicine | Admitting: Internal Medicine

## 2022-07-30 DIAGNOSIS — Z432 Encounter for attention to ileostomy: Secondary | ICD-10-CM | POA: Diagnosis not present

## 2022-07-30 DIAGNOSIS — Z4801 Encounter for change or removal of surgical wound dressing: Secondary | ICD-10-CM | POA: Insufficient documentation

## 2022-07-30 DIAGNOSIS — L98499 Non-pressure chronic ulcer of skin of other sites with unspecified severity: Secondary | ICD-10-CM

## 2022-07-30 NOTE — Progress Notes (Signed)
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   Rad tx's   Shortness of breath dyspnea    WITH EXERTION   Sleep apnea    MILD, DOES NOT USE CPAP   Thyroid nodule    Family History  Problem Relation Age of Onset   Emphysema Mother    Heart Problems Mother    Tuberculosis Father    Brain cancer Father    Skin cancer Father    Breast cancer Sister    Irritable bowel syndrome Sister    Lung cancer Brother    Dementia Sister    Schizophrenia Sister    Allergies  Allergen Reactions   Eryc [Erythromycin]    Levofloxacin Nausea Only   Percocet [Oxycodone-Acetaminophen] Nausea And Vomiting   Augmentin [Amoxicillin-Pot Clavulanate] Rash   Celecoxib Palpitations   Current Outpatient Medications  Medication Sig Dispense Refill Last Dose   acetaminophen (TYLENOL) 500 MG tablet Take 1,000 mg by mouth daily as needed for pain.      apixaban (ELIQUIS) 5 MG TABS tablet Take 5 mg by mouth 2 (two) times daily.      ascorbic acid (VITAMIN C) 500 MG tablet Take 1 tablet (500 mg total) by mouth 2 (two) times daily. 60 tablet 1    aspirin EC 81 MG tablet Take 81 mg by mouth daily.      budesonide-formoterol (SYMBICORT) 160-4.5 MCG/ACT inhaler Inhale 2 puffs into the lungs 2 (two) times daily.      calcium carbonate (CALCIUM ANTACID) 500 MG chewable tablet Chew 2 tablets by mouth daily.      Cholecalciferol (VITAMIN D PO) Take 5,000 Units by mouth daily.      Cyanocobalamin (VITAMIN B-12 PO) Take 1,000 mcg by mouth daily.      feeding supplement (ENSURE ENLIVE / ENSURE PLUS)  LIQD Take 237 mLs by mouth 3 (three) times daily between meals. 237 mL 12    fluticasone (FLONASE) 50 MCG/ACT nasal spray Place 2 sprays into both nostrils at bedtime.      furosemide (LASIX) 20 MG tablet Take 20 mg by mouth daily. (Patient not taking: Reported on 06/16/2022)      leptospermum manuka honey (MEDIHONEY) PSTE paste Apply Medihoney to sacrum wound Q day, then cover with foam dressing.  Change foam dressing Q 3 days or PRN soiling Apply thin layer (3 mm) to wound. (Patient not taking: Reported on 06/16/2022) 44 mL 1    loperamide (IMODIUM A-D) 2 MG tablet Take 4 mg by mouth 2 (two) times daily.      metoprolol tartrate (LOPRESSOR) 25 MG tablet Take 1 tablet (25 mg total) by mouth 2 (two) times daily. 60 tablet 0    montelukast (SINGULAIR) 10 MG tablet Take 10 mg by mouth at bedtime.       Multiple Vitamins-Minerals (PRESERVISION AREDS PO) Take 1 capsule by mouth 2 (two) times daily.      omeprazole (PRILOSEC) 20 MG capsule  Take 20 mg by mouth 2 (two) times daily before a meal.      Polyethyl Glycol-Propyl Glycol (SYSTANE) 0.4-0.3 % SOLN Apply 1 drop to eye 2 (two) times daily.      Sodium Chloride Flush (SALINE FLUSH) 0.9 % SOLN Use as directed (Patient not taking: Reported on 06/16/2022) 10 mL 0    traMADol (ULTRAM) 50 MG tablet Take 1 tablet (50 mg total) by mouth every 6 (six) hours as needed for moderate pain. (Patient not taking: Reported on 06/16/2022) 20 tablet 0    No current facility-administered medications for this encounter.   ROS  Review of Systems  Gastrointestinal:        RLQ ileostomy  Skin:  Positive for wound.       Peristomal breakdown  Psychiatric/Behavioral: Negative.    All other systems reviewed and are negative.  Vital signs:  BP 129/67 (BP Location: Right Arm)   Pulse 72   Temp 97.8 F (36.6 C) (Oral)   Resp 16   SpO2 97%  Exam:  Physical Exam Vitals reviewed.  Constitutional:      Appearance: Normal appearance.  Abdominal:     Palpations: Abdomen  is soft.  Skin:    General: Skin is warm and dry.     Findings: Lesion present.  Neurological:     General: No focal deficit present.     Mental Status: She is alert and oriented to person, place, and time.  Psychiatric:        Mood and Affect: Mood normal.        Behavior: Behavior normal.     Stoma type/location:  RLQ ileostomy Stomal assessment/size:  1 1/4" flush oval stoma Peristomal assessment:  nonhealing lesion, continues to improve Treatment options for stomal/peristomal skin: aquacel to lesion, stoma powder and skin prep,  barrier ring Output: liquid brown stool Ostomy pouching: 1pc.convex.  Education provided:  continue aquacel     Impression/dx  Ileostomy Nonhealing wound Discussion  See back 3 weeks Plan  Continue aquacel to lesion    Visit time: 40 minutes.   Lindsey Moras FNP-BC

## 2022-08-02 DIAGNOSIS — L98499 Non-pressure chronic ulcer of skin of other sites with unspecified severity: Secondary | ICD-10-CM | POA: Insufficient documentation

## 2022-08-02 NOTE — Discharge Instructions (Signed)
See back 3 weeks

## 2022-08-09 DIAGNOSIS — H903 Sensorineural hearing loss, bilateral: Secondary | ICD-10-CM | POA: Diagnosis not present

## 2022-08-16 DIAGNOSIS — Z1231 Encounter for screening mammogram for malignant neoplasm of breast: Secondary | ICD-10-CM | POA: Diagnosis not present

## 2022-08-16 DIAGNOSIS — Z79899 Other long term (current) drug therapy: Secondary | ICD-10-CM | POA: Diagnosis not present

## 2022-08-16 DIAGNOSIS — I890 Lymphedema, not elsewhere classified: Secondary | ICD-10-CM | POA: Diagnosis not present

## 2022-08-16 DIAGNOSIS — Z0000 Encounter for general adult medical examination without abnormal findings: Secondary | ICD-10-CM | POA: Diagnosis not present

## 2022-08-16 DIAGNOSIS — E785 Hyperlipidemia, unspecified: Secondary | ICD-10-CM | POA: Diagnosis not present

## 2022-08-17 ENCOUNTER — Ambulatory Visit (HOSPITAL_COMMUNITY)
Admission: RE | Admit: 2022-08-17 | Discharge: 2022-08-17 | Disposition: A | Discharge status: Home / Self Care | Payer: Medicare HMO | Source: Ambulatory Visit | Attending: Internal Medicine | Admitting: Internal Medicine

## 2022-08-17 ENCOUNTER — Other Ambulatory Visit: Payer: Self-pay | Admitting: Internal Medicine

## 2022-08-17 DIAGNOSIS — Z1231 Encounter for screening mammogram for malignant neoplasm of breast: Secondary | ICD-10-CM

## 2022-08-17 DIAGNOSIS — L24B3 Irritant contact dermatitis related to fecal or urinary stoma or fistula: Secondary | ICD-10-CM

## 2022-08-17 DIAGNOSIS — Z432 Encounter for attention to ileostomy: Secondary | ICD-10-CM | POA: Insufficient documentation

## 2022-08-17 NOTE — Progress Notes (Signed)
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   Rad tx's   Shortness of breath dyspnea    WITH EXERTION   Sleep apnea    MILD, DOES NOT USE CPAP   Thyroid nodule    Family History  Problem Relation Age of Onset   Emphysema Mother    Heart Problems Mother    Tuberculosis Father    Brain cancer Father    Skin cancer Father    Breast cancer Sister    Irritable bowel syndrome Sister    Lung cancer Brother    Dementia Sister    Schizophrenia Sister    Allergies  Allergen Reactions   Eryc [Erythromycin]    Levofloxacin Nausea Only   Percocet [Oxycodone-Acetaminophen] Nausea And Vomiting   Augmentin [Amoxicillin-Pot Clavulanate] Rash   Celecoxib Palpitations   Current Outpatient Medications  Medication Sig Dispense Refill Last Dose   acetaminophen (TYLENOL) 500 MG tablet Take 1,000 mg by mouth daily as needed for pain.      apixaban (ELIQUIS) 5 MG TABS tablet Take 5 mg by mouth 2 (two) times daily.      ascorbic acid (VITAMIN C) 500 MG tablet Take 1 tablet (500 mg total) by mouth 2 (two) times daily. 60 tablet 1    aspirin EC 81 MG tablet Take 81 mg by mouth daily.      budesonide-formoterol (SYMBICORT) 160-4.5 MCG/ACT inhaler Inhale 2 puffs into the lungs 2 (two) times daily.      calcium carbonate (CALCIUM ANTACID) 500 MG chewable tablet Chew 2 tablets by mouth daily.      Cholecalciferol (VITAMIN D PO) Take 5,000 Units by mouth daily.      Cyanocobalamin (VITAMIN B-12 PO) Take 1,000 mcg by mouth daily.      feeding supplement (ENSURE ENLIVE / ENSURE PLUS)  LIQD Take 237 mLs by mouth 3 (three) times daily between meals. 237 mL 12    fluticasone (FLONASE) 50 MCG/ACT nasal spray Place 2 sprays into both nostrils at bedtime.      furosemide (LASIX) 20 MG tablet Take 20 mg by mouth daily. (Patient not taking: Reported on 06/16/2022)      leptospermum manuka honey (MEDIHONEY) PSTE paste Apply Medihoney to sacrum wound Q day, then cover with foam dressing.  Change foam dressing Q 3 days or PRN soiling Apply thin layer (3 mm) to wound. (Patient not taking: Reported on 06/16/2022) 44 mL 1    loperamide (IMODIUM A-D) 2 MG tablet Take 4 mg by mouth 2 (two) times daily.      metoprolol tartrate (LOPRESSOR) 25 MG tablet Take 1 tablet (25 mg total) by mouth 2 (two) times daily. 60 tablet 0    montelukast (SINGULAIR) 10 MG tablet Take 10 mg by mouth at bedtime.       Multiple Vitamins-Minerals (PRESERVISION AREDS PO) Take 1 capsule by mouth 2 (two) times daily.      omeprazole (PRILOSEC) 20 MG capsule  Take 20 mg by mouth 2 (two) times daily before a meal.      Polyethyl Glycol-Propyl Glycol (SYSTANE) 0.4-0.3 % SOLN Apply 1 drop to eye 2 (two) times daily.      Sodium Chloride Flush (SALINE FLUSH) 0.9 % SOLN Use as directed (Patient not taking: Reported on 06/16/2022) 10 mL 0    traMADol (ULTRAM) 50 MG tablet Take 1 tablet (50 mg total) by mouth every 6 (six) hours as needed for moderate pain. (Patient not taking: Reported on 06/16/2022) 20 tablet 0    No current facility-administered medications for this encounter.   ROS  Review of Systems  Gastrointestinal:        Ileostomy  Skin:  Positive for rash and wound.  Psychiatric/Behavioral: Negative.    All other systems reviewed and are negative.  Vital signs:  BP (!) 122/50 (BP Location: Right Arm)   Pulse 64   Temp 98 F (36.7 C) (Oral)   Resp 18   SpO2 90%  Exam:  Physical Exam Vitals reviewed.  Constitutional:      Appearance: Normal appearance.  Abdominal:     Palpations: Abdomen is soft.  Skin:     General: Skin is warm and dry.     Findings: Lesion present.  Neurological:     Mental Status: She is alert and oriented to person, place, and time.  Psychiatric:        Mood and Affect: Mood normal.        Behavior: Behavior normal.     Stoma type/location:  RLQ ileostomy with peristomal breakdown, chronic but improving Stomal assessment/size:  1 1/4"  Peristomal assessment:  lesion continues to slowly improve with silver hydrofiber Treatment options for stomal/peristomal skin: 1 piece convex with Eakin seal  Output: soft brown stool Ostomy pouching: 1pc.convex with Eakin ring and Aquacel   Education provided:  continue same pouching    Impression/dx  ileostomy Discussion  See back as needed Plan  See back as needed    Visit time: 44 minutes.   Domenic Moras FNP-BC

## 2022-08-18 DIAGNOSIS — K631 Perforation of intestine (nontraumatic): Secondary | ICD-10-CM | POA: Diagnosis not present

## 2022-08-18 DIAGNOSIS — Z932 Ileostomy status: Secondary | ICD-10-CM | POA: Diagnosis not present

## 2022-08-18 DIAGNOSIS — K5732 Diverticulitis of large intestine without perforation or abscess without bleeding: Secondary | ICD-10-CM | POA: Diagnosis not present

## 2022-08-18 DIAGNOSIS — K56609 Unspecified intestinal obstruction, unspecified as to partial versus complete obstruction: Secondary | ICD-10-CM | POA: Diagnosis not present

## 2022-08-19 DIAGNOSIS — L02211 Cutaneous abscess of abdominal wall: Secondary | ICD-10-CM | POA: Diagnosis not present

## 2022-08-20 ENCOUNTER — Encounter: Payer: Self-pay | Admitting: Urology

## 2022-08-20 NOTE — Discharge Instructions (Signed)
See back as needed.  

## 2022-08-24 ENCOUNTER — Other Ambulatory Visit: Payer: Self-pay | Admitting: General Surgery

## 2022-08-24 DIAGNOSIS — L02211 Cutaneous abscess of abdominal wall: Secondary | ICD-10-CM

## 2022-08-25 ENCOUNTER — Ambulatory Visit
Admission: RE | Admit: 2022-08-25 | Discharge: 2022-08-25 | Disposition: A | Discharge status: Home / Self Care | Payer: Medicare HMO | Source: Ambulatory Visit | Attending: General Surgery | Admitting: General Surgery

## 2022-08-25 DIAGNOSIS — L02211 Cutaneous abscess of abdominal wall: Secondary | ICD-10-CM

## 2022-08-25 DIAGNOSIS — I700 Atherosclerosis of aorta: Secondary | ICD-10-CM | POA: Diagnosis not present

## 2022-08-25 MED ORDER — IOHEXOL 300 MG/ML  SOLN
100.0000 mL | Freq: Once | INTRAMUSCULAR | Status: AC | PRN
  Administered 2022-08-25: 100 mL via INTRAVENOUS

## 2022-09-15 DIAGNOSIS — K5732 Diverticulitis of large intestine without perforation or abscess without bleeding: Secondary | ICD-10-CM | POA: Diagnosis not present

## 2022-09-15 DIAGNOSIS — K631 Perforation of intestine (nontraumatic): Secondary | ICD-10-CM | POA: Diagnosis not present

## 2022-09-15 DIAGNOSIS — K56609 Unspecified intestinal obstruction, unspecified as to partial versus complete obstruction: Secondary | ICD-10-CM | POA: Diagnosis not present

## 2022-09-15 DIAGNOSIS — Z932 Ileostomy status: Secondary | ICD-10-CM | POA: Diagnosis not present

## 2022-09-21 ENCOUNTER — Ambulatory Visit (HOSPITAL_COMMUNITY)
Admission: RE | Admit: 2022-09-21 | Discharge: 2022-09-21 | Disposition: A | Discharge status: Home / Self Care | Payer: Medicare HMO | Source: Ambulatory Visit | Attending: Nurse Practitioner | Admitting: Nurse Practitioner

## 2022-09-21 DIAGNOSIS — L24B3 Irritant contact dermatitis related to fecal or urinary stoma or fistula: Secondary | ICD-10-CM

## 2022-09-21 DIAGNOSIS — K5780 Diverticulitis of intestine, part unspecified, with perforation and abscess without bleeding: Secondary | ICD-10-CM | POA: Diagnosis not present

## 2022-09-21 DIAGNOSIS — K9413 Enterostomy malfunction: Secondary | ICD-10-CM

## 2022-09-21 DIAGNOSIS — Z432 Encounter for attention to ileostomy: Secondary | ICD-10-CM | POA: Insufficient documentation

## 2022-09-21 DIAGNOSIS — Z932 Ileostomy status: Secondary | ICD-10-CM | POA: Diagnosis present

## 2022-09-21 NOTE — Progress Notes (Signed)
[facility]   Reason for visit:  RLQ ileostomy HPI:  Perforated diverticulum with abdominal abscess, ileostomy Past Medical History:  Diagnosis Date  . Anxiety   . Arthritis   . Asthma   . DVT (deep venous thrombosis) (HCC)   . Edema    feet  . Emphysema of lung (HCC)   . GERD (gastroesophageal reflux disease)   . Headache    MIGRAINES  . History of orthopnea   . Hypercholesterolemia   . Hypothyroidism    NODULES  . Lymphedema   . Migraine   . Non-small cell lung cancer, 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   Rad tx's  . Shortness of breath dyspnea    WITH EXERTION  . Sleep apnea    MILD, DOES NOT USE CPAP  . Thyroid nodule    Family History  Problem Relation Age of Onset  . Emphysema Mother   . Heart Problems Mother   . Tuberculosis Father   . Brain cancer Father   . Skin cancer Father   . Breast cancer Sister   . Irritable bowel syndrome Sister   . Lung cancer Brother   . Dementia Sister   . Schizophrenia Sister    Allergies  Allergen Reactions  . Eryc [Erythromycin]   . Levofloxacin Nausea Only  . Percocet [Oxycodone-Acetaminophen] Nausea And Vomiting  . Augmentin [Amoxicillin-Pot Clavulanate] Rash  . Celecoxib Palpitations   Current Outpatient Medications  Medication Sig Dispense Refill Last Dose  . acetaminophen (TYLENOL) 500 MG tablet Take 1,000 mg by mouth daily as needed for pain.     Marland Kitchen apixaban (ELIQUIS) 5 MG TABS tablet Take 5 mg by mouth 2 (two) times daily.     Marland Kitchen ascorbic acid (VITAMIN C) 500 MG tablet Take 1 tablet (500 mg total) by mouth 2 (two) times daily. 60 tablet 1   . aspirin EC 81 MG tablet Take 81 mg by mouth daily.     . budesonide-formoterol (SYMBICORT) 160-4.5 MCG/ACT inhaler Inhale 2 puffs into the lungs 2 (two) times daily.     . calcium carbonate (CALCIUM ANTACID) 500 MG chewable tablet Chew 2 tablets by mouth daily.     . Cholecalciferol (VITAMIN D PO) Take 5,000 Units by mouth daily.     . Cyanocobalamin (VITAMIN B-12 PO) Take  1,000 mcg by mouth daily.     . feeding supplement (ENSURE ENLIVE / ENSURE PLUS) LIQD Take 237 mLs by mouth 3 (three) times daily between meals. 237 mL 12   . fluticasone (FLONASE) 50 MCG/ACT nasal spray Place 2 sprays into both nostrils at bedtime.     . furosemide (LASIX) 20 MG tablet Take 20 mg by mouth daily. (Patient not taking: Reported on 06/16/2022)     . leptospermum manuka honey (MEDIHONEY) PSTE paste Apply Medihoney to sacrum wound Q day, then cover with foam dressing.  Change foam dressing Q 3 days or PRN soiling Apply thin layer (3 mm) to wound. (Patient not taking: Reported on 06/16/2022) 44 mL 1   . loperamide (IMODIUM A-D) 2 MG tablet Take 4 mg by mouth 2 (two) times daily.     . metoprolol tartrate (LOPRESSOR) 25 MG tablet Take 1 tablet (25 mg total) by mouth 2 (two) times daily. 60 tablet 0   . montelukast (SINGULAIR) 10 MG tablet Take 10 mg by mouth at bedtime.      . Multiple Vitamins-Minerals (PRESERVISION AREDS PO) Take 1 capsule by mouth 2 (two) times daily.     Marland Kitchen omeprazole (  PRILOSEC) 20 MG capsule Take 20 mg by mouth 2 (two) times daily before a meal.     . Polyethyl Glycol-Propyl Glycol (SYSTANE) 0.4-0.3 % SOLN Apply 1 drop to eye 2 (two) times daily.     . Sodium Chloride Flush (SALINE FLUSH) 0.9 % SOLN Use as directed (Patient not taking: Reported on 06/16/2022) 10 mL 0   . traMADol (ULTRAM) 50 MG tablet Take 1 tablet (50 mg total) by mouth every 6 (six) hours as needed for moderate pain. (Patient not taking: Reported on 06/16/2022) 20 tablet 0    No current facility-administered medications for this encounter.   ROS  Review of Systems  Constitutional:  Positive for fatigue.  Gastrointestinal:        RLQ ileostomy  Skin:  Positive for rash and wound.       Peristomal breakdown  All other systems reviewed and are negative. Vital signs:  BP (!) 126/51 (BP Location: Right Arm)   Pulse 75   Temp 97.6 F (36.4 C) (Oral)   Resp 17   SpO2 93%  Exam:  Physical  Exam Vitals reviewed.  Constitutional:      Appearance: Normal appearance.  Abdominal:     Palpations: Abdomen is soft.  Skin:    General: Skin is warm and dry.     Findings: Lesion present.  Neurological:     Mental Status: She is alert and oriented to person, place, and time.  Psychiatric:        Mood and Affect: Mood normal.        Behavior: Behavior normal.    Stoma type/location:  RLQ ileostomy Stomal assessment/size:  1 1/4" flush Peristomal assessment:  nonintact lesion at 9 o'clock, improving with silver hydrofiber and Eakin seal Treatment options for stomal/peristomal skin: flexible convex , eakin ring, aquacel to open wound wound.  Ostomy belt and barrier seals to perimeter.  Output: soft brown stool Ostomy pouching: 1pc. Convex with ostomy belt Education provided:  continue aquacel and pouching as before.  Continues to get 2-3 days wear time with occasional unplanned leaks (mainly at night while sleeping)    Impression/dx  ileostomy Discussion  Continue wound care, pouching as before  Plan  See back as needed  no issues with supplies at this time.     Visit time: 45 minutes.   Maple Hudson FNP-BC

## 2022-09-29 ENCOUNTER — Ambulatory Visit
Admission: RE | Admit: 2022-09-29 | Discharge: 2022-09-29 | Disposition: A | Discharge status: Home / Self Care | Payer: Medicare HMO | Source: Ambulatory Visit | Attending: Internal Medicine | Admitting: Internal Medicine

## 2022-09-29 DIAGNOSIS — Z1231 Encounter for screening mammogram for malignant neoplasm of breast: Secondary | ICD-10-CM | POA: Diagnosis not present

## 2022-10-18 DIAGNOSIS — I890 Lymphedema, not elsewhere classified: Secondary | ICD-10-CM | POA: Diagnosis not present

## 2022-10-18 DIAGNOSIS — E7800 Pure hypercholesterolemia, unspecified: Secondary | ICD-10-CM | POA: Diagnosis not present

## 2022-10-18 DIAGNOSIS — Z79899 Other long term (current) drug therapy: Secondary | ICD-10-CM | POA: Diagnosis not present

## 2022-10-18 DIAGNOSIS — Z6831 Body mass index (BMI) 31.0-31.9, adult: Secondary | ICD-10-CM | POA: Diagnosis not present

## 2022-10-22 DIAGNOSIS — Z932 Ileostomy status: Secondary | ICD-10-CM | POA: Diagnosis not present

## 2022-10-22 DIAGNOSIS — K5732 Diverticulitis of large intestine without perforation or abscess without bleeding: Secondary | ICD-10-CM | POA: Diagnosis not present

## 2022-10-22 DIAGNOSIS — K631 Perforation of intestine (nontraumatic): Secondary | ICD-10-CM | POA: Diagnosis not present

## 2022-10-22 DIAGNOSIS — K56609 Unspecified intestinal obstruction, unspecified as to partial versus complete obstruction: Secondary | ICD-10-CM | POA: Diagnosis not present

## 2022-11-03 DIAGNOSIS — N1831 Chronic kidney disease, stage 3a: Secondary | ICD-10-CM | POA: Diagnosis not present

## 2022-11-03 DIAGNOSIS — Z79899 Other long term (current) drug therapy: Secondary | ICD-10-CM | POA: Diagnosis not present

## 2022-11-03 DIAGNOSIS — R944 Abnormal results of kidney function studies: Secondary | ICD-10-CM | POA: Diagnosis not present

## 2022-11-15 DIAGNOSIS — K631 Perforation of intestine (nontraumatic): Secondary | ICD-10-CM | POA: Diagnosis not present

## 2022-11-15 DIAGNOSIS — K5732 Diverticulitis of large intestine without perforation or abscess without bleeding: Secondary | ICD-10-CM | POA: Diagnosis not present

## 2022-11-15 DIAGNOSIS — Z932 Ileostomy status: Secondary | ICD-10-CM | POA: Diagnosis not present

## 2022-11-15 DIAGNOSIS — K56609 Unspecified intestinal obstruction, unspecified as to partial versus complete obstruction: Secondary | ICD-10-CM | POA: Diagnosis not present

## 2022-12-06 DIAGNOSIS — M7581 Other shoulder lesions, right shoulder: Secondary | ICD-10-CM | POA: Diagnosis not present

## 2022-12-06 DIAGNOSIS — M47812 Spondylosis without myelopathy or radiculopathy, cervical region: Secondary | ICD-10-CM | POA: Diagnosis not present

## 2022-12-06 DIAGNOSIS — M7521 Bicipital tendinitis, right shoulder: Secondary | ICD-10-CM | POA: Diagnosis not present

## 2022-12-06 DIAGNOSIS — M19011 Primary osteoarthritis, right shoulder: Secondary | ICD-10-CM | POA: Diagnosis not present

## 2022-12-06 DIAGNOSIS — N1831 Chronic kidney disease, stage 3a: Secondary | ICD-10-CM | POA: Diagnosis not present

## 2022-12-07 DIAGNOSIS — D225 Melanocytic nevi of trunk: Secondary | ICD-10-CM | POA: Diagnosis not present

## 2022-12-07 DIAGNOSIS — L821 Other seborrheic keratosis: Secondary | ICD-10-CM | POA: Diagnosis not present

## 2022-12-07 DIAGNOSIS — D2272 Melanocytic nevi of left lower limb, including hip: Secondary | ICD-10-CM | POA: Diagnosis not present

## 2022-12-07 DIAGNOSIS — D2262 Melanocytic nevi of left upper limb, including shoulder: Secondary | ICD-10-CM | POA: Diagnosis not present

## 2022-12-07 DIAGNOSIS — D2261 Melanocytic nevi of right upper limb, including shoulder: Secondary | ICD-10-CM | POA: Diagnosis not present

## 2022-12-07 DIAGNOSIS — D2271 Melanocytic nevi of right lower limb, including hip: Secondary | ICD-10-CM | POA: Diagnosis not present

## 2022-12-07 DIAGNOSIS — L570 Actinic keratosis: Secondary | ICD-10-CM | POA: Diagnosis not present

## 2022-12-13 ENCOUNTER — Ambulatory Visit
Admission: RE | Admit: 2022-12-13 | Discharge: 2022-12-13 | Disposition: A | Discharge status: Home / Self Care | Payer: Medicare HMO | Source: Ambulatory Visit | Attending: Oncology | Admitting: Oncology

## 2022-12-13 DIAGNOSIS — Z08 Encounter for follow-up examination after completed treatment for malignant neoplasm: Secondary | ICD-10-CM | POA: Diagnosis not present

## 2022-12-13 DIAGNOSIS — R918 Other nonspecific abnormal finding of lung field: Secondary | ICD-10-CM | POA: Diagnosis not present

## 2022-12-13 DIAGNOSIS — C3411 Malignant neoplasm of upper lobe, right bronchus or lung: Secondary | ICD-10-CM | POA: Diagnosis not present

## 2022-12-13 DIAGNOSIS — Z85118 Personal history of other malignant neoplasm of bronchus and lung: Secondary | ICD-10-CM | POA: Insufficient documentation

## 2022-12-16 DIAGNOSIS — R918 Other nonspecific abnormal finding of lung field: Secondary | ICD-10-CM | POA: Diagnosis not present

## 2022-12-16 DIAGNOSIS — J4520 Mild intermittent asthma, uncomplicated: Secondary | ICD-10-CM | POA: Diagnosis not present

## 2022-12-16 DIAGNOSIS — J301 Allergic rhinitis due to pollen: Secondary | ICD-10-CM | POA: Diagnosis not present

## 2022-12-16 DIAGNOSIS — G4733 Obstructive sleep apnea (adult) (pediatric): Secondary | ICD-10-CM | POA: Diagnosis not present

## 2022-12-21 ENCOUNTER — Encounter: Payer: Self-pay | Admitting: Oncology

## 2022-12-21 ENCOUNTER — Inpatient Hospital Stay: Payer: Medicare HMO | Attending: Oncology | Admitting: Oncology

## 2022-12-21 VITALS — BP 133/62 | HR 72 | Temp 98.6°F | Resp 18 | Ht <= 58 in | Wt 140.3 lb

## 2022-12-21 DIAGNOSIS — Z08 Encounter for follow-up examination after completed treatment for malignant neoplasm: Secondary | ICD-10-CM | POA: Insufficient documentation

## 2022-12-21 DIAGNOSIS — Z85118 Personal history of other malignant neoplasm of bronchus and lung: Secondary | ICD-10-CM | POA: Diagnosis not present

## 2022-12-21 NOTE — Progress Notes (Signed)
Hematology/Oncology Consult note [facility]  Telephone:([phone] [phone]  Patient Care Team: Marguarite Arbour, MD as PCP - General (Internal Medicine) [name], MD as Referring Physician (Radiation Oncology) Creig Hines, MD as Consulting Physician (Oncology) Mertie Moores, MD as Referring Physician (Specialist) Glory Buff, RN as Registered Nurse   Name of the patient: Lindsey Thomas  [id number]  12/11/1940   Date of visit: 12/21/22  Diagnosis- history of lung cancer s/p SBRT   Chief complaint/ Reason for visit-routine follow-up of lung cancer  Heme/Onc history: patient is a 82 year old female with a past medical history significant for COPD hypertension hyperlipidemia and hypothyroidism among other medical problems.  She presented with symptoms of shortness of breath which prompted a CT scan. CT chest on 04/17/2019 showed a spiculated peripheral right upper lobe lung lesion measuring 18 x 13 mm concerning for primary lung cancer.  No evidence of mediastinal or hilar adenopathy.  This was followed by a PET CT scan which again showed uptake with an SUV of 3.1 in the area of the right upper lobe.  Smaller subcentimeter pulmonary nodules with no significant metabolic uptake.  No metabolic adenopathy.  Patient was seen by Dr. Meredeth Ide for possible bronchoscopy.  Patient tells me that Dr. Meredeth Ide also got in touch with radiology who thought that CT-guided lung biopsy did carry a risk of pneumothorax and lung collapse.  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  States that over the last 1 week or so she is been noticing more liquid output in her ileostomy.  She follows up with Dr. Maia Plan and will also be getting a CT abdomen done soon.  Denies any worsening  cough or shortness of breath presently.  ECOG PS- 2 Pain scale- 0   Review of systems- Review of Systems  Constitutional:  Positive for malaise/fatigue. Negative for chills, fever and weight loss.  HENT:  Negative for congestion, ear discharge and nosebleeds.   Eyes:  Negative for blurred vision.  Respiratory:  Negative for cough, hemoptysis, sputum production, shortness of breath and wheezing.   Cardiovascular:  Negative for chest pain, palpitations, orthopnea and claudication.  Gastrointestinal:  Positive for diarrhea. Negative for abdominal pain, blood in stool, constipation, heartburn, melena, nausea and vomiting.  Genitourinary:  Negative for dysuria, flank pain, frequency, hematuria and urgency.  Musculoskeletal:  Negative for back pain, joint pain and myalgias.  Skin:  Negative for rash.  Neurological:  Negative for dizziness, tingling, focal weakness, seizures, weakness and headaches.  Endo/Heme/Allergies:  Does not bruise/bleed easily.  Psychiatric/Behavioral:  Negative for depression and suicidal ideas. The patient does not have insomnia.       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   Rad tx's   Shortness of breath dyspnea    WITH EXERTION   Sleep apnea    MILD, DOES NOT USE CPAP   Thyroid nodule      Past Surgical History:  Procedure Laterality Date   ABDOMINAL HYSTERECTOMY     BREAST BIOPSY     CARDIAC  CATHETERIZATION     CATARACT EXTRACTION W/PHACO Left 02/12/2016   Procedure: CATARACT EXTRACTION PHACO AND INTRAOCULAR LENS PLACEMENT (IOC);  Surgeon: Nevada Crane, MD;  Location: [facility];  Service: Ophthalmology;  Laterality: Left;  [place] 01:30 AP% 15.2 CDE 13.71 Fluid pack lot # [id number]   CATARACT EXTRACTION W/PHACO Right 03/18/2016   Procedure: CATARACT EXTRACTION PHACO AND INTRAOCULAR LENS PLACEMENT (IOC);  Surgeon: Nevada Crane, MD;  Location: [facility];  Service: Ophthalmology;  Laterality: Right;  Lot # [id number] H [place]: 01:05.5 AP%:14.3 CDE: 9.33   COLECTOMY WITH COLOSTOMY CREATION/HARTMANN PROCEDURE N/A 01/20/2022   Procedure: COLECTOMY WITH COLOSTOMY CREATION/HARTMANN PROCEDURE;  Surgeon: Carolan Shiver, MD;  Location: [facility];  Service: General;  Laterality: N/A;   FLEXIBLE SIGMOIDOSCOPY N/A 01/14/2022   Procedure: FLEXIBLE SIGMOIDOSCOPY;  Surgeon: Toledo, Boykin Nearing, MD;  Location: [facility];  Service: Gastroenterology;  Laterality: N/A;   FLEXIBLE SIGMOIDOSCOPY N/A 01/19/2022   Procedure: FLEXIBLE SIGMOIDOSCOPY;  Surgeon: Regis Bill, MD;  Location: [facility];  Service: Endoscopy;  Laterality: N/A;   FRACTURE SURGERY Left    arm rod and screw   IR RADIOLOGIST EVAL & MGMT  03/04/2022   KNEE ARTHROSCOPY     LAPAROTOMY N/A 01/24/2022   Procedure: EXPLORATORY LAPAROTOMY;  Surgeon: Carolan Shiver, MD;  Location: [facility];  Service: General;  Laterality: N/A;   OOPHORECTOMY     RCR     ROTATOR CUFF REPAIR Left 2006    Social History   Socioeconomic History   Marital status: Married    Spouse name: Not on file   Number of children: Not on file   Years of education: Not on file   Highest education level: Not on file  Occupational History   Not on file  Tobacco Use   Smoking status: Never   Smokeless tobacco: Never  Substance and Sexual Activity   Alcohol use: No   Drug use: Never   Sexual activity: Not Currently  Other Topics Concern    Not on file  Social History Narrative   Not on file   Social Determinants of Health   Financial Resource Strain: Low Risk  (12/16/2022)   Received from [facility]   Overall Financial Resource Strain (CARDIA)    Difficulty of Paying Living Expenses: Not hard at all  Food Insecurity: No Food Insecurity (12/16/2022)   Received from [facility]   Hunger Vital Sign    Worried About Running Out of Food in the Last Year: Never true    Ran Out of Food in the Last Year: Never true  Transportation Needs: No Transportation Needs (12/16/2022)   Received from [facility] - Transportation    In the past 12 months, has lack of transportation kept you from medical appointments or from getting medications?: No    Lack of Transportation (Non-Medical): No  Physical Activity: Not on file  Stress: Not on file  Social Connections: Not on file  Intimate Partner Violence: Not At Risk (03/15/2022)   Humiliation, Afraid, Rape, and Kick questionnaire    Fear of Current or  Ex-Partner: No    Emotionally Abused: No    Physically Abused: No    Sexually Abused: No    Family History  Problem Relation Age of Onset   Emphysema Mother    Heart Problems Mother    Tuberculosis Father    Brain cancer Father    Skin cancer Father    Breast cancer Sister    Irritable bowel syndrome Sister    Lung cancer Brother    Dementia Sister    Schizophrenia Sister      Current Outpatient Medications:    acetaminophen (TYLENOL) 500 MG tablet, Take 1,000 mg by mouth daily as needed for pain., Disp: , Rfl:    apixaban (ELIQUIS) 5 MG TABS tablet, Take 5 mg by mouth 2 (two) times daily., Disp: , Rfl:    ascorbic acid (VITAMIN C) 500 MG tablet, Take 1 tablet (500 mg total) by mouth 2 (two) times daily., Disp: 60 tablet, Rfl: 1   aspirin EC 81 MG tablet, Take 81 mg by mouth daily., Disp: , Rfl:    budesonide-formoterol (SYMBICORT) 160-4.5 MCG/ACT inhaler, Inhale 2  puffs into the lungs 2 (two) times daily., Disp: , Rfl:    calcium carbonate (CALCIUM ANTACID) 500 MG chewable tablet, Chew 2 tablets by mouth daily., Disp: , Rfl:    Cholecalciferol (VITAMIN D PO), Take 5,000 Units by mouth daily., Disp: , Rfl:    Cyanocobalamin (VITAMIN B-12 PO), Take 1,000 mcg by mouth daily., Disp: , Rfl:    feeding supplement (ENSURE ENLIVE / ENSURE PLUS) LIQD, Take 237 mLs by mouth 3 (three) times daily between meals., Disp: 237 mL, Rfl: 12   fluticasone (FLONASE) 50 MCG/ACT nasal spray, Place 2 sprays into both nostrils at bedtime., Disp: , Rfl:    loperamide (IMODIUM A-D) 2 MG tablet, Take 4 mg by mouth 2 (two) times daily., Disp: , Rfl:    metoprolol tartrate (LOPRESSOR) 25 MG tablet, Take 1 tablet (25 mg total) by mouth 2 (two) times daily., Disp: 60 tablet, Rfl: 0   montelukast (SINGULAIR) 10 MG tablet, Take 10 mg by mouth at bedtime. , Disp: , Rfl:    Multiple Vitamins-Minerals (PRESERVISION AREDS PO), Take 1 capsule by mouth 2 (two) times daily., Disp: , Rfl:    omeprazole (PRILOSEC) 20 MG capsule, Take 20 mg by mouth 2 (two) times daily before a meal., Disp: , Rfl:    Polyethyl Glycol-Propyl Glycol (SYSTANE) 0.4-0.3 % SOLN, Apply 1 drop to eye 2 (two) times daily., Disp: , Rfl:    furosemide (LASIX) 20 MG tablet, Take 20 mg by mouth daily. (Patient not taking: Reported on 06/16/2022), Disp: , Rfl:    leptospermum manuka honey (MEDIHONEY) PSTE paste, Apply Medihoney to sacrum wound Q day, then cover with foam dressing.  Change foam dressing Q 3 days or PRN soiling Apply thin layer (3 mm) to wound. (Patient not taking: Reported on 06/16/2022), Disp: 44 mL, Rfl: 1   Sodium Chloride Flush (SALINE FLUSH) 0.9 % SOLN, Use as directed (Patient not taking: Reported on 06/16/2022), Disp: 10 mL, Rfl: 0   traMADol (ULTRAM) 50 MG tablet, Take 1 tablet (50 mg total) by mouth every 6 (six) hours as needed for moderate pain. (Patient not taking: Reported on 06/16/2022), Disp: 20 tablet,  Rfl: 0  Physical exam:  Vitals:   12/21/22 1116  BP: 133/62  Pulse: 72  Resp: 18  Temp: 98.6 F (37 C)  TempSrc: Tympanic  SpO2: 95%  Weight: 140 lb 4.8 oz (63.6 kg)  Height: 4\' 9"  (1.448 m)   Physical Exam Cardiovascular:     Rate and Rhythm: Normal rate and regular rhythm.     Heart sounds: Normal heart sounds.  Pulmonary:     Effort: Pulmonary effort is normal.     Breath sounds: Normal breath sounds.  Abdominal:     General: Bowel sounds are normal. There is no distension.     Palpations: Abdomen is soft.     Tenderness: There is no abdominal tenderness.     Comments: Ileostomy in place with green liquid stool noted  Skin:    General: Skin is warm and dry.  Neurological:     Mental Status: She is alert and oriented to person, place, and time.         Latest Ref Rng & Units 03/17/2022    4:01 AM  CMP  Glucose 70 - 99 mg/dL 841   BUN 8 - 23 mg/dL <5   Creatinine 3.24 - 1.00 mg/dL 4.01   Sodium 027 - 253 mmol/L 130   Potassium 3.5 - 5.1 mmol/L 3.4   Chloride 98 - 111 mmol/L 104   CO2 22 - 32 mmol/L 21   Calcium 8.9 - 10.3 mg/dL 8.1       Latest Ref Rng & Units 03/17/2022    4:01 AM  CBC  WBC 4.0 - 10.5 K/uL 10.9   Hemoglobin 12.0 - 15.0 g/dL 7.3   Hematocrit 66.4 - 46.0 % 22.3   Platelets 150 - 400 K/uL 334     No images are attached to the encounter.  CT Chest Wo Contrast  Result Date: 12/18/2022 CLINICAL DATA:  Follow-up right upper lobe non-small cell lung cancer, status post radiation EXAM: CT CHEST WITHOUT CONTRAST TECHNIQUE: Multidetector CT imaging of the chest was performed following the standard protocol without IV contrast. RADIATION DOSE REDUCTION: This exam was performed according to the departmental dose-optimization program which includes automated exposure control, adjustment of the mA and/or kV according to patient size and/or use of iterative reconstruction technique. COMPARISON:  06/11/2022 FINDINGS: Cardiovascular: The heart is normal  in size. No pericardial effusion. No evidence of thoracic aortic aneurysm. Atherosclerotic calcifications of the arch. Mild coronary atherosclerosis of the LAD. Mediastinum/Nodes: No suspicious mediastinal lymphadenopathy. 2.0 cm left thyroid nodule (series 2/image 19), unchanged. In the setting of significant comorbidities or limited life expectancy, no follow-up recommended (ref: J Am Coll Radiol. 2015 Feb;12(2): 143-50). Lungs/Pleura: Radiation changes/SBRT in the right upper lobe (series 3/image 81). Scattered small bilateral pulmonary nodules measuring up to 5 mm, unchanged from priors, benign. No new/suspicious pulmonary nodules. No focal consolidation. No pleural effusion or pneumothorax. Upper Abdomen: Visualized upper abdomen is notable for diastasis of the midline anterior abdominal wall (incompletely visualized) and vascular calcifications. Musculoskeletal: Mild degenerative changes of the visualized thoracolumbar spine. IMPRESSION: Radiation changes/SBRT in the right upper lobe. No evidence of recurrent or metastatic disease. Stable bilateral pulmonary nodules, unchanged from priors, benign. Electronically Signed   By: Charline Bills M.D.   On: 12/18/2022 02:20     Assessment and plan- Patient is a 82 y.o. female  with history of presumed stage I lung cancer involving the right upper lobe s/p SBRT in January 2021 here for routine follow-up  I have reviewed CT chest images independently and discussed findings with the patient which shows overall stable findings and no evidence of recurrent or progressive  disease.  She has known bilateral pulmonary nodules which have not changed as compared to prior.  She  is now more than 3 years out of her lung cancer diagnosis and I will move her to yearly scans at this time  Clinically patient does not appear to be any bowel obstruction today she is following up with Dr. Maia Plan since her sigmoid resection ileostomy in August 2023   Visit Diagnosis 1.  Encounter for follow-up surveillance of lung cancer      Dr. Owens Shark, MD, MPH [facility] [phone] 12/21/2022 1:12 PM

## 2022-12-23 DIAGNOSIS — Z932 Ileostomy status: Secondary | ICD-10-CM | POA: Diagnosis not present

## 2022-12-23 DIAGNOSIS — K56609 Unspecified intestinal obstruction, unspecified as to partial versus complete obstruction: Secondary | ICD-10-CM | POA: Diagnosis not present

## 2022-12-23 DIAGNOSIS — K631 Perforation of intestine (nontraumatic): Secondary | ICD-10-CM | POA: Diagnosis not present

## 2022-12-23 DIAGNOSIS — K5732 Diverticulitis of large intestine without perforation or abscess without bleeding: Secondary | ICD-10-CM | POA: Diagnosis not present

## 2022-12-28 ENCOUNTER — Other Ambulatory Visit: Payer: Self-pay | Admitting: General Surgery

## 2022-12-28 ENCOUNTER — Ambulatory Visit
Admission: RE | Admit: 2022-12-28 | Discharge: 2022-12-28 | Disposition: A | Discharge status: Home / Self Care | Payer: Medicare HMO | Source: Ambulatory Visit | Attending: General Surgery | Admitting: General Surgery

## 2022-12-28 DIAGNOSIS — R1084 Generalized abdominal pain: Secondary | ICD-10-CM

## 2022-12-28 DIAGNOSIS — C3490 Malignant neoplasm of unspecified part of unspecified bronchus or lung: Secondary | ICD-10-CM | POA: Diagnosis not present

## 2022-12-28 DIAGNOSIS — K439 Ventral hernia without obstruction or gangrene: Secondary | ICD-10-CM | POA: Diagnosis not present

## 2022-12-28 DIAGNOSIS — R109 Unspecified abdominal pain: Secondary | ICD-10-CM | POA: Diagnosis not present

## 2022-12-28 MED ORDER — IOHEXOL 300 MG/ML  SOLN
100.0000 mL | Freq: Once | INTRAMUSCULAR | Status: AC | PRN
  Administered 2022-12-28: 100 mL via INTRAVENOUS

## 2023-01-06 DIAGNOSIS — M7581 Other shoulder lesions, right shoulder: Secondary | ICD-10-CM | POA: Diagnosis not present

## 2023-01-11 ENCOUNTER — Telehealth: Payer: Self-pay | Admitting: *Deleted

## 2023-01-11 DIAGNOSIS — M7581 Other shoulder lesions, right shoulder: Secondary | ICD-10-CM | POA: Diagnosis not present

## 2023-01-11 NOTE — Telephone Encounter (Signed)
Pt requests appt on 01/13/23 with Dr Rushie Chestnut be rescheduled due to conflicting appointment at eye center.

## 2023-01-13 ENCOUNTER — Ambulatory Visit: Payer: Medicare HMO | Admitting: Radiation Oncology

## 2023-01-13 DIAGNOSIS — Z961 Presence of intraocular lens: Secondary | ICD-10-CM | POA: Diagnosis not present

## 2023-01-13 DIAGNOSIS — H26491 Other secondary cataract, right eye: Secondary | ICD-10-CM | POA: Diagnosis not present

## 2023-01-13 DIAGNOSIS — H353132 Nonexudative age-related macular degeneration, bilateral, intermediate dry stage: Secondary | ICD-10-CM | POA: Diagnosis not present

## 2023-01-17 DIAGNOSIS — M7581 Other shoulder lesions, right shoulder: Secondary | ICD-10-CM | POA: Diagnosis not present

## 2023-01-19 DIAGNOSIS — M7581 Other shoulder lesions, right shoulder: Secondary | ICD-10-CM | POA: Diagnosis not present

## 2023-01-20 DIAGNOSIS — Z79899 Other long term (current) drug therapy: Secondary | ICD-10-CM | POA: Diagnosis not present

## 2023-01-20 DIAGNOSIS — Z6832 Body mass index (BMI) 32.0-32.9, adult: Secondary | ICD-10-CM | POA: Diagnosis not present

## 2023-01-20 DIAGNOSIS — E7800 Pure hypercholesterolemia, unspecified: Secondary | ICD-10-CM | POA: Diagnosis not present

## 2023-01-20 DIAGNOSIS — I890 Lymphedema, not elsewhere classified: Secondary | ICD-10-CM | POA: Diagnosis not present

## 2023-01-20 DIAGNOSIS — N189 Chronic kidney disease, unspecified: Secondary | ICD-10-CM | POA: Diagnosis not present

## 2023-01-21 ENCOUNTER — Other Ambulatory Visit: Payer: Self-pay | Admitting: Internal Medicine

## 2023-01-21 DIAGNOSIS — M7989 Other specified soft tissue disorders: Secondary | ICD-10-CM

## 2023-01-24 DIAGNOSIS — M7581 Other shoulder lesions, right shoulder: Secondary | ICD-10-CM | POA: Diagnosis not present

## 2023-01-25 ENCOUNTER — Ambulatory Visit
Admission: RE | Admit: 2023-01-25 | Discharge: 2023-01-25 | Disposition: A | Discharge status: Home / Self Care | Payer: Medicare HMO | Source: Ambulatory Visit | Attending: Internal Medicine | Admitting: Internal Medicine

## 2023-01-25 DIAGNOSIS — Z86718 Personal history of other venous thrombosis and embolism: Secondary | ICD-10-CM | POA: Diagnosis not present

## 2023-01-25 DIAGNOSIS — M7989 Other specified soft tissue disorders: Secondary | ICD-10-CM | POA: Insufficient documentation

## 2023-01-25 DIAGNOSIS — K56609 Unspecified intestinal obstruction, unspecified as to partial versus complete obstruction: Secondary | ICD-10-CM | POA: Diagnosis not present

## 2023-01-25 DIAGNOSIS — K5732 Diverticulitis of large intestine without perforation or abscess without bleeding: Secondary | ICD-10-CM | POA: Diagnosis not present

## 2023-01-25 DIAGNOSIS — K631 Perforation of intestine (nontraumatic): Secondary | ICD-10-CM | POA: Diagnosis not present

## 2023-01-25 DIAGNOSIS — M79605 Pain in left leg: Secondary | ICD-10-CM | POA: Diagnosis not present

## 2023-01-25 DIAGNOSIS — Z932 Ileostomy status: Secondary | ICD-10-CM | POA: Diagnosis not present

## 2023-01-27 DIAGNOSIS — M7581 Other shoulder lesions, right shoulder: Secondary | ICD-10-CM | POA: Diagnosis not present

## 2023-01-31 DIAGNOSIS — M7581 Other shoulder lesions, right shoulder: Secondary | ICD-10-CM | POA: Diagnosis not present

## 2023-02-02 DIAGNOSIS — M7581 Other shoulder lesions, right shoulder: Secondary | ICD-10-CM | POA: Diagnosis not present

## 2023-02-08 DIAGNOSIS — M7581 Other shoulder lesions, right shoulder: Secondary | ICD-10-CM | POA: Diagnosis not present

## 2023-02-08 DIAGNOSIS — N1831 Chronic kidney disease, stage 3a: Secondary | ICD-10-CM | POA: Diagnosis not present

## 2023-02-10 ENCOUNTER — Ambulatory Visit
Admission: RE | Admit: 2023-02-10 | Discharge: 2023-02-10 | Disposition: A | Discharge status: Home / Self Care | Payer: Medicare HMO | Source: Ambulatory Visit | Attending: Radiation Oncology | Admitting: Radiation Oncology

## 2023-02-10 ENCOUNTER — Encounter: Payer: Self-pay | Admitting: Radiation Oncology

## 2023-02-10 VITALS — BP 118/58 | HR 65 | Temp 97.5°F | Resp 16 | Wt 146.0 lb

## 2023-02-10 DIAGNOSIS — Z923 Personal history of irradiation: Secondary | ICD-10-CM | POA: Diagnosis not present

## 2023-02-10 DIAGNOSIS — R911 Solitary pulmonary nodule: Secondary | ICD-10-CM

## 2023-02-10 DIAGNOSIS — Z85118 Personal history of other malignant neoplasm of bronchus and lung: Secondary | ICD-10-CM | POA: Insufficient documentation

## 2023-02-10 DIAGNOSIS — C3411 Malignant neoplasm of upper lobe, right bronchus or lung: Secondary | ICD-10-CM | POA: Diagnosis not present

## 2023-02-10 NOTE — Progress Notes (Signed)
Radiation Oncology Follow up Note  Name: Lindsey Thomas   Date:   02/10/2023 MRN:  [id number] DOB: 12-09-1940    This 82 y.o. female presents to the clinic today for 3-1/2-year follow-up status post SBRT to right upper lobe for stage I non-small cell lung cancer.  REFERRING PROVIDER: Marguarite Arbour, MD  HPI: Patient is a 82 year old female now out 3-1/2 years have completed SBRT to her right upper lobe for presumed stage I non-small cell lung cancer.  Seen today in routine follow-up she is doing well.  She specifically denies cough hemoptysis chest tightness or any change in her pulmonary status.  She had a recent CT scan of her chest.  Abdomen pelvis showing no evidence of disease.  She does have some radiation changes in her right upper lobe consistent with SBRT.  COMPLICATIONS OF TREATMENT: none  FOLLOW UP COMPLIANCE: keeps appointments   PHYSICAL EXAM:  BP (!) 118/58   Pulse 65   Temp (!) 97.5 F (36.4 C) (Tympanic)   Resp 16   Wt 146 lb (66.2 kg)   BMI 31.59 kg/m  Well-developed well-nourished patient in NAD. HEENT reveals PERLA, EOMI, discs not visualized.  Oral cavity is clear. No oral mucosal lesions are identified. Neck is clear without evidence of cervical or supraclavicular adenopathy. Lungs are clear to A&P. Cardiac examination is essentially unremarkable with regular rate and rhythm without murmur rub or thrill. Abdomen is benign with no organomegaly or masses noted. Motor sensory and DTR levels are equal and symmetric in the upper and lower extremities. Cranial nerves II through XII are grossly intact. Proprioception is intact. No peripheral adenopathy or edema is identified. No motor or sensory levels are noted. Crude visual fields are within normal range.  RADIOLOGY RESULTS: CT scans chest abdomen pelvis reviewed compatible with above-stated findings.  PLAN: At this time I am going to turn follow-up care over to medical oncology.  Have to reevaluate this patient in  time the future should that be indicated.  Patient knows to call with any concerns.  I would like to take this opportunity to thank you for allowing me to participate in the care of your patient.Lindsey Miller, MD

## 2023-02-11 DIAGNOSIS — M7581 Other shoulder lesions, right shoulder: Secondary | ICD-10-CM | POA: Diagnosis not present

## 2023-02-14 DIAGNOSIS — M7581 Other shoulder lesions, right shoulder: Secondary | ICD-10-CM | POA: Diagnosis not present

## 2023-02-16 DIAGNOSIS — M7581 Other shoulder lesions, right shoulder: Secondary | ICD-10-CM | POA: Diagnosis not present

## 2023-02-23 DIAGNOSIS — M7581 Other shoulder lesions, right shoulder: Secondary | ICD-10-CM | POA: Diagnosis not present

## 2023-02-24 DIAGNOSIS — Z932 Ileostomy status: Secondary | ICD-10-CM | POA: Diagnosis not present

## 2023-02-24 DIAGNOSIS — K56609 Unspecified intestinal obstruction, unspecified as to partial versus complete obstruction: Secondary | ICD-10-CM | POA: Diagnosis not present

## 2023-02-24 DIAGNOSIS — K5732 Diverticulitis of large intestine without perforation or abscess without bleeding: Secondary | ICD-10-CM | POA: Diagnosis not present

## 2023-02-24 DIAGNOSIS — K631 Perforation of intestine (nontraumatic): Secondary | ICD-10-CM | POA: Diagnosis not present

## 2023-02-25 DIAGNOSIS — M7581 Other shoulder lesions, right shoulder: Secondary | ICD-10-CM | POA: Diagnosis not present

## 2023-02-28 DIAGNOSIS — M7581 Other shoulder lesions, right shoulder: Secondary | ICD-10-CM | POA: Diagnosis not present

## 2023-03-02 DIAGNOSIS — M7581 Other shoulder lesions, right shoulder: Secondary | ICD-10-CM | POA: Diagnosis not present

## 2023-03-10 DIAGNOSIS — H26491 Other secondary cataract, right eye: Secondary | ICD-10-CM | POA: Diagnosis not present

## 2023-03-25 DIAGNOSIS — M7521 Bicipital tendinitis, right shoulder: Secondary | ICD-10-CM | POA: Diagnosis not present

## 2023-03-25 DIAGNOSIS — M7581 Other shoulder lesions, right shoulder: Secondary | ICD-10-CM | POA: Diagnosis not present

## 2023-03-25 DIAGNOSIS — M47816 Spondylosis without myelopathy or radiculopathy, lumbar region: Secondary | ICD-10-CM | POA: Diagnosis not present

## 2023-03-25 DIAGNOSIS — M19011 Primary osteoarthritis, right shoulder: Secondary | ICD-10-CM | POA: Diagnosis not present

## 2023-03-28 DIAGNOSIS — K56609 Unspecified intestinal obstruction, unspecified as to partial versus complete obstruction: Secondary | ICD-10-CM | POA: Diagnosis not present

## 2023-03-28 DIAGNOSIS — Z932 Ileostomy status: Secondary | ICD-10-CM | POA: Diagnosis not present

## 2023-03-28 DIAGNOSIS — K5732 Diverticulitis of large intestine without perforation or abscess without bleeding: Secondary | ICD-10-CM | POA: Diagnosis not present

## 2023-03-28 DIAGNOSIS — K631 Perforation of intestine (nontraumatic): Secondary | ICD-10-CM | POA: Diagnosis not present

## 2023-04-07 DIAGNOSIS — Z0100 Encounter for examination of eyes and vision without abnormal findings: Secondary | ICD-10-CM | POA: Diagnosis not present

## 2023-04-13 DIAGNOSIS — M545 Low back pain, unspecified: Secondary | ICD-10-CM | POA: Diagnosis not present

## 2023-04-13 DIAGNOSIS — G8929 Other chronic pain: Secondary | ICD-10-CM | POA: Diagnosis not present

## 2023-04-18 DIAGNOSIS — K5732 Diverticulitis of large intestine without perforation or abscess without bleeding: Secondary | ICD-10-CM | POA: Diagnosis not present

## 2023-04-18 DIAGNOSIS — Z932 Ileostomy status: Secondary | ICD-10-CM | POA: Diagnosis not present

## 2023-04-18 DIAGNOSIS — K631 Perforation of intestine (nontraumatic): Secondary | ICD-10-CM | POA: Diagnosis not present

## 2023-04-18 DIAGNOSIS — K56609 Unspecified intestinal obstruction, unspecified as to partial versus complete obstruction: Secondary | ICD-10-CM | POA: Diagnosis not present

## 2023-04-19 DIAGNOSIS — G8929 Other chronic pain: Secondary | ICD-10-CM | POA: Diagnosis not present

## 2023-04-19 DIAGNOSIS — M545 Low back pain, unspecified: Secondary | ICD-10-CM | POA: Diagnosis not present

## 2023-04-22 DIAGNOSIS — E7800 Pure hypercholesterolemia, unspecified: Secondary | ICD-10-CM | POA: Diagnosis not present

## 2023-04-22 DIAGNOSIS — E66811 Obesity, class 1: Secondary | ICD-10-CM | POA: Diagnosis not present

## 2023-04-22 DIAGNOSIS — M545 Low back pain, unspecified: Secondary | ICD-10-CM | POA: Diagnosis not present

## 2023-04-22 DIAGNOSIS — N1831 Chronic kidney disease, stage 3a: Secondary | ICD-10-CM | POA: Diagnosis not present

## 2023-04-22 DIAGNOSIS — Z79899 Other long term (current) drug therapy: Secondary | ICD-10-CM | POA: Diagnosis not present

## 2023-04-22 DIAGNOSIS — I890 Lymphedema, not elsewhere classified: Secondary | ICD-10-CM | POA: Diagnosis not present

## 2023-04-22 DIAGNOSIS — Z6832 Body mass index (BMI) 32.0-32.9, adult: Secondary | ICD-10-CM | POA: Diagnosis not present

## 2023-04-22 DIAGNOSIS — Z23 Encounter for immunization: Secondary | ICD-10-CM | POA: Diagnosis not present

## 2023-04-22 DIAGNOSIS — G8929 Other chronic pain: Secondary | ICD-10-CM | POA: Diagnosis not present

## 2023-04-29 DIAGNOSIS — G8929 Other chronic pain: Secondary | ICD-10-CM | POA: Diagnosis not present

## 2023-04-29 DIAGNOSIS — M545 Low back pain, unspecified: Secondary | ICD-10-CM | POA: Diagnosis not present

## 2023-05-02 DIAGNOSIS — G8929 Other chronic pain: Secondary | ICD-10-CM | POA: Diagnosis not present

## 2023-05-02 DIAGNOSIS — M545 Low back pain, unspecified: Secondary | ICD-10-CM | POA: Diagnosis not present

## 2023-05-09 DIAGNOSIS — G8929 Other chronic pain: Secondary | ICD-10-CM | POA: Diagnosis not present

## 2023-05-09 DIAGNOSIS — M545 Low back pain, unspecified: Secondary | ICD-10-CM | POA: Diagnosis not present

## 2023-05-13 DIAGNOSIS — G8929 Other chronic pain: Secondary | ICD-10-CM | POA: Diagnosis not present

## 2023-05-13 DIAGNOSIS — M545 Low back pain, unspecified: Secondary | ICD-10-CM | POA: Diagnosis not present

## 2023-05-16 DIAGNOSIS — G8929 Other chronic pain: Secondary | ICD-10-CM | POA: Diagnosis not present

## 2023-05-16 DIAGNOSIS — M545 Low back pain, unspecified: Secondary | ICD-10-CM | POA: Diagnosis not present

## 2023-05-17 DIAGNOSIS — K631 Perforation of intestine (nontraumatic): Secondary | ICD-10-CM | POA: Diagnosis not present

## 2023-05-17 DIAGNOSIS — K5732 Diverticulitis of large intestine without perforation or abscess without bleeding: Secondary | ICD-10-CM | POA: Diagnosis not present

## 2023-05-17 DIAGNOSIS — K56609 Unspecified intestinal obstruction, unspecified as to partial versus complete obstruction: Secondary | ICD-10-CM | POA: Diagnosis not present

## 2023-05-17 DIAGNOSIS — Z932 Ileostomy status: Secondary | ICD-10-CM | POA: Diagnosis not present

## 2023-05-18 DIAGNOSIS — M545 Low back pain, unspecified: Secondary | ICD-10-CM | POA: Diagnosis not present

## 2023-05-18 DIAGNOSIS — G8929 Other chronic pain: Secondary | ICD-10-CM | POA: Diagnosis not present

## 2023-05-23 DIAGNOSIS — M545 Low back pain, unspecified: Secondary | ICD-10-CM | POA: Diagnosis not present

## 2023-05-23 DIAGNOSIS — G8929 Other chronic pain: Secondary | ICD-10-CM | POA: Diagnosis not present

## 2023-05-27 DIAGNOSIS — M47816 Spondylosis without myelopathy or radiculopathy, lumbar region: Secondary | ICD-10-CM | POA: Diagnosis not present

## 2023-05-27 DIAGNOSIS — D649 Anemia, unspecified: Secondary | ICD-10-CM | POA: Diagnosis not present

## 2023-05-27 DIAGNOSIS — R944 Abnormal results of kidney function studies: Secondary | ICD-10-CM | POA: Diagnosis not present

## 2023-05-27 DIAGNOSIS — R7989 Other specified abnormal findings of blood chemistry: Secondary | ICD-10-CM | POA: Diagnosis not present

## 2023-05-27 DIAGNOSIS — G8929 Other chronic pain: Secondary | ICD-10-CM | POA: Diagnosis not present

## 2023-05-27 DIAGNOSIS — M545 Low back pain, unspecified: Secondary | ICD-10-CM | POA: Diagnosis not present

## 2023-05-27 DIAGNOSIS — M19011 Primary osteoarthritis, right shoulder: Secondary | ICD-10-CM | POA: Diagnosis not present

## 2023-05-27 DIAGNOSIS — M7521 Bicipital tendinitis, right shoulder: Secondary | ICD-10-CM | POA: Diagnosis not present

## 2023-05-27 DIAGNOSIS — M7581 Other shoulder lesions, right shoulder: Secondary | ICD-10-CM | POA: Diagnosis not present

## 2023-06-06 DIAGNOSIS — Z0100 Encounter for examination of eyes and vision without abnormal findings: Secondary | ICD-10-CM | POA: Diagnosis not present

## 2023-06-13 DIAGNOSIS — Z79899 Other long term (current) drug therapy: Secondary | ICD-10-CM | POA: Diagnosis not present

## 2023-06-20 DIAGNOSIS — M47816 Spondylosis without myelopathy or radiculopathy, lumbar region: Secondary | ICD-10-CM | POA: Diagnosis not present

## 2023-06-20 DIAGNOSIS — M5416 Radiculopathy, lumbar region: Secondary | ICD-10-CM | POA: Diagnosis not present

## 2023-06-20 DIAGNOSIS — J449 Chronic obstructive pulmonary disease, unspecified: Secondary | ICD-10-CM | POA: Diagnosis not present

## 2023-06-21 ENCOUNTER — Other Ambulatory Visit: Payer: Self-pay | Admitting: Family Medicine

## 2023-06-21 DIAGNOSIS — M47816 Spondylosis without myelopathy or radiculopathy, lumbar region: Secondary | ICD-10-CM

## 2023-06-25 ENCOUNTER — Ambulatory Visit
Admission: RE | Admit: 2023-06-25 | Discharge: 2023-06-25 | Disposition: A | Discharge status: Home / Self Care | Payer: Medicare HMO | Source: Ambulatory Visit | Attending: Family Medicine | Admitting: Family Medicine

## 2023-06-25 DIAGNOSIS — G8929 Other chronic pain: Secondary | ICD-10-CM | POA: Diagnosis not present

## 2023-06-25 DIAGNOSIS — M545 Low back pain, unspecified: Secondary | ICD-10-CM | POA: Diagnosis not present

## 2023-06-25 DIAGNOSIS — M47816 Spondylosis without myelopathy or radiculopathy, lumbar region: Secondary | ICD-10-CM

## 2023-07-13 DIAGNOSIS — M47816 Spondylosis without myelopathy or radiculopathy, lumbar region: Secondary | ICD-10-CM | POA: Diagnosis not present

## 2023-07-13 DIAGNOSIS — M5416 Radiculopathy, lumbar region: Secondary | ICD-10-CM | POA: Diagnosis not present

## 2023-07-26 DIAGNOSIS — M47816 Spondylosis without myelopathy or radiculopathy, lumbar region: Secondary | ICD-10-CM | POA: Diagnosis not present

## 2023-07-27 DIAGNOSIS — Z9889 Other specified postprocedural states: Secondary | ICD-10-CM | POA: Diagnosis not present

## 2023-07-27 DIAGNOSIS — Z961 Presence of intraocular lens: Secondary | ICD-10-CM | POA: Diagnosis not present

## 2023-07-27 DIAGNOSIS — Z0100 Encounter for examination of eyes and vision without abnormal findings: Secondary | ICD-10-CM | POA: Diagnosis not present

## 2023-07-27 DIAGNOSIS — H353132 Nonexudative age-related macular degeneration, bilateral, intermediate dry stage: Secondary | ICD-10-CM | POA: Diagnosis not present

## 2023-07-27 DIAGNOSIS — H43813 Vitreous degeneration, bilateral: Secondary | ICD-10-CM | POA: Diagnosis not present

## 2023-07-28 DIAGNOSIS — K5732 Diverticulitis of large intestine without perforation or abscess without bleeding: Secondary | ICD-10-CM | POA: Diagnosis not present

## 2023-07-28 DIAGNOSIS — K631 Perforation of intestine (nontraumatic): Secondary | ICD-10-CM | POA: Diagnosis not present

## 2023-07-28 DIAGNOSIS — K56609 Unspecified intestinal obstruction, unspecified as to partial versus complete obstruction: Secondary | ICD-10-CM | POA: Diagnosis not present

## 2023-07-28 DIAGNOSIS — Z932 Ileostomy status: Secondary | ICD-10-CM | POA: Diagnosis not present

## 2023-08-01 DIAGNOSIS — E669 Obesity, unspecified: Secondary | ICD-10-CM | POA: Diagnosis not present

## 2023-08-01 DIAGNOSIS — M25511 Pain in right shoulder: Secondary | ICD-10-CM | POA: Diagnosis not present

## 2023-08-01 DIAGNOSIS — M810 Age-related osteoporosis without current pathological fracture: Secondary | ICD-10-CM | POA: Diagnosis not present

## 2023-08-01 DIAGNOSIS — R8290 Unspecified abnormal findings in urine: Secondary | ICD-10-CM | POA: Diagnosis not present

## 2023-08-01 DIAGNOSIS — Z0000 Encounter for general adult medical examination without abnormal findings: Secondary | ICD-10-CM | POA: Diagnosis not present

## 2023-08-01 DIAGNOSIS — R070 Pain in throat: Secondary | ICD-10-CM | POA: Diagnosis not present

## 2023-08-01 DIAGNOSIS — Z1231 Encounter for screening mammogram for malignant neoplasm of breast: Secondary | ICD-10-CM | POA: Diagnosis not present

## 2023-08-01 DIAGNOSIS — Z79899 Other long term (current) drug therapy: Secondary | ICD-10-CM | POA: Diagnosis not present

## 2023-08-01 DIAGNOSIS — E785 Hyperlipidemia, unspecified: Secondary | ICD-10-CM | POA: Diagnosis not present

## 2023-08-02 ENCOUNTER — Other Ambulatory Visit: Payer: Self-pay | Admitting: Internal Medicine

## 2023-08-02 DIAGNOSIS — Z1231 Encounter for screening mammogram for malignant neoplasm of breast: Secondary | ICD-10-CM

## 2023-08-04 DIAGNOSIS — R070 Pain in throat: Secondary | ICD-10-CM | POA: Diagnosis not present

## 2023-08-04 DIAGNOSIS — K219 Gastro-esophageal reflux disease without esophagitis: Secondary | ICD-10-CM | POA: Diagnosis not present

## 2023-08-12 DIAGNOSIS — M47816 Spondylosis without myelopathy or radiculopathy, lumbar region: Secondary | ICD-10-CM | POA: Diagnosis not present

## 2023-08-15 DIAGNOSIS — M7521 Bicipital tendinitis, right shoulder: Secondary | ICD-10-CM | POA: Diagnosis not present

## 2023-08-15 DIAGNOSIS — M19011 Primary osteoarthritis, right shoulder: Secondary | ICD-10-CM | POA: Diagnosis not present

## 2023-08-15 DIAGNOSIS — M7581 Other shoulder lesions, right shoulder: Secondary | ICD-10-CM | POA: Diagnosis not present

## 2023-08-15 DIAGNOSIS — E66812 Obesity, class 2: Secondary | ICD-10-CM | POA: Diagnosis not present

## 2023-08-26 DIAGNOSIS — M47816 Spondylosis without myelopathy or radiculopathy, lumbar region: Secondary | ICD-10-CM | POA: Diagnosis not present

## 2023-09-13 DIAGNOSIS — M47816 Spondylosis without myelopathy or radiculopathy, lumbar region: Secondary | ICD-10-CM | POA: Diagnosis not present

## 2023-09-23 DIAGNOSIS — K631 Perforation of intestine (nontraumatic): Secondary | ICD-10-CM | POA: Diagnosis not present

## 2023-09-23 DIAGNOSIS — Z932 Ileostomy status: Secondary | ICD-10-CM | POA: Diagnosis not present

## 2023-09-23 DIAGNOSIS — K5732 Diverticulitis of large intestine without perforation or abscess without bleeding: Secondary | ICD-10-CM | POA: Diagnosis not present

## 2023-09-23 DIAGNOSIS — K56609 Unspecified intestinal obstruction, unspecified as to partial versus complete obstruction: Secondary | ICD-10-CM | POA: Diagnosis not present

## 2023-09-29 DIAGNOSIS — K219 Gastro-esophageal reflux disease without esophagitis: Secondary | ICD-10-CM | POA: Diagnosis not present

## 2023-09-29 DIAGNOSIS — J301 Allergic rhinitis due to pollen: Secondary | ICD-10-CM | POA: Diagnosis not present

## 2023-09-30 ENCOUNTER — Ambulatory Visit
Admission: RE | Admit: 2023-09-30 | Discharge: 2023-09-30 | Disposition: A | Discharge status: Home / Self Care | Source: Ambulatory Visit | Attending: Internal Medicine | Admitting: Internal Medicine

## 2023-09-30 DIAGNOSIS — Z1231 Encounter for screening mammogram for malignant neoplasm of breast: Secondary | ICD-10-CM | POA: Diagnosis not present

## 2023-10-21 DIAGNOSIS — K5732 Diverticulitis of large intestine without perforation or abscess without bleeding: Secondary | ICD-10-CM | POA: Diagnosis not present

## 2023-10-21 DIAGNOSIS — K631 Perforation of intestine (nontraumatic): Secondary | ICD-10-CM | POA: Diagnosis not present

## 2023-10-21 DIAGNOSIS — K56609 Unspecified intestinal obstruction, unspecified as to partial versus complete obstruction: Secondary | ICD-10-CM | POA: Diagnosis not present

## 2023-10-21 DIAGNOSIS — Z932 Ileostomy status: Secondary | ICD-10-CM | POA: Diagnosis not present

## 2023-10-25 DIAGNOSIS — M47816 Spondylosis without myelopathy or radiculopathy, lumbar region: Secondary | ICD-10-CM | POA: Diagnosis not present

## 2023-10-25 DIAGNOSIS — M5416 Radiculopathy, lumbar region: Secondary | ICD-10-CM | POA: Diagnosis not present

## 2023-10-28 DIAGNOSIS — K5732 Diverticulitis of large intestine without perforation or abscess without bleeding: Secondary | ICD-10-CM | POA: Diagnosis not present

## 2023-10-28 DIAGNOSIS — Z932 Ileostomy status: Secondary | ICD-10-CM | POA: Diagnosis not present

## 2023-10-28 DIAGNOSIS — K56609 Unspecified intestinal obstruction, unspecified as to partial versus complete obstruction: Secondary | ICD-10-CM | POA: Diagnosis not present

## 2023-10-28 DIAGNOSIS — K631 Perforation of intestine (nontraumatic): Secondary | ICD-10-CM | POA: Diagnosis not present

## 2023-11-04 DIAGNOSIS — Z6835 Body mass index (BMI) 35.0-35.9, adult: Secondary | ICD-10-CM | POA: Diagnosis not present

## 2023-11-04 DIAGNOSIS — N1831 Chronic kidney disease, stage 3a: Secondary | ICD-10-CM | POA: Diagnosis not present

## 2023-11-04 DIAGNOSIS — M810 Age-related osteoporosis without current pathological fracture: Secondary | ICD-10-CM | POA: Diagnosis not present

## 2023-11-04 DIAGNOSIS — I890 Lymphedema, not elsewhere classified: Secondary | ICD-10-CM | POA: Diagnosis not present

## 2023-11-04 DIAGNOSIS — E66812 Obesity, class 2: Secondary | ICD-10-CM | POA: Diagnosis not present

## 2023-11-04 DIAGNOSIS — R0602 Shortness of breath: Secondary | ICD-10-CM | POA: Diagnosis not present

## 2023-11-04 DIAGNOSIS — Z79899 Other long term (current) drug therapy: Secondary | ICD-10-CM | POA: Diagnosis not present

## 2023-11-04 DIAGNOSIS — E7800 Pure hypercholesterolemia, unspecified: Secondary | ICD-10-CM | POA: Diagnosis not present

## 2023-11-11 DIAGNOSIS — J810 Acute pulmonary edema: Secondary | ICD-10-CM | POA: Diagnosis not present

## 2023-11-11 DIAGNOSIS — N1831 Chronic kidney disease, stage 3a: Secondary | ICD-10-CM | POA: Diagnosis not present

## 2023-11-11 DIAGNOSIS — I129 Hypertensive chronic kidney disease with stage 1 through stage 4 chronic kidney disease, or unspecified chronic kidney disease: Secondary | ICD-10-CM | POA: Diagnosis not present

## 2023-11-16 DIAGNOSIS — K56609 Unspecified intestinal obstruction, unspecified as to partial versus complete obstruction: Secondary | ICD-10-CM | POA: Diagnosis not present

## 2023-11-16 DIAGNOSIS — Z932 Ileostomy status: Secondary | ICD-10-CM | POA: Diagnosis not present

## 2023-11-16 DIAGNOSIS — K5732 Diverticulitis of large intestine without perforation or abscess without bleeding: Secondary | ICD-10-CM | POA: Diagnosis not present

## 2023-11-16 DIAGNOSIS — K631 Perforation of intestine (nontraumatic): Secondary | ICD-10-CM | POA: Diagnosis not present

## 2023-11-18 DIAGNOSIS — M7581 Other shoulder lesions, right shoulder: Secondary | ICD-10-CM | POA: Diagnosis not present

## 2023-11-18 DIAGNOSIS — M19011 Primary osteoarthritis, right shoulder: Secondary | ICD-10-CM | POA: Diagnosis not present

## 2023-11-18 DIAGNOSIS — M7521 Bicipital tendinitis, right shoulder: Secondary | ICD-10-CM | POA: Diagnosis not present

## 2023-11-29 DIAGNOSIS — J810 Acute pulmonary edema: Secondary | ICD-10-CM | POA: Diagnosis not present

## 2023-11-30 DIAGNOSIS — R0689 Other abnormalities of breathing: Secondary | ICD-10-CM | POA: Diagnosis not present

## 2023-11-30 DIAGNOSIS — R8290 Unspecified abnormal findings in urine: Secondary | ICD-10-CM | POA: Diagnosis not present

## 2023-11-30 DIAGNOSIS — Z79899 Other long term (current) drug therapy: Secondary | ICD-10-CM | POA: Diagnosis not present

## 2023-11-30 DIAGNOSIS — I50812 Chronic right heart failure: Secondary | ICD-10-CM | POA: Diagnosis not present

## 2023-11-30 DIAGNOSIS — E66811 Obesity, class 1: Secondary | ICD-10-CM | POA: Diagnosis not present

## 2023-11-30 DIAGNOSIS — Z6834 Body mass index (BMI) 34.0-34.9, adult: Secondary | ICD-10-CM | POA: Diagnosis not present

## 2023-11-30 DIAGNOSIS — E6609 Other obesity due to excess calories: Secondary | ICD-10-CM | POA: Diagnosis not present

## 2023-12-08 DIAGNOSIS — N1831 Chronic kidney disease, stage 3a: Secondary | ICD-10-CM | POA: Diagnosis not present

## 2023-12-21 ENCOUNTER — Ambulatory Visit
Admission: RE | Admit: 2023-12-21 | Discharge: 2023-12-21 | Disposition: A | Discharge status: Home / Self Care | Payer: Medicare HMO | Source: Ambulatory Visit | Attending: Oncology | Admitting: Oncology

## 2023-12-21 DIAGNOSIS — Z85118 Personal history of other malignant neoplasm of bronchus and lung: Secondary | ICD-10-CM | POA: Insufficient documentation

## 2023-12-21 DIAGNOSIS — E041 Nontoxic single thyroid nodule: Secondary | ICD-10-CM | POA: Diagnosis not present

## 2023-12-21 DIAGNOSIS — Z08 Encounter for follow-up examination after completed treatment for malignant neoplasm: Secondary | ICD-10-CM | POA: Insufficient documentation

## 2023-12-21 DIAGNOSIS — I728 Aneurysm of other specified arteries: Secondary | ICD-10-CM | POA: Diagnosis not present

## 2023-12-21 DIAGNOSIS — C3490 Malignant neoplasm of unspecified part of unspecified bronchus or lung: Secondary | ICD-10-CM | POA: Diagnosis not present

## 2023-12-21 DIAGNOSIS — I700 Atherosclerosis of aorta: Secondary | ICD-10-CM | POA: Diagnosis not present

## 2023-12-28 ENCOUNTER — Ambulatory Visit: Payer: Medicare HMO | Admitting: Oncology

## 2024-01-04 DIAGNOSIS — Z85118 Personal history of other malignant neoplasm of bronchus and lung: Secondary | ICD-10-CM | POA: Diagnosis not present

## 2024-01-04 DIAGNOSIS — C3411 Malignant neoplasm of upper lobe, right bronchus or lung: Secondary | ICD-10-CM | POA: Diagnosis not present

## 2024-01-04 DIAGNOSIS — R0689 Other abnormalities of breathing: Secondary | ICD-10-CM | POA: Diagnosis not present

## 2024-01-04 DIAGNOSIS — I272 Pulmonary hypertension, unspecified: Secondary | ICD-10-CM | POA: Diagnosis not present

## 2024-01-04 DIAGNOSIS — J9811 Atelectasis: Secondary | ICD-10-CM | POA: Diagnosis not present

## 2024-01-04 DIAGNOSIS — R0600 Dyspnea, unspecified: Secondary | ICD-10-CM | POA: Diagnosis not present

## 2024-01-11 DIAGNOSIS — N1832 Chronic kidney disease, stage 3b: Secondary | ICD-10-CM | POA: Diagnosis not present

## 2024-01-11 DIAGNOSIS — E871 Hypo-osmolality and hyponatremia: Secondary | ICD-10-CM | POA: Diagnosis not present

## 2024-01-17 DIAGNOSIS — Z932 Ileostomy status: Secondary | ICD-10-CM | POA: Diagnosis not present

## 2024-01-17 DIAGNOSIS — K631 Perforation of intestine (nontraumatic): Secondary | ICD-10-CM | POA: Diagnosis not present

## 2024-01-17 DIAGNOSIS — K5732 Diverticulitis of large intestine without perforation or abscess without bleeding: Secondary | ICD-10-CM | POA: Diagnosis not present

## 2024-01-17 DIAGNOSIS — K56609 Unspecified intestinal obstruction, unspecified as to partial versus complete obstruction: Secondary | ICD-10-CM | POA: Diagnosis not present

## 2024-01-20 DIAGNOSIS — M7521 Bicipital tendinitis, right shoulder: Secondary | ICD-10-CM | POA: Diagnosis not present

## 2024-01-20 DIAGNOSIS — M7581 Other shoulder lesions, right shoulder: Secondary | ICD-10-CM | POA: Diagnosis not present

## 2024-01-20 DIAGNOSIS — M19011 Primary osteoarthritis, right shoulder: Secondary | ICD-10-CM | POA: Diagnosis not present

## 2024-01-24 ENCOUNTER — Inpatient Hospital Stay: Attending: Oncology | Admitting: Oncology

## 2024-01-24 ENCOUNTER — Encounter: Payer: Self-pay | Admitting: Oncology

## 2024-01-24 VITALS — BP 95/51 | HR 75 | Temp 97.0°F | Resp 18 | Ht <= 58 in | Wt 164.0 lb

## 2024-01-24 DIAGNOSIS — Z85118 Personal history of other malignant neoplasm of bronchus and lung: Secondary | ICD-10-CM | POA: Insufficient documentation

## 2024-01-24 DIAGNOSIS — Z923 Personal history of irradiation: Secondary | ICD-10-CM | POA: Diagnosis not present

## 2024-01-24 DIAGNOSIS — Z08 Encounter for follow-up examination after completed treatment for malignant neoplasm: Secondary | ICD-10-CM

## 2024-01-24 DIAGNOSIS — Z79899 Other long term (current) drug therapy: Secondary | ICD-10-CM | POA: Insufficient documentation

## 2024-01-25 DIAGNOSIS — G4733 Obstructive sleep apnea (adult) (pediatric): Secondary | ICD-10-CM | POA: Diagnosis not present

## 2024-01-25 DIAGNOSIS — H43813 Vitreous degeneration, bilateral: Secondary | ICD-10-CM | POA: Diagnosis not present

## 2024-01-25 DIAGNOSIS — I272 Pulmonary hypertension, unspecified: Secondary | ICD-10-CM | POA: Diagnosis not present

## 2024-01-25 DIAGNOSIS — R0609 Other forms of dyspnea: Secondary | ICD-10-CM | POA: Diagnosis not present

## 2024-01-25 DIAGNOSIS — Z85118 Personal history of other malignant neoplasm of bronchus and lung: Secondary | ICD-10-CM | POA: Diagnosis not present

## 2024-01-25 DIAGNOSIS — H353132 Nonexudative age-related macular degeneration, bilateral, intermediate dry stage: Secondary | ICD-10-CM | POA: Diagnosis not present

## 2024-01-25 DIAGNOSIS — Z0100 Encounter for examination of eyes and vision without abnormal findings: Secondary | ICD-10-CM | POA: Diagnosis not present

## 2024-01-25 DIAGNOSIS — Z961 Presence of intraocular lens: Secondary | ICD-10-CM | POA: Diagnosis not present

## 2024-01-28 NOTE — Progress Notes (Signed)
 Hematology/Oncology Consult note [facility]  [phone] [phone]  Patient Care Team: Auston Reyes BIRCH, MD as PCP - General (Internal Medicine) Lenn Aran, MD as Referring Physician (Radiation Oncology) Melanee Annah BROCKS, MD as Consulting Physician (Oncology) Theotis Lavelle BRAVO, MD as Referring Physician (Specialist) Verdene Gills, RN as Registered Nurse   Name of the patient: Lindsey Thomas  [id number]  [birth year]   Date of visit: 01/28/24  Diagnosis-lung cancer s/p SBRT  Chief complaint/ Reason for visit-routine follow-up of lung cancer presently in remission  Heme/Onc history: patient is a 83 year old female with a past medical history significant for COPD hypertension hyperlipidemia and hypothyroidism among other medical problems.  She presented with symptoms of shortness of breath which prompted a CT scan. CT chest on 04/17/2019 showed a spiculated peripheral right upper lobe lung lesion measuring 18 x 13 mm concerning for primary lung cancer.  No evidence of mediastinal or hilar adenopathy.  This was followed by a PET CT scan which again showed uptake with an SUV of 3.1 in the area of the right upper lobe.  Smaller subcentimeter pulmonary nodules with no significant metabolic uptake.  No metabolic adenopathy.  Patient was seen by Dr. Theotis for possible bronchoscopy.  Patient tells me that Dr. Theotis also got in touch with radiology who thought that CT-guided lung biopsy did carry a risk of pneumothorax and lung collapse.  biopsy was not possible safely and she underwent SBRT to RUL without biopsy in January 2021   Interval history-she is doing well for her age.  Denies any recent hospitalizations.  Denies any worsening shortness of breath or cough.  ECOG PS- 1 Pain scale- 0   Review of systems- ROS    Allergies  Allergen Reactions   Eryc [Erythromycin]    Levofloxacin Nausea Only   Percocet [Oxycodone-Acetaminophen ]  Nausea And Vomiting   Augmentin [Amoxicillin-Pot Clavulanate] Rash   Celecoxib Palpitations     Past Medical History:  Diagnosis Date   Anxiety    Arthritis    Asthma    DVT (deep venous thrombosis) (HCC)    Edema    feet   Emphysema of lung (HCC)    GERD (gastroesophageal reflux disease)    Headache    MIGRAINES   History of orthopnea    Hypercholesterolemia    Hypothyroidism    NODULES   Lymphedema    Migraine    Non-small cell lung cancer, 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   Rad tx's   Shortness of breath dyspnea    WITH EXERTION   Sleep apnea    MILD, DOES NOT USE CPAP   Thyroid  nodule      Past Surgical History:  Procedure Laterality Date   ABDOMINAL HYSTERECTOMY     BREAST BIOPSY     CARDIAC CATHETERIZATION     CATARACT EXTRACTION W/PHACO Left 02/12/2016   Procedure: CATARACT EXTRACTION PHACO AND INTRAOCULAR LENS PLACEMENT (IOC);  Surgeon: Adine Oneil Novak, MD;  Location: [facility];  Service: Ophthalmology;  Laterality: Left;  US  01:30 AP% 15.2 CDE 13.71 Fluid pack lot # [id number]   CATARACT EXTRACTION W/PHACO Right 03/18/2016   Procedure: CATARACT EXTRACTION PHACO AND INTRAOCULAR LENS PLACEMENT (IOC);  Surgeon: Adine Oneil Novak, MD;  Location: [facility];  Service: Ophthalmology;  Laterality: Right;  Lot # [id number] H US : 01:05.5 AP%:14.3 CDE: 9.33   COLECTOMY WITH COLOSTOMY CREATION/HARTMANN PROCEDURE N/A 01/20/2022   Procedure: COLECTOMY WITH COLOSTOMY CREATION/HARTMANN PROCEDURE;  Surgeon: Rodolph Romano, MD;  Location: [facility];  Service: General;  Laterality: N/A;   FLEXIBLE SIGMOIDOSCOPY N/A 01/14/2022   Procedure: FLEXIBLE SIGMOIDOSCOPY;  Surgeon: Toledo, Ladell POUR, MD;  Location: [facility];  Service: Gastroenterology;  Laterality: N/A;   FLEXIBLE SIGMOIDOSCOPY N/A 01/19/2022   Procedure: FLEXIBLE SIGMOIDOSCOPY;  Surgeon: Maryruth Ole DASEN, MD;  Location: [facility];  Service: Endoscopy;  Laterality: N/A;   FRACTURE SURGERY Left    arm rod and screw    IR RADIOLOGIST EVAL & MGMT  03/04/2022   KNEE ARTHROSCOPY     LAPAROTOMY N/A 01/24/2022   Procedure: EXPLORATORY LAPAROTOMY;  Surgeon: Rodolph Romano, MD;  Location: [facility];  Service: General;  Laterality: N/A;   OOPHORECTOMY     RCR     ROTATOR CUFF REPAIR Left 2006    Social History   Socioeconomic History   Marital status: Married    Spouse name: Not on file   Number of children: Not on file   Years of education: Not on file   Highest education level: Not on file  Occupational History   Not on file  Tobacco Use   Smoking status: Never   Smokeless tobacco: Never  Substance and Sexual Activity   Alcohol  use: No   Drug use: Never   Sexual activity: Not Currently  Other Topics Concern   Not on file  Social History Narrative   Not on file   Social Drivers of Health   Financial Resource Strain: Low Risk  (11/18/2023)   Received from [facility]   Overall Financial Resource Strain (CARDIA)    Difficulty of Paying Living Expenses: Not hard at all  Food Insecurity: No Food Insecurity (11/18/2023)   Received from [facility]   Hunger Vital Sign    Within the past 12 months, you worried that your food would run out before you got the money to buy more.: Never true    Within the past 12 months, the food you bought just didn't last and you didn't have money to get more.: Never true  Transportation Needs: No Transportation Needs (11/18/2023)   Received from [facility] - Transportation    In the past 12 months, has lack of transportation kept you from medical appointments or from getting medications?: No    Lack of Transportation (Non-Medical): No  Physical Activity: Not on file  Stress: Not on file  Social Connections: Not on file  Intimate Partner Violence: Not At Risk (03/15/2022)   Humiliation, Afraid, Rape, and Kick questionnaire    Fear of Current or Ex-Partner: No    Emotionally Abused: No     Physically Abused: No    Sexually Abused: No    Family History  Problem Relation Age of Onset   Emphysema Mother    Heart Problems Mother    Tuberculosis Father    Brain cancer Father    Skin cancer Father    Breast cancer Sister    Irritable bowel syndrome Sister    Lung cancer Brother    Dementia Sister    Schizophrenia Sister      Current Outpatient Medications:    acetaminophen  (TYLENOL ) 500 MG tablet, Take 1,000 mg by mouth daily as needed for pain., Disp: , Rfl:    albuterol  (VENTOLIN  HFA) 108 (90 Base) MCG/ACT inhaler, Inhale 2 puffs into the lungs., Disp: , Rfl:    apixaban  (ELIQUIS ) 5 MG TABS tablet, Take 5 mg by mouth 2 (two) times daily., Disp: , Rfl:    ascorbic  acid (VITAMIN C ) 500 MG tablet, Take 1 tablet (500 mg total) by mouth 2 (two) times daily., Disp: 60 tablet, Rfl: 1   aspirin  EC 81 MG tablet, Take 81 mg by mouth daily., Disp: , Rfl:    azelastine (ASTELIN) 0.1 % nasal spray, Place into both nostrils., Disp: , Rfl:    budesonide-formoterol  (SYMBICORT) 160-4.5 MCG/ACT inhaler, Inhale 2 puffs into the lungs 2 (two) times daily., Disp: , Rfl:    calcium  carbonate (CALCIUM  ANTACID) 500 MG chewable tablet, Chew 2 tablets by mouth daily., Disp: , Rfl:    Cholecalciferol  (VITAMIN D  PO), Take 5,000 Units by mouth daily., Disp: , Rfl:    Cyanocobalamin  (VITAMIN B-12 PO), Take 1,000 mcg by mouth daily., Disp: , Rfl:    feeding supplement (ENSURE ENLIVE / ENSURE PLUS) LIQD, Take 237 mLs by mouth 3 (three) times daily between meals., Disp: 237 mL, Rfl: 12   fluticasone  (FLONASE ) 50 MCG/ACT nasal spray, Place 2 sprays into both nostrils at bedtime., Disp: , Rfl:    furosemide  (LASIX ) 20 MG tablet, Take 20 mg by mouth daily., Disp: , Rfl:    leptospermum manuka honey (MEDIHONEY) PSTE paste, Apply Medihoney to sacrum wound Q day, then cover with foam dressing.  Change foam dressing Q 3 days or PRN soiling Apply thin layer (3 mm) to wound., Disp: 44 mL, Rfl: 1   loperamide   (IMODIUM  A-D) 2 MG tablet, Take 4 mg by mouth 2 (two) times daily., Disp: , Rfl:    metoprolol  tartrate (LOPRESSOR ) 25 MG tablet, Take 1 tablet (25 mg total) by mouth 2 (two) times daily., Disp: 60 tablet, Rfl: 0   montelukast  (SINGULAIR ) 10 MG tablet, Take 10 mg by mouth at bedtime. , Disp: , Rfl:    Multiple Vitamins-Minerals (PRESERVISION AREDS PO), Take 1 capsule by mouth 2 (two) times daily., Disp: , Rfl:    omeprazole (PRILOSEC) 20 MG capsule, Take 20 mg by mouth 2 (two) times daily before a meal., Disp: , Rfl:    Polyethyl Glycol-Propyl Glycol (SYSTANE) 0.4-0.3 % SOLN, Apply 1 drop to eye 2 (two) times daily., Disp: , Rfl:    Sodium Chloride  Flush (SALINE FLUSH) 0.9 % SOLN, Use as directed, Disp: 10 mL, Rfl: 0   solifenacin (VESICARE) 10 MG tablet, Take 10 mg by mouth daily., Disp: , Rfl:    traMADol  (ULTRAM ) 50 MG tablet, Take 1 tablet (50 mg total) by mouth every 6 (six) hours as needed for moderate pain., Disp: 20 tablet, Rfl: 0  Physical exam:  Vitals:   01/24/24 1322  BP: (!) 95/51  Pulse: 75  Resp: 18  Temp: (!) 97 F (36.1 C)  TempSrc: Tympanic  SpO2: 100%  Weight: 164 lb (74.4 kg)  Height: 4' 9 (1.448 m)   Physical Exam Cardiovascular:     Rate and Rhythm: Normal rate and regular rhythm.     Heart sounds: Normal heart sounds.  Pulmonary:     Effort: Pulmonary effort is normal.     Breath sounds: Normal breath sounds.  Abdominal:     General: Bowel sounds are normal.     Palpations: Abdomen is soft.  Skin:    General: Skin is warm and dry.  Neurological:     Mental Status: She is alert and oriented to person, place, and time.      I have personally reviewed labs listed below:    Latest Ref Rng & Units 03/17/2022    4:01 AM  CMP  Glucose 70 - 99  mg/dL 886   BUN 8 - 23 mg/dL <5   Creatinine 9.55 - 1.00 mg/dL 9.51   Sodium 864 - 854 mmol/L 130   Potassium 3.5 - 5.1 mmol/L 3.4   Chloride 98 - 111 mmol/L 104   CO2 22 - 32 mmol/L 21   Calcium  8.9 - 10.3  mg/dL 8.1       Latest Ref Rng & Units 03/17/2022    4:01 AM  CBC  WBC 4.0 - 10.5 K/uL 10.9   Hemoglobin 12.0 - 15.0 g/dL 7.3   Hematocrit 63.9 - 46.0 % 22.3   Platelets 150 - 400 K/uL 334     Assessment and plan- Patient is a 83 y.o. female with history of presumed stage I lung cancer involving the right upper lobe status post SBRT in January 2021 currently in remission here for routine follow-up  I have reviewed CT chest images independently and discussed findings with the patient which does not show any evidence of recurrent or progressive disease.  She has stable bilateral pulmonary nodules which are subcentimeter and have not changed over the last 1 year.  I will continue to see her on a yearly basis   Visit Diagnosis 1. Encounter for follow-up surveillance of lung cancer      Dr. Annah Skene, MD, MPH [facility] [phone] 01/28/2024 8:15 PM

## 2024-02-07 DIAGNOSIS — N1831 Chronic kidney disease, stage 3a: Secondary | ICD-10-CM | POA: Diagnosis not present

## 2024-02-07 DIAGNOSIS — E66812 Obesity, class 2: Secondary | ICD-10-CM | POA: Diagnosis not present

## 2024-02-07 DIAGNOSIS — E7800 Pure hypercholesterolemia, unspecified: Secondary | ICD-10-CM | POA: Diagnosis not present

## 2024-02-07 DIAGNOSIS — Z6835 Body mass index (BMI) 35.0-35.9, adult: Secondary | ICD-10-CM | POA: Diagnosis not present

## 2024-02-07 DIAGNOSIS — I890 Lymphedema, not elsewhere classified: Secondary | ICD-10-CM | POA: Diagnosis not present

## 2024-02-07 DIAGNOSIS — Z79899 Other long term (current) drug therapy: Secondary | ICD-10-CM | POA: Diagnosis not present

## 2024-02-07 DIAGNOSIS — I272 Pulmonary hypertension, unspecified: Secondary | ICD-10-CM | POA: Diagnosis not present

## 2024-02-07 DIAGNOSIS — J4520 Mild intermittent asthma, uncomplicated: Secondary | ICD-10-CM | POA: Diagnosis not present

## 2024-02-07 DIAGNOSIS — M810 Age-related osteoporosis without current pathological fracture: Secondary | ICD-10-CM | POA: Diagnosis not present

## 2024-02-13 DIAGNOSIS — I10 Essential (primary) hypertension: Secondary | ICD-10-CM | POA: Diagnosis not present

## 2024-02-13 DIAGNOSIS — Z01818 Encounter for other preprocedural examination: Secondary | ICD-10-CM | POA: Diagnosis not present

## 2024-02-13 DIAGNOSIS — K219 Gastro-esophageal reflux disease without esophagitis: Secondary | ICD-10-CM | POA: Diagnosis not present

## 2024-02-13 DIAGNOSIS — E7800 Pure hypercholesterolemia, unspecified: Secondary | ICD-10-CM | POA: Diagnosis not present

## 2024-02-13 DIAGNOSIS — I890 Lymphedema, not elsewhere classified: Secondary | ICD-10-CM | POA: Diagnosis not present

## 2024-02-13 DIAGNOSIS — N1831 Chronic kidney disease, stage 3a: Secondary | ICD-10-CM | POA: Diagnosis not present

## 2024-02-13 DIAGNOSIS — E66812 Obesity, class 2: Secondary | ICD-10-CM | POA: Diagnosis not present

## 2024-02-13 DIAGNOSIS — I272 Pulmonary hypertension, unspecified: Secondary | ICD-10-CM | POA: Diagnosis not present

## 2024-02-13 DIAGNOSIS — R55 Syncope and collapse: Secondary | ICD-10-CM | POA: Diagnosis not present

## 2024-02-15 DIAGNOSIS — K631 Perforation of intestine (nontraumatic): Secondary | ICD-10-CM | POA: Diagnosis not present

## 2024-02-15 DIAGNOSIS — Z932 Ileostomy status: Secondary | ICD-10-CM | POA: Diagnosis not present

## 2024-02-15 DIAGNOSIS — K56609 Unspecified intestinal obstruction, unspecified as to partial versus complete obstruction: Secondary | ICD-10-CM | POA: Diagnosis not present

## 2024-02-15 DIAGNOSIS — K5732 Diverticulitis of large intestine without perforation or abscess without bleeding: Secondary | ICD-10-CM | POA: Diagnosis not present

## 2024-02-20 ENCOUNTER — Encounter: Payer: Self-pay | Admitting: Internal Medicine

## 2024-02-20 ENCOUNTER — Ambulatory Visit
Admission: RE | Admit: 2024-02-20 | Discharge: 2024-02-20 | Disposition: A | Discharge status: Home / Self Care | Attending: Internal Medicine | Admitting: Internal Medicine

## 2024-02-20 ENCOUNTER — Other Ambulatory Visit: Payer: Self-pay

## 2024-02-20 ENCOUNTER — Encounter
Admission: RE | Disposition: A | Discharge status: Home / Self Care | Payer: Self-pay | Source: Home / Self Care | Attending: Internal Medicine

## 2024-02-20 DIAGNOSIS — N1831 Chronic kidney disease, stage 3a: Secondary | ICD-10-CM | POA: Diagnosis not present

## 2024-02-20 DIAGNOSIS — I890 Lymphedema, not elsewhere classified: Secondary | ICD-10-CM | POA: Insufficient documentation

## 2024-02-20 DIAGNOSIS — I129 Hypertensive chronic kidney disease with stage 1 through stage 4 chronic kidney disease, or unspecified chronic kidney disease: Secondary | ICD-10-CM | POA: Diagnosis not present

## 2024-02-20 DIAGNOSIS — Z79899 Other long term (current) drug therapy: Secondary | ICD-10-CM | POA: Diagnosis not present

## 2024-02-20 DIAGNOSIS — Z6835 Body mass index (BMI) 35.0-35.9, adult: Secondary | ICD-10-CM | POA: Insufficient documentation

## 2024-02-20 DIAGNOSIS — K219 Gastro-esophageal reflux disease without esophagitis: Secondary | ICD-10-CM | POA: Diagnosis not present

## 2024-02-20 DIAGNOSIS — R55 Syncope and collapse: Secondary | ICD-10-CM | POA: Diagnosis not present

## 2024-02-20 DIAGNOSIS — E7800 Pure hypercholesterolemia, unspecified: Secondary | ICD-10-CM | POA: Insufficient documentation

## 2024-02-20 DIAGNOSIS — E66812 Obesity, class 2: Secondary | ICD-10-CM | POA: Insufficient documentation

## 2024-02-20 DIAGNOSIS — I272 Pulmonary hypertension, unspecified: Secondary | ICD-10-CM | POA: Insufficient documentation

## 2024-02-20 HISTORY — PX: RIGHT HEART CATH: CATH118263

## 2024-02-20 LAB — POCT I-STAT EG7
Acid-base deficit: 2 mmol/L (ref 0.0–2.0)
Bicarbonate: 23.1 mmol/L (ref 20.0–28.0)
Calcium, Ion: 1.22 mmol/L (ref 1.15–1.40)
HCT: 31 % — ABNORMAL LOW (ref 36.0–46.0)
Hemoglobin: 10.5 g/dL — ABNORMAL LOW (ref 12.0–15.0)
O2 Saturation: 74 %
Potassium: 3.9 mmol/L (ref 3.5–5.1)
Sodium: 134 mmol/L — ABNORMAL LOW (ref 135–145)
TCO2: 24 mmol/L (ref 22–32)
pCO2, Ven: 40.6 mmHg — ABNORMAL LOW (ref 44–60)
pH, Ven: 7.364 (ref 7.25–7.43)
pO2, Ven: 41 mmHg (ref 32–45)

## 2024-02-20 SURGERY — RIGHT HEART CATH
Anesthesia: Moderate Sedation | Laterality: Right

## 2024-02-20 MED ORDER — FREE WATER: 500.0000 mL | Freq: Once | Status: DC

## 2024-02-20 MED ORDER — SODIUM CHLORIDE 0.9% FLUSH: 3.0000 mL | INTRAVENOUS | Status: DC | PRN

## 2024-02-20 MED ORDER — LIDOCAINE HCL (PF) 1 % IJ SOLN
INTRAMUSCULAR | Status: DC | PRN
  Administered 2024-02-20: 2 mL

## 2024-02-20 MED ORDER — SODIUM CHLORIDE 0.9 % IV SOLN
250.0000 mL | INTRAVENOUS | Status: DC | PRN
  Administered 2024-02-20: 250 mL via INTRAVENOUS

## 2024-02-20 MED ORDER — SODIUM CHLORIDE 0.9% FLUSH: 3.0000 mL | Freq: Two times a day (BID) | INTRAVENOUS | Status: DC

## 2024-02-20 MED ORDER — HEPARIN (PORCINE) IN NACL 1000-0.9 UT/500ML-% IV SOLN
INTRAVENOUS | Status: DC | PRN
  Administered 2024-02-20: 1000 mL

## 2024-02-20 SURGICAL SUPPLY — 6 items
CATH BALLN WEDGE 5F 110CM (CATHETERS) IMPLANT
DRAPE BRACHIAL (DRAPES) IMPLANT
PACK CARDIAC CATH (CUSTOM PROCEDURE TRAY) ×1 IMPLANT
SET ATX-X65L (MISCELLANEOUS) IMPLANT
SHEATH GLIDE SLENDER 4/5FR (SHEATH) IMPLANT
STATION PROTECTION PRESSURIZED (MISCELLANEOUS) IMPLANT

## 2024-02-20 NOTE — Discharge Instructions (Signed)
Right Heart Cath, Care After This sheet gives you information about how to care for yourself after your procedure. Your health care provider may also give you more specific instructions. If you have problems or questions, contact your health care provider. What can I expect after the procedure? After the procedure, it is common to have: Bruising or mild discomfort in the area where the IV was inserted (insertion site). Follow these instructions at home: Eating and drinking  You may eat and drink after your procedure.  Drink a lot of fluids for the first several days after the procedure, as directed by your health care provider. This helps to wash (flush) the contrast out of your body. Examples of healthy fluids include water or low-calorie drinks. General instructions Check your IV insertion area and also your venous access site every day for signs of infection. Check for: Redness, swelling, or pain. Fluid or blood. Warmth. Pus or a bad smell. Take over-the-counter and prescription medicines only as told by your health care provider. Rest and return to your normal activities as told by your health care provider. Ask your health care provider what activities are safe for you. Do not drive for 24 hours if you were given a medicine to help you relax (sedative), or until your health care provider approves. Keep all follow-up visits as told by your health care provider. This is important. Contact a health care provider if: Your skin becomes itchy or you develop a rash or hives. You have a fever that does not get better with medicine. You feel nauseous. You vomit. You have redness, swelling, or pain around the insertion site. You have fluid or blood coming from the insertion site. Your insertion area feels warm to the touch. You have pus or a bad smell coming from the insertion site. Get help right away if: You have difficulty breathing or shortness of breath. You develop chest pain. You  faint. You feel very dizzy. These symptoms may represent a serious problem that is an emergency. Do not wait to see if the symptoms will go away. Get medical help right away. Call your local emergency services (911 in the [place] Do not drive yourself to the hospital. Summary After your procedure, it is common to have bruising or mild discomfort in the area where the IV was inserted. You should check your IV insertion area every day for signs of infection. Take over-the-counter and prescription medicines only as told by your health care provider. You should drink a lot of fluids for the first several days after the procedure to help flush the contrast from your body. This information is not intended to replace advice given to you by your health care provider. Make sure you discuss any questions you have with your health care provider. Document Released: 03/14/2013 Document Revised: 05/06/2017 Document Reviewed: 04/17/2016 [employer] Patient Education  2020 [employer] 

## 2024-02-21 ENCOUNTER — Encounter: Payer: Self-pay | Admitting: Internal Medicine

## 2024-03-05 DIAGNOSIS — I272 Pulmonary hypertension, unspecified: Secondary | ICD-10-CM | POA: Diagnosis not present

## 2024-03-05 DIAGNOSIS — R55 Syncope and collapse: Secondary | ICD-10-CM | POA: Diagnosis not present

## 2024-03-05 DIAGNOSIS — E7800 Pure hypercholesterolemia, unspecified: Secondary | ICD-10-CM | POA: Diagnosis not present

## 2024-03-05 DIAGNOSIS — N1831 Chronic kidney disease, stage 3a: Secondary | ICD-10-CM | POA: Diagnosis not present

## 2024-03-05 DIAGNOSIS — I890 Lymphedema, not elsewhere classified: Secondary | ICD-10-CM | POA: Diagnosis not present

## 2024-03-05 DIAGNOSIS — K219 Gastro-esophageal reflux disease without esophagitis: Secondary | ICD-10-CM | POA: Diagnosis not present

## 2024-03-05 DIAGNOSIS — I10 Essential (primary) hypertension: Secondary | ICD-10-CM | POA: Diagnosis not present

## 2024-03-05 DIAGNOSIS — E66812 Obesity, class 2: Secondary | ICD-10-CM | POA: Diagnosis not present

## 2024-03-06 DIAGNOSIS — K5732 Diverticulitis of large intestine without perforation or abscess without bleeding: Secondary | ICD-10-CM | POA: Diagnosis not present

## 2024-03-06 DIAGNOSIS — K631 Perforation of intestine (nontraumatic): Secondary | ICD-10-CM | POA: Diagnosis not present

## 2024-03-06 DIAGNOSIS — K56609 Unspecified intestinal obstruction, unspecified as to partial versus complete obstruction: Secondary | ICD-10-CM | POA: Diagnosis not present

## 2024-03-06 DIAGNOSIS — Z932 Ileostomy status: Secondary | ICD-10-CM | POA: Diagnosis not present

## 2024-03-09 DIAGNOSIS — Z79899 Other long term (current) drug therapy: Secondary | ICD-10-CM | POA: Diagnosis not present

## 2024-04-05 DIAGNOSIS — Z23 Encounter for immunization: Secondary | ICD-10-CM | POA: Diagnosis not present

## 2024-04-05 DIAGNOSIS — R0609 Other forms of dyspnea: Secondary | ICD-10-CM | POA: Diagnosis not present

## 2024-04-05 DIAGNOSIS — J4541 Moderate persistent asthma with (acute) exacerbation: Secondary | ICD-10-CM | POA: Diagnosis not present

## 2024-04-05 DIAGNOSIS — J4540 Moderate persistent asthma, uncomplicated: Secondary | ICD-10-CM | POA: Diagnosis not present

## 2024-04-05 DIAGNOSIS — I890 Lymphedema, not elsewhere classified: Secondary | ICD-10-CM | POA: Diagnosis not present

## 2024-04-05 DIAGNOSIS — Z85118 Personal history of other malignant neoplasm of bronchus and lung: Secondary | ICD-10-CM | POA: Diagnosis not present

## 2024-04-05 DIAGNOSIS — I272 Pulmonary hypertension, unspecified: Secondary | ICD-10-CM | POA: Diagnosis not present

## 2024-04-05 DIAGNOSIS — J984 Other disorders of lung: Secondary | ICD-10-CM | POA: Diagnosis not present

## 2024-04-19 DIAGNOSIS — K631 Perforation of intestine (nontraumatic): Secondary | ICD-10-CM | POA: Diagnosis not present

## 2024-04-19 DIAGNOSIS — Z932 Ileostomy status: Secondary | ICD-10-CM | POA: Diagnosis not present

## 2024-04-19 DIAGNOSIS — K56609 Unspecified intestinal obstruction, unspecified as to partial versus complete obstruction: Secondary | ICD-10-CM | POA: Diagnosis not present

## 2024-04-19 DIAGNOSIS — K5732 Diverticulitis of large intestine without perforation or abscess without bleeding: Secondary | ICD-10-CM | POA: Diagnosis not present

## 2024-04-20 DIAGNOSIS — M19011 Primary osteoarthritis, right shoulder: Secondary | ICD-10-CM | POA: Diagnosis not present

## 2024-04-20 DIAGNOSIS — M7521 Bicipital tendinitis, right shoulder: Secondary | ICD-10-CM | POA: Diagnosis not present

## 2024-04-20 DIAGNOSIS — M7581 Other shoulder lesions, right shoulder: Secondary | ICD-10-CM | POA: Diagnosis not present

## 2024-05-07 DIAGNOSIS — L089 Local infection of the skin and subcutaneous tissue, unspecified: Secondary | ICD-10-CM | POA: Diagnosis not present

## 2024-05-07 DIAGNOSIS — Z933 Colostomy status: Secondary | ICD-10-CM | POA: Diagnosis not present

## 2024-05-07 DIAGNOSIS — T148XXA Other injury of unspecified body region, initial encounter: Secondary | ICD-10-CM | POA: Diagnosis not present

## 2024-05-09 DIAGNOSIS — M810 Age-related osteoporosis without current pathological fracture: Secondary | ICD-10-CM | POA: Diagnosis not present

## 2024-05-09 DIAGNOSIS — I50812 Chronic right heart failure: Secondary | ICD-10-CM | POA: Diagnosis not present

## 2024-05-10 DIAGNOSIS — K5732 Diverticulitis of large intestine without perforation or abscess without bleeding: Secondary | ICD-10-CM | POA: Diagnosis not present

## 2024-05-10 DIAGNOSIS — K631 Perforation of intestine (nontraumatic): Secondary | ICD-10-CM | POA: Diagnosis not present

## 2024-05-10 DIAGNOSIS — K56609 Unspecified intestinal obstruction, unspecified as to partial versus complete obstruction: Secondary | ICD-10-CM | POA: Diagnosis not present

## 2025-01-22 ENCOUNTER — Ambulatory Visit: Admitting: Oncology

## 2025-01-23 ENCOUNTER — Other Ambulatory Visit
